# Patient Record
Sex: Male | Born: 1950 | Race: White | Hispanic: No | Smoking: Former smoker
Health system: Southern US, Community
[De-identification: ages and names within clinical notes are randomized; demographics above are authoritative.]

## PROBLEM LIST (undated history)

## (undated) DIAGNOSIS — F419 Anxiety disorder, unspecified: Secondary | ICD-10-CM

## (undated) DIAGNOSIS — M069 Rheumatoid arthritis, unspecified: Secondary | ICD-10-CM

## (undated) DIAGNOSIS — M5136 Other intervertebral disc degeneration, lumbar region: Secondary | ICD-10-CM

## (undated) DIAGNOSIS — E7800 Pure hypercholesterolemia, unspecified: Secondary | ICD-10-CM

## (undated) DIAGNOSIS — M170 Bilateral primary osteoarthritis of knee: Secondary | ICD-10-CM

## (undated) DIAGNOSIS — I4819 Other persistent atrial fibrillation: Secondary | ICD-10-CM

## (undated) DIAGNOSIS — C632 Malignant neoplasm of scrotum: Secondary | ICD-10-CM

## (undated) DIAGNOSIS — G4730 Sleep apnea, unspecified: Secondary | ICD-10-CM

## (undated) DIAGNOSIS — M19041 Primary osteoarthritis, right hand: Secondary | ICD-10-CM

## (undated) DIAGNOSIS — I10 Essential (primary) hypertension: Secondary | ICD-10-CM

## (undated) DIAGNOSIS — M19042 Primary osteoarthritis, left hand: Secondary | ICD-10-CM

## (undated) DIAGNOSIS — G4700 Insomnia, unspecified: Secondary | ICD-10-CM

## (undated) DIAGNOSIS — I482 Chronic atrial fibrillation, unspecified: Secondary | ICD-10-CM

## (undated) DIAGNOSIS — I4892 Unspecified atrial flutter: Secondary | ICD-10-CM

## (undated) HISTORY — DX: Primary osteoarthritis, right hand: M19.041

## (undated) HISTORY — DX: Insomnia, unspecified: G47.00

## (undated) HISTORY — DX: Pure hypercholesterolemia, unspecified: E78.00

## (undated) HISTORY — DX: Primary osteoarthritis, left hand: M19.042

## (undated) HISTORY — DX: Unspecified atrial flutter: I48.92

## (undated) HISTORY — PX: A-FLUTTER ABLATION: EP1230

## (undated) HISTORY — DX: Malignant neoplasm of scrotum: C63.2

## (undated) HISTORY — DX: Bilateral primary osteoarthritis of knee: M17.0

## (undated) HISTORY — DX: Anxiety disorder, unspecified: F41.9

## (undated) HISTORY — DX: Essential (primary) hypertension: I10

## (undated) HISTORY — DX: Sleep apnea, unspecified: G47.30

## (undated) HISTORY — DX: Other persistent atrial fibrillation: I48.19

## (undated) HISTORY — PX: CATARACT EXTRACTION: SUR2

## (undated) HISTORY — DX: Other intervertebral disc degeneration, lumbar region: M51.36

## (undated) HISTORY — DX: Rheumatoid arthritis, unspecified: M06.9

---

## 2016-01-25 DIAGNOSIS — I4892 Unspecified atrial flutter: Secondary | ICD-10-CM | POA: Insufficient documentation

## 2016-01-30 DIAGNOSIS — I517 Cardiomegaly: Secondary | ICD-10-CM | POA: Insufficient documentation

## 2016-01-30 DIAGNOSIS — E65 Localized adiposity: Secondary | ICD-10-CM | POA: Insufficient documentation

## 2016-02-05 ENCOUNTER — Telehealth: Payer: Self-pay | Admitting: Rheumatology

## 2016-02-05 NOTE — Telephone Encounter (Signed)
Pt needs a refill of Hydrocodone and would like to pick up tomorrow, 02/06/2016

## 2016-02-06 MED ORDER — HYDROCODONE-ACETAMINOPHEN 5-325 MG PO TABS: 1.0000 | ORAL_TABLET | Freq: Two times a day (BID) | ORAL | 0 refills | Status: DC | PRN

## 2016-02-06 NOTE — Telephone Encounter (Signed)
Last Visit: 08/14/15 Next Visit: 02/14/16  UDS:01/14/16 +hydrocodone, and benzos Narc Agreement: 09/03/15  Okay to refill Hydrocodone ?

## 2016-02-06 NOTE — Telephone Encounter (Signed)
Advised ready for pick up.

## 2016-02-06 NOTE — Telephone Encounter (Signed)
ok to refill Hydrodocone

## 2016-02-13 ENCOUNTER — Encounter: Payer: Self-pay | Admitting: *Deleted

## 2016-02-13 DIAGNOSIS — M170 Bilateral primary osteoarthritis of knee: Secondary | ICD-10-CM | POA: Insufficient documentation

## 2016-02-13 DIAGNOSIS — G4730 Sleep apnea, unspecified: Secondary | ICD-10-CM

## 2016-02-13 DIAGNOSIS — I10 Essential (primary) hypertension: Secondary | ICD-10-CM | POA: Insufficient documentation

## 2016-02-13 DIAGNOSIS — I4892 Unspecified atrial flutter: Secondary | ICD-10-CM

## 2016-02-13 DIAGNOSIS — M19042 Primary osteoarthritis, left hand: Secondary | ICD-10-CM

## 2016-02-13 DIAGNOSIS — C632 Malignant neoplasm of scrotum: Secondary | ICD-10-CM

## 2016-02-13 DIAGNOSIS — E782 Mixed hyperlipidemia: Secondary | ICD-10-CM

## 2016-02-13 DIAGNOSIS — M069 Rheumatoid arthritis, unspecified: Secondary | ICD-10-CM

## 2016-02-13 DIAGNOSIS — G4733 Obstructive sleep apnea (adult) (pediatric): Secondary | ICD-10-CM | POA: Insufficient documentation

## 2016-02-13 DIAGNOSIS — M51369 Other intervertebral disc degeneration, lumbar region without mention of lumbar back pain or lower extremity pain: Secondary | ICD-10-CM

## 2016-02-13 DIAGNOSIS — F419 Anxiety disorder, unspecified: Secondary | ICD-10-CM

## 2016-02-13 DIAGNOSIS — M19041 Primary osteoarthritis, right hand: Secondary | ICD-10-CM

## 2016-02-13 DIAGNOSIS — G4700 Insomnia, unspecified: Secondary | ICD-10-CM | POA: Insufficient documentation

## 2016-02-13 DIAGNOSIS — M5136 Other intervertebral disc degeneration, lumbar region: Secondary | ICD-10-CM

## 2016-02-13 DIAGNOSIS — I499 Cardiac arrhythmia, unspecified: Secondary | ICD-10-CM | POA: Insufficient documentation

## 2016-02-13 DIAGNOSIS — E7800 Pure hypercholesterolemia, unspecified: Secondary | ICD-10-CM

## 2016-02-13 HISTORY — DX: Sleep apnea, unspecified: G47.30

## 2016-02-13 HISTORY — DX: Rheumatoid arthritis, unspecified: M06.9

## 2016-02-13 HISTORY — DX: Malignant neoplasm of scrotum: C63.2

## 2016-02-13 HISTORY — DX: Mixed hyperlipidemia: E78.2

## 2016-02-13 HISTORY — DX: Other intervertebral disc degeneration, lumbar region: M51.36

## 2016-02-13 HISTORY — DX: Primary osteoarthritis, right hand: M19.041

## 2016-02-13 HISTORY — DX: Unspecified atrial flutter: I48.92

## 2016-02-13 HISTORY — DX: Insomnia, unspecified: G47.00

## 2016-02-13 HISTORY — DX: Anxiety disorder, unspecified: F41.9

## 2016-02-13 HISTORY — DX: Essential (primary) hypertension: I10

## 2016-02-13 HISTORY — DX: Other intervertebral disc degeneration, lumbar region without mention of lumbar back pain or lower extremity pain: M51.369

## 2016-02-13 NOTE — Progress Notes (Deleted)
*IMAGE* Office Visit Note  Patient: Jesus Rodriguez             Date of Birth: 1950/12/02           MRN: [id number]             PCP: No primary care provider on file. Referring: No ref. provider found Visit Date: 02/14/2016 Occupation:@GUAROCC @    Subjective:  No chief complaint on file. Follow-up on rheumatoid arthritis Osteoarthritis of the knee joint Smoking cessation Obesity   History of Present Illness: Jesus Rodriguez is a 65 y.o. male last seen in our office on 08/14/2015. On that visit his rheumatoid arthritis which was seronegative was well controlled without any DMARD's. He had to stop methotrexate because he was having arrhythmia with the medication. Patient is unable to use sulfasalazine because of allergies to sulfa. Despite this he had no synovitis. No joint pain to the hand or wrist joint. He also has history of osteoarthritis of the knee joint and we had given him Hyalgan that finished 12/15/2014 and was doing well. His knee pain was rated between 5-8 on a scale of 0-10 . Patient was interested in having a repeat series and so we gave him another series of injections in summer fall of 2017 and he did well with those. We also discussed as usual smoking cessation. Patient is trying. He is not quite successful.  Today the patient states***  Activities of Daily Living:  Patient reports morning stiffness for 15 minutes.   Patient Denies nocturnal pain.  Difficulty dressing/grooming: Denies Difficulty climbing stairs: Denies Difficulty getting out of chair: Reports Difficulty using hands for taps, buttons, cutlery, and/or writing: Denies   Review of Systems  Constitutional: Negative for fatigue.  HENT: Negative for mouth sores and mouth dryness.   Eyes: Negative for dryness.  Respiratory: Negative for shortness of breath.   Gastrointestinal: Negative for constipation and diarrhea.  Musculoskeletal: Negative for myalgias and myalgias.  Skin: Negative for sensitivity to  sunlight.  Neurological: Negative for memory loss.  Psychiatric/Behavioral: Negative for sleep disturbance.    PMFS History:  Patient Active Problem List   Diagnosis Date Noted  . High risk medications (not anticoagulants) long-term use 02/14/2016  . Class 3 obesity in adult 02/14/2016  . Depression 02/14/2016  . RA (rheumatoid arthritis) ([place]) 02/13/2016  . Osteoarthritis of both hands 02/13/2016  . Osteoarthritis of both knees 02/13/2016  . DDD (degenerative disc disease), lumbar 02/13/2016  . 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 Hypertension 02/13/2016  . Elevated cholesterol 02/13/2016  . Insomnia 02/13/2016  . Anxiety 02/13/2016  . Sleep apnea 02/13/2016  . Arrhythmia 02/13/2016    Past Medical History:  Diagnosis Date  . Anxiety 02/13/2016  . Arrhythmia 02/13/2016  . DDD (degenerative disc disease), lumbar 02/13/2016  . Elevated cholesterol 02/13/2016  . Hypertension 02/13/2016  . Insomnia 02/13/2016  . Osteoarthritis of both hands 02/13/2016  . Osteoarthritis of both knees 02/13/2016  . RA (rheumatoid arthritis) ([place]) 02/13/2016   Sero Negative  . Scrotal cancer ([place] Rodriguez) 02/13/2016  . Sleep apnea 02/13/2016    No family history on file. No past surgical history on file. Social History   Social History Narrative  . No narrative on file     Objective: Vital Signs: There were no vitals taken for this visit.   Physical Exam  Constitutional: He is oriented to person, place, and time. He appears well-developed and well-nourished.  HENT:  Head: Normocephalic and atraumatic.  Eyes: Conjunctivae and EOM are  normal. Pupils are equal, round, and reactive to light.  Neck: Normal range of motion. Neck supple.  Cardiovascular: Normal rate, regular rhythm and normal heart sounds.  Exam reveals no gallop and no friction rub.   No murmur heard. Pulmonary/Chest: Effort normal and breath sounds normal. No respiratory distress. He has no wheezes. He has no rales. He exhibits no  tenderness.  Abdominal: Soft. He exhibits no distension and no mass. There is no tenderness. There is no guarding.  Musculoskeletal: Normal range of motion.  Lymphadenopathy:    He has no cervical adenopathy.  Neurological: He is alert and oriented to person, place, and time. He exhibits normal muscle tone. Coordination normal.  Skin: Skin is warm and dry. Capillary refill takes less than 2 seconds. No rash noted.  Psychiatric: He has a normal mood and affect. His behavior is normal. Judgment and thought content normal.  Nursing note and vitals reviewed.    Musculoskeletal Exam:  Full range of motion of all joints Grip strength is equal and strong bilaterally Fibromyalgia tender points are all absent  CDAI Exam: No CDAI exam completed.  NO SYNOVITIS ON EXAM. No joint pain no joint swelling no joint stiffness. Continues to do well with his seronegative rheumatoid arthritis despite not being on any DMARD's.  He cannot take methotrexate due to history of arrhythmia with methotrexate. He cannot take sulfa since he has sulfa allergies. 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 Consider biologic in the future that includes Humira as an option should the need arise.  Investigation: No additional findings.   Imaging: No results found.  Speciality Comments: No specialty comments available.    Procedures:  No procedures performed Allergies: Celebrex [celecoxib]; Methotrexate derivatives; and Sulfa antibiotics   Assessment / Plan: Visit Diagnoses: Rheumatoid arthritis, involving unspecified site, unspecified rheumatoid factor presence ([place]) - Sero NegativeErosive  High risk medications (not anticoagulants) long-term use  Osteoarthritis of both knees, unspecified osteoarthritis type - Responded well to Hyalgan in Summer / Fall 2017 and previous years  Class 3 obesity in adult, unspecified BMI, unspecified obesity type, unspecified whether serious comorbidity  present  Sleep apnea, unspecified type  DDD (degenerative disc disease), lumbar  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 He does take hydrocodone on a regular basis that we prescribed. He has decreased his hydrocodone use based on our suggestion and I'm currently satisfied with his current dose of hydrocodone  Patient does not need any DMARD's at this time. His rheumatoid arthritis is doing well.  It will be time for repeat series of [place] and we will apply when appropriate.  We discussed again to continue the efforts for smoking cessation. If initially smokes a few cigarettes less overall between this visit at next visit that will be something positive.  He continues to make his efforts for decreasing his weight. I've encouraged him to continue weight loss efforts through proper nutrition proper exercise. Giving the knee injections and making his needs more comfortable is allowing him more mobility and allowing him more calorie burning activities secondary to mobility.  Orders: No orders of the defined types were placed in this encounter.  No orders of the defined types were placed in this encounter.   Face-to-face time spent with patient was 30 minutes. 50% of time was spent in counseling and coordination of care.  Follow-Up Instructions: No Follow-up on file.

## 2016-02-14 ENCOUNTER — Ambulatory Visit: Payer: Self-pay | Admitting: Rheumatology

## 2016-02-14 DIAGNOSIS — Z79899 Other long term (current) drug therapy: Secondary | ICD-10-CM | POA: Insufficient documentation

## 2016-02-14 DIAGNOSIS — F32A Depression, unspecified: Secondary | ICD-10-CM | POA: Insufficient documentation

## 2016-02-14 DIAGNOSIS — IMO0001 Reserved for inherently not codable concepts without codable children: Secondary | ICD-10-CM | POA: Insufficient documentation

## 2016-02-14 DIAGNOSIS — F329 Major depressive disorder, single episode, unspecified: Secondary | ICD-10-CM | POA: Insufficient documentation

## 2016-03-10 ENCOUNTER — Other Ambulatory Visit: Payer: Self-pay | Admitting: Rheumatology

## 2016-03-10 NOTE — Telephone Encounter (Signed)
What happened to his appt that was sch with [place] on 02/14/16?

## 2016-03-10 NOTE — Telephone Encounter (Signed)
Patient left message requesting refill of hydrocodone

## 2016-03-10 NOTE — Telephone Encounter (Signed)
Last Visit: 08/14/15 Next Visit: was sch on 02/14/16 and no showed this, he will need follow up appt  UDS:01/14/16 +hydrocodone, and benzos Narc Agreement: 09/03/15  Will have front desk call to make appt

## 2016-03-11 NOTE — Telephone Encounter (Signed)
Patient now has scheduled appointment for 03/10/16.

## 2016-03-12 MED ORDER — HYDROCODONE-ACETAMINOPHEN 5-325 MG PO TABS: 1.0000 | ORAL_TABLET | Freq: Two times a day (BID) | ORAL | 0 refills | Status: DC | PRN

## 2016-03-12 NOTE — Telephone Encounter (Signed)
Ok to refill Hydrocodone? He made appt for 04/09/16

## 2016-03-18 DIAGNOSIS — Z7901 Long term (current) use of anticoagulants: Secondary | ICD-10-CM | POA: Insufficient documentation

## 2016-04-09 ENCOUNTER — Ambulatory Visit: Payer: Self-pay | Admitting: Rheumatology

## 2016-04-11 DIAGNOSIS — I483 Typical atrial flutter: Secondary | ICD-10-CM | POA: Insufficient documentation

## 2016-04-17 ENCOUNTER — Encounter: Payer: Self-pay | Admitting: Rheumatology

## 2016-04-17 ENCOUNTER — Ambulatory Visit (INDEPENDENT_AMBULATORY_CARE_PROVIDER_SITE_OTHER): Payer: Medicare Other | Admitting: Rheumatology

## 2016-04-17 VITALS — BP 130/70 | HR 72 | Resp 18 | Ht 72.5 in | Wt 373.0 lb

## 2016-04-17 DIAGNOSIS — E669 Obesity, unspecified: Secondary | ICD-10-CM | POA: Diagnosis not present

## 2016-04-17 DIAGNOSIS — IMO0001 Reserved for inherently not codable concepts without codable children: Secondary | ICD-10-CM

## 2016-04-17 DIAGNOSIS — G8929 Other chronic pain: Secondary | ICD-10-CM | POA: Diagnosis not present

## 2016-04-17 DIAGNOSIS — Z7189 Other specified counseling: Secondary | ICD-10-CM | POA: Diagnosis not present

## 2016-04-17 DIAGNOSIS — M25562 Pain in left knee: Secondary | ICD-10-CM

## 2016-04-17 DIAGNOSIS — Z6841 Body Mass Index (BMI) 40.0 and over, adult: Secondary | ICD-10-CM | POA: Diagnosis not present

## 2016-04-17 DIAGNOSIS — M0609 Rheumatoid arthritis without rheumatoid factor, multiple sites: Secondary | ICD-10-CM

## 2016-04-17 DIAGNOSIS — M25561 Pain in right knee: Secondary | ICD-10-CM | POA: Diagnosis not present

## 2016-04-17 DIAGNOSIS — Z79899 Other long term (current) drug therapy: Secondary | ICD-10-CM

## 2016-04-17 MED ORDER — HYDROCODONE-ACETAMINOPHEN 5-325 MG PO TABS: 1.0000 | ORAL_TABLET | Freq: Two times a day (BID) | ORAL | 0 refills | Status: DC | PRN

## 2016-04-17 NOTE — Progress Notes (Signed)
Office Visit Note  Patient: Jesus Rodriguez             Date of Birth: November 12, 1950           MRN: [id number]             PCP: Pcp Not In System Referring: No ref. provider found Visit Date: 04/17/2016 Occupation: @GUAROCC @    Subjective:  No chief complaint on file. RA hx AND F-U  History of Present Illness: Jesus Rodriguez is a 66 y.o. male  Follow-up on rheumatoid arthritis. Patient is seronegative. Patient stop methotrexate because [facility] said that it can cause arrhythmia. Patient can't take sulfasalazine due to allergies to sulfa.  He's been doing well with his rheumatoid arthritis without any treatment.  Patient had heart issues and went to [facility] from his PCPs office for the following care starting 02/03/2016. And then multiple visits thereafter for the following: Most recently he had symptoms of feeling weak and heart rate went from about 50 on weight 150. He went to the hospital and they found that he had atrial flutter. They did cardioversion. He had 7 different cardioversions because he would not improve with the cardioversion treatments. He did end up doing A. fib at times when they were cardioverting him. He also underwent ablation  He's been put on 2 additional medications for his heart including sotalol. He is doing better now   Activities of Daily Living:  Patient reports morning stiffness for 30 minutes.   Patient Reports nocturnal pain.  Difficulty dressing/grooming: Denies Difficulty climbing stairs: Reports Difficulty getting out of chair: Reports Difficulty using hands for taps, buttons, cutlery, and/or writing: Reports   Review of Systems  Constitutional: Negative for fatigue.  HENT: Negative for mouth sores and mouth dryness.   Eyes: Negative for dryness.  Respiratory: Negative for shortness of breath.   Gastrointestinal: Negative for constipation and diarrhea.  Musculoskeletal: Negative for myalgias and myalgias.  Skin: Negative for  sensitivity to sunlight.  Neurological: Negative for memory loss.  Psychiatric/Behavioral: Negative for sleep disturbance.    PMFS History:  Patient Active Problem List   Diagnosis Date Noted  . High risk medications (not anticoagulants) long-term use 02/14/2016  . Class 3 obesity in adult 02/14/2016  . Depression 02/14/2016  . RA (rheumatoid arthritis) ([place]) 02/13/2016  . Osteoarthritis of both hands 02/13/2016  . Osteoarthritis of both knees 02/13/2016  . DDD (degenerative disc disease), lumbar 02/13/2016  . 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 Hypertension 02/13/2016  . Elevated cholesterol 02/13/2016  . Insomnia 02/13/2016  . Anxiety 02/13/2016  . Sleep apnea 02/13/2016  . Arrhythmia 02/13/2016    Past Medical History:  Diagnosis Date  . Anxiety 02/13/2016  . Arrhythmia 02/13/2016  . DDD (degenerative disc disease), lumbar 02/13/2016  . Elevated cholesterol 02/13/2016  . Hypertension 02/13/2016  . Insomnia 02/13/2016  . Osteoarthritis of both hands 02/13/2016  . Osteoarthritis of both knees 02/13/2016  . RA (rheumatoid arthritis) ([place]) 02/13/2016   Sero Negative  . 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 Sleep apnea 02/13/2016    No family history on file. No past surgical history on file. Social History   Social History Narrative  . No narrative on file     Objective: Vital Signs: BP 130/70   Pulse 72   Resp 18   Ht 6' 0.5" (1.842 m)   Wt (!) 373 lb (169.2 kg)   BMI 49.89 kg/m    Physical Exam  Constitutional: He is oriented to person,  place, and time. He appears well-developed and well-nourished.  HENT:  Head: Normocephalic and atraumatic.  Eyes: Conjunctivae and EOM are normal. Pupils are equal, round, and reactive to light.  Neck: Normal range of motion. Neck supple.  Cardiovascular: Normal rate, regular rhythm and normal heart sounds.  Exam reveals no gallop and no friction rub.   No murmur heard. Pulmonary/Chest: Effort normal and breath sounds normal. No  respiratory distress. He has no wheezes. He has no rales. He exhibits no tenderness.  Abdominal: Soft. He exhibits no distension and no mass. There is no tenderness. There is no guarding.  Musculoskeletal: Normal range of motion.  Lymphadenopathy:    He has no cervical adenopathy.  Neurological: He is alert and oriented to person, place, and time. He exhibits normal muscle tone. Coordination normal.  Skin: Skin is warm and dry. Capillary refill takes less than 2 seconds. No rash noted.  Psychiatric: He has a normal mood and affect. His behavior is normal. Judgment and thought content normal.  Nursing note and vitals reviewed.    Musculoskeletal Exam:  Full range of motion of all joints Grip strength is equal and strong bilaterally Fiber myalgia tender points are all absent  CDAI Exam: CDAI Homunculus Exam:   Joint Counts:  CDAI Tender Joint count: 0 CDAI Swollen Joint count: 0    No synovitis on examination  Investigation: No additional findings.   Imaging: No results found.  Speciality Comments: No specialty comments available.    Procedures:  No procedures performed Allergies: Celebrex [celecoxib]; Methotrexate derivatives; and Sulfa antibiotics   Assessment / Plan:     Visit Diagnoses: Rheumatoid arthritis, seronegative, multiple sites ([place])  High risk medications (not anticoagulants) long-term use - no treatment; doing well. - Plan: Pain Mgmt, Profile 5 w/Conf, U, COMPLETE METABOLIC PANEL WITH GFR  Class 3 obesity without serious comorbidity with body mass index (BMI) of 45.0 to 49.9 in adult, unspecified obesity type ([place])  History of participation in smoking cessation counseling   History of osteoarthritis of bilateral knees. Patient responded well to the last series of Hyalgan injections. He had 5 of them. When he presented we asked for reapplication/preauthorization for his knees. Unfortunately despite the patient as well as Miss Ivin Booty Caudle's best  effort to get the preauthorization they continuously got runaround for multiple months. Initially was rejected, then it was approved but for Euflexxa 3, then they raised the price on the patient where the medicine for Euflexxa would be $150 versus the medicine for Hyalgan which they denied was $50. Patient became frustrated and just decided not to do the injections. However, today, patient is requesting to restart the series because his knees really are feeling bad and he knows how much it helped him in the past. Note that patient had Hyalgan to both of his knees 5 12/15/2014. His pain was 8 before he started it and then it went down to a 5 at which time in May 2017 we reapplied. Currently the pain in the left knee is a 10 and in the other needs about 8 or 9.  I'm hopeful that the insurance company will help the patient get the best medical care that he deserves and I hope that they approve his medications without causing him any unnecessary delays.  Apply for Euflexxa 3 bilateral knees but Hyalgan is prefer and if they approve that at a proper price I would like to give that to the patient since he is already done well with that medication. Note  that patient has lost significant weight since his previous visit: He is 373 pounds now. He was 425 in October 2017.   uds ===> Patient's last urine drug screen was October 2017 and he will need to repeat one 6 months from that date which will be about April 2018. I will also check his liver functions by ordering a CMP with   Refill hydrocodone 30 day supply. Note patient used to be on 3 tablets per day but he has cut back to 2 per day. While he was hospitalized at [facility] for his atrial flutter, he was off of hydrocodone entirely. He stated that they put him on Tylenol. I've encouraged the patient to alternate between hydrocodone and Tylenol and make every effort possible to see if he can decrease his overall hydrocodone use. Patient states that he  has tried his best but he will make further effort.  Smoking cessation discussed with patient at length. He will continue to make an effort.  Orders: Orders Placed This Encounter  Procedures  . Pain Mgmt, Profile 5 w/Conf, U  . COMPLETE METABOLIC PANEL WITH GFR   Meds ordered this encounter  Medications  . HYDROcodone-acetaminophen (NORCO/VICODIN) 5-325 MG tablet    Sig: Take 1 tablet by mouth 2 (two) times daily as needed for moderate pain.    Dispense:  60 tablet    Refill:  0    Order Specific Question:   Supervising Provider    Answer:   Bo Merino [phone]    Face-to-face time spent with patient was 30 minutes. 50% of time was spent in counseling and coordination of care.  Follow-Up Instructions: Return in about 6 months (around 10/15/2016) for ra (sero-),no tx, obesity, oakj,sm cess,.   Eliezer Lofts, PA-C  Note - This record has been created using [employer].  Chart creation errors have been sought, but may not always  have been located. Such creation errors do not reflect on  the standard of medical care.

## 2016-05-19 ENCOUNTER — Other Ambulatory Visit: Payer: Self-pay | Admitting: Rheumatology

## 2016-05-19 MED ORDER — HYDROCODONE-ACETAMINOPHEN 5-325 MG PO TABS: 1.0000 | ORAL_TABLET | Freq: Two times a day (BID) | ORAL | 0 refills | Status: DC | PRN

## 2016-05-19 NOTE — Telephone Encounter (Signed)
Last Visit: 04/17/16 Next Visit: 10/16/16 UDS: 01/2016 Narc Agreement: 08/27/2015  Okay to refill Hydrocodone?  

## 2016-05-19 NOTE — Telephone Encounter (Signed)
Patient needs a refill of Hydrocodone

## 2016-06-17 ENCOUNTER — Other Ambulatory Visit (INDEPENDENT_AMBULATORY_CARE_PROVIDER_SITE_OTHER): Payer: Self-pay | Admitting: Rheumatology

## 2016-06-17 MED ORDER — HYDROCODONE-ACETAMINOPHEN 5-325 MG PO TABS: 1.0000 | ORAL_TABLET | Freq: Two times a day (BID) | ORAL | 0 refills | Status: DC | PRN

## 2016-06-17 NOTE — Telephone Encounter (Signed)
Patient called requesting a refill on his hydrocodone.  Cb#[phone].  Thank you.

## 2016-06-17 NOTE — Telephone Encounter (Signed)
Last Visit: 04/17/16 Next Visit: 10/16/16 UDS: 01/2016 Narc Agreement: 08/27/2015  Okay to refill Hydrocodone?  

## 2016-07-18 ENCOUNTER — Other Ambulatory Visit (INDEPENDENT_AMBULATORY_CARE_PROVIDER_SITE_OTHER): Payer: Self-pay | Admitting: Rheumatology

## 2016-07-18 MED ORDER — HYDROCODONE-ACETAMINOPHEN 5-325 MG PO TABS: 1.0000 | ORAL_TABLET | Freq: Two times a day (BID) | ORAL | 0 refills | Status: DC | PRN

## 2016-07-18 NOTE — Telephone Encounter (Signed)
Last Visit: 04/17/16 Next Visit: 10/16/16 UDS: 01/2016 Narc Agreement: 08/27/2015  Okay to refill Hydrocodone?

## 2016-07-18 NOTE — Telephone Encounter (Signed)
Pt called asking for refill of Hydrocodone [phone]

## 2016-07-18 NOTE — Addendum Note (Signed)
Addended by: Carole Binning on: 07/18/2016 03:50 PM   Modules accepted: Orders

## 2016-08-19 ENCOUNTER — Other Ambulatory Visit: Payer: Self-pay | Admitting: Rheumatology

## 2016-08-19 MED ORDER — HYDROCODONE-ACETAMINOPHEN 5-325 MG PO TABS: 1.0000 | ORAL_TABLET | Freq: Two times a day (BID) | ORAL | 0 refills | Status: DC | PRN

## 2016-08-19 NOTE — Telephone Encounter (Signed)
Patient called to request rf on his Hydrocodone. Please call when ready.

## 2016-08-19 NOTE — Telephone Encounter (Signed)
Last Visit: 04/17/16 Next Visit: 10/16/16 UDS: 01/2016 Narc Agreement: 08/27/2015 Patient will come to office to update narcotic agreement and UDS  Okay to refill Hydrocodone?

## 2016-08-20 ENCOUNTER — Other Ambulatory Visit: Payer: Self-pay

## 2016-08-20 DIAGNOSIS — Z79899 Other long term (current) drug therapy: Secondary | ICD-10-CM

## 2016-08-20 LAB — COMPLETE METABOLIC PANEL WITH GFR
ALT: 14 U/L (ref 9–46)
AST: 15 U/L (ref 10–35)
Albumin: 4.1 g/dL (ref 3.6–5.1)
Alkaline Phosphatase: 86 U/L (ref 40–115)
BUN: 19 mg/dL (ref 7–25)
CO2: 27 mmol/L (ref 20–31)
Calcium: 9.5 mg/dL (ref 8.6–10.3)
Chloride: 103 mmol/L (ref 98–110)
Creat: 0.95 mg/dL (ref 0.70–1.25)
GFR, Est African American: >89 mL/min (ref >=60)
GFR, Est Non African American: 83 mL/min (ref >=60)
Glucose, Bld: 173 mg/dL — ABNORMAL HIGH (ref 65–99)
Potassium: 4.5 mmol/L (ref 3.5–5.3)
Sodium: 141 mmol/L (ref 135–146)
Total Bilirubin: 0.4 mg/dL (ref 0.2–1.2)
Total Protein: 7 g/dL (ref 6.1–8.1)

## 2016-08-23 LAB — PAIN MGMT, PROFILE 5 W/CONF, U
Amphetamines: NEGATIVE ng/mL (ref <500)
Barbiturates: NEGATIVE ng/mL (ref <300)
Benzodiazepines: NEGATIVE ng/mL (ref <100)
Cocaine Metabolite: NEGATIVE ng/mL (ref <150)
Codeine: NEGATIVE ng/mL (ref <50)
Creatinine: 159 mg/dL (ref >=20.0)
Hydrocodone: 967 ng/mL — ABNORMAL HIGH (ref <50)
Hydromorphone: 175 ng/mL — ABNORMAL HIGH (ref <50)
Marijuana Metabolite: NEGATIVE ng/mL (ref <20)
Methadone Metabolite: NEGATIVE ng/mL (ref <100)
Morphine: NEGATIVE ng/mL (ref <50)
Norhydrocodone: 890 ng/mL — ABNORMAL HIGH (ref <50)
Opiates: POSITIVE ng/mL — AB (ref <100)
Oxidant: NEGATIVE ug/mL (ref <200)
Oxycodone: NEGATIVE ng/mL (ref <100)
pH: 5.8 (ref 4.5–9.0)

## 2016-08-25 NOTE — Progress Notes (Signed)
C/w

## 2016-09-02 ENCOUNTER — Telehealth (INDEPENDENT_AMBULATORY_CARE_PROVIDER_SITE_OTHER): Payer: Self-pay

## 2016-09-02 NOTE — Telephone Encounter (Signed)
Faxed Euflexxa application to [phone].  Awaiting a response.

## 2016-09-18 ENCOUNTER — Other Ambulatory Visit: Payer: Self-pay | Admitting: Rheumatology

## 2016-09-18 MED ORDER — HYDROCODONE-ACETAMINOPHEN 5-325 MG PO TABS: 1.0000 | ORAL_TABLET | Freq: Two times a day (BID) | ORAL | 0 refills | Status: DC | PRN

## 2016-09-18 NOTE — Telephone Encounter (Signed)
Patient called requesting a refill on his Hydrocodone.  CB#[phone].  Thank you.

## 2016-09-18 NOTE — Telephone Encounter (Signed)
Last Visit: 04/17/16 Next Visit: 10/16/16 UDS: 08/20/16 Narc Agreement: 08/20/16  Okay to refill Hydrocodone?

## 2016-10-07 ENCOUNTER — Encounter: Payer: Self-pay | Admitting: Rheumatology

## 2016-10-07 ENCOUNTER — Telehealth: Payer: Self-pay | Admitting: Radiology

## 2016-10-07 ENCOUNTER — Ambulatory Visit (INDEPENDENT_AMBULATORY_CARE_PROVIDER_SITE_OTHER): Payer: Medicare Other | Admitting: Rheumatology

## 2016-10-07 VITALS — BP 138/86 | HR 64 | Resp 18 | Ht 73.0 in | Wt 373.0 lb

## 2016-10-07 DIAGNOSIS — Z6841 Body Mass Index (BMI) 40.0 and over, adult: Secondary | ICD-10-CM | POA: Diagnosis not present

## 2016-10-07 DIAGNOSIS — Z79899 Other long term (current) drug therapy: Secondary | ICD-10-CM | POA: Diagnosis not present

## 2016-10-07 DIAGNOSIS — E669 Obesity, unspecified: Secondary | ICD-10-CM

## 2016-10-07 DIAGNOSIS — G8929 Other chronic pain: Secondary | ICD-10-CM

## 2016-10-07 DIAGNOSIS — M25562 Pain in left knee: Secondary | ICD-10-CM | POA: Diagnosis not present

## 2016-10-07 DIAGNOSIS — Z7189 Other specified counseling: Secondary | ICD-10-CM

## 2016-10-07 DIAGNOSIS — M0609 Rheumatoid arthritis without rheumatoid factor, multiple sites: Secondary | ICD-10-CM | POA: Diagnosis not present

## 2016-10-07 DIAGNOSIS — IMO0001 Reserved for inherently not codable concepts without codable children: Secondary | ICD-10-CM

## 2016-10-07 DIAGNOSIS — M25561 Pain in right knee: Secondary | ICD-10-CM

## 2016-10-07 NOTE — Telephone Encounter (Signed)
Called patient to advise him about Euflexxa Injection, but no answer.  I will try again to call patient to let him know that I am waiting on a fax from Patient enrollment concerning his benefits for Euflexxa Injection.

## 2016-10-07 NOTE — Telephone Encounter (Signed)
April, are you working on the approval for visco? I see a note you faxed this in May, but nothing after. Can you call patient to let him know status ?

## 2016-10-07 NOTE — Progress Notes (Signed)
Office Visit Note  Patient: Jesus Rodriguez             Date of Birth: Jun 20, 1950           MRN: [id number]             PCP: Macon Large, MD Referring: No ref. provider found Visit Date: 10/07/2016 Occupation: @GUAROCC @    Subjective:  Medication Management   History of Present Illness: Jesus Rodriguez is a 65 y.o. male  Last seen 04/17/2016 for seronegative rheumatoid arthritis. Please see January 2018 visit note for full details. Summary of that HPI is as follows ==> Patient was referred to [facility] by his PCP approximately 02/03/2016. He had symptoms of feeling weak with a heart rate of 50 - 150; was diagnosed with atrial flutter; did cardioversion; and when he did not improve with the first cardioversion, he had 7 different total cardioversions; patient ended up having A. fib at times while being cardioverted. He had to have cardio- ablation.  He was also given medication for his heart issues including sotalol. Patient reported that he was doing better after this treatment.  As a result of all of the above events with patient's heart, he was advised by [facility] to discontinue his methotrexate.  Today, patient states that he is doing well overall. His rheumatoid arthritis is not flaring. He's now been off of methotrexate since last 7 months. He does have some aches and pains but that may be consistent with osteoarthritis  Activities of Daily Living:  Patient reports morning stiffness for 30-60 minutes.   Patient Denies nocturnal pain.  Difficulty dressing/grooming: Denies Difficulty climbing stairs: reports Difficulty getting out of chair: Denies Difficulty using hands for taps, buttons, cutlery, and/or writing: Reports   Review of Systems  Constitutional: Negative for fatigue.  HENT: Negative for mouth sores and mouth dryness.   Eyes: Negative for dryness.  Respiratory: Negative for shortness of breath.   Gastrointestinal: Negative for constipation and diarrhea.    Musculoskeletal: Negative for myalgias and myalgias.  Skin: Negative for sensitivity to sunlight.  Neurological: Negative for memory loss.  Psychiatric/Behavioral: Negative for sleep disturbance.    PMFS History:  Patient Active Problem List   Diagnosis Date Noted  . High risk medications (not anticoagulants) long-term use 02/14/2016  . Class 3 obesity in adult 02/14/2016  . Depression 02/14/2016  . RA (rheumatoid arthritis) ([place]) 02/13/2016  . Osteoarthritis of both hands 02/13/2016  . Osteoarthritis of both knees 02/13/2016  . DDD (degenerative disc disease), lumbar 02/13/2016  . 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 Hypertension 02/13/2016  . Elevated cholesterol 02/13/2016  . Insomnia 02/13/2016  . Anxiety 02/13/2016  . Sleep apnea 02/13/2016  . Arrhythmia 02/13/2016    Past Medical History:  Diagnosis Date  . Anxiety 02/13/2016  . Arrhythmia 02/13/2016  . DDD (degenerative disc disease), lumbar 02/13/2016  . Elevated cholesterol 02/13/2016  . Hypertension 02/13/2016  . Insomnia 02/13/2016  . Osteoarthritis of both hands 02/13/2016  . Osteoarthritis of both knees 02/13/2016  . RA (rheumatoid arthritis) ([place]) 02/13/2016   Sero Negative  . 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 Sleep apnea 02/13/2016    No family history on file. Past Surgical History:  Procedure Laterality Date  . A-FLUTTER ABLATION     at [facility] 2018   Social History   Social History Narrative  . No narrative on file     Objective: Vital Signs: BP 138/86   Pulse 64   Resp 18  Ht 6\' 1"  (1.854 m)   Wt (!) 373 lb (169.2 kg)   BMI 49.21 kg/m    Physical Exam  Constitutional: He is oriented to person, place, and time. He appears well-developed and well-nourished.  HENT:  Head: Normocephalic and atraumatic.  Eyes: Conjunctivae and EOM are normal. Pupils are equal, round, and reactive to light.  Neck: Normal range of motion. Neck supple.  Cardiovascular: Normal rate, regular rhythm and normal heart  sounds.  Exam reveals no gallop and no friction rub.   No murmur heard. Pulmonary/Chest: Effort normal and breath sounds normal. No respiratory distress. He has no wheezes. He has no rales. He exhibits no tenderness.  Abdominal: Soft. He exhibits no distension and no mass. There is no tenderness. There is no guarding.  Musculoskeletal: Normal range of motion.  Lymphadenopathy:    He has no cervical adenopathy.  Neurological: He is alert and oriented to person, place, and time. He exhibits normal muscle tone. Coordination normal.  Skin: Skin is warm and dry. Capillary refill takes less than 2 seconds. No rash noted.  Psychiatric: He has a normal mood and affect. His behavior is normal. Judgment and thought content normal.  Vitals reviewed.    Musculoskeletal Exam:  Full range of motion of all joints Grip strength is equal and strong bilaterally Fibromyalgia tender points are absent  CDAI Exam: CDAI Homunculus Exam:   Joint Counts:  CDAI Tender Joint count: 0 CDAI Swollen Joint count: 0   Difficulty telling this is true synovitis or just swelling. We will monitor and see if there is a need to restart methotrexate in the future.  Investigation: No additional findings.  Orders Only on 08/20/2016  Component Date Value Ref Range Status  . Prescribed Drug 1 08/20/2016 Hydrocodone   Final  . Creatinine 08/20/2016 159.0  > or = 20.0 mg/dL Final  . pH 08/20/2016 5.80  4.5 - 9.0 Final  . Oxidant 08/20/2016 NEGATIVE  <200 mcg/mL Final  . Amphetamines 08/20/2016 NEGATIVE  <500 ng/mL Final  . medMATCH Amphetamines 08/20/2016 CONSISTENT   Final  . Barbiturates 08/20/2016 NEGATIVE  <300 ng/mL Final  . medMATCH Barbiturates 08/20/2016 CONSISTENT   Final  . Benzodiazepines 08/20/2016 NEGATIVE  <100 ng/mL Final  . medMATCH Benzodiazepines 08/20/2016 CONSISTENT   Final  . Marijuana Metabolite 08/20/2016 NEGATIVE  <20 ng/mL Final  . medMATCH Marijuana Metab 08/20/2016 CONSISTENT   Final  .  Cocaine Metabolite 08/20/2016 NEGATIVE  <150 ng/mL Final  . medMATCH Cocaine Metab 08/20/2016 CONSISTENT   Final  . Methadone Metabolite 08/20/2016 NEGATIVE  <100 ng/mL Final  . [facility] Methadone Metab 08/20/2016 CONSISTENT   Final  . Opiates 08/20/2016 POSITIVE* <100 ng/mL Final  . Codeine 08/20/2016 NEGATIVE  <50 ng/mL Final  . medMATCH Codeine 08/20/2016 CONSISTENT   Final  . Hydrocodone 08/20/2016 967* <50 ng/mL Final  . medMATCH Hydrocodone 08/20/2016 CONSISTENT   Final  . Hydromorphone 08/20/2016 175* <50 ng/mL Final  . medMATCH Hydromorphone 08/20/2016 CONSISTENT   Final   Norhydrocodone is a metabolite of Hydrocodone.  . Morphine 08/20/2016 NEGATIVE  <50 ng/mL Final  . medMATCH Morphine 08/20/2016 CONSISTENT   Final  . Norhydrocodone 08/20/2016 890* <50 ng/mL Final  . medMATCH Norhydrocodone 08/20/2016 CONSISTENT   Final   Norhydrocodone is a metabolite of Hydrocodone.  . Oxycodone 08/20/2016 NEGATIVE  <100 ng/mL Final  . medMATCH Oxycodone 08/20/2016 CONSISTENT   Final  . See Note: 08/20/2016     Final   Comment: This drug testing is for medical  treatment only.   Analysis was performed as non-forensic testing and  these results should be used only by healthcare  providers to render diagnosis or treatment, or to  monitor progress of medical conditions.   [facility] comments are:  - present when drug test results may be the result of     metabolism of one or more drugs or when results are     inconsistent with prescribed medication(s) listed.  - may be blank when drug results are consistent with     prescribed medication(s) listed.   For assistance with interpreting these drug results,  please contact a [employer] Toxicology  Specialist: [phone] [place] [phone]), M-F,  8am-6pm EST.   This drug testing is for medical treatment only. Analysis was performed as non-forensic testing and these results should be used only by healthcare providers to render  diagnosis or treatment, or to monitor progress of medical conditions.   For assistance with interpreting these dr                          ug results, please contact a [employer] Toxicology Specialist: [phone] [place] [phone]), M-F, 8am-6pm EST.     Marland Kitchen Sodium 08/20/2016 141  135 - 146 mmol/L Final  . Potassium 08/20/2016 4.5  3.5 - 5.3 mmol/L Final  . Chloride 08/20/2016 103  98 - 110 mmol/L Final  . CO2 08/20/2016 27  20 - 31 mmol/L Final  . Glucose, Bld 08/20/2016 173* 65 - 99 mg/dL Final  . BUN 08/20/2016 19  7 - 25 mg/dL Final  . Creat 08/20/2016 0.95  0.70 - 1.25 mg/dL Final   Comment:   For patients > or = 66 years of age: The upper reference limit for Creatinine is approximately 13% higher for people identified as African-American.     . Total Bilirubin 08/20/2016 0.4  0.2 - 1.2 mg/dL Final  . Alkaline Phosphatase 08/20/2016 86  40 - 115 U/L Final  . AST 08/20/2016 15  10 - 35 U/L Final  . ALT 08/20/2016 14  9 - 46 U/L Final  . Total Protein 08/20/2016 7.0  6.1 - 8.1 g/dL Final  . Albumin 08/20/2016 4.1  3.6 - 5.1 g/dL Final  . Calcium 08/20/2016 9.5  8.6 - 10.3 mg/dL Final  . GFR, Est African American 08/20/2016 >89  >=60 mL/min Final  . GFR, Est Non African American 08/20/2016 83  >=60 mL/min Final     Imaging: No results found.  Speciality Comments: No specialty comments available.    Procedures:  No procedures performed Allergies: Celebrex [celecoxib]; Methotrexate derivatives; and Sulfa antibiotics   Assessment / Plan:     Visit Diagnoses: Rheumatoid arthritis, seronegative, multiple sites ([place])  High risk medications (not anticoagulants) long-term use  Class 3 obesity without serious comorbidity with body mass index (BMI) of 45.0 to 49.9 in adult, unspecified obesity type ([place])  History of participation in smoking cessation counseling - working on it.  Bilateral chronic knee pain    Plan: #1: Rheumatoid arthritis,  seronegative. Patient has been off of methotrexate now since the last 7 months or so after being taken off by [facility]. At that time, he was being evaluated for heart issues (consistent with atrial flutter and on occasion A. fib.). He continues to do well with his joints despite being off of methotrexate.  Currently, it's difficult to say if it's true synovitis or swelling from previous damage. We will monitor patient and see  if he needs to restart methotrexate at a future visit. Patient knows to call us in case those joints get worse  #2:  High risk medications (not anticoagulants) long-term use - no treatment; doing well.   #3: Class 3 obesity without serious comorbidity with body mass index (BMI) of 45.0 to 49.9 in adult, unspecified obesity type ([place])  #4: History of osteoarthritis to bilateral knees. Please see last note for full details. Patient has responded well to Visco supplementation in the past. At the last visit, it was noted that there was a delay in getting Visco supplementation. Patient become frustrated and decided not to do Visco supplementation but then at the last visit, he was having significant pain and asked [place] to reapply for Visco supplementation. Hyalgan was preferred but Orthovisc or Euflex are acceptable if that is what the insurance probably will improve. We are hopeful that patient will get Visco supplementation approved by the insurance probably.  #5:Patient has been waiting for some time for visco injections approval  Patient has not heard anything since the second request for Visco supplementation.  Patient requested me to get an update from our office on the status of those injections and he would like to have a phone call Home ==> [phone]  According to patient, Jesus Rodriguez was able to come and speak with him and updated him on the status of the Visco supplementation Jesus Rodriguez was on vacation and out of the office).    Orders: No orders of the defined  types were placed in this encounter.  No orders of the defined types were placed in this encounter.   Face-to-face time spent with patient was 30 minutes. 50% of time was spent in counseling and coordination of care.  Follow-Up Instructions: Return in about 5 months (around 03/09/2017) for RA,.   Eliezer Lofts, PA-C  Patient has no synovitis on examination today. His arthritis seems to be in remission. I do not see any need for him to restart DMARD's. He continues to have normal discomfort in his knee joints. Weight loss diet and exercise was discussed. We will also check his status of Visco supplement injections. I examined and evaluated the patient with Eliezer Lofts PA. The plan of care was discussed as noted above.  Bo Merino, MD Note - This record has been created using Editor, commissioning.  Chart creation errors have been sought, but may not always  have been located. Such creation errors do not reflect on  the standard of medical care.

## 2016-10-07 NOTE — Telephone Encounter (Signed)
I will call patient enrollment for Euflexxa to check the status.  I haven't received any information since form was faxed.  I will call pateint as well to inform him of status.  Thank You.

## 2016-10-13 NOTE — Telephone Encounter (Signed)
Talked with patient and advised him of message concerning Euflexxa injection.  Advised patient that I will call back when we receive that information.

## 2016-10-14 ENCOUNTER — Telehealth (INDEPENDENT_AMBULATORY_CARE_PROVIDER_SITE_OTHER): Payer: Self-pay

## 2016-10-14 NOTE — Telephone Encounter (Signed)
Talked with patient and advised him of message concerning Euflexxa injection.  Advised patient that I will call back when we receive that information

## 2016-10-16 ENCOUNTER — Ambulatory Visit: Payer: Medicare Other | Admitting: Rheumatology

## 2016-10-21 ENCOUNTER — Other Ambulatory Visit: Payer: Self-pay | Admitting: Rheumatology

## 2016-10-21 MED ORDER — HYDROCODONE-ACETAMINOPHEN 5-325 MG PO TABS: 1.0000 | ORAL_TABLET | Freq: Two times a day (BID) | ORAL | 0 refills | Status: DC | PRN

## 2016-10-21 NOTE — Telephone Encounter (Signed)
ok 

## 2016-10-21 NOTE — Telephone Encounter (Signed)
Last Visit: 10/07/16 Next Visit: 03/12/17 /UDS: 08/20/16 Narc Agreement: 08/20/16  Okay to refill Hydrocodone?

## 2016-10-21 NOTE — Telephone Encounter (Signed)
Patient request a refill on his Hydrocodone. Please call patient when RX ready to pick up.

## 2016-11-21 ENCOUNTER — Other Ambulatory Visit: Payer: Self-pay | Admitting: Rheumatology

## 2016-11-21 MED ORDER — HYDROCODONE-ACETAMINOPHEN 5-325 MG PO TABS: 1.0000 | ORAL_TABLET | Freq: Two times a day (BID) | ORAL | 0 refills | Status: DC | PRN

## 2016-11-21 NOTE — Telephone Encounter (Signed)
ok 

## 2016-11-21 NOTE — Telephone Encounter (Signed)
Last Visit: 10/07/16 Next Visit: 03/12/17 UDS: 08/20/16 Narc Agreement: 08/20/16 Last Fill: 10/21/16  Okay to refill Hydrocodone?

## 2016-11-21 NOTE — Telephone Encounter (Signed)
Patient needs a refill on Hydrocodone. Plese call to advise.

## 2016-12-29 ENCOUNTER — Other Ambulatory Visit: Payer: Self-pay | Admitting: Rheumatology

## 2016-12-29 MED ORDER — HYDROCODONE-ACETAMINOPHEN 5-325 MG PO TABS: 1.0000 | ORAL_TABLET | Freq: Two times a day (BID) | ORAL | 0 refills | Status: DC | PRN

## 2016-12-29 NOTE — Telephone Encounter (Signed)
Last Visit: 10/07/16 Next Visit: 03/12/17 UDS: 08/20/16 Narc Agreement: 08/20/16 Last Fill: 11/21/16  Okay to refill Hydrocodone?

## 2016-12-29 NOTE — Telephone Encounter (Signed)
Advised patient on first phone call and for a second time now, that the rx cannot be faxed. It has to be picked up. Patient voiced understanding.
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
## 2016-12-29 NOTE — Telephone Encounter (Signed)
ok 

## 2017-01-23 ENCOUNTER — Telehealth: Payer: Self-pay

## 2017-01-23 NOTE — Telephone Encounter (Signed)
Error

## 2017-01-28 ENCOUNTER — Other Ambulatory Visit: Payer: Self-pay

## 2017-01-28 NOTE — Telephone Encounter (Signed)
Last Visit: 10/07/16 Next Visit: 03/12/17 UDS: 08/20/16 Narc Agreement: 08/20/16 Last Fill: 11/21/16  Okay to refill Hydrocodone?

## 2017-01-28 NOTE — Telephone Encounter (Signed)
Patient would like a Rx refill on Hydrocodone.  Cb#  [phone].  Thank You.

## 2017-01-28 NOTE — Telephone Encounter (Signed)
ok 

## 2017-01-29 ENCOUNTER — Telehealth: Payer: Self-pay

## 2017-01-29 MED ORDER — HYDROCODONE-ACETAMINOPHEN 5-325 MG PO TABS: 1.0000 | ORAL_TABLET | Freq: Two times a day (BID) | ORAL | 0 refills | Status: DC | PRN

## 2017-01-29 NOTE — Telephone Encounter (Signed)
Talked with patient and advised him that Rx is ready to be picked up at the front desk.

## 2017-03-02 ENCOUNTER — Telehealth: Payer: Self-pay | Admitting: Rheumatology

## 2017-03-02 MED ORDER — HYDROCODONE-ACETAMINOPHEN 5-325 MG PO TABS: 1.0000 | ORAL_TABLET | Freq: Two times a day (BID) | ORAL | 0 refills | Status: DC | PRN

## 2017-03-02 NOTE — Telephone Encounter (Signed)
Last Visit: 10/07/16 Next Visit: 03/12/17 UDS: 08/20/16 Narc Agreement: 08/20/16  Patient will update UDS when he picks up prescription.   Okay to refill per Dr. Estanislado Pandy

## 2017-03-02 NOTE — Telephone Encounter (Signed)
Patient requesting a refill on Hydrocodone. Please call when ready to pick up.

## 2017-03-03 ENCOUNTER — Other Ambulatory Visit: Payer: Self-pay | Admitting: *Deleted

## 2017-03-03 DIAGNOSIS — Z5181 Encounter for therapeutic drug level monitoring: Secondary | ICD-10-CM

## 2017-03-03 NOTE — Progress Notes (Signed)
Office Visit Note  Patient: Jesus Rodriguez             Date of Birth: [birth year]           MRN: [id number]             PCP: Macon Large, MD Referring: Macon Large, MD Visit Date: 03/12/2017 Occupation: @GUAROCC @    Subjective:  Arthritis (joint pain and stiffness )   History of Present Illness: Lavar Rosenzweig is a 66 y.o. male with history of sero positive rheumatoid arthritis and osteoarthritis. He states he's been off methotrexate for a long time after his cardiology evaluation about 5 years ago. He's been having some discomfort in his bilateral shoulders and bilateral knees and lower back. He does have known history of disc disease of lumbar spine. He denies any joint swelling.  Activities of Daily Living:  Patient reports morning stiffness for all day  hours.   Patient Denies nocturnal pain.  Difficulty dressing/grooming: Denies Difficulty climbing stairs: Reports Difficulty getting out of chair: Reports Difficulty using hands for taps, buttons, cutlery, and/or writing: Denies   Review of Systems  Constitutional: Positive for fatigue. Negative for night sweats and weakness ( ).  HENT: Negative for mouth sores, mouth dryness and nose dryness.   Eyes: Negative for redness and dryness.  Respiratory: Negative for shortness of breath and difficulty breathing.   Cardiovascular: Negative for chest pain, palpitations, hypertension, irregular heartbeat and swelling in legs/feet.  Gastrointestinal: Negative for constipation and diarrhea.  Endocrine: Negative for increased urination.  Musculoskeletal: Positive for arthralgias, joint pain and morning stiffness. Negative for joint swelling, myalgias, muscle weakness, muscle tenderness and myalgias.  Skin: Negative for color change, rash, hair loss, nodules/bumps, skin tightness, ulcers and sensitivity to sunlight.  Allergic/Immunologic: Negative for susceptible to infections.  Neurological: Negative for dizziness, fainting, memory  loss and night sweats.  Hematological: Negative for swollen glands.  Psychiatric/Behavioral: Positive for sleep disturbance. Negative for depressed mood. The patient is nervous/anxious.     PMFS History:  Patient Active Problem List   Diagnosis Date Noted  . High risk medications (not anticoagulants) long-term use 02/14/2016  . Class 3 obesity in adult 02/14/2016  . Depression 02/14/2016  . RA (rheumatoid arthritis) ([place]) 02/13/2016  . Osteoarthritis of both hands 02/13/2016  . Osteoarthritis of both knees 02/13/2016  . DDD (degenerative disc disease), lumbar 02/13/2016  . 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 Hypertension 02/13/2016  . Elevated cholesterol 02/13/2016  . Insomnia 02/13/2016  . Anxiety 02/13/2016  . Sleep apnea 02/13/2016  . Arrhythmia 02/13/2016    Past Medical History:  Diagnosis Date  . Anxiety 02/13/2016  . Arrhythmia 02/13/2016  . DDD (degenerative disc disease), lumbar 02/13/2016  . Elevated cholesterol 02/13/2016  . Hypertension 02/13/2016  . Insomnia 02/13/2016  . Osteoarthritis of both hands 02/13/2016  . Osteoarthritis of both knees 02/13/2016  . RA (rheumatoid arthritis) ([place]) 02/13/2016   Sero Negative  . 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 Sleep apnea 02/13/2016    Family History  Problem Relation Age of Onset  . Hypertension Father   . Heart attack Father   . Cancer Father        prostate  . Hypertension Sister   . Hypertension Sister   . Hypertension Sister   . Epilepsy Son   . Diabetes Son    Past Surgical History:  Procedure Laterality Date  . A-FLUTTER ABLATION     at [facility] 2018   Social History  Social History Narrative  . Not on file     Objective: Vital Signs: BP (!) 142/84 (BP Location: Left Arm, Patient Position: Sitting, Cuff Size: Normal)   Pulse 88   Resp 20   Ht 6\' 1"  (1.854 m)   Wt (!) 375 lb (170.1 kg) Comment: per patient  BMI 49.48 kg/m    Physical Exam  Constitutional: He is oriented to person, place, and time.  He appears well-developed and well-nourished.  HENT:  Head: Normocephalic and atraumatic.  Eyes: Conjunctivae and EOM are normal. Pupils are equal, round, and reactive to light.  Neck: Normal range of motion. Neck supple.  Cardiovascular: Normal rate, regular rhythm and normal heart sounds.  Pulmonary/Chest: Effort normal and breath sounds normal.  Abdominal: Soft. Bowel sounds are normal.  Neurological: He is alert and oriented to person, place, and time.  Skin: Skin is warm and dry. Capillary refill takes less than 2 seconds.  Psychiatric: He has a normal mood and affect. His behavior is normal.  Nursing note and vitals reviewed.    Musculoskeletal Exam: C-spine good range of motion. He has some thoracic kyphosis. Lumbar spine limited range of motion. Shoulder joints elbow joints wrist joints are good range of motion. He has some PIP/DIP thickening no synovitis was noted over MCPs or PIPs. Hip joints, knee joints are good range of motion. He crepitus with range of motion of bilateral knee joints. No synovitis was noted.  CDAI Exam: No CDAI exam completed.    Investigation: No additional findings. No flowsheet data found.UDS: 08/20/2016 Narc agreement: 08/20/2016 CMP Latest Ref Rng & Units 08/20/2016  Glucose 65 - 99 mg/dL 173(H)  BUN 7 - 25 mg/dL 19  Creatinine 0.70 - 1.25 mg/dL 0.95  Sodium 135 - 146 mmol/L 141  Potassium 3.5 - 5.3 mmol/L 4.5  Chloride 98 - 110 mmol/L 103  CO2 20 - 31 mmol/L 27  Calcium 8.6 - 10.3 mg/dL 9.5  Total Protein 6.1 - 8.1 g/dL 7.0  Total Bilirubin 0.2 - 1.2 mg/dL 0.4  Alkaline Phos 40 - 115 U/L 86  AST 10 - 35 U/L 15  ALT 9 - 46 U/L 14    Imaging: No results found.  Speciality Comments: No specialty comments available.    Procedures:  No procedures performed Allergies: Celebrex [celecoxib]; Methotrexate derivatives; and Sulfa antibiotics   Assessment / Plan:     Visit Diagnoses: Rheumatoid arthritis involving multiple sites, unspecified  rheumatoid factor presence [facility]): Patient has no synovitis on examination. His arthritis seems to be in remission.  Primary osteoarthritis of both hands: Chronic pain due to underlying osteoarthritis. Joint protection and muscle strengthening discussed.  Primary osteoarthritis of both knees: He continues to have knee joint discomfort. He states he has had good response to Visco supplement injections in the past. We have applied for repeat Visco supplement injections. Once we get approved on the injections we will contact him. A handout on knee joint exercises was given.  DDD (degenerative disc disease), lumbar: He has chronic pain and discomfort.  Morbid obesity: Weight loss diet and exercise was discussed at length.  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  No orders of the defined types were  placed in this encounter.   Follow-Up Instructions: Return in about 6 months (around 09/09/2017) for Rheumatoid arthritis, Osteoarthritis.   Bo Merino, MD  Note - This record has been created using Editor, commissioning.  Chart creation errors have been sought, but may not always  have been located. Such creation errors do not reflect on  the standard of medical care.

## 2017-03-08 LAB — PAIN MGMT, PROFILE 5 W/CONF, U
Amphetamines: NEGATIVE ng/mL (ref <500)
Barbiturates: NEGATIVE ng/mL (ref <300)
Benzodiazepines: NEGATIVE ng/mL (ref <100)
Cocaine Metabolite: NEGATIVE ng/mL (ref <150)
Codeine: NEGATIVE ng/mL (ref <50)
Creatinine: 179.6 mg/dL
Hydrocodone: 1778 ng/mL — ABNORMAL HIGH (ref <50)
Hydromorphone: 528 ng/mL — ABNORMAL HIGH (ref <50)
Marijuana Metabolite: NEGATIVE ng/mL (ref <20)
Methadone Metabolite: NEGATIVE ng/mL (ref <100)
Morphine: NEGATIVE ng/mL (ref <50)
Norhydrocodone: 1716 ng/mL — ABNORMAL HIGH (ref <50)
Opiates: POSITIVE ng/mL — AB (ref <100)
Oxidant: NEGATIVE ug/mL (ref <200)
Oxycodone: NEGATIVE ng/mL (ref <100)
pH: 5.91 (ref 4.5–9.0)

## 2017-03-12 ENCOUNTER — Ambulatory Visit: Payer: Medicare Other | Admitting: Rheumatology

## 2017-03-12 ENCOUNTER — Encounter: Payer: Self-pay | Admitting: Rheumatology

## 2017-03-12 VITALS — BP 142/84 | HR 88 | Resp 20 | Ht 73.0 in | Wt 375.0 lb

## 2017-03-12 DIAGNOSIS — M170 Bilateral primary osteoarthritis of knee: Secondary | ICD-10-CM | POA: Diagnosis not present

## 2017-03-12 DIAGNOSIS — Z8639 Personal history of other endocrine, nutritional and metabolic disease: Secondary | ICD-10-CM | POA: Diagnosis not present

## 2017-03-12 DIAGNOSIS — M069 Rheumatoid arthritis, unspecified: Secondary | ICD-10-CM

## 2017-03-12 DIAGNOSIS — M5136 Other intervertebral disc degeneration, lumbar region: Secondary | ICD-10-CM | POA: Diagnosis not present

## 2017-03-12 DIAGNOSIS — I499 Cardiac arrhythmia, unspecified: Secondary | ICD-10-CM

## 2017-03-12 DIAGNOSIS — C632 Malignant neoplasm of scrotum: Secondary | ICD-10-CM

## 2017-03-12 DIAGNOSIS — Z8679 Personal history of other diseases of the circulatory system: Secondary | ICD-10-CM | POA: Diagnosis not present

## 2017-03-12 DIAGNOSIS — Z8659 Personal history of other mental and behavioral disorders: Secondary | ICD-10-CM | POA: Diagnosis not present

## 2017-03-12 DIAGNOSIS — Z87898 Personal history of other specified conditions: Secondary | ICD-10-CM | POA: Diagnosis not present

## 2017-03-12 DIAGNOSIS — Z6841 Body Mass Index (BMI) 40.0 and over, adult: Secondary | ICD-10-CM

## 2017-03-12 DIAGNOSIS — M19041 Primary osteoarthritis, right hand: Secondary | ICD-10-CM

## 2017-03-12 DIAGNOSIS — Z8669 Personal history of other diseases of the nervous system and sense organs: Secondary | ICD-10-CM | POA: Diagnosis not present

## 2017-03-12 DIAGNOSIS — M19042 Primary osteoarthritis, left hand: Secondary | ICD-10-CM

## 2017-03-12 NOTE — Patient Instructions (Signed)

## 2017-04-01 ENCOUNTER — Other Ambulatory Visit: Payer: Self-pay

## 2017-04-01 MED ORDER — HYDROCODONE-ACETAMINOPHEN 5-325 MG PO TABS: 1.0000 | ORAL_TABLET | Freq: Two times a day (BID) | ORAL | 0 refills | Status: DC | PRN

## 2017-04-01 NOTE — Telephone Encounter (Signed)
Patient would like a Rx refill on Hydrocodone. Cb# is [phone].  Please advise.  Thank You.

## 2017-04-01 NOTE — Addendum Note (Signed)
Addended by: Carole Binning on: 04/01/2017 10:56 AM   Modules accepted: Orders

## 2017-04-01 NOTE — Telephone Encounter (Signed)
ok 

## 2017-04-01 NOTE — Telephone Encounter (Signed)
Last Visit: 03/12/17  Next Visit: 09/10/17 UDS: 03/03/17 Narc Agreement: 08/20/16  Okay to refill Hydrocodone?

## 2017-04-30 ENCOUNTER — Other Ambulatory Visit: Payer: Self-pay | Admitting: Rheumatology

## 2017-04-30 NOTE — Telephone Encounter (Signed)
Patient called to request a prescription refill of Hydrocodone.  CB# [phone]

## 2017-05-01 MED ORDER — HYDROCODONE-ACETAMINOPHEN 5-325 MG PO TABS: 1.0000 | ORAL_TABLET | Freq: Two times a day (BID) | ORAL | 0 refills | Status: DC | PRN

## 2017-05-01 NOTE — Telephone Encounter (Signed)
Last Visit: 03/12/17      Next Visit: 09/10/17 UDS: 03/03/17 Narc Agreement: 08/20/16  Okay to refill Hydrocodone?

## 2017-05-01 NOTE — Telephone Encounter (Signed)
ok 

## 2017-05-29 ENCOUNTER — Telehealth: Payer: Self-pay | Admitting: Rheumatology

## 2017-05-29 NOTE — Telephone Encounter (Signed)
Brain with [facility] calling to get validation on RX for Euflexxa . [phone] fax, [phone].

## 2017-05-29 NOTE — Telephone Encounter (Signed)
Pending shipment, patient purchase, bil

## 2017-06-03 ENCOUNTER — Telehealth: Payer: Self-pay | Admitting: Rheumatology

## 2017-06-03 MED ORDER — HYDROCODONE-ACETAMINOPHEN 5-325 MG PO TABS: 1.0000 | ORAL_TABLET | Freq: Two times a day (BID) | ORAL | 0 refills | Status: DC | PRN

## 2017-06-03 NOTE — Telephone Encounter (Signed)
Last Visit: 03/12/17 Next Visit:09/10/17 UDS:03/03/17 Narc Agreement: 08/20/16  Okay to refill Hydrocodone?

## 2017-06-03 NOTE — Telephone Encounter (Signed)
Patient called requesting a prescription refill of Hydrocodone.  [facility] [place], [place].

## 2017-06-08 ENCOUNTER — Telehealth: Payer: Self-pay | Admitting: Rheumatology

## 2017-06-08 NOTE — Telephone Encounter (Signed)
Please confirm with the pharmacy and then send a new rx.

## 2017-06-08 NOTE — Telephone Encounter (Signed)
Patient called stating that his [facility] [address] [facility] [place] who told them they don't have the medication in stock.  [facility] and they have it.  [facility] told the patient that because it is a controlled substance the prescription cannot be transferred to the other store and to have his doctor send a new prescription [facility] on [address]

## 2017-06-08 NOTE — Telephone Encounter (Signed)
[facility] states [facility] did not [facility] for [facility]one on 2/20. Please send again to [facility] in [place] on [address].

## 2017-06-08 NOTE — Telephone Encounter (Signed)
Patient called back to let us know pharmacy did receive rx for Hydrocodone. They just do not have medication, and will be transferring to a different [facility] for patient. Please disregard request.

## 2017-06-09 MED ORDER — HYDROCODONE-ACETAMINOPHEN 5-325 MG PO TABS: 1.0000 | ORAL_TABLET | Freq: Two times a day (BID) | ORAL | 0 refills | Status: DC | PRN

## 2017-06-09 NOTE — Telephone Encounter (Signed)
Contacted the pharmacy and verified prescription has been received. Contacted patient and advised prescription has been received by the pharmacy.

## 2017-06-09 NOTE — Telephone Encounter (Signed)
Spoke with Dorian Pod at the [facility] on [address] in [place], [place] and she states they do not have the Hydrocodone 5/325 mg in stock and has not filled the prescription for the patient. Cancelled the prescription at this pharmacy. [facility]ed [facility] [address] in [place], [place] and they have it in [place]. Will have prescription sent to the pharmacy.

## 2017-06-09 NOTE — Telephone Encounter (Signed)
Patient calling again to let you know [facility] [address] [facility] not [facility] RX, and needs it sent to fill RX. Please call patient if any questions.

## 2017-07-08 ENCOUNTER — Telehealth: Payer: Self-pay | Admitting: Rheumatology

## 2017-07-08 MED ORDER — HYDROCODONE-ACETAMINOPHEN 5-325 MG PO TABS: 1.0000 | ORAL_TABLET | Freq: Two times a day (BID) | ORAL | 0 refills | Status: DC | PRN

## 2017-07-08 NOTE — Telephone Encounter (Signed)
Patient called requesting prescription refill of Hydrocodone.  [facility] [address] in [place], [place].

## 2017-07-08 NOTE — Telephone Encounter (Signed)
Last Visit: 03/12/17 Next Visit:09/10/17 UDS:03/03/17 Narc Agreement: 08/20/16  Okay to refill Hydrocodone?

## 2017-08-07 ENCOUNTER — Telehealth: Payer: Self-pay | Admitting: Rheumatology

## 2017-08-07 MED ORDER — HYDROCODONE-ACETAMINOPHEN 5-325 MG PO TABS: 1.0000 | ORAL_TABLET | Freq: Two times a day (BID) | ORAL | 0 refills | Status: DC | PRN

## 2017-08-07 NOTE — Telephone Encounter (Signed)
Last Visit: 03/12/17 Next Visit:09/10/17 UDS:03/03/17 Narc Agreement: 08/20/16  Okay to refill Hydrocodone?

## 2017-08-07 NOTE — Telephone Encounter (Signed)
Patient called requesting prescription refill of Hydrocodone.  [facility] [address] in [place].

## 2017-08-27 NOTE — Progress Notes (Signed)
Office Visit Note  Patient: Jesus Rodriguez             Date of Birth: 05-26-1950           MRN: [id number]             PCP: Macon Large, MD Referring: Macon Large, MD Visit Date: 09/10/2017 Occupation: @GUAROCC @    Subjective:  Hand pain and stiffness.Marland Kitchen   History of Present Illness: Jesus Rodriguez is a 67 y.o. male with history of seronegative rheumatoid arthritis, osteoarthritis and DDD.  He has been off methotrexate for almost 1 year per recommendations of his cardiologist.  They felt that his atrial flutter was related to methotrexate.  He states he has been experiencing some stiffness in his hands but no joint swelling.  He continues to have discomfort in his bilateral hands and bilateral knee joints due to underlying osteoarthritis.  He also has lower back pain due to underlying disc disease.  He has been taking hydrocodone 1 tablet twice a day to control his pain symptoms.  Patient reports his pain level on the scale of 0-10 is about 7 without hydrocodone and with hydrocodone about 4.  Activities of Daily Living:  Patient reports morning stiffness for a couple of hours.   Patient Reports nocturnal pain.  Difficulty dressing/grooming: Denies Difficulty climbing stairs: Reports Difficulty getting out of chair: Reports Difficulty using hands for taps, buttons, cutlery, and/or writing: Denies   Review of Systems  Constitutional: Positive for fatigue. Negative for night sweats.  HENT: Negative for mouth sores, mouth dryness and nose dryness.   Eyes: Positive for dryness. Negative for redness.  Respiratory: Positive for shortness of breath. Negative for difficulty breathing.   Cardiovascular: Negative for chest pain, palpitations, hypertension, irregular heartbeat and swelling in legs/feet.  Gastrointestinal: Negative for abdominal pain, constipation and diarrhea.  Endocrine: Negative for increased urination.  Genitourinary: Negative for pelvic pain.  Musculoskeletal:  Positive for arthralgias, joint pain and morning stiffness. Negative for joint swelling, myalgias, muscle weakness, muscle tenderness and myalgias.  Skin: Negative for color change, rash, hair loss, nodules/bumps, skin tightness, ulcers and sensitivity to sunlight.  Allergic/Immunologic: Negative for susceptible to infections.  Neurological: Negative for dizziness, fainting, light-headedness, headaches, memory loss, night sweats and weakness.  Hematological: Positive for bruising/bleeding tendency. Negative for swollen glands.  Psychiatric/Behavioral: Negative for depressed mood, confusion and sleep disturbance. The patient is not nervous/anxious.     PMFS History:  Patient Active Problem List   Diagnosis Date Noted  . History of rheumatoid arthritis seronegative 09/10/2017  . High risk medications (not anticoagulants) long-term use 02/14/2016  . Class 3 obesity in adult 02/14/2016  . Depression 02/14/2016  . Osteoarthritis of both hands 02/13/2016  . Osteoarthritis of both knees 02/13/2016  . DDD (degenerative disc disease), lumbar 02/13/2016  . 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 Hypertension 02/13/2016  . Elevated cholesterol 02/13/2016  . Insomnia 02/13/2016  . Anxiety 02/13/2016  . Sleep apnea 02/13/2016  . Arrhythmia 02/13/2016    Past Medical History:  Diagnosis Date  . Anxiety 02/13/2016  . Arrhythmia 02/13/2016  . DDD (degenerative disc disease), lumbar 02/13/2016  . Elevated cholesterol 02/13/2016  . Hypertension 02/13/2016  . Insomnia 02/13/2016  . Osteoarthritis of both hands 02/13/2016  . Osteoarthritis of both knees 02/13/2016  . RA (rheumatoid arthritis) ([place]) 02/13/2016   Sero Negative  . 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 Sleep apnea 02/13/2016    Family History  Problem Relation Age of Onset  .  Hypertension Father   . Heart attack Father   . Cancer Father        prostate  . Hypertension Sister   . Hypertension Sister   . Hypertension Sister   . Epilepsy Son     . Diabetes Son    Past Surgical History:  Procedure Laterality Date  . A-FLUTTER ABLATION     at [facility] 2018   Social History   Social History Narrative  . Not on file     Objective: Vital Signs: BP (!) 150/82 (BP Location: Left Wrist, Patient Position: Sitting, Cuff Size: Normal)   Pulse 70   Resp 19   Ht 6\' 1"  (1.854 m)   Wt (!) 374 lb (169.6 kg)   BMI 49.34 kg/m    Physical Exam  Constitutional: He is oriented to person, place, and time. He appears well-developed and well-nourished.  Overweight   HENT:  Head: Normocephalic and atraumatic.  Eyes: Pupils are equal, round, and reactive to light. Conjunctivae and EOM are normal.  Neck: Normal range of motion. Neck supple.  Cardiovascular: Normal rate, regular rhythm and normal heart sounds.  Pulmonary/Chest: Effort normal and breath sounds normal.  Abdominal: Soft. Bowel sounds are normal.  Neurological: He is alert and oriented to person, place, and time.  Skin: Skin is warm and dry. Capillary refill takes less than 2 seconds.  Psychiatric: He has a normal mood and affect. His behavior is normal.  Nursing note and vitals reviewed.    Musculoskeletal Exam: C-spine thoracic lumbar spine limited range of motion probably secondary to body habitus.  Shoulder joints, elbow joints, wrist joints, MCPs PIPs been good range of motion.  He has PIP and DIP thickening consistent with osteoarthritis.  No synovitis was noted.  Hip joints knee joints are good range of motion with no synovitis.  CDAI Exam: No CDAI exam completed.    Investigation: No additional findings.   Imaging: No results found.  Speciality Comments: No specialty comments available.    Procedures:  No procedures performed Allergies: Celebrex [celecoxib]; Methotrexate derivatives; and Sulfa antibiotics   Assessment / Plan:     Visit Diagnoses: Primary osteoarthritis of both hands-joint protection muscle strengthening was discussed.  He continues to have  a stiffness and discomfort in his bilateral hands.  Primary osteoarthritis of both knees-he has ongoing discomfort in his knee joints.  He states he has difficulty walking and hydrocodone has been helpful for him to stay active.  Weight loss diet and exercise was discussed.  DDD (degenerative disc disease), lumbar-he has chronic pain and discomfort.  History of rheumatoid arthritis seronegative - In remission-he has been off methotrexate for more than a year now without any synovitis on examination.  Medication monitoring encounter - tramadol UDS:  - Plan: Pain Mgmt, Profile 5 w/Conf, the.  Narcotic agreement was renewed today.  A prescription refill for hydrocodone was given today.  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  History of sleep apnea  History of hypertension  History of hypercholesterolemia  History of anxiety  History of insomnia  History of depression  History of obesity-loss diet and exercise was discussed at length.  Cardiac arrhythmia, unspecified cardiac arrhythmia type    Orders: Orders Placed This Encounter  Procedures  . Pain Mgmt, Profile 5 w/Conf, U   No orders of the defined types were placed in this encounter.   Face-to-face time spent with patient was 30 minutes.> 50% of time was spent in counseling  and coordination of care.  Follow-Up Instructions: Return in about 6 months (around 03/13/2018) for Osteoarthritis.   Bo Merino, MD  Note - This record has been created using Editor, commissioning.  Chart creation errors have been sought, but may not always  have been located. Such creation errors do not reflect on  the standard of medical care.

## 2017-09-10 ENCOUNTER — Encounter: Payer: Self-pay | Admitting: Rheumatology

## 2017-09-10 ENCOUNTER — Ambulatory Visit: Payer: Medicare Other | Admitting: Rheumatology

## 2017-09-10 VITALS — BP 150/82 | HR 70 | Resp 19 | Ht 73.0 in | Wt 374.0 lb

## 2017-09-10 DIAGNOSIS — I499 Cardiac arrhythmia, unspecified: Secondary | ICD-10-CM | POA: Diagnosis not present

## 2017-09-10 DIAGNOSIS — Z8739 Personal history of other diseases of the musculoskeletal system and connective tissue: Secondary | ICD-10-CM | POA: Diagnosis not present

## 2017-09-10 DIAGNOSIS — Z8659 Personal history of other mental and behavioral disorders: Secondary | ICD-10-CM | POA: Diagnosis not present

## 2017-09-10 DIAGNOSIS — Z8679 Personal history of other diseases of the circulatory system: Secondary | ICD-10-CM

## 2017-09-10 DIAGNOSIS — Z8639 Personal history of other endocrine, nutritional and metabolic disease: Secondary | ICD-10-CM

## 2017-09-10 DIAGNOSIS — M19042 Primary osteoarthritis, left hand: Secondary | ICD-10-CM

## 2017-09-10 DIAGNOSIS — Z8669 Personal history of other diseases of the nervous system and sense organs: Secondary | ICD-10-CM

## 2017-09-10 DIAGNOSIS — Z87898 Personal history of other specified conditions: Secondary | ICD-10-CM | POA: Diagnosis not present

## 2017-09-10 DIAGNOSIS — M19041 Primary osteoarthritis, right hand: Secondary | ICD-10-CM

## 2017-09-10 DIAGNOSIS — Z5181 Encounter for therapeutic drug level monitoring: Secondary | ICD-10-CM | POA: Diagnosis not present

## 2017-09-10 DIAGNOSIS — M170 Bilateral primary osteoarthritis of knee: Secondary | ICD-10-CM

## 2017-09-10 DIAGNOSIS — M5136 Other intervertebral disc degeneration, lumbar region: Secondary | ICD-10-CM

## 2017-09-10 DIAGNOSIS — C632 Malignant neoplasm of scrotum: Secondary | ICD-10-CM | POA: Diagnosis not present

## 2017-09-10 MED ORDER — HYDROCODONE-ACETAMINOPHEN 5-325 MG PO TABS: 1.0000 | ORAL_TABLET | Freq: Two times a day (BID) | ORAL | 0 refills | Status: DC | PRN

## 2017-09-13 LAB — PAIN MGMT, PROFILE 5 W/CONF, U
Amphetamines: NEGATIVE ng/mL (ref <500)
Barbiturates: NEGATIVE ng/mL (ref <300)
Benzodiazepines: NEGATIVE ng/mL (ref <100)
Cocaine Metabolite: NEGATIVE ng/mL (ref <150)
Codeine: NEGATIVE ng/mL (ref <50)
Creatinine: 220.2 mg/dL
Hydrocodone: 1800 ng/mL — ABNORMAL HIGH (ref <50)
Hydromorphone: 333 ng/mL — ABNORMAL HIGH (ref <50)
Marijuana Metabolite: NEGATIVE ng/mL (ref <20)
Methadone Metabolite: NEGATIVE ng/mL (ref <100)
Morphine: NEGATIVE ng/mL (ref <50)
Norhydrocodone: 1186 ng/mL — ABNORMAL HIGH (ref <50)
Opiates: POSITIVE ng/mL — AB (ref <100)
Oxidant: NEGATIVE ug/mL (ref <200)
Oxycodone: NEGATIVE ng/mL (ref <100)
pH: 5.37 (ref 4.5–9.0)

## 2017-09-14 NOTE — Progress Notes (Signed)
C/w

## 2017-10-12 ENCOUNTER — Telehealth: Payer: Self-pay | Admitting: Rheumatology

## 2017-10-12 MED ORDER — HYDROCODONE-ACETAMINOPHEN 5-325 MG PO TABS: 1.0000 | ORAL_TABLET | Freq: Two times a day (BID) | ORAL | 0 refills | Status: DC | PRN

## 2017-10-12 NOTE — Telephone Encounter (Signed)
Last Visit: 09/10/17 Next Visit: 03/16/18 UDS: 09/10/17 Narc Agreement: 09/10/17  Okay to refill Hydrocodone?

## 2017-10-12 NOTE — Telephone Encounter (Signed)
Patient called requesting prescription refill of [facility] [address] in [place].   °

## 2017-10-13 ENCOUNTER — Telehealth: Payer: Self-pay | Admitting: Rheumatology

## 2017-10-13 NOTE — Telephone Encounter (Signed)
Patient left a voicemail stating that [facility] an hour ago and they have not received the prescription refill of Hydrocodone.

## 2017-10-13 NOTE — Telephone Encounter (Signed)
Patient states that about an hour after he received the call form me he received another call telling him that his [facility]. patient has been advised that we are unable to fax this prescription to the pharmacy. Patient advised he will have to come to the pharmacy to pick up this prescription.

## 2017-11-12 ENCOUNTER — Telehealth: Payer: Self-pay | Admitting: Rheumatology

## 2017-11-12 MED ORDER — HYDROCODONE-ACETAMINOPHEN 5-325 MG PO TABS: 1.0000 | ORAL_TABLET | Freq: Two times a day (BID) | ORAL | 0 refills | Status: DC | PRN

## 2017-11-12 NOTE — Telephone Encounter (Signed)
Last visit: 09/10/2017 Next visit: 03/16/2018 UDS: 09/10/2017 c/w Narc agreement: 09/10/2017  Last fill: 10/12/2017  Okay to refill hydrocodone?

## 2017-11-12 NOTE — Telephone Encounter (Signed)
Patient called requesting prescription refill of [facility] [address] in [place], [place].

## 2017-12-15 ENCOUNTER — Telehealth: Payer: Self-pay | Admitting: Rheumatology

## 2017-12-15 MED ORDER — HYDROCODONE-ACETAMINOPHEN 5-325 MG PO TABS: 1.0000 | ORAL_TABLET | Freq: Two times a day (BID) | ORAL | 0 refills | Status: DC | PRN

## 2017-12-15 NOTE — Telephone Encounter (Signed)
Last visit: 09/10/2017 Next visit: 03/16/2018 UDS: 09/10/2017 c/w Narc agreement: 09/10/2017  Last fill: 11/12/2017  Okay to refill hydrocodone?

## 2017-12-15 NOTE — Telephone Encounter (Signed)
Patient left a voicemail requesting prescription refill of [facility]cod[facility] [address] in [place].

## 2018-01-14 ENCOUNTER — Telehealth: Payer: Self-pay | Admitting: Rheumatology

## 2018-01-14 MED ORDER — HYDROCODONE-ACETAMINOPHEN 5-325 MG PO TABS: 1.0000 | ORAL_TABLET | Freq: Two times a day (BID) | ORAL | 0 refills | Status: DC | PRN

## 2018-01-14 NOTE — Telephone Encounter (Signed)
Patient called requesting prescription refill of [facility] [address] in [place].   °

## 2018-01-14 NOTE — Telephone Encounter (Signed)
Last Visit: 09/10/17 Next visit: 03/16/18 UDS: 09/10/17 Narc Agreement:  09/18/17  Okay to refill Hydrocodone?

## 2018-02-10 HISTORY — PX: CARDIOVERSION: SHX1299

## 2018-02-15 ENCOUNTER — Other Ambulatory Visit: Payer: Self-pay | Admitting: Rheumatology

## 2018-02-15 DIAGNOSIS — Z5181 Encounter for therapeutic drug level monitoring: Secondary | ICD-10-CM

## 2018-02-15 NOTE — Telephone Encounter (Signed)
Spoke with patient and he will update UDS and narc  agreement on 02/16/18.

## 2018-02-15 NOTE — Telephone Encounter (Signed)
Patient called requesting prescription refill of [facility] [address] in [place].   °

## 2018-02-15 NOTE — Telephone Encounter (Signed)
Patient is due to update UDS and narcotic agreement.

## 2018-02-15 NOTE — Telephone Encounter (Signed)
Last Visit: 09/10/17 Next visit: 03/16/18 UDS: 09/10/17 Narc Agreement:  09/18/17  Okay to refill Hydrocodone?

## 2018-02-17 ENCOUNTER — Other Ambulatory Visit: Payer: Self-pay

## 2018-02-17 DIAGNOSIS — Z5181 Encounter for therapeutic drug level monitoring: Secondary | ICD-10-CM

## 2018-02-17 MED ORDER — HYDROCODONE-ACETAMINOPHEN 5-325 MG PO TABS: 1.0000 | ORAL_TABLET | Freq: Two times a day (BID) | ORAL | 0 refills | Status: DC | PRN

## 2018-02-22 LAB — PAIN MGMT, PROFILE 5 W/CONF, U
Amphetamines: NEGATIVE ng/mL (ref <500)
Barbiturates: NEGATIVE ng/mL (ref <300)
Benzodiazepines: NEGATIVE ng/mL (ref <100)
Cocaine Metabolite: NEGATIVE ng/mL (ref <150)
Codeine: NEGATIVE ng/mL (ref <50)
Creatinine: 102.6 mg/dL
Hydrocodone: 386 ng/mL — ABNORMAL HIGH (ref <50)
Hydromorphone: 218 ng/mL — ABNORMAL HIGH (ref <50)
Marijuana Metabolite: NEGATIVE ng/mL (ref <20)
Methadone Metabolite: NEGATIVE ng/mL (ref <100)
Morphine: NEGATIVE ng/mL (ref <50)
Norhydrocodone: 475 ng/mL — ABNORMAL HIGH (ref <50)
Opiates: POSITIVE ng/mL — AB (ref <100)
Oxidant: NEGATIVE ug/mL (ref <200)
Oxycodone: NEGATIVE ng/mL (ref <100)
pH: 7 (ref 4.5–9.0)

## 2018-02-23 NOTE — Progress Notes (Signed)
C/w

## 2018-03-02 NOTE — Progress Notes (Signed)
Office Visit Note  Patient: Jesus Rodriguez             Date of Birth: February 06, 1951           MRN: [id number]             PCP: Macon Large, MD Referring: Macon Large, MD Visit Date: 03/16/2018 Occupation: @GUAROCC @  Subjective:  Chronic bilateral knee pain   History of Present Illness: Daronte Shostak is a 67 y.o. male with history of osteoarthritis and DDD.  Patient takes hydrocodone 1 tablet by mouth BID PRN for pain relief.  He continues to have chronic pain in bilateral knee joints.  He denies any joint swelling.  He states that the pain is most severe when he was descending stairs.  He states that in the mornings he has stiffness in bilateral hands lasting 1 to 2 hours.  He continues to have intermittent lower back pain and stiffness.  He states that every 3 months or so he develops pain in 1 of his heels which migrates to the dorsal aspect of his feet.  He states that in the past the pain is occurred in his right foot as well as his left foot but not simultaneously.  He states that the pain lasts for about 3 days and resolves on its own.  He states he typically is preceded by eating sausage.  He is questioning whether he has gout.    Activities of Daily Living:  Patient reports morning stiffness for 1-2 hours.   Patient Denies nocturnal pain.  Difficulty dressing/grooming: Denies Difficulty climbing stairs: Reports Difficulty getting out of chair: Denies Difficulty using hands for taps, buttons, cutlery, and/or writing: Denies  Review of Systems  Constitutional: Positive for fatigue. Negative for night sweats.  HENT: Negative for mouth sores, trouble swallowing, trouble swallowing, mouth dryness and nose dryness.   Eyes: Negative for redness, visual disturbance and dryness.  Respiratory: Negative for cough, hemoptysis, shortness of breath and difficulty breathing.   Cardiovascular: Positive for irregular heartbeat (Afib). Negative for chest pain, palpitations, hypertension and  swelling in legs/feet.  Gastrointestinal: Negative for blood in stool, constipation and diarrhea.  Endocrine: Negative for increased urination.  Genitourinary: Negative for painful urination.  Musculoskeletal: Positive for arthralgias, joint pain and morning stiffness. Negative for joint swelling, myalgias, muscle weakness, muscle tenderness and myalgias.  Skin: Negative for color change, rash, hair loss, nodules/bumps, skin tightness, ulcers and sensitivity to sunlight.  Allergic/Immunologic: Negative for susceptible to infections.  Neurological: Negative for dizziness, fainting, memory loss, night sweats and weakness.  Hematological: Negative for swollen glands.  Psychiatric/Behavioral: Negative for depressed mood and sleep disturbance. The patient is not nervous/anxious.     PMFS History:  Patient Active Problem List   Diagnosis Date Noted  . History of rheumatoid arthritis seronegative 09/10/2017  . High risk medications (not anticoagulants) long-term use 02/14/2016  . Class 3 obesity in adult 02/14/2016  . Depression 02/14/2016  . Osteoarthritis of both hands 02/13/2016  . Osteoarthritis of both knees 02/13/2016  . DDD (degenerative disc disease), lumbar 02/13/2016  . 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 Hypertension 02/13/2016  . Elevated cholesterol 02/13/2016  . Insomnia 02/13/2016  . Anxiety 02/13/2016  . Sleep apnea 02/13/2016  . Arrhythmia 02/13/2016    Past Medical History:  Diagnosis Date  . Anxiety 02/13/2016  . Arrhythmia 02/13/2016  . DDD (degenerative disc disease), lumbar 02/13/2016  . Elevated cholesterol 02/13/2016  . Hypertension 02/13/2016  . Insomnia 02/13/2016  . Osteoarthritis  of both hands 02/13/2016  . Osteoarthritis of both knees 02/13/2016  . RA (rheumatoid arthritis) ([place]) 02/13/2016   Sero Negative  . 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 Sleep apnea 02/13/2016    Family History  Problem Relation Age of Onset  . Hypertension Father   . Heart attack  Father   . Cancer Father        prostate  . Hypertension Sister   . Hypertension Sister   . Hypertension Sister   . Epilepsy Son   . Diabetes Son    Past Surgical History:  Procedure Laterality Date  . A-FLUTTER ABLATION     at [facility] 2018  . CARDIOVERSION  02/10/2018   Social History   Social History Narrative  . Not on file    Objective: Vital Signs: BP (!) 146/89 (BP Location: Right Wrist, Patient Position: Sitting, Cuff Size: Normal)   Pulse (!) 56   Resp 17   Ht 6' 1.5" (1.867 m)   Wt (!) 389 lb (176.4 kg)   BMI 50.63 kg/m    Physical Exam  Constitutional: He is oriented to person, place, and time. He appears well-developed and well-nourished.  HENT:  Head: Normocephalic and atraumatic.  Eyes: Pupils are equal, round, and reactive to light. Conjunctivae and EOM are normal.  Neck: Normal range of motion. Neck supple.  Cardiovascular: Normal heart sounds.  Irregularly irregular   Pulmonary/Chest: Effort normal and breath sounds normal.  Abdominal: Soft. Bowel sounds are normal.  Lymphadenopathy:    He has no cervical adenopathy.  Neurological: He is alert and oriented to person, place, and time.  Skin: Skin is warm and dry. Capillary refill takes less than 2 seconds.  Psychiatric: He has a normal mood and affect. His behavior is normal.  Nursing note and vitals reviewed.    Musculoskeletal Exam: C-spine slightly limited range of motion with lateral rotation.  Limited range of motion of thoracic and lumbar spine.  No midline spinal tenderness.  No SI joint tenderness.  Shoulder joints, elbow joints and wrist joints, MCPs and PIPs and DIPs good range of motion no synovitis.  He has PIP and DIP synovial thickening consistent with osteoarthritis of bilateral hands.  Hip joints, ankle joints good range of motion with no discomfort.  He has slightly limited extension of bilateral knee joints.  No warmth or effusion of bilateral knee joints.  No tenderness or swelling of  ankle joints.  CDAI Exam: CDAI Score: Not documented Patient Global Assessment: Not documented; Provider Global Assessment: Not documented Swollen: Not documented; Tender: Not documented Joint Exam   Not documented   There is currently no information documented on the homunculus. Go to the Rheumatology activity and complete the homunculus joint exam.  Investigation: No additional findings.  Imaging: No results found.  Recent Labs: Lab Results  Component Value Date   NA 141 08/20/2016   K 4.5 08/20/2016   CL 103 08/20/2016   CO2 27 08/20/2016   GLUCOSE 173 (H) 08/20/2016   BUN 19 08/20/2016   CREATININE 0.95 08/20/2016   BILITOT 0.4 08/20/2016   ALKPHOS 86 08/20/2016   AST 15 08/20/2016   ALT 14 08/20/2016   PROT 7.0 08/20/2016   ALBUMIN 4.1 08/20/2016   CALCIUM 9.5 08/20/2016   GFRAA >89 08/20/2016    Speciality Comments: No specialty comments available.  Procedures:  No procedures performed Allergies: Celebrex [celecoxib]; Methotrexate derivatives; and Sulfa antibiotics   Assessment / Plan:     Visit Diagnoses: Primary osteoarthritis of both  hands: He has PIP and DIP synovial thickening.  No synovitis on exam.  Complete fist formation bilaterally. Joint protection and muscle strengthening discussed.   Primary osteoarthritis of both knees: Chronic pain.  He has a limited extension with some discomfort.  No warmth or effusion noted.  He has difficulty going down steps.  History of rheumatoid arthritis seronegative - In remission-He has been off methotrexate for more than a year now without any synovitis on examination.  Heel pain, unspecified laterality: He has a history of intermittent heel pain that migrates to the dorsal aspect of his foot.  He states his episodes occur about every 3 months.  He states that it is occurred in both the right and left foot.  He states that these episodes are typically preceded by eating sausage.  He is curious if he has the diagnosis  of gout.  We will check a uric acid level today.  He is currently asymptomatic.  DDD (degenerative disc disease), lumbar: He has intermittent discomfort in the lower back.  No midline spinal tenderness.  He takes hydrocodone 1 tablet by mouth twice daily PRN for pain relief.  Medication monitoring encounter - Hydrocodone. UDS: 02/17/2018. Narc agreement: 02/17/2018.  He would like a refill of hydrocodone sent in on 03/19/2018.  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  Cardiac arrhythmia, unspecified cardiac arrhythmia type  History of sleep apnea  History of obesity  History of depression  History of anxiety  History of insomnia  History of hypertension  History of hypercholesterolemia    Orders: Orders Placed This Encounter  Procedures  . Uric acid   No orders of the defined types were placed in this encounter.     Follow-Up Instructions: Return in about 6 months (around 09/15/2018) for Osteoarthritis, DDD.   Ofilia Neas, PA-C  Note - This record has been created using Dragon software.  Chart creation errors have been sought, but may not always  have been located. Such creation errors do not reflect on  the standard of medical care.

## 2018-03-16 ENCOUNTER — Encounter: Payer: Self-pay | Admitting: Rheumatology

## 2018-03-16 ENCOUNTER — Ambulatory Visit: Payer: Medicare Other | Admitting: Rheumatology

## 2018-03-16 VITALS — BP 146/89 | HR 56 | Resp 17 | Ht 73.5 in | Wt 389.0 lb

## 2018-03-16 DIAGNOSIS — M5136 Other intervertebral disc degeneration, lumbar region: Secondary | ICD-10-CM

## 2018-03-16 DIAGNOSIS — Z8669 Personal history of other diseases of the nervous system and sense organs: Secondary | ICD-10-CM

## 2018-03-16 DIAGNOSIS — Z8739 Personal history of other diseases of the musculoskeletal system and connective tissue: Secondary | ICD-10-CM | POA: Diagnosis not present

## 2018-03-16 DIAGNOSIS — M19041 Primary osteoarthritis, right hand: Secondary | ICD-10-CM

## 2018-03-16 DIAGNOSIS — Z87898 Personal history of other specified conditions: Secondary | ICD-10-CM

## 2018-03-16 DIAGNOSIS — C632 Malignant neoplasm of scrotum: Secondary | ICD-10-CM

## 2018-03-16 DIAGNOSIS — Z8639 Personal history of other endocrine, nutritional and metabolic disease: Secondary | ICD-10-CM

## 2018-03-16 DIAGNOSIS — M170 Bilateral primary osteoarthritis of knee: Secondary | ICD-10-CM | POA: Diagnosis not present

## 2018-03-16 DIAGNOSIS — Z8659 Personal history of other mental and behavioral disorders: Secondary | ICD-10-CM

## 2018-03-16 DIAGNOSIS — I499 Cardiac arrhythmia, unspecified: Secondary | ICD-10-CM

## 2018-03-16 DIAGNOSIS — M19042 Primary osteoarthritis, left hand: Secondary | ICD-10-CM

## 2018-03-16 DIAGNOSIS — Z8679 Personal history of other diseases of the circulatory system: Secondary | ICD-10-CM

## 2018-03-16 DIAGNOSIS — M79673 Pain in unspecified foot: Secondary | ICD-10-CM

## 2018-03-16 DIAGNOSIS — Z5181 Encounter for therapeutic drug level monitoring: Secondary | ICD-10-CM

## 2018-03-16 LAB — URIC ACID: Uric Acid, Serum: 8.3 mg/dL — ABNORMAL HIGH (ref 4.0–8.0)

## 2018-03-17 NOTE — Progress Notes (Deleted)
Office Visit Note  Patient: [facility]             Date of Birth: 31-Jan-1951           MRN: [id number]             PCP: Macon Large, MD Referring: Macon Large, MD Visit Date: 03/24/2018 Occupation: @GUAROCC @  Subjective:  No chief complaint on file.   History of Present Illness: [facility] is a 67 y.o. male ***   Activities of Daily Living:  Patient reports morning stiffness for *** {minute/hour:19697}.   Patient {ACTIONS;DENIES/REPORTS:21021675::"Denies"} nocturnal pain.  Difficulty dressing/grooming: {ACTIONS;DENIES/REPORTS:21021675::"Denies"} Difficulty climbing stairs: {ACTIONS;DENIES/REPORTS:21021675::"Denies"} Difficulty getting out of chair: {ACTIONS;DENIES/REPORTS:21021675::"Denies"} Difficulty using hands for taps, buttons, cutlery, and/or writing: {ACTIONS;DENIES/REPORTS:21021675::"Denies"}  No Rheumatology ROS completed.   PMFS History:  Patient Active Problem List   Diagnosis Date Noted  . History of rheumatoid arthritis seronegative 09/10/2017  . High risk medications (not anticoagulants) long-term use 02/14/2016  . Class 3 obesity in adult 02/14/2016  . Depression 02/14/2016  . Osteoarthritis of both hands 02/13/2016  . Osteoarthritis of both knees 02/13/2016  . DDD (degenerative disc disease), lumbar 02/13/2016  . 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 Hypertension 02/13/2016  . Elevated cholesterol 02/13/2016  . Insomnia 02/13/2016  . Anxiety 02/13/2016  . Sleep apnea 02/13/2016  . Arrhythmia 02/13/2016    Past Medical History:  Diagnosis Date  . Anxiety 02/13/2016  . Arrhythmia 02/13/2016  . DDD (degenerative disc disease), lumbar 02/13/2016  . Elevated cholesterol 02/13/2016  . Hypertension 02/13/2016  . Insomnia 02/13/2016  . Osteoarthritis of both hands 02/13/2016  . Osteoarthritis of both knees 02/13/2016  . RA (rheumatoid arthritis) ([place]) 02/13/2016   Sero Negative  . 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 Sleep apnea 02/13/2016    Family  History  Problem Relation Age of Onset  . Hypertension Father   . Heart attack Father   . Cancer Father        prostate  . Hypertension Sister   . Hypertension Sister   . Hypertension Sister   . Epilepsy Son   . Diabetes Son    Past Surgical History:  Procedure Laterality Date  . A-FLUTTER ABLATION     at [facility] 2018  . CARDIOVERSION  02/10/2018   Social History   Social History Narrative  . Not on file    Objective: Vital Signs: There were no vitals taken for this visit.   Physical Exam   Musculoskeletal Exam: ***  CDAI Exam: CDAI Score: Not documented Patient Global Assessment: Not documented; Provider Global Assessment: Not documented Swollen: Not documented; Tender: Not documented Joint Exam   Not documented   There is currently no information documented on the homunculus. Go to the Rheumatology activity and complete the homunculus joint exam.  Investigation: No additional findings.  Imaging: No results found.  Recent Labs: Lab Results  Component Value Date   NA 141 08/20/2016   K 4.5 08/20/2016   CL 103 08/20/2016   CO2 27 08/20/2016   GLUCOSE 173 (H) 08/20/2016   BUN 19 08/20/2016   CREATININE 0.95 08/20/2016   BILITOT 0.4 08/20/2016   ALKPHOS 86 08/20/2016   AST 15 08/20/2016   ALT 14 08/20/2016   PROT 7.0 08/20/2016   ALBUMIN 4.1 08/20/2016   CALCIUM 9.5 08/20/2016   GFRAA >89 08/20/2016    Speciality Comments: No specialty comments available.  Procedures:  No procedures performed Allergies: Celebrex [celecoxib]; Methotrexate derivatives; and Sulfa antibiotics   Assessment /  Plan:     Visit Diagnoses: No diagnosis found.   Orders: No orders of the defined types were placed in this encounter.  No orders of the defined types were placed in this encounter.   Face-to-face time spent with patient was *** minutes. Greater than 50% of time was spent in counseling and coordination of care.  Follow-Up Instructions: No follow-ups on  file.   Earnestine Mealing, CMA  Note - This record has been created using Editor, commissioning.  Chart creation errors have been sought, but may not always  have been located. Such creation errors do not reflect on  the standard of medical care.

## 2018-03-17 NOTE — Progress Notes (Signed)
Uric acid is elevated.  Please advise patient to schedule a return visit to discuss starting on a uric acid lowering agent-allopurinol.

## 2018-03-19 ENCOUNTER — Telehealth: Payer: Self-pay | Admitting: Rheumatology

## 2018-03-19 MED ORDER — HYDROCODONE-ACETAMINOPHEN 5-325 MG PO TABS: 1.0000 | ORAL_TABLET | Freq: Two times a day (BID) | ORAL | 0 refills | Status: DC | PRN

## 2018-03-19 NOTE — Telephone Encounter (Signed)
Last Visit: 03/16/18 Next visit: 09/16/18 UDS: 02/17/18 Narc Agreement: 02/17/18  Okay to refill Hydrocodone?

## 2018-03-19 NOTE — Telephone Encounter (Signed)
Patient requests a refill on Hydrocodone sent to [facility] on [address] in [place].

## 2018-03-24 ENCOUNTER — Ambulatory Visit: Payer: Self-pay | Admitting: Physician Assistant

## 2018-03-24 NOTE — Progress Notes (Deleted)
Office Visit Note  Patient: Jesus Rodriguez             Date of Birth: Oct 31, 1950           MRN: [id number]             PCP: Macon Large, MD Referring: Macon Large, MD Visit Date: 03/26/2018 Occupation: @GUAROCC @  Subjective:  No chief complaint on file.   History of Present Illness: [facility] Rodriguez is a 67 y.o. male ***   Activities of Daily Living:  Patient reports morning stiffness for *** {minute/hour:19697}.   Patient {ACTIONS;DENIES/REPORTS:21021675::"Denies"} nocturnal pain.  Difficulty dressing/grooming: {ACTIONS;DENIES/REPORTS:21021675::"Denies"} Difficulty climbing stairs: {ACTIONS;DENIES/REPORTS:21021675::"Denies"} Difficulty getting out of chair: {ACTIONS;DENIES/REPORTS:21021675::"Denies"} Difficulty using hands for taps, buttons, cutlery, and/or writing: {ACTIONS;DENIES/REPORTS:21021675::"Denies"}  No Rheumatology ROS completed.   PMFS History:  Patient Active Problem List   Diagnosis Date Noted  . History of rheumatoid arthritis seronegative 09/10/2017  . High risk medications (not anticoagulants) long-term use 02/14/2016  . Class 3 obesity in adult 02/14/2016  . Depression 02/14/2016  . Osteoarthritis of both hands 02/13/2016  . Osteoarthritis of both knees 02/13/2016  . DDD (degenerative disc disease), lumbar 02/13/2016  . 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 Hypertension 02/13/2016  . Elevated cholesterol 02/13/2016  . Insomnia 02/13/2016  . Anxiety 02/13/2016  . Sleep apnea 02/13/2016  . Arrhythmia 02/13/2016    Past Medical History:  Diagnosis Date  . Anxiety 02/13/2016  . Arrhythmia 02/13/2016  . DDD (degenerative disc disease), lumbar 02/13/2016  . Elevated cholesterol 02/13/2016  . Hypertension 02/13/2016  . Insomnia 02/13/2016  . Osteoarthritis of both hands 02/13/2016  . Osteoarthritis of both knees 02/13/2016  . RA (rheumatoid arthritis) ([place]) 02/13/2016   Sero Negative  . 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 Sleep apnea 02/13/2016    Family  History  Problem Relation Age of Onset  . Hypertension Father   . Heart attack Father   . Cancer Father        prostate  . Hypertension Sister   . Hypertension Sister   . Hypertension Sister   . Epilepsy Son   . Diabetes Son    Past Surgical History:  Procedure Laterality Date  . A-FLUTTER ABLATION     at [facility] 2018  . CARDIOVERSION  02/10/2018   Social History   Social History Narrative  . Not on file    Objective: Vital Signs: There were no vitals taken for this visit.   Physical Exam   Musculoskeletal Exam: ***  CDAI Exam: CDAI Score: Not documented Patient Global Assessment: Not documented; Provider Global Assessment: Not documented Swollen: Not documented; Tender: Not documented Joint Exam   Not documented   There is currently no information documented on the homunculus. Go to the Rheumatology activity and complete the homunculus joint exam.  Investigation: No additional findings.  Imaging: No results found.  Recent Labs: Lab Results  Component Value Date   NA 141 08/20/2016   K 4.5 08/20/2016   CL 103 08/20/2016   CO2 27 08/20/2016   GLUCOSE 173 (H) 08/20/2016   BUN 19 08/20/2016   CREATININE 0.95 08/20/2016   BILITOT 0.4 08/20/2016   ALKPHOS 86 08/20/2016   AST 15 08/20/2016   ALT 14 08/20/2016   PROT 7.0 08/20/2016   ALBUMIN 4.1 08/20/2016   CALCIUM 9.5 08/20/2016   GFRAA >89 08/20/2016    Speciality Comments: No specialty comments available.  Procedures:  No procedures performed Allergies: Celebrex [celecoxib]; Methotrexate derivatives; and Sulfa antibiotics   Assessment /  Plan:     Visit Diagnoses: Idiopathic chronic gout of multiple sites without tophus - Discus starting Allopurinol  Primary osteoarthritis of both hands  Primary osteoarthritis of both knees  History of rheumatoid arthritis seronegative - In remission.  He has been off of MTX for over 1 year.  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 UDS: 02/17/2018. Narc agreement: 02/17/2018.   History of sleep apnea  History of hypertension  History of hypercholesterolemia   Orders: No orders of the defined types were placed in this encounter.  No orders of the defined types were placed in this encounter.   Face-to-face time spent with patient was *** minutes. Greater than 50% of time was spent in counseling and coordination of care.  Follow-Up Instructions: No follow-ups on file.   Ofilia Neas, PA-C  Note - This record has been created using Dragon software.  Chart creation errors have been sought, but may not always  have been located. Such creation errors do not reflect on  the standard of medical care.

## 2018-03-26 ENCOUNTER — Ambulatory Visit: Payer: Self-pay | Admitting: Physician Assistant

## 2018-04-08 ENCOUNTER — Ambulatory Visit: Payer: Medicare Other | Admitting: Cardiology

## 2018-04-08 ENCOUNTER — Encounter: Payer: Self-pay | Admitting: Cardiology

## 2018-04-08 VITALS — BP 117/86 | HR 100 | Ht 73.0 in | Wt 396.0 lb

## 2018-04-08 DIAGNOSIS — I5033 Acute on chronic diastolic (congestive) heart failure: Secondary | ICD-10-CM | POA: Diagnosis not present

## 2018-04-08 DIAGNOSIS — I10 Essential (primary) hypertension: Secondary | ICD-10-CM

## 2018-04-08 DIAGNOSIS — I4891 Unspecified atrial fibrillation: Secondary | ICD-10-CM | POA: Diagnosis not present

## 2018-04-08 DIAGNOSIS — I4819 Other persistent atrial fibrillation: Secondary | ICD-10-CM | POA: Insufficient documentation

## 2018-04-08 MED ORDER — FUROSEMIDE 40 MG PO TABS: 40.0000 mg | ORAL_TABLET | Freq: Two times a day (BID) | ORAL | 6 refills | Status: DC

## 2018-04-08 NOTE — Patient Instructions (Addendum)
Medication Instructions:   Increase Lasix to 40mg  twice a day.  Continue all other medications.    Labwork:  BMET, TSH, Magnesium - orders given today.  Please do in 1 week.  Office will contact with results via phone or letter.    Testing/Procedures: none  Follow-Up:  You have been referred to:  [facility] Coumadin clinic.  You have been referred to:  Dr. Rayann Heman for Atrial Fibrillation.    3-4 weeks   Any Other Special Instructions Will Be Listed Below (If Applicable). Please call in 1 week with update on daily weights.    If you need a refill on your cardiac medications before your next appointment, please call your pharmacy.

## 2018-04-08 NOTE — Progress Notes (Signed)
Clinical Summary [facility] Rodriguez is a 67 y.o.male seen today as new patient. Previously followed at [facility]   1. Afib and flutter - notes indicate history of typical flutter s/p CTI ablation 03/2016 - history of afib, now on sotalol. Preiviously followed by [facility]  - 02/10/18 DCCV at [facility]. Repeat episodes 2 days later. No specific palpitations but can feel his pulse is irregular.  - doing well on coumadin.    2. HTN - compliant with meds  3. Hyperlipidemia - compliant with statin   4. OSA  5. LE edema - recent LE edema ongoing 3 weeks - some orthopnea, +DOE. - does not check weights regularly at home. 08/2017 374 lbs 03/16/18 389 lbs Today 396 lbs - reports decraese effect with diuretic   Past Medical History:  Diagnosis Date  . Anxiety 02/13/2016  . Arrhythmia 02/13/2016  . DDD (degenerative disc disease), lumbar 02/13/2016  . Elevated cholesterol 02/13/2016  . Hypertension 02/13/2016  . Insomnia 02/13/2016  . Osteoarthritis of both hands 02/13/2016  . Osteoarthritis of both knees 02/13/2016  . RA (rheumatoid arthritis) ([place]) 02/13/2016   Sero Negative  . 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 Sleep apnea 02/13/2016     Allergies  Allergen Reactions  . Celebrex [Celecoxib]   . Methotrexate Derivatives Other (See Comments)    Arrhythmia   . Sulfa Antibiotics      Current Outpatient Medications  Medication Sig Dispense Refill  . acetaminophen (TYLENOL) 500 MG tablet Take by mouth as needed.     . benazepril (LOTENSIN) 20 MG tablet Take by mouth.    . doxepin (SINEQUAN) 25 MG capsule Take by mouth.    . doxycycline (VIBRAMYCIN) 100 MG capsule Take 100 mg by mouth 2 (two) times daily.    . furosemide (LASIX) 40 MG tablet Take by mouth.    Marland Kitchen HYDROcodone-acetaminophen (NORCO/VICODIN) 5-325 MG tablet Take 1 tablet by mouth 2 (two) times daily as needed for moderate pain. 60 tablet 0  . simvastatin (ZOCOR) 20 MG tablet Take by mouth.    . sotalol (BETAPACE) 120 MG  tablet Take by mouth.    . tamsulosin (FLOMAX) 0.4 MG CAPS capsule Take by mouth daily.    Marland Kitchen tiotropium (SPIRIVA) 18 MCG inhalation capsule Place 18 mcg into inhaler and inhale daily.    Marland Kitchen warfarin (COUMADIN) 10 MG tablet Take by mouth.     No current facility-administered medications for this visit.      Past Surgical History:  Procedure Laterality Date  . A-FLUTTER ABLATION     at [facility] 2018  . CARDIOVERSION  02/10/2018     Allergies  Allergen Reactions  . Celebrex [Celecoxib]   . Methotrexate Derivatives Other (See Comments)    Arrhythmia   . Sulfa Antibiotics       Family History  Problem Relation Age of Onset  . Hypertension Father   . Heart attack Father   . Cancer Father        prostate  . Hypertension Sister   . Hypertension Sister   . Hypertension Sister   . Epilepsy Son   . Diabetes Son      Social History [facility] reports that he has been smoking cigarettes. He has a 70.00 pack-year smoking history. He has never used smokeless tobacco. [facility] reports no history of alcohol use.   Review of Systems CONSTITUTIONAL: No weight loss, fever, chills, weakness or fatigue.  HEENT: Eyes: No visual loss, blurred vision, double  vision or yellow sclerae.No hearing loss, sneezing, congestion, runny nose or sore throat.  SKIN: No rash or itching.  CARDIOVASCULAR: per hpi RESPIRATORY: per hpi GASTROINTESTINAL: No anorexia, nausea, vomiting or diarrhea. No abdominal pain or blood.  GENITOURINARY: No burning on urination, no polyuria NEUROLOGICAL: No headache, dizziness, syncope, paralysis, ataxia, numbness or tingling in the extremities. No change in bowel or bladder control.  MUSCULOSKELETAL: No muscle, back pain, joint pain or stiffness.  LYMPHATICS: No enlarged nodes. No history of splenectomy.  PSYCHIATRIC: No history of depression or anxiety.  ENDOCRINOLOGIC: No reports of sweating, cold or heat intolerance. No polyuria or polydipsia.  Marland Kitchen   Physical  Examination Vitals:   04/08/18 1306  BP: 117/86  Pulse: 100  SpO2: 93%   Vitals:   04/08/18 1306  Weight: (!) 396 lb (179.6 kg)  Height: 6\' 1"  (1.854 m)    Gen: resting comfortably, no acute distress HEENT: no scleral icterus, pupils equal round and reactive, no palptable cervical adenopathy,  CV: irreg, no m/r/g, no jvd Resp: Clear to auscultation bilaterally GI: abdomen is soft, non-tender, non-distended, normal bowel sounds, no hepatosplenomegaly MSK: extremities are warm, 2+ bilateral LE edema Skin: warm, no rash Neuro:  no focal deficits Psych: appropriate affect   Diagnostic Studies  12/2017 echo Dr [facility] office LVEF 70%, no significant pathology   Assessment and Plan  1. Aflutter/Afib - history of aflutter ablation. Also with afib previously followed by [facility] - has been on sotaolol. Recurrent afib on sotalol with DCCV in 01/2018, has since gone back in afib as shown on ekg today - we will refer to [facility] to help manage his afib - continue coumadin, given his weight NOACs likely less effective. Wishes to establish with out coumadin clinic, we will refer  2. Acute on chronic diastolic HF - at least 20 lbs weight gain since May. Recent echo showed normal LVEF, probable diastolic HF - change lasix to 40mg  bid, check BMET/Mg/TSH in 1 week - he is to call and update [place] on his home weights next week  3. HTN - at goal, continue current meds    [facility], M.D.

## 2018-04-09 ENCOUNTER — Encounter: Payer: Self-pay | Admitting: Cardiology

## 2018-04-15 ENCOUNTER — Telehealth: Payer: Self-pay | Admitting: *Deleted

## 2018-04-15 NOTE — Telephone Encounter (Signed)
Pt reporting weights per LOV with increase of lasix 40 mg bid - says he will have labs done tomorrow - says weights have remained around the same 390lbs-393lbs

## 2018-04-16 NOTE — Telephone Encounter (Signed)
He may not be absorbing the Lasix well.  Switch Lasix to torsemide 20 mg twice daily.  Await labs and monitor renal function and potassium levels.  He may require potassium supplementation.

## 2018-04-17 LAB — BASIC METABOLIC PANEL
BUN: 16 mg/dL (ref 7–25)
CO2: 35 mmol/L — ABNORMAL HIGH (ref 20–32)
Calcium: 9.2 mg/dL (ref 8.6–10.3)
Chloride: 95 mmol/L — ABNORMAL LOW (ref 98–110)
Creat: 0.92 mg/dL (ref 0.70–1.25)
Glucose, Bld: 150 mg/dL — ABNORMAL HIGH (ref 65–99)
Potassium: 4 mmol/L (ref 3.5–5.3)
Sodium: 140 mmol/L (ref 135–146)

## 2018-04-17 LAB — MAGNESIUM: Magnesium: 2 mg/dL (ref 1.5–2.5)

## 2018-04-17 LAB — TSH: TSH: 1.56 mIU/L (ref 0.40–4.50)

## 2018-04-19 ENCOUNTER — Telehealth: Payer: Self-pay | Admitting: Rheumatology

## 2018-04-19 MED ORDER — TORSEMIDE 20 MG PO TABS: 20.0000 mg | ORAL_TABLET | Freq: Two times a day (BID) | ORAL | 3 refills | Status: DC

## 2018-04-19 MED ORDER — HYDROCODONE-ACETAMINOPHEN 5-325 MG PO TABS: 1.0000 | ORAL_TABLET | Freq: Two times a day (BID) | ORAL | 0 refills | Status: DC | PRN

## 2018-04-19 NOTE — Telephone Encounter (Signed)
Pt aware and voiced understanding - labs in Epic - will forward to provider

## 2018-04-19 NOTE — Telephone Encounter (Signed)
Patient called requesting prescription refill of [facility]cod[facility] [address] in [place].

## 2018-04-19 NOTE — Telephone Encounter (Signed)
Last Visit: 03/16/18 Next visit: 09/16/18 UDS: 02/17/18 Narc Agreement: 02/17/18  Okay to refill Hydrocodone?

## 2018-04-20 NOTE — Telephone Encounter (Signed)
Pt says weights have remained around 390lbs - just started the switch to torsemide today

## 2018-04-20 NOTE — Telephone Encounter (Signed)
Labs look good, how are his weights doing?   Zandra Abts MD

## 2018-04-21 ENCOUNTER — Ambulatory Visit: Payer: Medicare Other | Admitting: Internal Medicine

## 2018-04-21 ENCOUNTER — Encounter: Payer: Self-pay | Admitting: Internal Medicine

## 2018-04-21 VITALS — BP 112/70 | HR 93 | Ht 73.0 in | Wt 394.8 lb

## 2018-04-21 DIAGNOSIS — G4733 Obstructive sleep apnea (adult) (pediatric): Secondary | ICD-10-CM

## 2018-04-21 DIAGNOSIS — I4819 Other persistent atrial fibrillation: Secondary | ICD-10-CM

## 2018-04-21 NOTE — Patient Instructions (Addendum)
Medication Instructions:  Your physician has recommended you make the following change in your medication:  1. STOP Sotalol 2. Please check on the cost of Dofetilide (Tikosyn)  * If you need a refill on your cardiac medications before your next appointment, please call your pharmacy.   Labwork: None ordered  Testing/Procedures: None ordered  Follow-Up: To be determined once Tikosyn admission is scheduled.   Thank you for choosing [facility]!!    Any Other Special Instructions Will Be Listed Below (If Applicable).  Dr. Rayann Heman recommends following a 2000 mg sodium, or less, diet. Two Gram Sodium Diet 2000 mg What is Sodium? Sodium is a mineral found naturally in many foods. The most significant source of sodium in the diet is table salt, which is about 40% sodium.  Processed, convenience, and preserved foods also contain a large amount of sodium.  The body needs only 500 mg of sodium daily to function,  A normal diet provides more than enough sodium even if you do not use salt.  Why Limit Sodium? A build up of sodium in the body can cause thirst, increased blood pressure, shortness of breath, and water retention.  Decreasing sodium in the diet can reduce edema and risk of heart attack or stroke associated with high blood pressure.  Keep in mind that there are many other factors involved in these health problems.  Heredity, obesity, lack of exercise, cigarette smoking, stress and what you eat all play a role.  General Guidelines:  Do not add salt at the table or in cooking.  One teaspoon of salt contains over 2 grams of sodium.  Read food labels  Avoid processed and convenience foods  Ask your dietitian before eating any foods not dicussed in the menu planning guidelines  Consult your physician if you wish to use a salt substitute or a sodium containing medication such as antacids.  Limit milk and milk products to 16 oz (2 cups) per day.  Shopping Hints:  READ LABELS!!  "Dietetic" does not necessarily mean low sodium.  Salt and other sodium ingredients are often added to foods during processing.   Menu Planning Guidelines Food Group Choose More Often Avoid  Beverages (see also the milk group All fruit juices, low-sodium, salt-free vegetables juices, low-sodium carbonated beverages Regular vegetable or tomato juices, commercially softened water used for drinking or cooking  Breads and Cereals Enriched white, wheat, rye and pumpernickel bread, hard rolls and dinner rolls; muffins, cornbread and waffles; most dry cereals, cooked cereal without added salt; unsalted crackers and breadsticks; low sodium or homemade bread crumbs Bread, rolls and crackers with salted tops; quick breads; instant hot cereals; pancakes; commercial bread stuffing; self-rising flower and biscuit mixes; regular bread crumbs or cracker crumbs  Desserts and Sweets Desserts and sweets mad with mild should be within allowance Instant pudding mixes and cake mixes  Fats Butter or margarine; vegetable oils; unsalted salad dressings, regular salad dressings limited to 1 Tbs; light, sour and heavy cream Regular salad dressings containing bacon fat, bacon bits, and salt pork; snack dips made with instant soup mixes or processed cheese; salted nuts  Fruits Most fresh, frozen and canned fruits Fruits processed with salt or sodium-containing ingredient (some dried fruits are processed with sodium sulfites        Vegetables Fresh, frozen vegetables and low- sodium canned vegetables Regular canned vegetables, sauerkraut, pickled vegetables, and others prepared in brine; frozen vegetables in sauces; vegetables seasoned with ham, bacon or salt pork  Condiments, Sauces, Miscellaneous  Salt substitute with physician's approval; pepper, herbs, spices; vinegar, lemon or lime juice; hot pepper sauce; garlic powder, onion powder, low sodium soy sauce (1 Tbs.); low sodium condiments (ketchup, chili sauce, mustard)  in limited amounts (1 tsp.) fresh ground horseradish; unsalted tortilla chips, pretzels, potato chips, popcorn, salsa (1/4 cup) Any seasoning made with salt including garlic salt, celery salt, onion salt, and seasoned salt; sea salt, rock salt, kosher salt; meat tenderizers; monosodium glutamate; mustard, regular soy sauce, barbecue, sauce, chili sauce, teriyaki sauce, steak sauce, Worcestershire sauce, and most flavored vinegars; canned gravy and mixes; regular condiments; salted snack foods, olives, picles, relish, horseradish sauce, catsup   Food preparation: Try these seasonings Meats:    Pork Sage, onion Serve with applesauce  Chicken Poultry seasoning, thyme, parsley Serve with cranberry sauce  Lamb Curry powder, rosemary, garlic, thyme Serve with mint sauce or jelly  Veal Marjoram, basil Serve with current jelly, cranberry sauce  Beef Pepper, bay leaf Serve with dry mustard, unsalted chive butter  Fish [place] leaf, dill Serve with unsalted lemon butter, unsalted parsley butter  Vegetables:    Asparagus Lemon juice   Broccoli Lemon juice   Carrots Mustard dressing parsley, mint, nutmeg, glazed with unsalted butter and sugar   Green beans Marjoram, lemon juice, nutmeg,dill seed   Tomatoes Basil, marjoram, onion   Spice /blend for [employer]" 4 tsp ground thyme 1 tsp ground sage 3 tsp ground rosemary 4 tsp ground marjoram   Test your knowledge 1. A product that says "Salt Free" may still contain sodium. True or False 2. Garlic Powder and Hot Pepper Sauce an be used as alternative seasonings.True or False 3. Processed foods have more sodium than fresh foods.  True or False 4. Canned Vegetables have less sodium than froze True or False  WAYS TO DECREASE YOUR SODIUM INTAKE 1. Avoid the use of added salt in cooking and at the table.  Table salt (and other prepared seasonings which contain salt) is probably one of the greatest sources of sodium in the diet.  Unsalted foods can gain flavor  from the sweet, sour, and butter taste sensations of herbs and spices.  Instead of using salt for seasoning, try the following seasonings with the foods listed.  Remember: how you use them to enhance natural food flavors is limited only by your creativity... Allspice-Meat, fish, eggs, fruit, peas, red and yellow vegetables Almond Extract-Fruit baked goods Anise Seed-Sweet breads, fruit, carrots, beets, cottage cheese, cookies (tastes like licorice) Basil-Meat, fish, eggs, vegetables, rice, vegetables salads, soups, sauces Bay Leaf-Meat, fish, stews, poultry Burnet-Salad, vegetables (cucumber-like flavor) Caraway Seed-Bread, cookies, cottage cheese, meat, vegetables, cheese, rice Cardamon-Baked goods, fruit, soups Celery Powder or seed-Salads, salad dressings, sauces, meatloaf, soup, bread.Do not use  celery salt Chervil-Meats, salads, fish, eggs, vegetables, cottage cheese (parsley-like flavor) Chili Power-Meatloaf, chicken cheese, corn, eggplant, egg dishes Chives-Salads cottage cheese, egg dishes, soups, vegetables, sauces Cilantro-Salsa, casseroles Cinnamon-Baked goods, fruit, pork, lamb, chicken, carrots Cloves-Fruit, baked goods, fish, pot roast, green beans, beets, carrots Coriander-Pastry, cookies, meat, salads, cheese (lemon-orange flavor) Cumin-Meatloaf, fish,cheese, eggs, cabbage,fruit pie (caraway flavor) [employer], fruit, eggs, fish, poultry, cottage cheese, vegetables Dill Seed-Meat, cottage cheese, poultry, vegetables, fish, salads, bread Fennel Seed-Bread, cookies, apples, pork, eggs, fish, beets, cabbage, cheese, Licorice-like flavor Garlic-(buds or powder) Salads, meat, poultry, fish, bread, butter, vegetables, potatoes.Do not  use garlic salt Ginger-Fruit, vegetables, baked goods, meat, fish, poultry Horseradish Root-Meet, vegetables, butter Lemon Juice or Extract-Vegetables, fruit, tea, baked goods, fish salads Mace-Baked goods fruit,  vegetables, fish, poultry  (taste like nutmeg) Maple Extract-Syrups Marjoram-Meat, chicken, fish, vegetables, breads, green salads (taste like Sage) Mint-Tea, lamb, sherbet, vegetables, desserts, carrots, cabbage Mustard, Dry or Seed-Cheese, eggs, meats, vegetables, poultry Nutmeg-Baked goods, fruit, chicken, eggs, vegetables, desserts Onion Powder-Meat, fish, poultry, vegetables, cheese, eggs, bread, rice salads (Do not use   Onion salt) Orange Extract-Desserts, baked goods Oregano-Pasta, eggs, cheese, onions, pork, lamb, fish, chicken, vegetables, green salads Paprika-Meat, fish, poultry, eggs, cheese, vegetables Parsley Flakes-Butter, vegetables, meat fish, poultry, eggs, bread, salads (certain forms may   Contain sodium Pepper-Meat fish, poultry, vegetables, eggs Peppermint Extract-Desserts, baked goods Poppy Seed-Eggs, bread, cheese, fruit dressings, baked goods, noodles, vegetables, cottage  [employer], poultry, meat, fish, cauliflower, turnips,eggs bread Saffron-Rice, bread, veal, chicken, fish, eggs Sage-Meat, fish, poultry, onions, eggplant, tomateos, pork, stews Savory-Eggs, salads, poultry, meat, rice, vegetables, soups, pork Tarragon-Meat, poultry, fish, eggs, butter, vegetables (licorice-like flavor)  Thyme-Meat, poultry, fish, eggs, vegetables, (clover-like flavor), sauces, soups Tumeric-Salads, butter, eggs, fish, rice, vegetables (saffron-like flavor) Vanilla Extract-Baked goods, candy Vinegar-Salads, vegetables, meat marinades Walnut Extract-baked goods, candy  2. Choose your Foods Wisely   The following is a list of foods to avoid which are high in sodium:  Meats-Avoid all smoked, canned, salt cured, dried and kosher meat and fish as well as Anchovies   [employer] meats:Bologna, Liverwurst, Pastrami Canned meat or fish  Marinated herring Caviar    Pepperoni Corned Beef   Pizza Dried chipped beef  Salami Frozen breaded fish or meat Salt  pork Frankfurters or hot dogs  Sardines Gefilte fish   Sausage Ham (boiled ham, Proscuitto Smoked butt    spiced ham)   Spam      TV Dinners Vegetables Canned vegetables (Regular) Relish Canned mushrooms  Sauerkraut Olives    Tomato juice Pickles  Bakery and Dessert Products Canned puddings  Cream pies Cheesecake   Decorated cakes Cookies  Beverages/Juices Tomato juice, regular  Gatorade   V-8 vegetable juice, regular  Breads and Cereals Biscuit mixes   Salted potato chips, corn chips, pretzels Bread stuffing mixes  Salted crackers and rolls Pancake and waffle mixes Self-rising flour  Seasonings Accent    Meat sauces Barbecue sauce  Meat tenderizer Catsup    Monosodium glutamate (MSG) Celery salt   Onion salt Chili sauce   Prepared mustard Garlic salt   Salt, seasoned salt, sea salt Gravy mixes   Soy sauce Horseradish   Steak sauce Ketchup   Tartar sauce Lite salt    Teriyaki sauce Marinade mixes   Worcestershire sauce  Others Baking powder   Cocoa and cocoa mixes Baking soda   Commercial casserole mixes Candy-caramels, chocolate  Dehydrated soups    Bars, fudge,nougats  Instant rice and pasta mixes Canned broth or soup  Maraschino cherries Cheese, aged and processed cheese and cheese spreads  Learning Assessment Quiz  Indicated T (for True) or F (for False) for each of the following statements:  1. _____ Fresh fruits and vegetables and unprocessed grains are generally low in sodium 2. _____ Water may contain a considerable amount of sodium, depending on the source 3. _____ You can always tell if a food is high in sodium by tasting it 4. _____ Certain laxatives my be high in sodium and should be avoided unless prescribed   by a physician or pharmacist 5. _____ Salt substitutes may be used freely by anyone on a sodium restricted diet 6. _____ Sodium is present in table salt, food additives  and as a natural component of   most foods 7. _____ Table salt is  approximately 90% sodium 8. _____ Limiting sodium intake may help prevent excess fluid accumulation in the body 9. _____ On a sodium-restricted diet, seasonings such as bouillon soy sauce, and    cooking wine should be used in place of table salt 10. _____ On an ingredient list, a product which lists monosodium glutamate as the first   ingredient is an appropriate food to include on a low sodium diet  Circle the best answer(s) to the following statements (Hint: there may be more than one correct answer)  11. On a low-sodium diet, some acceptable snack items are:    A. Olives  F. Bean dip   K. Grapefruit juice    B. Salted Pretzels G. Commercial Popcorn   L. Canned peaches    C. Carrot Sticks  H. Bouillon   M. Unsalted nuts   D. [place] fries  I. Peanut butter crackers N. Salami   E. Sweet pickles J. Tomato Juice   O. Pizza  12.  Seasonings that may be used freely on a reduced - sodium diet include   A. Lemon wedges F.Monosodium glutamate K. Celery seed    B.Soysauce   G. Pepper   L. Mustard powder   C. Sea salt  H. Cooking wine  M. Onion flakes   D. Vinegar  E. Prepared horseradish N. Salsa   E. Sage   J. Worcestershire sauce  O. Chutney

## 2018-04-21 NOTE — Progress Notes (Signed)
Electrophysiology Office Note   Date:  04/21/2018   ID:  Jesus Rodriguez, DOB [birth year], MRN [id number]  PCP:  Patient, [name]  Cardiologist:  Dr Harl Bowie Primary Electrophysiologist: Oliver Barre, PA    CC: afib   History of Present Illness: Jesus Rodriguez is a 68 y.o. male who presents today for electrophysiology evaluation.   He is referred by Dr Harl Bowie for EP consultation regarding atrial arrhythmias.  The patient was previously followed at [facility] and [place].  He underwent CTI ablation 03/2016.  He has a h/o persistent afib and has been previously managed with sotalol. He was in his usual state of health until October 2019 when he noted his pulse was irregular. He underwent DCCV on 02/10/18.  The patient has required prior cardioversions.  He is anticoagulated with coumadin.  He has OSA and is compliant with his CPAP therapy. He denies alcohol use. He reports that he feels short of breath but this appears to have been his baseline for several months/years. He is a long time smoker. He also reports increased lower extremity edema and recently started on torsemide by Dr Harl Bowie.  Today, he denies symptoms of palpitations, chest pain, orthopnea, PND, claudication, dizziness, presyncope, syncope, bleeding, or neurologic sequela. The patient is tolerating medications without difficulties and is otherwise without complaint today. +shortness of breath, edema   Past Medical History:  Diagnosis Date  . Anxiety 02/13/2016  . Atrial flutter ([place]) 02/13/2016   s/p CTI ablation at [facility]  . DDD (degenerative disc disease), lumbar 02/13/2016  . Elevated cholesterol 02/13/2016  . Hypertension 02/13/2016  . Insomnia 02/13/2016  . Osteoarthritis of both hands 02/13/2016  . Osteoarthritis of both knees 02/13/2016  . Persistent atrial fibrillation   . RA (rheumatoid arthritis) ([place]) 02/13/2016   Sero Negative  . 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 Sleep apnea 02/13/2016   Past Surgical History:    Procedure Laterality Date  . A-FLUTTER ABLATION     at [facility] 2018  . CARDIOVERSION  02/10/2018     Current Outpatient Medications  Medication Sig Dispense Refill  . acetaminophen (TYLENOL) 500 MG tablet Take by mouth as needed.     . benazepril (LOTENSIN) 20 MG tablet Take by mouth.    . doxepin (SINEQUAN) 25 MG capsule Take by mouth.    . doxycycline (VIBRAMYCIN) 100 MG capsule Take 100 mg by mouth 2 (two) times daily.    Marland Kitchen HYDROcodone-acetaminophen (NORCO/VICODIN) 5-325 MG tablet Take 1 tablet by mouth 2 (two) times daily as needed for moderate pain. 60 tablet 0  . simvastatin (ZOCOR) 20 MG tablet Take by mouth.    . tamsulosin (FLOMAX) 0.4 MG CAPS capsule Take by mouth daily.    Marland Kitchen tiotropium (SPIRIVA) 18 MCG inhalation capsule Place 18 mcg into inhaler and inhale daily.    Marland Kitchen torsemide (DEMADEX) 20 MG tablet Take 1 tablet (20 mg total) by mouth 2 (two) times daily. 60 tablet 3  . warfarin (COUMADIN) 10 MG tablet Take by mouth.    . sotalol (BETAPACE) 120 MG tablet Take by mouth.     No current facility-administered medications for this visit.     Allergies:   Celebrex [celecoxib]; Methotrexate derivatives; and Sulfa antibiotics   Social History:  The patient  reports that he has been smoking cigarettes. He has a 70.00 pack-year smoking history. He has never used smokeless tobacco. He reports that he does not drink alcohol or use drugs.   Family History:  The patient's  family history includes Cancer in his father; Diabetes in his son; Epilepsy in his son; Heart attack in his father; Hypertension in his father, sister, sister, and sister.    ROS:  Please see the history of present illness.   All other systems are personally reviewed and negative.    PHYSICAL EXAM: VS:  BP 112/70   Pulse 93   Ht 6\' 1"  (1.854 m)   Wt (!) 394 lb 12.8 oz (179.1 kg)   SpO2 97%   BMI 52.09 kg/m  , BMI Body mass index is 52.09 kg/m.  GEN: morbidly obese, in no acute distress  HEENT: normal   Neck: no JVD, carotid bruits, or masses Cardiac: irregularly irregular; no murmurs, rubs, or gallops, 1+ edema bilateral Respiratory:  clear to auscultation bilaterally, normal work of breathing, diminished breath sounds GI: soft, nontender, nondistended, + BS MS: no deformity or atrophy  Skin: warm and dry  Neuro:  Strength and sensation are intact Psych: euthymic mood, full affect  EKG:  EKG is ordered today. The ekg ordered today is personally reviewed and shows atrial fibrilation HR 93, QRS 86, QTc 422, NST   Recent Labs: 04/16/2018: BUN 16; Creat 0.92; Magnesium 2.0; Potassium 4.0; Sodium 140; TSH 1.56  personally reviewed   Lipid Panel  No results found for: CHOL, TRIG, HDL, CHOLHDL, VLDL, LDLCALC, LDLDIRECT personally reviewed   Wt Readings from Last 3 Encounters:  04/21/18 (!) 394 lb 12.8 oz (179.1 kg)  04/08/18 (!) 396 lb (179.6 kg)  03/16/18 (!) 389 lb (176.4 kg)      Other studies personally reviewed: Additional studies/ records that were reviewed today include: Notes in Epic reviewed. [facility] notes in [place]:  1.  Persistent atrial fibrillation/flutter S/p flutter ablation 2017 at [facility] Has had persistent afib since October 2019. S/p cardioversion 01/2018 with quick return to afib. Sotalol failure.  Given morbid obesity with BMI of 52, he is not a candidate for catheter ablation. Spoke to patient about various therapeutic options including switching to Tikosyn vs rate control.  Patient would like to be admitted for Tikosyn loading. QT 422 Stop sotalol Continue warfarin for CHADS2VASC score of 2 (age, HTN)   2. HTN Stable today, no changes  3. Lower extremity edema Recent change to torsimide by Dr Harl Bowie.  4. Obesity Body mass index is 52.09 kg/m.  Lifestyle modifications encouraged. We discussed meeting with nutrition to help with weight loss. Physical activity as he is able.  5. OSA Compliant with CPAP  therapy   Follow-up:  Will schedule for admission for Tikosyn.    Current medicines are reviewed at length with the patient today.   The patient does not have concerns regarding his medicines.  The following changes were made today:  Stop sotalol, admit for Tikosyn    Army Fossa MD 04/21/2018 4:31 PM     [address] [place] [address] [phone] (office) [phone] (fax)

## 2018-04-22 ENCOUNTER — Ambulatory Visit (INDEPENDENT_AMBULATORY_CARE_PROVIDER_SITE_OTHER): Payer: Medicare Other | Admitting: Pharmacist

## 2018-04-22 DIAGNOSIS — I4891 Unspecified atrial fibrillation: Secondary | ICD-10-CM

## 2018-04-22 DIAGNOSIS — Z5181 Encounter for therapeutic drug level monitoring: Secondary | ICD-10-CM

## 2018-04-22 LAB — POCT INR: INR: 3.1 — AB (ref 2.0–3.0)

## 2018-04-22 NOTE — Patient Instructions (Addendum)
Description   Continue taking 10mg  daily, except 5mg  on Tuesday and nothing on Saturday. Recheck INR in 1 week pending Tikosyn.      A full discussion of the nature of anticoagulants has been carried out.  A benefit risk analysis has been presented to the patient, so that they understand the justification for choosing anticoagulation at this time. The need for frequent and regular monitoring, precise dosage adjustment and compliance is stressed.  Side effects of potential bleeding are discussed.  The patient should avoid any OTC items containing aspirin or ibuprofen, and should avoid great swings in general diet.  Avoid alcohol consumption.  Call if any signs of abnormal bleeding.

## 2018-04-27 ENCOUNTER — Telehealth: Payer: Self-pay | Admitting: Pharmacist

## 2018-04-27 ENCOUNTER — Telehealth: Payer: Self-pay | Admitting: Cardiology

## 2018-04-27 NOTE — Telephone Encounter (Signed)
Medication list reviewed in anticipation of upcoming Tikosyn initiation. Patient is still taking sotalol per medication list, which will need to be discontinued prior to admission for Tikosyn.  He also takes doxepin which can be Qt-prolonging and will need to monitored if dose is changed. He is not taking any other contraindicated or QTc prolonging medications. Potassium earlier this month was 4.0 and mag 2.0.   Patient is anticoagulated on warfarin and is pending weekly checks, first check on 1/9 was slightly supratherapeutic.    Patient will need to be counseled to avoid use of Benadryl while on Tikosyn and in the 2-3 days prior to Tikosyn initiation.

## 2018-04-27 NOTE — Telephone Encounter (Signed)
Pt aware and voiced understanding - will update [place] on Friday with weights

## 2018-04-27 NOTE — Telephone Encounter (Signed)
393lbs weight today - swelling in both feet and legs - swelling worse in the left foot - still taking torsemide 20 mg bid

## 2018-04-27 NOTE — Telephone Encounter (Signed)
Increase torsemide to 40mg  bid and update [place] on weights Friday. If he could please have him tell us his weights each day when he calls Friday so we can better see the trend on his home scale   Zandra Abts MD

## 2018-04-27 NOTE — Telephone Encounter (Signed)
Patient states that he was started on Torsemide at last visit but it isn't working. Patient states the is still gaining weight. / tg

## 2018-04-28 NOTE — Addendum Note (Signed)
Addended by: Juluis Mire on: 04/28/2018 10:31 AM   Modules accepted: Orders

## 2018-04-28 NOTE — Telephone Encounter (Signed)
Patient stopped sotalol 04/21/18. He understands to notify if any increase dose of doxepin but patient states he has been on the same dose for years. Pt will have BMET/mg redrawn at appt with Dr. Harl Bowie 1/22 since recent increase in torsemide. Pt will have INR drawn morning of admit in [place].

## 2018-04-29 ENCOUNTER — Ambulatory Visit (INDEPENDENT_AMBULATORY_CARE_PROVIDER_SITE_OTHER): Payer: Medicare Other | Admitting: Pharmacist

## 2018-04-29 DIAGNOSIS — I4891 Unspecified atrial fibrillation: Secondary | ICD-10-CM | POA: Diagnosis not present

## 2018-04-29 DIAGNOSIS — Z5181 Encounter for therapeutic drug level monitoring: Secondary | ICD-10-CM | POA: Diagnosis not present

## 2018-04-29 LAB — POCT INR: INR: 3.5 — AB (ref 2.0–3.0)

## 2018-04-29 NOTE — Patient Instructions (Signed)
Description   Take only 1/2 tablet today then Continue taking 10mg  daily, except 5mg  on Tuesday and nothing on Saturday. Recheck INR in 1 week pending Tikosyn.

## 2018-04-30 ENCOUNTER — Telehealth: Payer: Self-pay | Admitting: Cardiology

## 2018-04-30 MED ORDER — TORSEMIDE 20 MG PO TABS: 40.0000 mg | ORAL_TABLET | Freq: Two times a day (BID) | ORAL | Status: DC

## 2018-04-30 NOTE — Telephone Encounter (Signed)
Patient called to give update on his weight.

## 2018-04-30 NOTE — Telephone Encounter (Signed)
Was told to call office back today with update on his weights -  1/14 - 393 - Torsemide dose increased to 40mg  twice a day    1/15 - 389 1/16 - 387 1/17 - 388  States that he is feeling a lot better today.

## 2018-05-03 NOTE — Telephone Encounter (Signed)
Continue current torsemide dose until our f/u this week, if he could bring his documented weights with him to our appt   J Carole Doner MD

## 2018-05-03 NOTE — Telephone Encounter (Signed)
Patient notified and verbalized understanding. 

## 2018-05-04 ENCOUNTER — Other Ambulatory Visit (HOSPITAL_COMMUNITY): Payer: Self-pay | Admitting: *Deleted

## 2018-05-04 DIAGNOSIS — I4819 Other persistent atrial fibrillation: Secondary | ICD-10-CM

## 2018-05-05 ENCOUNTER — Ambulatory Visit (INDEPENDENT_AMBULATORY_CARE_PROVIDER_SITE_OTHER): Payer: Medicare Other | Admitting: Pharmacist

## 2018-05-05 ENCOUNTER — Other Ambulatory Visit (HOSPITAL_COMMUNITY)
Admission: RE | Admit: 2018-05-05 | Discharge: 2018-05-05 | Disposition: A | Discharge status: Home / Self Care | Payer: Medicare Other | Source: Ambulatory Visit | Attending: Nurse Practitioner | Admitting: Nurse Practitioner

## 2018-05-05 ENCOUNTER — Ambulatory Visit: Payer: Medicare Other | Admitting: Cardiology

## 2018-05-05 ENCOUNTER — Encounter: Payer: Self-pay | Admitting: Cardiology

## 2018-05-05 VITALS — BP 120/62 | HR 121 | Ht 73.0 in | Wt 391.6 lb

## 2018-05-05 DIAGNOSIS — I5033 Acute on chronic diastolic (congestive) heart failure: Secondary | ICD-10-CM

## 2018-05-05 DIAGNOSIS — I4891 Unspecified atrial fibrillation: Secondary | ICD-10-CM

## 2018-05-05 DIAGNOSIS — Z5181 Encounter for therapeutic drug level monitoring: Secondary | ICD-10-CM | POA: Diagnosis not present

## 2018-05-05 DIAGNOSIS — I4819 Other persistent atrial fibrillation: Secondary | ICD-10-CM | POA: Diagnosis present

## 2018-05-05 LAB — BASIC METABOLIC PANEL
Anion gap: 10 (ref 5–15)
BUN: 19 mg/dL (ref 8–23)
CO2: 30 mmol/L (ref 22–32)
Calcium: 9 mg/dL (ref 8.9–10.3)
Chloride: 95 mmol/L — ABNORMAL LOW (ref 98–111)
Creatinine, Ser: 0.96 mg/dL (ref 0.61–1.24)
GFR calc Af Amer: >60 mL/min (ref >60)
GFR calc non Af Amer: >60 mL/min (ref >60)
Glucose, Bld: 126 mg/dL — ABNORMAL HIGH (ref 70–99)
Potassium: 4.1 mmol/L (ref 3.5–5.1)
Sodium: 135 mmol/L (ref 135–145)

## 2018-05-05 LAB — POCT INR: INR: 2.9 (ref 2.0–3.0)

## 2018-05-05 LAB — MAGNESIUM: Magnesium: 2.2 mg/dL (ref 1.7–2.4)

## 2018-05-05 MED ORDER — METOPROLOL TARTRATE 25 MG PO TABS: 25.0000 mg | ORAL_TABLET | Freq: Two times a day (BID) | ORAL | 3 refills | Status: DC

## 2018-05-05 NOTE — Patient Instructions (Signed)
Description   Continue taking 10mg  daily, except 5mg  on Tuesday and nothing on Saturday. Recheck INR in 1 week pending Tikosyn.

## 2018-05-05 NOTE — Patient Instructions (Addendum)
Medication Instructions:  Your physician has recommended you make the following change in your medication:  Start Loprssor 25 mg Two Times Daily   If you need a refill on your cardiac medications before your next appointment, please call your pharmacy.   Lab work: NONE  If you have labs (blood work) drawn today and your tests are completely normal, you will receive your results only by: Marland Kitchen MyChart Message (if you have MyChart) OR . A paper copy in the mail If you have any lab test that is abnormal or we need to change your treatment, we will call you to review the results.  Testing/Procedures: NONE   Follow-Up: At [facility], you and your health needs are our priority.  As part of our continuing mission to provide you with exceptional heart care, we have created designated Provider Care Teams.  These Care Teams include your primary Cardiologist (physician) and Advanced Practice Providers (APPs -  Physician Assistants and Nurse Practitioners) who all work together to provide you with the care you need, when you need it. You will need a follow up appointment in 2 months.  Please call our office 2 months in advance to schedule this appointment.  You may see Carlyle Dolly, MD or one of the following Advanced Practice Providers on your designated Care Team:   Bernerd Pho, PA-C [facility]) . Ermalinda Barrios, PA-C ([place])  Any Other Special Instructions Will Be Listed Below (If Applicable). Thank you for choosing [facility]!

## 2018-05-05 NOTE — Addendum Note (Signed)
Addended by: Erskine Emery on: 05/05/2018 03:39 PM   Modules accepted: Level of Service

## 2018-05-05 NOTE — Progress Notes (Signed)
Clinical Summary Jesus Rodriguez is a 68 y.o.male seen today as new patient. Previously followed at [facility]   1. Afib and flutter - notes indicate history of typical flutter s/p CTI ablation 03/2016 - history of afib, now on sotalol. Preiviously followed by [facility]  - 02/10/18 DCCV at [facility] however reverted back to afib shortly after.  - seen by EP, plans for tikosyn loading within the near future.     2. Acute on chronic diastolic HF - recent LE edema ongoing 3 weeks and weight gain - some orthopnea, +DOE. - does not check weights regularly at home. 08/2017 374 lbs 03/16/18 389 lbs Today 396 lbs   - last visit we changed lasix to 40mg  bid without improvement, we later changed to torsemide 20mg  bid and then increased to 40mg  bid just on Jan 14.  - - weight down from 396 to 391 lbs since our last visit.  - due for repeat labs today.  - limited echo planned once afib is controlled   Past Medical History:  Diagnosis Date  . Anxiety 02/13/2016  . Atrial flutter ([place]) 02/13/2016   s/p CTI ablation at [facility]  . DDD (degenerative disc disease), lumbar 02/13/2016  . Elevated cholesterol 02/13/2016  . Hypertension 02/13/2016  . Insomnia 02/13/2016  . Osteoarthritis of both hands 02/13/2016  . Osteoarthritis of both knees 02/13/2016  . Persistent atrial fibrillation   . RA (rheumatoid arthritis) ([place]) 02/13/2016   Sero Negative  . 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 Sleep apnea 02/13/2016     Allergies  Allergen Reactions  . Celebrex [Celecoxib]   . Methotrexate Derivatives Other (See Comments)    Arrhythmia   . Sulfa Antibiotics      Current Outpatient Medications  Medication Sig Dispense Refill  . acetaminophen (TYLENOL) 500 MG tablet Take by mouth as needed.     . benazepril (LOTENSIN) 20 MG tablet Take by mouth.    . doxepin (SINEQUAN) 25 MG capsule Take by mouth.    . doxycycline (VIBRAMYCIN) 100 MG capsule Take 100 mg by mouth 2 (two) times daily.    Marland Kitchen  HYDROcodone-acetaminophen (NORCO/VICODIN) 5-325 MG tablet Take 1 tablet by mouth 2 (two) times daily as needed for moderate pain. 60 tablet 0  . simvastatin (ZOCOR) 20 MG tablet Take by mouth.    . tamsulosin (FLOMAX) 0.4 MG CAPS capsule Take by mouth daily.    Marland Kitchen tiotropium (SPIRIVA) 18 MCG inhalation capsule Place 18 mcg into inhaler and inhale daily.    Marland Kitchen torsemide (DEMADEX) 20 MG tablet Take 2 tablets (40 mg total) by mouth 2 (two) times daily.    Marland Kitchen warfarin (COUMADIN) 10 MG tablet Take by mouth.     No current facility-administered medications for this visit.      Past Surgical History:  Procedure Laterality Date  . A-FLUTTER ABLATION     at [facility] 2018  . CARDIOVERSION  02/10/2018     Allergies  Allergen Reactions  . Celebrex [Celecoxib]   . Methotrexate Derivatives Other (See Comments)    Arrhythmia   . Sulfa Antibiotics       Family History  Problem Relation Age of Onset  . Hypertension Father   . Heart attack Father   . Cancer Father        prostate  . Hypertension Sister   . Hypertension Sister   . Hypertension Sister   . Epilepsy Son   . Diabetes Son      Social  History Jesus Rodriguez reports that he has been smoking cigarettes. He has a 70.00 pack-year smoking history. He has never used smokeless tobacco. Jesus Rodriguez reports no history of alcohol use.   Review of Systems CONSTITUTIONAL: No weight loss, fever, chills, weakness or fatigue.  HEENT: Eyes: No visual loss, blurred vision, double vision or yellow sclerae.No hearing loss, sneezing, congestion, runny nose or sore throat.  SKIN: No rash or itching.  CARDIOVASCULAR: per hpi RESPIRATORY: No shortness of breath, cough or sputum.  GASTROINTESTINAL: No anorexia, nausea, vomiting or diarrhea. No abdominal pain or blood.  GENITOURINARY: No burning on urination, no polyuria NEUROLOGICAL: No headache, dizziness, syncope, paralysis, ataxia, numbness or tingling in the extremities. No change in bowel or bladder  control.  MUSCULOSKELETAL: No muscle, back pain, joint pain or stiffness.  LYMPHATICS: No enlarged nodes. No history of splenectomy.  PSYCHIATRIC: No history of depression or anxiety.  ENDOCRINOLOGIC: No reports of sweating, cold or heat intolerance. No polyuria or polydipsia.  Marland Kitchen   Physical Examination Vitals:   05/05/18 1324  BP: 120/62  Pulse: (!) 121  SpO2: 93%   Vitals:   05/05/18 1324  Weight: (!) 391 lb 9.6 oz (177.6 kg)  Height: 6\' 1"  (1.854 m)    Gen: resting comfortably, no acute distress HEENT: no scleral icterus, pupils equal round and reactive, no palptable cervical adenopathy,  CV: irreg, tachy, no m/r/g. Cannot visualized JVD due to neck girth Resp: Clear to auscultation bilaterally GI: abdomen is soft, non-tender, non-distended, normal bowel sounds, no hepatosplenomegaly MSK: extremities are warm, 1-2+ bilateral LE edema Skin: warm, no rash Neuro:  no focal deficits Psych: appropriate affect   Diagnostic Studies 12/2017 echo Dr Cammy Copa office LVEF 70%, no significant pathology    Assessment and Plan  1. Aflutter/Afib - history of aflutter ablation. Also with afib previously followed by [facility] - had been on sotaolol. Recurrent afib on sotalol with DCCV in 01/2018, has since gone back in afib  - EP plans for dofetilide admission and loading - EKG today shows afib rates 130s, start lopressor 25mg  bid in short term   2. Acute on chronic diastolic HF - at least 20 lbs weight gain since May. 12/2017 echo showed normal LVEF, probable diastolic HF exacerbated by uncontrolled afib - weights trending down on torsemide 40mg  bid, f/u labs today - recheck echo once afib better controlled to evaluate for possibly tachy mediated CM.  - continue oral diuretics, may warrant some IV diuresis during upcoming admission for tikasyn loading pending his volume status at that time.       Arnoldo Lenis, M.D.

## 2018-05-11 ENCOUNTER — Encounter (HOSPITAL_COMMUNITY): Payer: Self-pay | Admitting: Nurse Practitioner

## 2018-05-11 ENCOUNTER — Ambulatory Visit (INDEPENDENT_AMBULATORY_CARE_PROVIDER_SITE_OTHER): Payer: Medicare Other | Admitting: Pharmacist

## 2018-05-11 ENCOUNTER — Other Ambulatory Visit: Payer: Self-pay

## 2018-05-11 ENCOUNTER — Encounter (HOSPITAL_COMMUNITY): Payer: Self-pay | Admitting: General Practice

## 2018-05-11 ENCOUNTER — Ambulatory Visit (HOSPITAL_COMMUNITY)
Admission: RE | Admit: 2018-05-11 | Discharge: 2018-05-11 | Disposition: A | Discharge status: Home / Self Care | Payer: Medicare Other | Source: Ambulatory Visit | Attending: Nurse Practitioner | Admitting: Nurse Practitioner

## 2018-05-11 ENCOUNTER — Inpatient Hospital Stay (HOSPITAL_COMMUNITY)
Admission: AD | Admit: 2018-05-11 | Discharge: 2018-05-14 | DRG: 309 | Disposition: A | Discharge status: Home / Self Care | Payer: Medicare Other | Attending: Internal Medicine | Admitting: Internal Medicine

## 2018-05-11 VITALS — BP 110/50 | HR 126 | Ht 73.0 in | Wt 394.0 lb

## 2018-05-11 DIAGNOSIS — M069 Rheumatoid arthritis, unspecified: Secondary | ICD-10-CM | POA: Diagnosis present

## 2018-05-11 DIAGNOSIS — E785 Hyperlipidemia, unspecified: Secondary | ICD-10-CM | POA: Diagnosis present

## 2018-05-11 DIAGNOSIS — Z7901 Long term (current) use of anticoagulants: Secondary | ICD-10-CM

## 2018-05-11 DIAGNOSIS — Z6841 Body Mass Index (BMI) 40.0 and over, adult: Secondary | ICD-10-CM

## 2018-05-11 DIAGNOSIS — I4819 Other persistent atrial fibrillation: Secondary | ICD-10-CM

## 2018-05-11 DIAGNOSIS — I4891 Unspecified atrial fibrillation: Secondary | ICD-10-CM

## 2018-05-11 DIAGNOSIS — Z5181 Encounter for therapeutic drug level monitoring: Secondary | ICD-10-CM | POA: Diagnosis not present

## 2018-05-11 DIAGNOSIS — I10 Essential (primary) hypertension: Secondary | ICD-10-CM | POA: Diagnosis present

## 2018-05-11 DIAGNOSIS — Z8549 Personal history of malignant neoplasm of other male genital organs: Secondary | ICD-10-CM | POA: Diagnosis not present

## 2018-05-11 DIAGNOSIS — Z79899 Other long term (current) drug therapy: Secondary | ICD-10-CM

## 2018-05-11 DIAGNOSIS — G4733 Obstructive sleep apnea (adult) (pediatric): Secondary | ICD-10-CM | POA: Diagnosis present

## 2018-05-11 DIAGNOSIS — Z23 Encounter for immunization: Secondary | ICD-10-CM

## 2018-05-11 DIAGNOSIS — F17210 Nicotine dependence, cigarettes, uncomplicated: Secondary | ICD-10-CM | POA: Diagnosis present

## 2018-05-11 DIAGNOSIS — E7800 Pure hypercholesterolemia, unspecified: Secondary | ICD-10-CM | POA: Diagnosis present

## 2018-05-11 LAB — BASIC METABOLIC PANEL
Anion gap: 13 (ref 5–15)
BUN: 20 mg/dL (ref 8–23)
CO2: 30 mmol/L (ref 22–32)
Calcium: 9.2 mg/dL (ref 8.9–10.3)
Chloride: 95 mmol/L — ABNORMAL LOW (ref 98–111)
Creatinine, Ser: 0.98 mg/dL (ref 0.61–1.24)
GFR calc Af Amer: >60 mL/min (ref >60)
GFR calc non Af Amer: >60 mL/min (ref >60)
Glucose, Bld: 193 mg/dL — ABNORMAL HIGH (ref 70–99)
Potassium: 4 mmol/L (ref 3.5–5.1)
Sodium: 138 mmol/L (ref 135–145)

## 2018-05-11 LAB — PROTIME-INR
INR: 2.94
Prothrombin Time: 30.3 seconds — ABNORMAL HIGH (ref 11.4–15.2)

## 2018-05-11 LAB — POCT INR: INR: 3.6 — AB (ref 2.0–3.0)

## 2018-05-11 LAB — MAGNESIUM: Magnesium: 2.3 mg/dL (ref 1.7–2.4)

## 2018-05-11 MED ORDER — DOXEPIN HCL 25 MG PO CAPS
25.0000 mg | ORAL_CAPSULE | Freq: Every day | ORAL | Status: DC
  Administered 2018-05-11 – 2018-05-13 (×3): 25 mg via ORAL
  Filled 2018-05-11 (×4): qty 1

## 2018-05-11 MED ORDER — TORSEMIDE 20 MG PO TABS
40.0000 mg | ORAL_TABLET | Freq: Two times a day (BID) | ORAL | Status: DC
  Administered 2018-05-11 – 2018-05-14 (×5): 40 mg via ORAL
  Filled 2018-05-11 (×6): qty 2

## 2018-05-11 MED ORDER — SODIUM CHLORIDE 0.9% FLUSH
3.0000 mL | Freq: Two times a day (BID) | INTRAVENOUS | Status: DC
  Administered 2018-05-11 – 2018-05-14 (×4): 3 mL via INTRAVENOUS

## 2018-05-11 MED ORDER — DOFETILIDE 500 MCG PO CAPS
500.0000 ug | ORAL_CAPSULE | Freq: Two times a day (BID) | ORAL | Status: DC
  Administered 2018-05-11 – 2018-05-14 (×6): 500 ug via ORAL
  Filled 2018-05-11 (×6): qty 1

## 2018-05-11 MED ORDER — TAMSULOSIN HCL 0.4 MG PO CAPS
0.4000 mg | ORAL_CAPSULE | Freq: Every day | ORAL | Status: DC
  Administered 2018-05-12 – 2018-05-14 (×3): 0.4 mg via ORAL
  Filled 2018-05-11 (×4): qty 1

## 2018-05-11 MED ORDER — TIOTROPIUM BROMIDE MONOHYDRATE 18 MCG IN CAPS
18.0000 ug | ORAL_CAPSULE | Freq: Every day | RESPIRATORY_TRACT | Status: DC
  Filled 2018-05-11: qty 5

## 2018-05-11 MED ORDER — SODIUM CHLORIDE 0.9% FLUSH: 3.0000 mL | INTRAVENOUS | Status: DC | PRN

## 2018-05-11 MED ORDER — METOPROLOL TARTRATE 25 MG PO TABS
25.0000 mg | ORAL_TABLET | Freq: Two times a day (BID) | ORAL | Status: DC
  Administered 2018-05-11 – 2018-05-14 (×6): 25 mg via ORAL
  Filled 2018-05-11 (×6): qty 1

## 2018-05-11 MED ORDER — WARFARIN SODIUM 10 MG PO TABS: 10.0000 mg | ORAL_TABLET | Freq: Every day | ORAL | Status: DC

## 2018-05-11 MED ORDER — SODIUM CHLORIDE 0.9 % IV SOLN
250.0000 mL | INTRAVENOUS | Status: DC | PRN
  Administered 2018-05-13: 250 mL via INTRAVENOUS

## 2018-05-11 MED ORDER — SIMVASTATIN 20 MG PO TABS
20.0000 mg | ORAL_TABLET | Freq: Every day | ORAL | Status: DC
  Administered 2018-05-11 – 2018-05-13 (×3): 20 mg via ORAL
  Filled 2018-05-11 (×3): qty 1

## 2018-05-11 MED ORDER — UMECLIDINIUM BROMIDE 62.5 MCG/INH IN AEPB
1.0000 | INHALATION_SPRAY | Freq: Every day | RESPIRATORY_TRACT | Status: DC
  Administered 2018-05-12 – 2018-05-14 (×3): 1 via RESPIRATORY_TRACT
  Filled 2018-05-11: qty 7

## 2018-05-11 MED ORDER — HYDROCODONE-ACETAMINOPHEN 5-325 MG PO TABS: 1.0000 | ORAL_TABLET | Freq: Two times a day (BID) | ORAL | Status: DC | PRN

## 2018-05-11 MED ORDER — WARFARIN - PHARMACIST DOSING INPATIENT
Freq: Every day | Status: DC
  Administered 2018-05-12: 18:00:00

## 2018-05-11 MED ORDER — BENAZEPRIL HCL 10 MG PO TABS
20.0000 mg | ORAL_TABLET | Freq: Every day | ORAL | Status: DC
  Administered 2018-05-12 – 2018-05-14 (×3): 20 mg via ORAL
  Filled 2018-05-11 (×3): qty 2

## 2018-05-11 MED ORDER — DOXYCYCLINE HYCLATE 100 MG PO TABS
100.0000 mg | ORAL_TABLET | Freq: Every day | ORAL | Status: DC
  Administered 2018-05-12 – 2018-05-14 (×3): 100 mg via ORAL
  Filled 2018-05-11 (×3): qty 1

## 2018-05-11 NOTE — Progress Notes (Signed)
Pt is on NIV at this time tolerating it well. Settings were adjusted to patient home regimen. CPAP Ramp pressure of 15cmh20. Patient wishes to have no humidity. States that he does not use humidity at home. No complications noted.

## 2018-05-11 NOTE — Patient Instructions (Signed)
Description   Continue taking 10mg  daily, except 5mg  on Tuesday and nothing on Saturday once discharged from hospital. Recheck INR in 2 weeks.

## 2018-05-11 NOTE — Progress Notes (Signed)
Primary Care Physician: Patient, [name] Primary Cardiologist: Dr Harl Bowie Primary Electrophysiologist: Dr Rayann Heman Referring Physician: Dr Rayann Heman   Jesus Rodriguez is a 68 y.o. male with a history of persistent atrial fibrillation, OSA, atrial flutter s/p ablation 2017, HTN, HLD, and RA who presents for consultation in the [facility].  The patient is s/p atrial flutter ablation in 2017 at [facility]. Maintained on sotalol. He did well until October 2019 he he noted an irregular pulse. Underwent DCCV 02/10/18 with quick return to afib. He continues to have increased shortness of breath and edema.   Today, he denies symptoms of palpitations, chest pain, orthopnea, PND, dizziness, presyncope, syncope, snoring, daytime somnolence, bleeding, or neurologic sequela. The patient is tolerating medications without difficulties and is otherwise without complaint today.    Atrial Fibrillation Risk Factors:  he does have symptoms or diagnosis of sleep apnea. he is compliant with CPAP therapy.  he does not have a history of rheumatic fever.  he does not have a history of alcohol use.  he has a BMI of Body mass index is 51.98 kg/m.Marland Kitchen Filed Weights   05/11/18 1055  Weight: (!) 178.7 kg     Atrial Fibrillation Management history:  Previous antiarrhythmic drugs: sotalol  Previous cardioversions: 02/10/18  Previous ablations: Flutter ablation 2017 at [facility] score: 2  Anticoagulation history: Warfarin   Past Medical History:  Diagnosis Date  . Anxiety 02/13/2016  . Atrial flutter ([place]) 02/13/2016   s/p CTI ablation at [facility]  . DDD (degenerative disc disease), lumbar 02/13/2016  . Elevated cholesterol 02/13/2016  . Hypertension 02/13/2016  . Insomnia 02/13/2016  . Osteoarthritis of both hands 02/13/2016  . Osteoarthritis of both knees 02/13/2016  . Persistent atrial fibrillation   . RA (rheumatoid arthritis) ([place]) 02/13/2016   Sero Negative  . Scrotal  cancer ([place]) 02/13/2016  . Sleep apnea 02/13/2016   Past Surgical History:  Procedure Laterality Date  . A-FLUTTER ABLATION     at [facility] 2018  . CARDIOVERSION  02/10/2018    Current Outpatient Medications  Medication Sig Dispense Refill  . acetaminophen (TYLENOL) 500 MG tablet Take by mouth as needed.     . benazepril (LOTENSIN) 20 MG tablet Take by mouth.    . doxepin (SINEQUAN) 25 MG capsule Take by mouth.    . doxycycline (VIBRAMYCIN) 100 MG capsule Take 100 mg by mouth daily.     Marland Kitchen HYDROcodone-acetaminophen (NORCO/VICODIN) 5-325 MG tablet Take 1 tablet by mouth 2 (two) times daily as needed for moderate pain. 60 tablet 0  . metoprolol tartrate (LOPRESSOR) 25 MG tablet Take 1 tablet (25 mg total) by mouth 2 (two) times daily. 180 tablet 3  . simvastatin (ZOCOR) 20 MG tablet Take by mouth.    . tamsulosin (FLOMAX) 0.4 MG CAPS capsule Take by mouth daily.    Marland Kitchen tiotropium (SPIRIVA) 18 MCG inhalation capsule Place 18 mcg into inhaler and inhale daily.    Marland Kitchen torsemide (DEMADEX) 20 MG tablet Take 2 tablets (40 mg total) by mouth 2 (two) times daily.    Marland Kitchen warfarin (COUMADIN) 10 MG tablet Take by mouth.     No current facility-administered medications for this encounter.     Allergies  Allergen Reactions  . Celebrex [Celecoxib]   . Methotrexate Derivatives Other (See Comments)    Arrhythmia   . Sulfa Antibiotics     Social History   Socioeconomic History  . Marital status: Divorced    Spouse name: Not  on file  . Number of children: Not on file  . Years of education: Not on file  . Highest education level: Not on file  Occupational History  . Not on file  Social Needs  . Financial resource strain: Not on file  . Food insecurity:    Worry: Not on file    Inability: Not on file  . Transportation needs:    Medical: Not on file    Non-medical: Not on file  Tobacco Use  . Smoking status: Current Every Day Smoker    Packs/day: 2.00    Years: 35.00    Pack years: 70.00     Types: Cigarettes  . Smokeless tobacco: Never Used  Substance and Sexual Activity  . Alcohol use: No  . Drug use: No  . Sexual activity: Not on file  Lifestyle  . Physical activity:    Days per week: Not on file    Minutes per session: Not on file  . Stress: Not on file  Relationships  . Social connections:    Talks on phone: Not on file    Gets together: Not on file    Attends religious service: Not on file    Active member of club or organization: Not on file    Attends meetings of clubs or organizations: Not on file    Relationship status: Not on file  . Intimate partner violence:    Fear of current or ex partner: Not on file    Emotionally abused: Not on file    Physically abused: Not on file    Forced sexual activity: Not on file  Other Topics Concern  . Not on file  Social History Narrative  . Not on file    Family History  Problem Relation Age of Onset  . Hypertension Father   . Heart attack Father   . Cancer Father        prostate  . Hypertension Sister   . Hypertension Sister   . Hypertension Sister   . Epilepsy Son   . Diabetes Son     ROS- All systems are reviewed and negative except as per the HPI above.  Physical Exam: Vitals:   05/11/18 1055  BP: (!) 110/50  Pulse: (!) 126  Weight: (!) 178.7 kg  Height: 6\' 1"  (1.854 m)    GEN- The patient is well appearing, obese male, alert and oriented x 3 today.   Head- normocephalic, atraumatic Eyes-  Sclera clear, conjunctiva pink Ears- hearing intact Oropharynx- clear Neck- supple  Lungs- Clear to ausculation bilaterally, normal work of breathing Heart- irregular rate and rhythm, tachycardia, no murmurs, rubs or gallops  GI- soft, NT, ND, + BS Extremities- no clubbing, cyanosis, or edema MS- no significant deformity or atrophy Skin- no rash or lesion Psych- euthymic mood, full affect Neuro- strength and sensation are intact  Wt Readings from Last 3 Encounters:  05/11/18 (!) 178.7 kg  05/05/18  (!) 177.6 kg  04/21/18 (!) 179.1 kg    EKG today demonstrates atrial fibrillation HR 126, NST, QRS 82, QTc 422  Epic records are reviewed at length today  Assessment and Plan:  1. Persistent atrial fibrillation S/p atrial flutter ablation at [facility] 2017. Persistent afib since 01/2018. Failed sotalol. Plan to admit for Tikosyn loading. Weekly INRs therapeutic. Continue warfarin. Patient denies benadryl use. He is aware of the cost of the medication and can afford it. Continue Lopressor 25 mg BID CrCl 179mL/min. K+ 4.0 Mag 2.3   This patients  CHA2DS2-VASc Score and unadjusted Ischemic Stroke Rate (% per year) is equal to 2.2 % stroke rate/year from a score of 2  Above score calculated as 1 point each if present [CHF, HTN, DM, Vascular=MI/PAD/Aortic Plaque, Age if 65-74, or Male] Above score calculated as 2 points each if present [Age > 75, or Stroke/TIA/TE]   2. Morbid obesity Body mass index is 51.98 kg/m. Encouraged lifestyle modifications.  3. OSA Compliant with CPAP therapy.  4. HTN Stable, no changes today  Admit for Tikosyn loading. Follow up with Afib clinic one week after discharge.  Oliver Barre, [place] 05/11/2018 11:28 AM

## 2018-05-11 NOTE — Progress Notes (Signed)
Pharmacy Consult for Trezevant Electrolyte Replacement  Pharmacy consulted to assist in monitoring and replacing electrolytes in this 68 y.o. male admitted on 05/11/2018 undergoing dofetilide initiation. First dofetilide dose: 05/11/2018  Labs:    Component Value Date/Time   K 4.0 05/11/2018 1050   MG 2.3 05/11/2018 1050     Plan: Potassium: K >/= 4: Appropriate to initiate Tikosyn  , no replacement needed.  Magnesium: Mg >2: Appropriate to initiate Tikosyn  , no replacement needed   Thank you for allowing pharmacy to participate in this patient's care   Wyatt Haste 05/11/2018  5:38 PM

## 2018-05-11 NOTE — Progress Notes (Signed)
ANTICOAGULATION CONSULT NOTE - Initial Consult  Pharmacy Consult for Coumadin Indication: atrial fibrillation  Allergies  Allergen Reactions  . Celebrex [Celecoxib]   . Methotrexate Derivatives Other (See Comments)    Arrhythmia   . Sulfa Antibiotics     Patient Measurements: Height: 6\' 1"  (185.4 cm) Weight: (!) 390 lb 1.6 oz (176.9 kg) IBW/kg (Calculated) : 79.9  Vital Signs: Temp: 98.1 F (36.7 C) (01/28 1650) Temp Source: Oral (01/28 1650) BP: 95/56 (01/28 1650) Pulse Rate: 80 (01/28 1650)  Labs: Recent Labs    05/11/18 0855 05/11/18 1050  INR 3.6*  --   CREATININE  --  0.98    Estimated Creatinine Clearance: 122.8 mL/min (by C-G formula based on SCr of 0.98 mg/dL).   Medical History: Past Medical History:  Diagnosis Date  . Anxiety 02/13/2016  . Atrial flutter ([place]) 02/13/2016   s/p CTI ablation at [facility]  . DDD (degenerative disc disease), lumbar 02/13/2016  . Elevated cholesterol 02/13/2016  . Hypertension 02/13/2016  . Insomnia 02/13/2016  . Osteoarthritis of both hands 02/13/2016  . Osteoarthritis of both knees 02/13/2016  . Persistent atrial fibrillation   . RA (rheumatoid arthritis) ([place]) 02/13/2016   Sero Negative  . 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 Sleep apnea 02/13/2016   Assessment:   68 yr old male admitted for Tikosyn loading.  To continue Coumadin for atrial fibrillation.   INR 3.6 today, supratherapeutic.  He reports last Coumadin dose taken yesterday.    PTA Coumadin regimen: 10 mg daily except 5 mg on Tuesdays and none on Saturdays.  Goal of Therapy:  INR 2-3 Monitor platelets by anticoagulation protocol: Yes   Plan:   No Coumadin tonight.  Daily PT/INR.  Arty Baumgartner, [place] Pager: [phone] 05/11/2018,6:54 PM

## 2018-05-11 NOTE — H&P (Signed)
Primary Care Physician: Patient, [name] Primary Cardiologist: Dr Harl Bowie Primary Electrophysiologist: Dr Rayann Heman Referring Physician: Dr Rayann Heman   Jesus Rodriguez is a 68 y.o. male with a history of persistent atrial fibrillation, OSA, atrial flutter s/p ablation 2017, HTN, HLD, and RA who presents for consultation in the [facility].  The patient is s/p atrial flutter ablation in 2017 at [facility]. Maintained on sotalol. He did well until October 2019 he he noted an irregular pulse. Underwent DCCV 02/10/18 with quick return to afib. He continues to have increased shortness of breath and edema.   Today, he denies symptoms of palpitations, chest pain, orthopnea, PND, dizziness, presyncope, syncope, snoring, daytime somnolence, bleeding, or neurologic sequela. The patient is tolerating medications without difficulties and is otherwise without complaint today.    Atrial Fibrillation Risk Factors:  he does have symptoms or diagnosis of sleep apnea. he is compliant with CPAP therapy.  he does not have a history of rheumatic fever.  he does not have a history of alcohol use.  he has a BMI of Body mass index is 51.47 kg/m.Marland Kitchen Filed Weights   05/11/18 1649  Weight: (!) 176.9 kg     Atrial Fibrillation Management history:  Previous antiarrhythmic drugs: sotalol  Previous cardioversions: 02/10/18  Previous ablations: Flutter ablation 2017 at [facility] score: 2  Anticoagulation history: Warfarin   Past Medical History:  Diagnosis Date  . Anxiety 02/13/2016  . Atrial flutter ([place]) 02/13/2016   s/p CTI ablation at [facility]  . DDD (degenerative disc disease), lumbar 02/13/2016  . Elevated cholesterol 02/13/2016  . Hypertension 02/13/2016  . Insomnia 02/13/2016  . Osteoarthritis of both hands 02/13/2016  . Osteoarthritis of both knees 02/13/2016  . Persistent atrial fibrillation   . RA (rheumatoid arthritis) ([place]) 02/13/2016   Sero Negative  . 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 Sleep apnea 02/13/2016   Past Surgical History:  Procedure Laterality Date  . A-FLUTTER ABLATION     at [facility] 2018  . CARDIOVERSION  02/10/2018    Current Facility-Administered Medications  Medication Dose Route Frequency Provider Last Rate Last Dose  . 0.9 %  sodium chloride infusion  250 mL Intravenous PRN Fenton, Clint R, PA      . benazepril (LOTENSIN) tablet 20 mg  20 mg Oral Daily Fenton, Clint R, PA      . dofetilide (TIKOSYN) capsule 500 mcg  500 mcg Oral BID Fenton, Clint R, PA      . doxepin (SINEQUAN) capsule 25 mg  25 mg Oral QHS Fenton, Clint R, PA      . doxycycline (VIBRAMYCIN) capsule 100 mg  100 mg Oral Daily Fenton, Clint R, PA      . metoprolol tartrate (LOPRESSOR) tablet 25 mg  25 mg Oral BID Fenton, Clint R, PA      . simvastatin (ZOCOR) tablet 20 mg  20 mg Oral q1800 Fenton, Clint R, PA      . sodium chloride flush (NS) 0.9 % injection 3 mL  3 mL Intravenous Q12H Fenton, Clint R, PA      . sodium chloride flush (NS) 0.9 % injection 3 mL  3 mL Intravenous PRN Fenton, Clint R, PA      . [START ON 05/12/2018] tamsulosin (FLOMAX) capsule 0.4 mg  0.4 mg Oral Daily Fenton, Clint R, PA      . tiotropium (SPIRIVA) inhalation capsule ([facility] use ONLY) 18 mcg  18 mcg Inhalation Daily Fenton, Clint R, PA      .  torsemide (DEMADEX) tablet 40 mg  40 mg Oral BID Fenton, Clint R, PA        Allergies  Allergen Reactions  . Celebrex [Celecoxib]   . Methotrexate Derivatives Other (See Comments)    Arrhythmia   . Sulfa Antibiotics     Social History   Socioeconomic History  . Marital status: Divorced    Spouse name: Not on file  . Number of children: Not on file  . Years of education: Not on file  . Highest education level: Not on file  Occupational History  . Not on file  Social Needs  . Financial resource strain: Not on file  . Food insecurity:    Worry: Not on file    Inability: Not on file  . Transportation needs:    Medical: Not on file      Non-medical: Not on file  Tobacco Use  . Smoking status: Current Every Day Smoker    Packs/day: 2.00    Years: 35.00    Pack years: 70.00    Types: Cigarettes  . Smokeless tobacco: Never Used  Substance and Sexual Activity  . Alcohol use: No  . Drug use: No  . Sexual activity: Not on file  Lifestyle  . Physical activity:    Days per week: Not on file    Minutes per session: Not on file  . Stress: Not on file  Relationships  . Social connections:    Talks on phone: Not on file    Gets together: Not on file    Attends religious service: Not on file    Active member of club or organization: Not on file    Attends meetings of clubs or organizations: Not on file    Relationship status: Not on file  . Intimate partner violence:    Fear of current or ex partner: Not on file    Emotionally abused: Not on file    Physically abused: Not on file    Forced sexual activity: Not on file  Other Topics Concern  . Not on file  Social History Narrative  . Not on file    Family History  Problem Relation Age of Onset  . Hypertension Father   . Heart attack Father   . Cancer Father        prostate  . Hypertension Sister   . Hypertension Sister   . Hypertension Sister   . Epilepsy Son   . Diabetes Son     ROS- All systems are reviewed and negative except as per the HPI above.  Physical Exam: Vitals:   05/11/18 1649 05/11/18 1650  BP:  (!) 95/56  Pulse:  80  Temp:  98.1 F (36.7 C)  TempSrc:  Oral  SpO2:  94%  Weight: (!) 176.9 kg   Height: 6\' 1"  (1.854 m)     GEN- The patient is well appearing, obese male, alert and oriented x 3 today.   Head- normocephalic, atraumatic Eyes-  Sclera clear, conjunctiva pink Ears- hearing intact Oropharynx- clear Neck- supple  Lungs- Clear to ausculation bilaterally, normal work of breathing Heart- irregular rate and rhythm, tachycardia, no murmurs, rubs or gallops  GI- soft, NT, ND, + BS Extremities- no clubbing, cyanosis, or  edema MS- no significant deformity or atrophy Skin- no rash or lesion Psych- euthymic mood, full affect Neuro- strength and sensation are intact  Wt Readings from Last 3 Encounters:  05/11/18 (!) 176.9 kg  05/11/18 (!) 178.7 kg  05/05/18 (!) 177.6 kg  EKG today demonstrates atrial fibrillation HR 126, NST, QRS 82, QTc 422  Epic records are reviewed at length today  Assessment and Plan:  1. Persistent atrial fibrillation S/p atrial flutter ablation at [facility] 2017. Persistent afib since 01/2018. Failed sotalol. Plan to admit for Tikosyn loading. Weekly INRs therapeutic. Continue warfarin. Patient denies benadryl use. He is aware of the cost of the medication and can afford it. Continue Lopressor 25 mg BID CrCl 195mL/min. K+ 4.0 Mag 2.3   This patients CHA2DS2-VASc Score and unadjusted Ischemic Stroke Rate (% per year) is equal to 2.2 % stroke rate/year from a score of 2  Above score calculated as 1 point each if present [CHF, HTN, DM, Vascular=MI/PAD/Aortic Plaque, Age if 65-74, or Male] Above score calculated as 2 points each if present [Age > 75, or Stroke/TIA/TE]   2. Morbid obesity Body mass index is 51.47 kg/m. Encouraged lifestyle modifications.  3. OSA Compliant with CPAP therapy.  4. HTN Stable, no changes today  Admit for Tikosyn loading. Follow up with Afib clinic one week after discharge.  Thompson Grayer, MD 05/11/2018 5:40 PM   I have seen, examined the patient, and reviewed the above assessment and plan.  Changes to above are made where necessary.  On exam, iRRR.  Will admit for initiation of tikosyn.  I would that without weight reduction, our efforts will not be successful long term.  Co Sign: Thompson Grayer, MD 05/11/2018 5:40 PM

## 2018-05-11 NOTE — Progress Notes (Addendum)
Pharmacy Review for Dofetilide (Tikosyn) Initiation  Admit Complaint: 68 y.o. male admitted 05/11/2018 with atrial fibrillation to be initiated on dofetilide.   Assessment:  Patient Exclusion Criteria: If any screening criteria checked as "Yes", then  patient  should NOT receive dofetilide until criteria item is corrected. If "Yes" please indicate correction plan.  YES  NO Patient  Exclusion Criteria Correction Plan  []  [x]  Baseline QTc interval is greater than or equal to 440 msec. IF above YES box checked dofetilide contraindicated unless patient has ICD; then may proceed if QTc 500-550 msec or with known ventricular conduction abnormalities may proceed with QTc 550-600 msec. QTc =  422   []  [x]  Magnesium level is less than 1.8 mEq/l : Last magnesium:  Lab Results  Component Value Date   MG 2.3 05/11/2018         []  [x]  Potassium level is less than 4 mEq/l : Last potassium:  Lab Results  Component Value Date   K 4.0 05/11/2018         []  [x]  Patient is known or suspected to have a digoxin level greater than 2 ng/ml: No results found for: DIGOXIN    []  [x]  Creatinine clearance less than 20 ml/min (calculated using Cockcroft-Gault, actual body weight and serum creatinine): Estimated Creatinine Clearance: 122.8 mL/min (by C-G formula based on SCr of 0.98 mg/dL).    []  [x]  Patient has received drugs known to prolong the QT intervals within the last 48 hours (phenothiazines, tricyclics or tetracyclic antidepressants, erythromycin, H-1 antihistamines, cisapride, fluoroquinolones, azithromycin). Drugs not listed above may have an, as yet, undetected potential to prolong the QT interval, updated information on QT prolonging agents is available at this website:QT prolonging agents Doxepin has moderate risk for QT prolongation, but baseline QTc within limits.  []  [x]  Patient received a dose of hydrochlorothiazide (Oretic) alone or in any combination including triamterene (Dyazide, Maxzide)  in the last 48 hours.   []  [x]  Patient received a medication known to increase dofetilide plasma concentrations prior to initial dofetilide dose:  . Trimethoprim (Primsol, Proloprim) in the last 36 hours . Verapamil (Calan, Verelan) in the last 36 hours or a sustained release dose in the last 72 hours . Megestrol (Megace) in the last 5 days  . Cimetidine (Tagamet) in the last 6 hours . Ketoconazole (Nizoral) in the last 24 hours . Itraconazole (Sporanox) in the last 48 hours  . Prochlorperazine (Compazine) in the last 36 hours    []  [x]  Patient is known to have a history of torsades de pointes; congenital or acquired long QT syndromes.   []  [x]  Patient has received a Class 1 antiarrhythmic with less than 2 half-lives since last dose. (Disopyramide, Quinidine, Procainamide, Lidocaine, Mexiletine, Flecainide, Propafenone)   []  [x]  Patient has received amiodarone therapy in the past 3 months or amiodarone level is greater than 0.3 ng/ml.    Patient has been appropriately anticoagulated with Coumadin.  Ordering provider was confirmed at [email or web address] if they are not listed on the [place] Prescribers list.  Goal of Therapy: Follow renal function, electrolytes, potential drug interactions, and dose adjustment. Provide education and 1 week supply at discharge.  Plan:  [x]   Physician selected initial dose within range recommended for patients level of renal function - will monitor for response.  []   Physician selected initial dose outside of range recommended for patients level of renal function - will discuss if the dose should be altered at this time.   [facility] Calculated CrCl  Dose q12h  [x]  > 60 ml/min 500 mcg  []  40-60 ml/min 250 mcg  []  20-40 ml/min 125 mcg   2. Follow up QTc after the first 5 doses, renal function, electrolytes (K & Mg) daily x 3 days, dose adjustment, success of initiation and facilitate 1 week discharge supply as clinically indicated.  3. Initiate  Tikosyn education video (Call [phone] and ask for video # 116).   Arty Baumgartner , [place] Pager: [phone]  6:51 PM 05/11/2018

## 2018-05-12 LAB — BASIC METABOLIC PANEL
Anion gap: 9 (ref 5–15)
BUN: 18 mg/dL (ref 8–23)
CO2: 29 mmol/L (ref 22–32)
Calcium: 8.6 mg/dL — ABNORMAL LOW (ref 8.9–10.3)
Chloride: 99 mmol/L (ref 98–111)
Creatinine, Ser: 0.98 mg/dL (ref 0.61–1.24)
GFR calc Af Amer: >60 mL/min (ref >60)
GFR calc non Af Amer: >60 mL/min (ref >60)
Glucose, Bld: 125 mg/dL — ABNORMAL HIGH (ref 70–99)
Potassium: 3.9 mmol/L (ref 3.5–5.1)
Sodium: 137 mmol/L (ref 135–145)

## 2018-05-12 LAB — PROTIME-INR
INR: 2.54
Prothrombin Time: 27 seconds — ABNORMAL HIGH (ref 11.4–15.2)

## 2018-05-12 LAB — MAGNESIUM: Magnesium: 2.1 mg/dL (ref 1.7–2.4)

## 2018-05-12 MED ORDER — SODIUM CHLORIDE 0.9% FLUSH: 3.0000 mL | INTRAVENOUS | Status: DC | PRN

## 2018-05-12 MED ORDER — SODIUM CHLORIDE 0.9% FLUSH
3.0000 mL | Freq: Two times a day (BID) | INTRAVENOUS | Status: DC
  Administered 2018-05-12 – 2018-05-13 (×2): 3 mL via INTRAVENOUS

## 2018-05-12 MED ORDER — POTASSIUM CHLORIDE CRYS ER 20 MEQ PO TBCR
40.0000 meq | EXTENDED_RELEASE_TABLET | Freq: Once | ORAL | Status: AC
  Administered 2018-05-12: 40 meq via ORAL
  Filled 2018-05-12: qty 2

## 2018-05-12 MED ORDER — SODIUM CHLORIDE 0.9 % IV SOLN: 250.0000 mL | INTRAVENOUS | Status: DC

## 2018-05-12 MED ORDER — WARFARIN SODIUM 10 MG PO TABS
10.0000 mg | ORAL_TABLET | Freq: Once | ORAL | Status: AC
  Administered 2018-05-12: 10 mg via ORAL
  Filled 2018-05-12: qty 1

## 2018-05-12 MED ORDER — HYDROCORTISONE 1 % EX CREA
1.0000 "application " | TOPICAL_CREAM | Freq: Three times a day (TID) | CUTANEOUS | Status: DC | PRN
  Filled 2018-05-12: qty 28

## 2018-05-12 NOTE — Progress Notes (Addendum)
Progress Note  Patient Name: Jesus Rodriguez Date of Encounter: 05/12/2018  Primary Cardiologist: Carlyle Dolly, MD  Electrophysiologist: Dr. Rayann Heman   Subjective   Slept well, no complaints  Inpatient Medications    Scheduled Meds: . benazepril  20 mg Oral Daily  . dofetilide  500 mcg Oral BID  . doxepin  25 mg Oral QHS  . doxycycline  100 mg Oral Daily  . metoprolol tartrate  25 mg Oral BID  . potassium chloride  40 mEq Oral Once  . simvastatin  20 mg Oral q1800  . sodium chloride flush  3 mL Intravenous Q12H  . tamsulosin  0.4 mg Oral Daily  . torsemide  40 mg Oral BID  . umeclidinium bromide  1 puff Inhalation Daily  . warfarin  10 mg Oral ONCE-1800  . Warfarin - Pharmacist Dosing Inpatient   Does not apply q1800   Continuous Infusions: . sodium chloride     PRN Meds: sodium chloride, HYDROcodone-acetaminophen, sodium chloride flush   Vital Signs    Vitals:   05/11/18 2002 05/12/18 0438 05/12/18 0854 05/12/18 0857  BP: 111/70 (!) 109/99  (!) 116/50  Pulse: (!) 107 (!) 109    Temp: (!) 97.5 F (36.4 C) 98.3 F (36.8 C)    TempSrc: Oral Oral    SpO2: 93% 93% 93%   Weight:  (!) 180.6 kg    Height:        Intake/Output Summary (Last 24 hours) at 05/12/2018 0952 Last data filed at 05/12/2018 0847 Gross per 24 hour  Intake 1080 ml  Output -  Net 1080 ml   Last 3 Weights 05/12/2018 05/11/2018 05/11/2018  Weight (lbs) 398 lb 3.2 oz 390 lb 1.6 oz 394 lb  Weight (kg) 180.622 kg 176.948 kg 178.717 kg      Telemetry    AF, 90's-100's - Personally Reviewed  ECG    AFib, 96bpm, QTc 435ms - Personally Reviewed  Physical Exam   GEN: No acute distress.   Neck: No JVD Cardiac: irreg-irreg, tachycardic, no murmurs, rubs, or gallops.  Respiratory: CTA b/l. GI: Soft, nontender, obese MS: brawny edema; chrionic looking skin changes. Neuro:  Nonfocal  Psych: Normal affect   Labs    Chemistry Recent Labs  Lab 05/05/18 1429 05/11/18 1050  05/12/18 0736  NA 135 138 137  K 4.1 4.0 3.9  CL 95* 95* 99  CO2 30 30 29   GLUCOSE 126* 193* 125*  BUN 19 20 18   CREATININE 0.96 0.98 0.98  CALCIUM 9.0 9.2 8.6*  GFRNONAA >60 >60 >60  GFRAA >60 >60 >60  ANIONGAP 10 13 9      HematologyNo results for input(s): WBC, RBC, HGB, HCT, MCV, MCH, MCHC, RDW, PLT in the last 168 hours.  Cardiac EnzymesNo results for input(s): TROPONINI in the last 168 hours. No results for input(s): TROPIPOC in the last 168 hours.   BNPNo results for input(s): BNP, PROBNP in the last 168 hours.   DDimer No results for input(s): DDIMER in the last 168 hours.   Radiology    No results found.  Cardiac Studies   01/11/18: TTE LVEF 70%  Patient Profile     68 y.o. male h/o AFlutter (ablated 2017), HTN, RA, OSA w/CPAP, and persistant AFIb (failed sotalol, stopped 04/21/2018), admitted for Tikosyn initation  Assessment & Plan    1. Persistent AFib     CHA2DS2Vasc is 2, on warfarin     K+ 3.9 (replacement ordered)     Mag 2.1  Creat 0.98 (stable)     INR 2.54 (c/w pharmacy management)     QTc is OK  DCCV tomorrow if not in SR  2. HTN     No changes today  3. Morbid obesity, will make maintenance of SR difficult     BMI 52.54  4. OSA w/CPAP     Reports compliance    For questions or updates, please contact [place] Please consult [email or web address] for contact info under        Signed, Baldwin [place], PA-C  05/12/2018, 9:52 AM     I have seen, examined the patient, and reviewed the above assessment and plan.  Changes to above are made where necessary.  On exam, iRRR.  Doing well with tikosyn but remains in AF.  QT is stable.  Plan for cardioversion tomorrow.  Co Sign: Thompson Grayer, MD 05/12/2018 11:31 PM

## 2018-05-12 NOTE — Progress Notes (Signed)
Placed patient on CPAP for the night with pressure set at [place].

## 2018-05-12 NOTE — Progress Notes (Signed)
Pharmacy Consult for [place] Electrolyte Replacement  Pharmacy consulted to assist in monitoring and replacing electrolytes in this 68 y.o. male admitted on 05/11/2018 undergoing dofetilide initiation. First dofetilide dose: 1/28 at 8pm  Labs:    Component Value Date/Time   K 3.9 05/12/2018 0736   MG 2.1 05/12/2018 0736     Plan: Potassium: K 3.8-3.9:  Give KCl 40 mEq po x1 - do not need to recheck K   Magnesium: Mg >2: No additional supplementation needed   Thank you for allowing pharmacy to participate in this patient's care   Hildred Laser, PharmD Clinical Pharmacist **Pharmacist phone directory can now be found on [email or web address] (PW [email or web address]).  Listed under [place].

## 2018-05-12 NOTE — H&P (View-Only) (Signed)
Progress Note  Patient Name: Jesus Rodriguez Date of Encounter: 05/12/2018  Primary Cardiologist: Carlyle Dolly, MD  Electrophysiologist: Dr. Rayann Heman   Subjective   Slept well, no complaints  Inpatient Medications    Scheduled Meds: . benazepril  20 mg Oral Daily  . dofetilide  500 mcg Oral BID  . doxepin  25 mg Oral QHS  . doxycycline  100 mg Oral Daily  . metoprolol tartrate  25 mg Oral BID  . potassium chloride  40 mEq Oral Once  . simvastatin  20 mg Oral q1800  . sodium chloride flush  3 mL Intravenous Q12H  . tamsulosin  0.4 mg Oral Daily  . torsemide  40 mg Oral BID  . umeclidinium bromide  1 puff Inhalation Daily  . warfarin  10 mg Oral ONCE-1800  . Warfarin - Pharmacist Dosing Inpatient   Does not apply q1800   Continuous Infusions: . sodium chloride     PRN Meds: sodium chloride, HYDROcodone-acetaminophen, sodium chloride flush   Vital Signs    Vitals:   05/11/18 2002 05/12/18 0438 05/12/18 0854 05/12/18 0857  BP: 111/70 (!) 109/99  (!) 116/50  Pulse: (!) 107 (!) 109    Temp: (!) 97.5 F (36.4 C) 98.3 F (36.8 C)    TempSrc: Oral Oral    SpO2: 93% 93% 93%   Weight:  (!) 180.6 kg    Height:        Intake/Output Summary (Last 24 hours) at 05/12/2018 0952 Last data filed at 05/12/2018 0847 Gross per 24 hour  Intake 1080 ml  Output -  Net 1080 ml   Last 3 Weights 05/12/2018 05/11/2018 05/11/2018  Weight (lbs) 398 lb 3.2 oz 390 lb 1.6 oz 394 lb  Weight (kg) 180.622 kg 176.948 kg 178.717 kg      Telemetry    AF, 90's-100's - Personally Reviewed  ECG    AFib, 96bpm, QTc 427ms - Personally Reviewed  Physical Exam   GEN: No acute distress.   Neck: No JVD Cardiac: irreg-irreg, tachycardic, no murmurs, rubs, or gallops.  Respiratory: CTA b/l. GI: Soft, nontender, obese MS: brawny edema; chrionic looking skin changes. Neuro:  Nonfocal  Psych: Normal affect   Labs    Chemistry Recent Labs  Lab 05/05/18 1429 05/11/18 1050  05/12/18 0736  NA 135 138 137  K 4.1 4.0 3.9  CL 95* 95* 99  CO2 30 30 29   GLUCOSE 126* 193* 125*  BUN 19 20 18   CREATININE 0.96 0.98 0.98  CALCIUM 9.0 9.2 8.6*  GFRNONAA >60 >60 >60  GFRAA >60 >60 >60  ANIONGAP 10 13 9      HematologyNo results for input(s): WBC, RBC, HGB, HCT, MCV, MCH, MCHC, RDW, PLT in the last 168 hours.  Cardiac EnzymesNo results for input(s): TROPONINI in the last 168 hours. No results for input(s): TROPIPOC in the last 168 hours.   BNPNo results for input(s): BNP, PROBNP in the last 168 hours.   DDimer No results for input(s): DDIMER in the last 168 hours.   Radiology    No results found.  Cardiac Studies   01/11/18: TTE LVEF 70%  Patient Profile     68 y.o. male h/o AFlutter (ablated 2017), HTN, RA, OSA w/CPAP, and persistant AFIb (failed sotalol, stopped 04/21/2018), admitted for Tikosyn initation  Assessment & Plan    1. Persistent AFib     CHA2DS2Vasc is 2, on warfarin     K+ 3.9 (replacement ordered)     Mag 2.1  Creat 0.98 (stable)     INR 2.54 (c/w pharmacy management)     QTc is OK  DCCV tomorrow if not in SR  2. HTN     No changes today  3. Morbid obesity, will make maintenance of SR difficult     BMI 52.54  4. OSA w/CPAP     Reports compliance    For questions or updates, please contact [place] Please consult [email or web address] for contact info under        Signed, Baldwin [place], PA-C  05/12/2018, 9:52 AM     I have seen, examined the patient, and reviewed the above assessment and plan.  Changes to above are made where necessary.  On exam, iRRR.  Doing well with tikosyn but remains in AF.  QT is stable.  Plan for cardioversion tomorrow.  Co Sign: Thompson Grayer, MD 05/12/2018 11:31 PM

## 2018-05-12 NOTE — Progress Notes (Signed)
[place] for Coumadin Indication: atrial fibrillation  Allergies  Allergen Reactions  . Celebrex [Celecoxib]   . Methotrexate Derivatives Other (See Comments)    Arrhythmia   . Sulfa Antibiotics     Patient Measurements: Height: 6\' 1"  (185.4 cm) Weight: (!) 398 lb 3.2 oz (180.6 kg) IBW/kg (Calculated) : 79.9  Vital Signs: Temp: 98.3 F (36.8 C) (01/29 0438) Temp Source: Oral (01/29 0438) BP: 109/99 (01/29 0438) Pulse Rate: 109 (01/29 0438)  Labs: Recent Labs    05/11/18 0855 05/11/18 1050 05/11/18 1909 05/12/18 0736  LABPROT  --   --  30.3* 27.0*  INR 3.6*  --  2.94 2.54  CREATININE  --  0.98  --   --     Estimated Creatinine Clearance: 124.4 mL/min (by C-G formula based on SCr of 0.98 mg/dL).   Medical History: Past Medical History:  Diagnosis Date  . Anxiety 02/13/2016  . Atrial flutter ([place]) 02/13/2016   s/p CTI ablation at [facility]  . DDD (degenerative disc disease), lumbar 02/13/2016  . Elevated cholesterol 02/13/2016  . Hypertension 02/13/2016  . Insomnia 02/13/2016  . Osteoarthritis of both hands 02/13/2016  . Osteoarthritis of both knees 02/13/2016  . Persistent atrial fibrillation   . RA (rheumatoid arthritis) ([place]) 02/13/2016   Sero Negative  . Scrotal cancer ([place]) 02/13/2016  . Sleep apnea 02/13/2016   Assessment:   68 yr old male admitted for Tikosyn loading.  To continue Coumadin for atrial fibrillation.  INR 3.6 at admission.  Last Coumadin dose taken 1/27  PTA Coumadin regimen: 10 mg daily except 5 mg on Tuesdays and none on Saturdays.  Goal of Therapy:  INR 2-3 Monitor platelets by anticoagulation protocol: Yes   Plan:  Coumadin 10mg  po today Daily PT/INR.  Hildred Laser, PharmD Clinical Pharmacist **Pharmacist phone directory can now be found on [email or web address] ([email or web address]).  Listed under [place].

## 2018-05-12 NOTE — Care Management (Signed)
#  2.   S/W  RENEE  @ [employer] # [phone]  1. TIKOSYN  500 MCG  BID COVER- YES CO-PAY- $ 30.00 TIER- 4 DRUG PRIOR APPROVAL- NO 90 DAY SUPPLY FOR M/O $ 75.00  2. DOFETILIDE   500 MCG BID COVER- YES  CO-PAY- $ 12.50 TIER- NO PRIOR APPROVAL- NO 90 DAY SUPPLY FOR M/O $ 31.25  DEDUCTIBLE : NOT MET  PREFERRED PHARMACY : YES [facility] AND [facility]

## 2018-05-13 ENCOUNTER — Encounter (HOSPITAL_COMMUNITY): Payer: Self-pay | Admitting: Certified Registered"

## 2018-05-13 ENCOUNTER — Inpatient Hospital Stay (HOSPITAL_COMMUNITY): Payer: Medicare Other | Admitting: Certified Registered"

## 2018-05-13 ENCOUNTER — Encounter (HOSPITAL_COMMUNITY)
Admission: AD | Disposition: A | Discharge status: Home / Self Care | Payer: Self-pay | Source: Home / Self Care | Attending: Internal Medicine

## 2018-05-13 DIAGNOSIS — I4891 Unspecified atrial fibrillation: Secondary | ICD-10-CM

## 2018-05-13 DIAGNOSIS — I10 Essential (primary) hypertension: Secondary | ICD-10-CM

## 2018-05-13 HISTORY — PX: CARDIOVERSION: SHX1299

## 2018-05-13 LAB — BASIC METABOLIC PANEL
Anion gap: 10 (ref 5–15)
BUN: 19 mg/dL (ref 8–23)
CO2: 30 mmol/L (ref 22–32)
Calcium: 8.6 mg/dL — ABNORMAL LOW (ref 8.9–10.3)
Chloride: 99 mmol/L (ref 98–111)
Creatinine, Ser: 0.95 mg/dL (ref 0.61–1.24)
GFR calc Af Amer: >60 mL/min (ref >60)
GFR calc non Af Amer: >60 mL/min (ref >60)
Glucose, Bld: 114 mg/dL — ABNORMAL HIGH (ref 70–99)
Potassium: 4.1 mmol/L (ref 3.5–5.1)
Sodium: 139 mmol/L (ref 135–145)

## 2018-05-13 LAB — MAGNESIUM: Magnesium: 2.2 mg/dL (ref 1.7–2.4)

## 2018-05-13 LAB — PROTIME-INR
INR: 2.2
Prothrombin Time: 24.2 seconds — ABNORMAL HIGH (ref 11.4–15.2)

## 2018-05-13 SURGERY — CARDIOVERSION
Anesthesia: General

## 2018-05-13 SURGERY — ECHOCARDIOGRAM, TRANSESOPHAGEAL
Anesthesia: Monitor Anesthesia Care

## 2018-05-13 MED ORDER — PROPOFOL 10 MG/ML IV BOLUS
INTRAVENOUS | Status: DC | PRN
  Administered 2018-05-13: 100 mg via INTRAVENOUS

## 2018-05-13 MED ORDER — LIDOCAINE 2% (20 MG/ML) 5 ML SYRINGE
INTRAMUSCULAR | Status: DC | PRN
  Administered 2018-05-13: 60 mg via INTRAVENOUS

## 2018-05-13 NOTE — Progress Notes (Signed)
Pharmacy Consult for [place] Electrolyte Replacement  Pharmacy consulted to assist in monitoring and replacing electrolytes in this 68 y.o. male admitted on 05/11/2018 undergoing dofetilide initiation. First dofetilide dose: 1/28 at 8pm  Labs:    Component Value Date/Time   K 4.1 05/13/2018 0401   MG 2.2 05/13/2018 0401     Plan: Potassium: K >/= 4: No additional supplementation needed  Magnesium: Mg >2: No additional supplementation needed   Thank you for allowing pharmacy to participate in this patient's care   Hildred Laser, PharmD Clinical Pharmacist **Pharmacist phone directory can now be found on [email or web address] ([email or web address]).  Listed under [place].

## 2018-05-13 NOTE — Anesthesia Postprocedure Evaluation (Signed)
Anesthesia Post Note  Patient: Jamus Loving  Procedure(s) Performed: CARDIOVERSION (N/A )     Patient location during evaluation: PACU Anesthesia Type: General Level of consciousness: awake and alert Pain management: pain level controlled Vital Signs Assessment: post-procedure vital signs reviewed and stable Respiratory status: spontaneous breathing, nonlabored ventilation, respiratory function stable and patient connected to nasal cannula oxygen Cardiovascular status: blood pressure returned to baseline and stable Postop Assessment: no apparent nausea or vomiting Anesthetic complications: no    Last Vitals:  Vitals:   05/13/18 1417 05/13/18 1447  BP: (!) 91/59 (!) 106/54  Pulse:    Resp:    Temp:    SpO2:      Last Pain:  Vitals:   05/13/18 1350  TempSrc:   PainSc: 0-No pain                 Beanca Kiester P Leondra Cullin

## 2018-05-13 NOTE — Progress Notes (Signed)
Placed patient on CPAP for the night with pressure set at 15cm.

## 2018-05-13 NOTE — Transfer of Care (Signed)
Immediate Anesthesia Transfer of Care Note  Patient: Benard Minturn  Procedure(s) Performed: CARDIOVERSION (N/A )  Patient Location: Endoscopy Unit  Anesthesia Type:General  Level of Consciousness: awake and patient cooperative  Airway & Oxygen Therapy: Patient Spontanous Breathing and Patient connected to nasal cannula oxygen  Post-op Assessment: Report given to RN, Post -op Vital signs reviewed and stable and Patient moving all extremities  Post vital signs: Reviewed and stable  Last Vitals:  Vitals Value Taken Time  BP    Temp    Pulse    Resp    SpO2      Last Pain:  Vitals:   05/13/18 1220  TempSrc: Oral  PainSc: 0-No pain      Patients Stated Pain Goal: 0 (29/57/47 3403)  Complications: No apparent anesthesia complications

## 2018-05-13 NOTE — Interval H&P Note (Signed)
History and Physical Interval Note:  05/13/2018 12:31 PM  Jesus Rodriguez  has presented today for surgery, with the diagnosis of a fib  The various methods of treatment have been discussed with the patient and family. After consideration of risks, benefits and other options for treatment, the patient has consented to  Procedure(s): CARDIOVERSION (N/A) as a surgical intervention .  The patient's history has been reviewed, patient examined, no change in status, stable for surgery.  I have reviewed the patient's chart and labs.  Questions were answered to the patient's satisfaction.     Rettie Laird

## 2018-05-13 NOTE — Progress Notes (Addendum)
Progress Note  Patient Name: Jesus Rodriguez Date of Encounter: 05/13/2018  Primary Cardiologist: Carlyle Dolly, MD  Electrophysiologist: Dr. Rayann Heman   Subjective   Slept well, no complaints  Inpatient Medications    Scheduled Meds: . benazepril  20 mg Oral Daily  . dofetilide  500 mcg Oral BID  . doxepin  25 mg Oral QHS  . doxycycline  100 mg Oral Daily  . metoprolol tartrate  25 mg Oral BID  . simvastatin  20 mg Oral q1800  . sodium chloride flush  3 mL Intravenous Q12H  . sodium chloride flush  3 mL Intravenous Q12H  . tamsulosin  0.4 mg Oral Daily  . torsemide  40 mg Oral BID  . umeclidinium bromide  1 puff Inhalation Daily  . Warfarin - Pharmacist Dosing Inpatient   Does not apply q1800   Continuous Infusions: . sodium chloride    . sodium chloride     PRN Meds: sodium chloride, HYDROcodone-acetaminophen, hydrocortisone cream, sodium chloride flush, sodium chloride flush   Vital Signs    Vitals:   05/12/18 2202 05/13/18 0604 05/13/18 0851 05/13/18 0923  BP:  117/73 114/78   Pulse: (!) 101 (!) 106    Resp: 19 20    Temp:  97.8 F (36.6 C)    TempSrc:  Oral    SpO2: 96% 93%  91%  Weight:  (!) 174.8 kg    Height:        Intake/Output Summary (Last 24 hours) at 05/13/2018 1201 Last data filed at 05/13/2018 0830 Gross per 24 hour  Intake 840 ml  Output -  Net 840 ml   Last 3 Weights 05/13/2018 05/12/2018 05/11/2018  Weight (lbs) 385 lb 6.4 oz 398 lb 3.2 oz 390 lb 1.6 oz  Weight (kg) 174.816 kg 180.622 kg 176.948 kg      Telemetry    AF, 90's-100's - Personally Reviewed  ECG    AFib, 90bpm, QTc 460ms - Personally Reviewed  Physical Exam   Exam is unchanged GEN: No acute distress.   Neck: No JVD Cardiac: irreg-irreg, tachycardic, no murmurs, rubs, or gallops.  Respiratory: CTA b/l. GI: Soft, nontender, obese MS: brawny edema; chrionic looking skin changes. Neuro:  Nonfocal  Psych: Normal affect   Labs    Chemistry Recent Labs  Lab  05/11/18 1050 05/12/18 0736 05/13/18 0401  NA 138 137 139  K 4.0 3.9 4.1  CL 95* 99 99  CO2 30 29 30   GLUCOSE 193* 125* 114*  BUN 20 18 19   CREATININE 0.98 0.98 0.95  CALCIUM 9.2 8.6* 8.6*  GFRNONAA >60 >60 >60  GFRAA >60 >60 >60  ANIONGAP 13 9 10      HematologyNo results for input(s): WBC, RBC, HGB, HCT, MCV, MCH, MCHC, RDW, PLT in the last 168 hours.  Cardiac EnzymesNo results for input(s): TROPONINI in the last 168 hours. No results for input(s): TROPIPOC in the last 168 hours.   BNPNo results for input(s): BNP, PROBNP in the last 168 hours.   DDimer No results for input(s): DDIMER in the last 168 hours.   Radiology    No results found.  Cardiac Studies   01/11/18: TTE LVEF 70%  Patient Profile     68 y.o. male h/o AFlutter (ablated 2017), HTN, RA, OSA w/CPAP, and persistant AFIb (failed sotalol, stopped 04/21/2018), admitted for Tikosyn initation  Assessment & Plan    1. Persistent AFib     CHA2DS2Vasc is 2, on warfarin     K+ 4.1  Mag 2.2     Creat 0.95 (stable)     INR 2.20 (c/w pharmacy management)     QTc is OK  DCCV today  2. HTN     No changes today  3. Morbid obesity, will make maintenance of SR difficult     BMI 52.54  4. OSA w/CPAP     Reports compliance    For questions or updates, please contact [place] Please consult [email or web address] for contact info under        Signed, Baldwin Jamaica, PA-C  05/13/2018, 12:01 PM     I have seen, examined the patient, and reviewed the above assessment and plan.  Changes to above are made where necessary.  On exam, RRR.  Hopefully home tomorrow if QT remains stable.  BP looks OK.  Using CPAP.  Ultimately, he will need to lose substantial weight for our efforts to be effective.  Bariatric surgery should be considered.  Co Sign: Thompson Grayer, MD 05/13/2018 3:47 PM

## 2018-05-13 NOTE — Op Note (Signed)
Procedure: Electrical Cardioversion Indications:  Atrial Fibrillation  Procedure Details:  Consent: Risks of procedure as well as the alternatives and risks of each were explained to the (patient/caregiver).  Consent for procedure obtained.  Time Out: Verified patient identification, verified procedure, site/side was marked, verified correct patient position, special equipment/implants available, medications/allergies/relevent history reviewed, required imaging and test results available.  Performed  Patient placed on cardiac monitor, pulse oximetry, supplemental oxygen as necessary.  Sedation given: Propofol IV, Dr. Roanna Banning Pacer pads placed anterior and posterior chest.  Cardioverted 1 time(s).  Cardioversion with synchronized biphasic 200J shock.  Evaluation: Findings: Post procedure EKG shows: NSR Complications: None Patient did tolerate procedure well.  Time Spent Directly with the Patient:  30 minutes   Jesus Rodriguez 05/13/2018, 1:20 PM

## 2018-05-13 NOTE — Care Management Note (Signed)
Case Management Note  Patient Details  Name: Jesus Rodriguez MRN: [id number] Date of Birth: [birth year]  Subjective/Objective: Pt presented for Tikosyn Load- Persistent Atrial Fib. PTA from home-no needs identified.                   Action/Plan: Pt will need Rx for 7 day supply no refills and original Rx with refills needs to [facility]scribed to [facility]. No further needs from CM at this time.   Expected Discharge Date:                  Expected Discharge Plan:  Home/Self Care  In-House Referral:  NA  Discharge planning Services  CM Consult  Post Acute Care Choice:  NA Choice offered to:  NA  DME Arranged:  N/A DME Agency:  NA  HH Arranged:  NA HH Agency:  NA  Status of Service:  Completed, signed off  If discussed at [place] of Stay Meetings, dates discussed:    Additional Comments:  Bethena Roys, RN 05/13/2018, 10:38 AM

## 2018-05-13 NOTE — Anesthesia Preprocedure Evaluation (Addendum)
Anesthesia Evaluation  Patient identified by MRN, date of birth, ID band Patient awake    Reviewed: Allergy & Precautions, NPO status , Patient's Chart, lab work & pertinent test results  Airway Mallampati: III  TM Distance: >3 FB Neck ROM: Full    Dental no notable dental hx.    Pulmonary sleep apnea and Continuous Positive Airway Pressure Ventilation , Current Smoker,    Pulmonary exam normal breath sounds clear to auscultation       Cardiovascular hypertension, Pt. on medications and Pt. on home beta blockers Normal cardiovascular exam+ dysrhythmias Atrial Fibrillation  Rhythm:Irregular Rate:Normal  ECG: A-fib, rate 91   Neuro/Psych PSYCHIATRIC DISORDERS Anxiety Depression negative neurological ROS     GI/Hepatic negative GI ROS, Neg liver ROS,   Endo/Other  Morbid obesity (Super)  Renal/GU negative Renal ROS     Musculoskeletal  (+) Arthritis , Rheumatoid disorders,    Abdominal (+) + obese,   Peds  Hematology HLD   Anesthesia Other Findings a fib  Reproductive/Obstetrics                            Anesthesia Physical Anesthesia Plan  ASA: IV  Anesthesia Plan: General   Post-op Pain Management:    Induction: Intravenous  PONV Risk Score and Plan: 1 and Propofol infusion and Treatment may vary due to age or medical condition  Airway Management Planned: Mask  Additional Equipment:   Intra-op Plan:   Post-operative Plan:   Informed Consent: I have reviewed the patients History and Physical, chart, labs and discussed the procedure including the risks, benefits and alternatives for the proposed anesthesia with the patient or authorized representative who has indicated his/her understanding and acceptance.     Dental advisory given  Plan Discussed with: CRNA  Anesthesia Plan Comments:        Anesthesia Quick Evaluation

## 2018-05-14 ENCOUNTER — Encounter (HOSPITAL_COMMUNITY): Payer: Self-pay | Admitting: Cardiovascular Disease

## 2018-05-14 ENCOUNTER — Telehealth: Payer: Self-pay | Admitting: Pharmacist

## 2018-05-14 LAB — PROTIME-INR
INR: 1.87
Prothrombin Time: 21.3 seconds — ABNORMAL HIGH (ref 11.4–15.2)

## 2018-05-14 LAB — BASIC METABOLIC PANEL
Anion gap: 9 (ref 5–15)
BUN: 19 mg/dL (ref 8–23)
CO2: 30 mmol/L (ref 22–32)
Calcium: 8.9 mg/dL (ref 8.9–10.3)
Chloride: 102 mmol/L (ref 98–111)
Creatinine, Ser: 0.99 mg/dL (ref 0.61–1.24)
GFR calc Af Amer: >60 mL/min (ref >60)
GFR calc non Af Amer: >60 mL/min (ref >60)
Glucose, Bld: 111 mg/dL — ABNORMAL HIGH (ref 70–99)
Potassium: 4.3 mmol/L (ref 3.5–5.1)
Sodium: 141 mmol/L (ref 135–145)

## 2018-05-14 LAB — MAGNESIUM: Magnesium: 2.4 mg/dL (ref 1.7–2.4)

## 2018-05-14 MED ORDER — INFLUENZA VAC SPLIT HIGH-DOSE 0.5 ML IM SUSY: 0.5000 mL | PREFILLED_SYRINGE | INTRAMUSCULAR | Status: DC

## 2018-05-14 MED ORDER — DOFETILIDE 500 MCG PO CAPS: 500.0000 ug | ORAL_CAPSULE | Freq: Two times a day (BID) | ORAL | 6 refills | Status: DC

## 2018-05-14 MED ORDER — DOFETILIDE 500 MCG PO CAPS: 500.0000 ug | ORAL_CAPSULE | Freq: Two times a day (BID) | ORAL | 0 refills | Status: DC

## 2018-05-14 MED ORDER — INFLUENZA VAC SPLIT HIGH-DOSE 0.5 ML IM SUSY
0.5000 mL | PREFILLED_SYRINGE | Freq: Once | INTRAMUSCULAR | Status: AC
  Administered 2018-05-14: 0.5 mL via INTRAMUSCULAR
  Filled 2018-05-14: qty 0.5

## 2018-05-14 MED ORDER — WARFARIN SODIUM 10 MG PO TABS
10.0000 mg | ORAL_TABLET | Freq: Once | ORAL | Status: AC
  Administered 2018-05-14: 10 mg via ORAL
  Filled 2018-05-14: qty 1

## 2018-05-14 MED FILL — TIKOSYN 500 MCG CAPS: 500 | 7 days supply | Qty: 14 | Fill #0

## 2018-05-14 NOTE — Care Management Important Message (Signed)
Important Message  Patient Details  Name: Jesus Rodriguez MRN: [id number] Date of Birth: [birth year]   Medicare Important Message Given:  Yes    Jalaina Salyers P Aysia Lowder 05/14/2018, 3:05 PM

## 2018-05-14 NOTE — Discharge Summary (Addendum)
ELECTROPHYSIOLOGY PROCEDURE DISCHARGE SUMMARY    Patient ID: Jesus Rodriguez,  MRN: [id number], DOB/AGE: March 18, 1951 68 y.o.  Admit date: 05/11/2018 Discharge date: 05/14/2018  Primary Care Physician: Patient, [name]  Primary Cardiologist: Dr. Harl Bowie Electrophysiologist: Dr. Rayann Heman  Primary Discharge Diagnosis:  1.  Persistent atrial fibrillation status post Tikosyn loading this admission      CHA2DS2Vasc is 2, on Warfarin, monitored and managed with the coumadin clinic  Secondary Discharge Diagnosis:  1. Morbid obesity 2. OSA w/CPAP 3. HTN  Allergies  Allergen Reactions  . Celebrex [Celecoxib]   . Methotrexate Derivatives Other (See Comments)    Arrhythmia   . Sulfa Antibiotics      Procedures This Admission:  1.  Tikosyn loading 2.  Direct current cardioversion on 05/13/2018 by Dr Sallyanne Kuster which successfully restored SR.  There were no early apparent complications.   Brief HPI: Jesus Rodriguez is a 68 y.o. male with a past medical history as noted above.  He is followed by EP in the outpatient setting with persistent AFib, recommendedTikosyn.  Risks, benefits, and alternatives to Tikosyn were reviewed with the patient who wished to proceed.    Hospital Course:  The patient was admitted and Tikosyn was initiated.  Renal function and electrolytes were followed during the hospitalization.  His QTc remained stable.  On 05/13/2018 the patient underwent direct current cardioversion which restored sinus rhythm.  He was monitored until discharge on telemetry which demonstrated AFib > to SR.  On the day of discharge, the patient feels well, he can tell the difference in SR, feels better.  He was examined by Dr Rayann Heman who considered him stable for discharge to home.  Follow-up has been arranged with the Afib clinic in 1 week and with Dr Rayann Heman in 4 weeks.   Physical Exam: Vitals:   05/13/18 2258 05/14/18 0535 05/14/18 0804 05/14/18 0903  BP:  122/63 113/63   Pulse: 81 74      Resp: 19     Temp:  97.9 F (36.6 C)    TempSrc:  Oral    SpO2: 96% 95%  96%  Weight:  (!) 175.3 kg    Height:        GEN- The patient is well appearing, alert and oriented x 3 today.   HEENT: normocephalic, atraumatic; sclera clear, conjunctiva pink; hearing intact; oropharynx clear; neck supple, no JVP Lymph- no cervical lymphadenopathy Lungs- CTA b/l b/l, normal work of breathing.  No wheezes, rales, rhonchi Heart- RRR, no murmurs, rubs or gallops, PMI not laterally displaced GI- soft, non-tender, non-distended, obese Extremities- no clubbing, cyanosis, brawny edema; chronic looking skin chanes MS- no significant deformity or atrophy Skin- warm and dry, no rash or lesion Psych- euthymic mood, full affect Neuro- strength and sensation are intact   Labs:  No results found for: WBC, HGB, HCT, MCV, PLT  Recent Labs  Lab 05/14/18 0438  NA 141  K 4.3  CL 102  CO2 30  BUN 19  CREATININE 0.99  CALCIUM 8.9  GLUCOSE 111*     Discharge Medications:  Allergies as of 05/14/2018      Reactions   Celebrex [celecoxib]    Methotrexate Derivatives Other (See Comments)   Arrhythmia    Sulfa Antibiotics       Medication List    TAKE these medications   acetaminophen 500 MG tablet Commonly known as:  TYLENOL Take by mouth as needed.   benazepril 20 MG tablet Commonly known as:  LOTENSIN  Take by mouth.   dofetilide 500 MCG capsule Commonly known as:  TIKOSYN Take 1 capsule (500 mcg total) by mouth 2 (two) times daily.   doxepin 25 MG capsule Commonly known as:  SINEQUAN Take by mouth.   doxycycline 100 MG capsule Commonly known as:  VIBRAMYCIN Take 100 mg by mouth daily.   HYDROcodone-acetaminophen 5-325 MG tablet Commonly known as:  NORCO/VICODIN Take 1 tablet by mouth 2 (two) times daily as needed for moderate pain.   metoprolol tartrate 25 MG tablet Commonly known as:  LOPRESSOR Take 1 tablet (25 mg total) by mouth 2 (two) times daily.   simvastatin 20  MG tablet Commonly known as:  ZOCOR Take by mouth.   tamsulosin 0.4 MG Caps capsule Commonly known as:  FLOMAX Take by mouth daily.   tiotropium 18 MCG inhalation capsule Commonly known as:  SPIRIVA Place 18 mcg into inhaler and inhale daily.   torsemide 20 MG tablet Commonly known as:  DEMADEX Take 2 tablets (40 mg total) by mouth 2 (two) times daily.   warfarin 10 MG tablet Commonly known as:  COUMADIN Take as directed. If you are unsure how to take this medication, talk to your nurse or doctor. Original instructions:  Take 0-10 mg by mouth daily at 6 PM. 10 mg daily except 5 mg on Tuesday and none on Saturdays       Disposition:  Home Discharge Instructions    Diet - low sodium heart healthy   Complete by:  As directed    Increase activity slowly   Complete by:  As directed      Follow-up Information    [facility] Follow up.   Specialty:  Cardiology Why:  05/21/2018 @ 9:00AM Contact information: [address] [id number] [place] [phone]       Thompson Grayer, MD Follow up.   Specialty:  Cardiology Why:  06/18/2018 @ 11:15AM Contact information: [place]  [id number] [place] Follow up.   Specialty:  Cardiology Why:  05/20/2018 @ 1:30PM, coumadin clinic Contact information: [place] [phone]          Duration of Discharge Encounter: Greater than 30 minutes including physician time.  Signed, Tommye Standard, PA-C 05/14/2018 1:17 PM   I have seen, examined the patient, and reviewed the above assessment and plan.  Changes to above are made where necessary.  On exam, RRR.  Qt is stable with tikosyn.  Will follow closely in the AF clinic.  Given his size, I worry about early recurrence.  He is not an ablation candidate.  Ultimately, he would do best with bariatric  surgery.  Co Sign: Thompson Grayer, MD 05/14/2018 3:07 PM

## 2018-05-14 NOTE — Progress Notes (Signed)
Pharmacy Consult for [place] Electrolyte Replacement  Pharmacy consulted to assist in monitoring and replacing electrolytes in this 68 y.o. male admitted on 05/11/2018 undergoing dofetilide initiation. First dofetilide dose: 1/28 at 8pm  Labs:    Component Value Date/Time   K 4.3 05/14/2018 0438   MG 2.4 05/14/2018 0438     Plan: Potassium: K >/= 4: No additional supplementation needed  Magnesium: Mg >2: No additional supplementation needed   Patient received potassium 51mEq x1, while continuing home torsemide 40 mg twice daily. Patient K has remained mostly above goal without supplementation. Do not recommend potassium supplementation on discharge if he continues on torsemide 40 mg twice daily.  Thank you for allowing pharmacy to participate in this patient's care   Claiborne Billings, PharmD PGY2 Cardiology Pharmacy Resident Please check AMION for all Pharmacist numbers by unit 05/14/2018 7:27 AM

## 2018-05-14 NOTE — Telephone Encounter (Signed)
Called patient and let him know that we would like him to have his INR checked earlier due to his low INR today. Appointment moved to 2/6 @ 1:30. Pt is ok with this

## 2018-05-14 NOTE — Progress Notes (Signed)
[place] for Coumadin Indication: atrial fibrillation  Allergies  Allergen Reactions  . Celebrex [Celecoxib]   . Methotrexate Derivatives Other (See Comments)    Arrhythmia   . Sulfa Antibiotics     Patient Measurements: Height: 6\' 1"  (185.4 cm) Weight: (!) 386 lb 6.4 oz (175.3 kg) IBW/kg (Calculated) : 79.9  Vital Signs: Temp: 97.9 F (36.6 C) (01/31 0535) Temp Source: Oral (01/31 0535) BP: 122/63 (01/31 0535) Pulse Rate: 74 (01/31 0535)  Labs: Recent Labs    05/12/18 0736 05/13/18 0401 05/14/18 0438  LABPROT 27.0* 24.2* 21.3*  INR 2.54 2.20 1.87  CREATININE 0.98 0.95 0.99    Estimated Creatinine Clearance: 120.9 mL/min (by C-G formula based on SCr of 0.99 mg/dL).   Medical History: Past Medical History:  Diagnosis Date  . Anxiety 02/13/2016  . Atrial flutter ([place]) 02/13/2016   s/p CTI ablation at [facility]  . DDD (degenerative disc disease), lumbar 02/13/2016  . Elevated cholesterol 02/13/2016  . Hypertension 02/13/2016  . Insomnia 02/13/2016  . Osteoarthritis of both hands 02/13/2016  . Osteoarthritis of both knees 02/13/2016  . Persistent atrial fibrillation   . RA (rheumatoid arthritis) ([place]) 02/13/2016   Sero Negative  . 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 Sleep apnea 02/13/2016   Assessment:   68 yr old male admitted for Tikosyn loading.  To continue Coumadin for atrial fibrillation.  INR 3.6 at admission.  Last Coumadin dose taken 1/27  PTA Coumadin regimen: 10 mg daily except 5 mg on Tuesdays and none on Saturdays. Tikosyn dose missed last evening, 10 mg given this morning. INR subtherapeutic this AM at 1.87, likely due to delayed effects of skipped dose 1/28 given INR 3.6 in the early AM and missed dose yesterday.   Goal of Therapy:  INR 2-3 Monitor platelets by anticoagulation protocol: Yes   Plan:  Coumadin 10mg  po given this morning. Given subtherapeutic INR, would give 5 mg tonight, and 5 mg tomorrow night (usually  skips dose Saturday).   Follow up in Coumadin clinic next week given labile INR. Reached out to Coumadin clinic to reschedule.    Daily PT/INR.   Claiborne Billings, PharmD PGY2 Cardiology Pharmacy Resident Please check AMION for all Pharmacist numbers by unit 05/14/2018 8:04 AM

## 2018-05-14 NOTE — Discharge Instructions (Signed)

## 2018-05-20 ENCOUNTER — Telehealth: Payer: Self-pay | Admitting: Rheumatology

## 2018-05-20 MED ORDER — HYDROCODONE-ACETAMINOPHEN 5-325 MG PO TABS: 1.0000 | ORAL_TABLET | Freq: Two times a day (BID) | ORAL | 0 refills | Status: DC | PRN

## 2018-05-20 NOTE — Telephone Encounter (Signed)
Last Visit: 03/16/18 Next visit: 09/16/18 UDS: 02/17/18 Narc Agreement: 02/17/18  Okay to refill Hydrocodone?

## 2018-05-20 NOTE — Telephone Encounter (Signed)
[facility] [place]

## 2018-05-21 ENCOUNTER — Ambulatory Visit (HOSPITAL_COMMUNITY): Payer: Medicare Other | Admitting: Nurse Practitioner

## 2018-05-24 ENCOUNTER — Ambulatory Visit (HOSPITAL_COMMUNITY)
Admission: RE | Admit: 2018-05-24 | Discharge: 2018-05-24 | Disposition: A | Discharge status: Home / Self Care | Payer: Medicare Other | Source: Ambulatory Visit | Attending: Nurse Practitioner | Admitting: Nurse Practitioner

## 2018-05-24 ENCOUNTER — Encounter (HOSPITAL_COMMUNITY): Payer: Self-pay | Admitting: Nurse Practitioner

## 2018-05-24 VITALS — BP 126/64 | HR 77 | Ht 73.0 in | Wt 388.0 lb

## 2018-05-24 DIAGNOSIS — G4733 Obstructive sleep apnea (adult) (pediatric): Secondary | ICD-10-CM | POA: Diagnosis not present

## 2018-05-24 DIAGNOSIS — E785 Hyperlipidemia, unspecified: Secondary | ICD-10-CM | POA: Insufficient documentation

## 2018-05-24 DIAGNOSIS — I10 Essential (primary) hypertension: Secondary | ICD-10-CM | POA: Insufficient documentation

## 2018-05-24 DIAGNOSIS — Z823 Family history of stroke: Secondary | ICD-10-CM | POA: Insufficient documentation

## 2018-05-24 DIAGNOSIS — Z6841 Body Mass Index (BMI) 40.0 and over, adult: Secondary | ICD-10-CM | POA: Insufficient documentation

## 2018-05-24 DIAGNOSIS — Z888 Allergy status to other drugs, medicaments and biological substances status: Secondary | ICD-10-CM | POA: Insufficient documentation

## 2018-05-24 DIAGNOSIS — Z8249 Family history of ischemic heart disease and other diseases of the circulatory system: Secondary | ICD-10-CM | POA: Diagnosis not present

## 2018-05-24 DIAGNOSIS — M069 Rheumatoid arthritis, unspecified: Secondary | ICD-10-CM | POA: Diagnosis not present

## 2018-05-24 DIAGNOSIS — Z882 Allergy status to sulfonamides status: Secondary | ICD-10-CM | POA: Insufficient documentation

## 2018-05-24 DIAGNOSIS — Z8589 Personal history of malignant neoplasm of other organs and systems: Secondary | ICD-10-CM | POA: Diagnosis not present

## 2018-05-24 DIAGNOSIS — Z79899 Other long term (current) drug therapy: Secondary | ICD-10-CM | POA: Insufficient documentation

## 2018-05-24 DIAGNOSIS — I4819 Other persistent atrial fibrillation: Secondary | ICD-10-CM | POA: Diagnosis not present

## 2018-05-24 DIAGNOSIS — Z7901 Long term (current) use of anticoagulants: Secondary | ICD-10-CM | POA: Insufficient documentation

## 2018-05-24 DIAGNOSIS — F17210 Nicotine dependence, cigarettes, uncomplicated: Secondary | ICD-10-CM | POA: Insufficient documentation

## 2018-05-24 LAB — BASIC METABOLIC PANEL
Anion gap: 10 (ref 5–15)
BUN: 17 mg/dL (ref 8–23)
CO2: 31 mmol/L (ref 22–32)
Calcium: 9 mg/dL (ref 8.9–10.3)
Chloride: 98 mmol/L (ref 98–111)
Creatinine, Ser: 0.79 mg/dL (ref 0.61–1.24)
GFR calc Af Amer: >60 mL/min (ref >60)
GFR calc non Af Amer: >60 mL/min (ref >60)
Glucose, Bld: 125 mg/dL — ABNORMAL HIGH (ref 70–99)
Potassium: 4.1 mmol/L (ref 3.5–5.1)
Sodium: 139 mmol/L (ref 135–145)

## 2018-05-24 LAB — MAGNESIUM: Magnesium: 2.4 mg/dL (ref 1.7–2.4)

## 2018-05-24 NOTE — Progress Notes (Signed)
Primary Care Physician: Patient, [name] Primary Cardiologist: Dr Harl Bowie Primary Electrophysiologist: Dr Rayann Heman Referring Physician: Dr Rayann Heman   Jesus Rodriguez is a 68 y.o. male with a history of persistent atrial fibrillation, OSA, atrial flutter s/p ablation 2017, HTN, HLD, and RA who presented for consultation in the [facility].  The patient is s/p atrial flutter ablation in 2017 at [facility]. Maintained on sotalol. He did well until October 2019 he he noted an irregular pulse. Underwent DCCV 02/10/18 with quick return to afib. He continues to have increased shortness of breath and edema. He was admitted ofr tikosyn 05/11/18.  He returns 05/24/18 to afib clinic. He is in SR. He feels improved. Being compliant with dofetilide 500 mcgs bid. On warfarin.  Today, he denies symptoms of palpitations, chest pain, orthopnea, PND, dizziness, presyncope, syncope, snoring, daytime somnolence, bleeding, or neurologic sequela. The patient is tolerating medications without difficulties and is otherwise without complaint today.    Atrial Fibrillation Risk Factors:  he does have symptoms or diagnosis of sleep apnea. he is compliant with CPAP therapy.  he does not have a history of rheumatic fever.  he does not have a history of alcohol use.  he has a BMI of Body mass index is 51.19 kg/m.Marland Kitchen Filed Weights   05/24/18 1330  Weight: (!) 176 kg     Atrial Fibrillation Management history:  Previous antiarrhythmic drugs: sotalol  Previous cardioversions: 02/10/18  Previous ablations: Flutter ablation 2017 at [facility] score: 2  Anticoagulation history: Warfarin   Past Medical History:  Diagnosis Date  . Anxiety 02/13/2016  . Atrial flutter ([place]) 02/13/2016   s/p CTI ablation at [facility]  . DDD (degenerative disc disease), lumbar 02/13/2016  . Elevated cholesterol 02/13/2016  . Hypertension 02/13/2016  . Insomnia 02/13/2016  . Osteoarthritis of both hands  02/13/2016  . Osteoarthritis of both knees 02/13/2016  . Persistent atrial fibrillation   . RA (rheumatoid arthritis) ([place]) 02/13/2016   Sero Negative  . 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 Sleep apnea 02/13/2016   Past Surgical History:  Procedure Laterality Date  . A-FLUTTER ABLATION     at [facility] 2018  . CARDIOVERSION  02/10/2018  . CARDIOVERSION N/A 05/13/2018   Procedure: CARDIOVERSION;  Surgeon: Sanda Klein, MD;  Location: [place] [facility];  Service: Cardiovascular;  Laterality: N/A;  . CATARACT EXTRACTION      Current Outpatient Medications  Medication Sig Dispense Refill  . acetaminophen (TYLENOL) 500 MG tablet Take by mouth as needed.     . benazepril (LOTENSIN) 20 MG tablet Take by mouth.    . dofetilide (TIKOSYN) 500 MCG capsule Take 1 capsule (500 mcg total) by mouth 2 (two) times daily. 60 capsule 6  . doxepin (SINEQUAN) 25 MG capsule Take by mouth.    . doxycycline (VIBRAMYCIN) 100 MG capsule Take 100 mg by mouth daily.     Marland Kitchen HYDROcodone-acetaminophen (NORCO/VICODIN) 5-325 MG tablet Take 1 tablet by mouth 2 (two) times daily as needed for moderate pain. 60 tablet 0  . metoprolol tartrate (LOPRESSOR) 25 MG tablet Take 1 tablet (25 mg total) by mouth 2 (two) times daily. 180 tablet 3  . simvastatin (ZOCOR) 20 MG tablet Take by mouth.    . tamsulosin (FLOMAX) 0.4 MG CAPS capsule Take by mouth daily.    Marland Kitchen tiotropium (SPIRIVA) 18 MCG inhalation capsule Place 18 mcg into inhaler and inhale daily.    Marland Kitchen torsemide (DEMADEX) 20 MG tablet Take 2 tablets (40 mg  total) by mouth 2 (two) times daily.    Marland Kitchen warfarin (COUMADIN) 10 MG tablet Take 0-10 mg by mouth daily at 6 PM. 10 mg daily except 5 mg on Tuesday and none on Saturdays     No current facility-administered medications for this encounter.     Allergies  Allergen Reactions  . Celebrex [Celecoxib]   . Methotrexate Derivatives Other (See Comments)    Arrhythmia   . Sulfa Antibiotics     Social History   Socioeconomic History   . Marital status: Divorced    Spouse name: Not on file  . Number of children: Not on file  . Years of education: Not on file  . Highest education level: Not on file  Occupational History  . Not on file  Social Needs  . Financial resource strain: Not on file  . Food insecurity:    Worry: Not on file    Inability: Not on file  . Transportation needs:    Medical: Not on file    Non-medical: Not on file  Tobacco Use  . Smoking status: Current Every Day Smoker    Packs/day: 2.00    Years: 35.00    Pack years: 70.00    Types: Cigarettes  . Smokeless tobacco: Never Used  Substance and Sexual Activity  . Alcohol use: No  . Drug use: No  . Sexual activity: Not on file  Lifestyle  . Physical activity:    Days per week: Not on file    Minutes per session: Not on file  . Stress: Not on file  Relationships  . Social connections:    Talks on phone: Not on file    Gets together: Not on file    Attends religious service: Not on file    Active member of club or organization: Not on file    Attends meetings of clubs or organizations: Not on file    Relationship status: Not on file  . Intimate partner violence:    Fear of current or ex partner: Not on file    Emotionally abused: Not on file    Physically abused: Not on file    Forced sexual activity: Not on file  Other Topics Concern  . Not on file  Social History Narrative  . Not on file    Family History  Problem Relation Age of Onset  . Hypertension Father   . Heart attack Father   . Cancer Father        prostate  . Hypertension Sister   . Hypertension Sister   . Hypertension Sister   . Epilepsy Son   . Diabetes Son     ROS- All systems are reviewed and negative except as per the HPI above.  Physical Exam: Vitals:   05/24/18 1330  BP: 126/64  Pulse: 77  Weight: (!) 176 kg  Height: 6\' 1"  (1.854 m)    GEN- The patient is well appearing, obese male, alert and oriented x 3 today.   Head- normocephalic,  atraumatic Eyes-  Sclera clear, conjunctiva pink Ears- hearing intact Oropharynx- clear Neck- supple  Lungs- Clear to ausculation bilaterally, normal work of breathing Heart- irregular rate and rhythm, tachycardia, no murmurs, rubs or gallops  GI- soft, NT, ND, + BS Extremities- no clubbing, cyanosis, or edema MS- no significant deformity or atrophy Skin- no rash or lesion Psych- euthymic mood, full affect Neuro- strength and sensation are intact  Wt Readings from Last 3 Encounters:  05/24/18 (!) 176 kg  05/14/18 Marland Kitchen)  175.3 kg  05/11/18 (!) 178.7 kg    EKG today demonstrates atrial fibrillation HR 126, NST, QRS 82, QTc 422  Epic records are reviewed at length today  Assessment and Plan:  1. Persistent atrial fibrillation S/p atrial flutter ablation at [facility] 2017. Persistent afib since 01/2018. Failed sotalol. Admitted for dofetilide loading 1/28-1/31  Staying in SR and feels improved  Dofetilide precautions discussed Continue warfarin. Continue Lopressor 25 mg BID  bmet/mag today  This patients CHA2DS2-VASc Score and unadjusted Ischemic Stroke Rate (% per year) is equal to 2.2 % stroke rate/year from a score of 2  Above score calculated as 1 point each if present [CHF, HTN, DM, Vascular=MI/PAD/Aortic Plaque, Age if 65-74, or Male] Above score calculated as 2 points each if present [Age > 75, or Stroke/TIA/TE]  2. Morbid obesity Body mass index is 51.19 kg/m. Encouraged lifestyle modifications.  3. OSA Compliant with CPAP therapy.  4. HTN Stable, no changes today  F/u with Dr. Rayann Heman 06/18/18 afib clinic as needed  Roderic Palau, NP 05/24/2018 1:45 PM

## 2018-05-27 ENCOUNTER — Ambulatory Visit (INDEPENDENT_AMBULATORY_CARE_PROVIDER_SITE_OTHER): Payer: Medicare Other | Admitting: Pharmacist

## 2018-05-27 DIAGNOSIS — Z5181 Encounter for therapeutic drug level monitoring: Secondary | ICD-10-CM

## 2018-05-27 DIAGNOSIS — I4891 Unspecified atrial fibrillation: Secondary | ICD-10-CM

## 2018-05-27 DIAGNOSIS — I4819 Other persistent atrial fibrillation: Secondary | ICD-10-CM

## 2018-05-27 LAB — POCT INR: INR: 4.2 — AB (ref 2.0–3.0)

## 2018-05-27 NOTE — Patient Instructions (Addendum)
Description   No warfarin today then change dose to 5mg  daily, except 10mg  on Mondays, Wednesdays and Fridays. Recheck INR in 2 weeks.  Call and find out if need to hold warfarin for procedure. If you do need to hold warfarin, please ask to delay procedure until after the first week in March.

## 2018-06-10 ENCOUNTER — Ambulatory Visit (INDEPENDENT_AMBULATORY_CARE_PROVIDER_SITE_OTHER): Payer: Medicare Other | Admitting: *Deleted

## 2018-06-10 DIAGNOSIS — Z5181 Encounter for therapeutic drug level monitoring: Secondary | ICD-10-CM

## 2018-06-10 DIAGNOSIS — I4891 Unspecified atrial fibrillation: Secondary | ICD-10-CM | POA: Diagnosis not present

## 2018-06-10 DIAGNOSIS — I4819 Other persistent atrial fibrillation: Secondary | ICD-10-CM

## 2018-06-10 LAB — POCT INR: INR: 2.7 (ref 2.0–3.0)

## 2018-06-10 MED ORDER — TORSEMIDE 20 MG PO TABS: 40.0000 mg | ORAL_TABLET | Freq: Two times a day (BID) | ORAL | 2 refills | Status: DC

## 2018-06-10 NOTE — Patient Instructions (Signed)
Continue coumadin 5mg  daily, except 10mg  on Mondays, Wednesdays and Fridays. Recheck INR in 2 weeks.  Pending excision of cyst on 3/26.  May not need to come off coumadin.

## 2018-06-18 ENCOUNTER — Telehealth: Payer: Self-pay | Admitting: Rheumatology

## 2018-06-18 ENCOUNTER — Encounter: Payer: Self-pay | Admitting: Internal Medicine

## 2018-06-18 ENCOUNTER — Ambulatory Visit: Payer: Medicare Other | Admitting: Internal Medicine

## 2018-06-18 VITALS — BP 149/74 | HR 80 | Ht 73.0 in | Wt 394.0 lb

## 2018-06-18 DIAGNOSIS — I10 Essential (primary) hypertension: Secondary | ICD-10-CM | POA: Diagnosis not present

## 2018-06-18 DIAGNOSIS — G4733 Obstructive sleep apnea (adult) (pediatric): Secondary | ICD-10-CM

## 2018-06-18 DIAGNOSIS — I4819 Other persistent atrial fibrillation: Secondary | ICD-10-CM

## 2018-06-18 DIAGNOSIS — Z5181 Encounter for therapeutic drug level monitoring: Secondary | ICD-10-CM

## 2018-06-18 DIAGNOSIS — G8929 Other chronic pain: Secondary | ICD-10-CM

## 2018-06-18 NOTE — Telephone Encounter (Signed)
Last Visit: 03/16/18 Next visit: 09/16/18 UDS: 02/17/18 Narc Agreement: 02/17/18  Attempted to contact the patient and unable to leave a message, voicemail not set up. Patient is due to update UDS and Narcotic Agreement.

## 2018-06-18 NOTE — Telephone Encounter (Signed)
Patient advised he is due to update narcotic agreement and UDS. Patient will come 06/21/18.

## 2018-06-18 NOTE — Progress Notes (Signed)
PCP: Patient, [name] Primary Cardiologist: Dr Harl Bowie Primary EP: Dr Rayann Heman  Jesus Rodriguez is a 68 y.o. male who presents today for routine electrophysiology followup.  Since starting tikosyn, the patient reports doing very well.  Today, he denies symptoms of palpitations, chest pain, shortness of breath,  lower extremity edema, dizziness, presyncope, or syncope.  The patient is otherwise without complaint today.   Past Medical History:  Diagnosis Date  . Anxiety 02/13/2016  . Atrial flutter ([place]) 02/13/2016   s/p CTI ablation at [facility]  . DDD (degenerative disc disease), lumbar 02/13/2016  . Elevated cholesterol 02/13/2016  . Hypertension 02/13/2016  . Insomnia 02/13/2016  . Osteoarthritis of both hands 02/13/2016  . Osteoarthritis of both knees 02/13/2016  . Persistent atrial fibrillation   . RA (rheumatoid arthritis) ([place]) 02/13/2016   Sero Negative  . 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 Sleep apnea 02/13/2016   Past Surgical History:  Procedure Laterality Date  . A-FLUTTER ABLATION     at [facility] 2018  . CARDIOVERSION  02/10/2018  . CARDIOVERSION N/A 05/13/2018   Procedure: CARDIOVERSION;  Surgeon: Sanda Klein, MD;  Location: [facility];  Service: Cardiovascular;  Laterality: N/A;  . CATARACT EXTRACTION      ROS- all systems are reviewed and negatives except as per HPI above  Current Outpatient Medications  Medication Sig Dispense Refill  . acetaminophen (TYLENOL) 500 MG tablet Take by mouth as needed.     . benazepril (LOTENSIN) 20 MG tablet Take by mouth.    . dofetilide (TIKOSYN) 500 MCG capsule Take 1 capsule (500 mcg total) by mouth 2 (two) times daily. 60 capsule 6  . doxepin (SINEQUAN) 25 MG capsule Take by mouth.    . doxycycline (VIBRAMYCIN) 100 MG capsule Take 100 mg by mouth daily.     Marland Kitchen HYDROcodone-acetaminophen (NORCO/VICODIN) 5-325 MG tablet Take 1 tablet by mouth 2 (two) times daily as needed for moderate pain. 60 tablet 0  . loratadine (CLARITIN) 10 MG  tablet Take 10 mg by mouth daily.    . metoprolol tartrate (LOPRESSOR) 25 MG tablet Take 1 tablet (25 mg total) by mouth 2 (two) times daily. 180 tablet 3  . simvastatin (ZOCOR) 20 MG tablet Take by mouth.    . tamsulosin (FLOMAX) 0.4 MG CAPS capsule Take by mouth daily.    Marland Kitchen tiotropium (SPIRIVA) 18 MCG inhalation capsule Place 18 mcg into inhaler and inhale daily.    Marland Kitchen torsemide (DEMADEX) 20 MG tablet Take 2 tablets (40 mg total) by mouth 2 (two) times daily. 120 tablet 2  . warfarin (COUMADIN) 10 MG tablet Take 0-10 mg by mouth daily at 6 PM. 10 mg daily except 5 mg on Tuesday and none on Saturdays     No current facility-administered medications for this visit.     Physical Exam: Vitals:   06/18/18 1203  BP: (!) 149/74  Pulse: 80  Weight: (!) 394 lb (178.7 kg)  Height: 6\' 1"  (1.854 m)    GEN- The patient is well appearing, alert and oriented x 3 today.   Head- normocephalic, atraumatic Eyes-  Sclera clear, conjunctiva pink Ears- hearing intact Oropharynx- clear Lungs- Clear to ausculation bilaterally, normal work of breathing Heart- Regular rate and rhythm, no murmurs, rubs or gallops, PMI not laterally displaced GI- soft, NT, ND, + BS Extremities- no clubbing, cyanosis, or edema  Wt Readings from Last 3 Encounters:  06/18/18 (!) 394 lb (178.7 kg)  05/24/18 (!) 388 lb (176 kg)  05/14/18 Marland Kitchen)  386 lb 6.4 oz (175.3 kg)    EKG tracing ordered today is personally reviewed and shows sinus rhythm, QTc 432 msec  Assessment and Plan:  1. Persistent afib/ atrial flutter S/p atrial flutter ablation at [facility] in 2017 Recently started on tikosyn Doing well chads2vasc score is 2.  On coumadin Labs 05/24/2018 reviewed today  2. Morbid obesity Body mass index is 51.98 kg/m. Lifestyle modification again discussed today  3. HTN Stable No change required today  4. OSA Compliant with CPAP  Follow-up with Dr Harl Bowie in 3 months Will need ekg, bmet, mg then I will see again in 6  months  Thompson Grayer MD, [facility] 06/18/2018 12:27 PM

## 2018-06-18 NOTE — Patient Instructions (Signed)
Your physician wants you to follow-up in: [place] will receive a reminder letter in the mail two months in advance. If you don't receive a letter, please call our office to schedule the follow-up appointment.  Your physician recommends that you continue on your current medications as directed. Please refer to the Current Medication list given to you today.  Thank you for choosing [facility]!!

## 2018-06-18 NOTE — Telephone Encounter (Signed)
Patient called requesting prescription [facility] [address] in [place], [place].

## 2018-06-21 ENCOUNTER — Other Ambulatory Visit: Payer: Self-pay

## 2018-06-21 DIAGNOSIS — G8929 Other chronic pain: Secondary | ICD-10-CM

## 2018-06-21 DIAGNOSIS — Z5181 Encounter for therapeutic drug level monitoring: Secondary | ICD-10-CM

## 2018-06-22 ENCOUNTER — Telehealth: Payer: Self-pay | Admitting: Rheumatology

## 2018-06-22 MED ORDER — HYDROCODONE-ACETAMINOPHEN 5-325 MG PO TABS: 1.0000 | ORAL_TABLET | Freq: Two times a day (BID) | ORAL | 0 refills | Status: DC | PRN

## 2018-06-22 NOTE — Telephone Encounter (Signed)
Patient called stating he came to the office and updated his narcotic agreement and is checking on the status of his prescription refill of Hydrocodone.

## 2018-06-22 NOTE — Telephone Encounter (Signed)
Last Visit: 03/16/18 Next visit: 09/16/18 UDS: 06/22/18 Narc Agreement: 06/22/18  Okay to refill Hydrocodone?

## 2018-06-24 LAB — PAIN MGMT, PROFILE 5 W/CONF, U
Amphetamines: NEGATIVE ng/mL (ref <500)
Barbiturates: NEGATIVE ng/mL (ref <300)
Benzodiazepines: NEGATIVE ng/mL (ref <100)
Cocaine Metabolite: NEGATIVE ng/mL (ref <150)
Codeine: NEGATIVE ng/mL (ref <50)
Creatinine: 97.2 mg/dL
Hydrocodone: 65 ng/mL — ABNORMAL HIGH (ref <50)
Hydromorphone: 166 ng/mL — ABNORMAL HIGH (ref <50)
Marijuana Metabolite: NEGATIVE ng/mL (ref <20)
Methadone Metabolite: NEGATIVE ng/mL (ref <100)
Morphine: NEGATIVE ng/mL (ref <50)
Norhydrocodone: 263 ng/mL — ABNORMAL HIGH (ref <50)
Opiates: POSITIVE ng/mL — AB (ref <100)
Oxidant: NEGATIVE ug/mL (ref <200)
Oxycodone: NEGATIVE ng/mL (ref <100)
pH: 7.49 (ref 4.5–9.0)

## 2018-06-29 ENCOUNTER — Other Ambulatory Visit: Payer: Self-pay

## 2018-06-29 ENCOUNTER — Ambulatory Visit (INDEPENDENT_AMBULATORY_CARE_PROVIDER_SITE_OTHER): Payer: Medicare Other | Admitting: *Deleted

## 2018-06-29 DIAGNOSIS — Z5181 Encounter for therapeutic drug level monitoring: Secondary | ICD-10-CM

## 2018-06-29 DIAGNOSIS — I4891 Unspecified atrial fibrillation: Secondary | ICD-10-CM | POA: Diagnosis not present

## 2018-06-29 LAB — POCT INR: INR: 2.8 (ref 2.0–3.0)

## 2018-06-29 NOTE — Patient Instructions (Signed)
Continue coumadin 5mg daily, except 10mg on Mondays, Wednesdays and Fridays. Recheck INR in 4 weeks.  Pending excision of cyst.  May not need to come off coumadin. 

## 2018-07-20 ENCOUNTER — Telehealth: Payer: Self-pay | Admitting: *Deleted

## 2018-07-20 NOTE — Telephone Encounter (Signed)
Pt verbally consented to telehealth phone appt with Dr Harl Bowie. Meds/pharmacy/allergies reviewed - pt can check HR at home and will have that and weight available for tomorrow's appt.

## 2018-07-21 ENCOUNTER — Other Ambulatory Visit: Payer: Self-pay

## 2018-07-21 ENCOUNTER — Encounter: Payer: Self-pay | Admitting: Cardiology

## 2018-07-21 ENCOUNTER — Telehealth (INDEPENDENT_AMBULATORY_CARE_PROVIDER_SITE_OTHER): Payer: Medicare Other | Admitting: Cardiology

## 2018-07-21 ENCOUNTER — Ambulatory Visit: Payer: Medicare Other | Admitting: Cardiology

## 2018-07-21 VITALS — HR 75 | Ht 73.0 in | Wt 388.0 lb

## 2018-07-21 DIAGNOSIS — I4891 Unspecified atrial fibrillation: Secondary | ICD-10-CM

## 2018-07-21 DIAGNOSIS — I5032 Chronic diastolic (congestive) heart failure: Secondary | ICD-10-CM | POA: Diagnosis not present

## 2018-07-21 NOTE — Progress Notes (Signed)
Virtual Visit via Telephone Note   This visit type was conducted due to national recommendations for restrictions regarding the COVID-19 Pandemic (e.g. social distancing) in an effort to limit this patient's exposure and mitigate transmission in our community.  Due to his co-morbid illnesses, this patient is at least at moderate risk for complications without adequate follow up.  This format is felt to be most appropriate for this patient at this time.  The patient did not have access to video technology/had technical difficulties with video requiring transitioning to audio format only (telephone).  All issues noted in this document were discussed and addressed.  No physical exam could be performed with this format.  Please refer to the patient's chart for his  consent to telehealth for [facility].   Evaluation Performed:  Follow-up visit  Date:  07/21/2018   ID:  Jesus Rodriguez, DOB 1950/06/04, MRN [id number]  Patient Location: Home  Provider Location: Office  PCP:  Patient, [name]  Cardiologist:  Carlyle Dolly, MD  Electrophysiologist:  Thompson Grayer, MD   Chief Complaint:    History of Present Illness:    Jesus Rodriguez is a 68 y.o. male who presents via audio/video conferencing for a telehealth visit today.    1. Afib and flutter - notes indicate history of typical flutter s/p CTI ablation 03/2016 - history of afibPreiviously followed by [facility] - failed sotalol, started on dofetilide, followed by EP Dr Rayann Heman  - no recent palpitations - compliant with meds. No bleeding on coumadin     2.Chronic diastolic HF - recent LE edema ongoing 3 weeks and weight gain - some orthopnea, +DOE. - does not check weights regularlyat home. 08/2017 374lbs12/3/19 389 lbs Today 396lbs   - last visit we changed lasix to 40mg  bid without improvement, we later changed to torsemide 20mg  bid and then increased to 40mg  bid just on Jan 14.   - home weightrs 388 lbs. No recent  edema. Some DOE that is chronic, exertion significantly limited by knee pains. No orthopnea. - remains on torsemide 40mg  bid.      The patient does not have symptoms concerning for COVID-19 infection (fever, chills, cough, or new shortness of breath).    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     Allergies:   Celebrex [celecoxib]; Methotrexate derivatives; and Sulfa antibiotics   Social History   Tobacco Use   Smoking status: Current Every Day Smoker    Packs/day: 2.00    Years: 35.00    Pack years: 70.00    Types: Cigarettes   Smokeless tobacco:  Never Used  Substance Use Topics   Alcohol use: No   Drug use: No     Family Hx: The patient's family history includes Cancer in his father; Diabetes in his son; Epilepsy in his son; Heart attack in his father; Hypertension in his father, sister, sister, and sister.  ROS:   Please see the history of present illness.     All other systems reviewed and are negative.   Prior CV studies:   The following studies were reviewed today:  12/2017  echo Dr Cammy Copa office LVEF 70%, no significant pathology   Labs/Other Tests and Data Reviewed:    EKG:  na  Recent Labs: 04/16/2018: TSH 1.56 05/24/2018: BUN 17; Creatinine, Ser 0.79; Magnesium 2.4; Potassium 4.1; Sodium 139   Recent Lipid Panel No results found for: CHOL, TRIG, HDL, CHOLHDL, LDLCALC, LDLDIRECT  Wt Readings from Last 3 Encounters:  06/18/18 (!) 394 lb (178.7 kg)  05/24/18 (!) 388 lb (176 kg)  05/14/18 (!) 386 lb 6.4 oz (175.3 kg)     Objective:    Vital Signs:  There were no vitals taken for this visit.   Normal affect, normal speech patterns. Sounds comfortable in no distress.   ASSESSMENT & PLAN:    1. Aflutter/Afib - history of aflutter ablation. Also with afib previously followed by [facility] EP - failed sotalol, recently started on dofetilide. Mainting SR.  - continue current meds, continue anticoag - needs BMET/Mg/and EKG at our f/u in June   2. Chronic diastolic HF - weights trending down on higher diuretic dosing and also mainting SR - would like to repeat echo once COVID-19 limitations are lifted - continue current diuretic, check labs at next visit in June    COVID-19 Education: The signs and symptoms of COVID-19 were discussed with the patient and how to seek care for testing (follow up with PCP or arrange E-visit).  The importance of social distancing was discussed today.  Time:   Today, I have spent 18 minutes with the patient with telehealth technology discussing the above problems.     Medication Adjustments/Labs and Tests Ordered: Current medicines are reviewed at length with the patient today.  Concerns regarding medicines are outlined above.  Tests Ordered: No orders of the defined types were placed in this encounter.  Medication Changes: No orders of the defined types were placed in this encounter.   Disposition:  Follow up 2 months  Signed, Carlyle Dolly, MD  07/21/2018 8:07 AM    [place]

## 2018-07-21 NOTE — Patient Instructions (Signed)
Your physician recommends that you schedule a follow-up appointment in: 2 MONTHS WITH DR BRANCH  Your physician recommends that you continue on your current medications as directed. Please refer to the Current Medication list given to you today.  Thank you for choosing [facility]!!    

## 2018-07-22 ENCOUNTER — Telehealth: Payer: Self-pay | Admitting: Rheumatology

## 2018-07-22 MED ORDER — HYDROCODONE-ACETAMINOPHEN 5-325 MG PO TABS: 1.0000 | ORAL_TABLET | Freq: Two times a day (BID) | ORAL | 0 refills | Status: DC | PRN

## 2018-07-22 NOTE — Telephone Encounter (Signed)
Last Visit: 03/16/18 Next visit: 09/16/18 UDS: 06/22/18 Narc Agreement: 06/22/18  Last Fill: 06/22/18  Okay to refill Hydrocodone?

## 2018-07-22 NOTE — Telephone Encounter (Signed)
Patient called requesting prescription refill of [facility] [address] [facility] [place], [place] 

## 2018-07-26 ENCOUNTER — Telehealth: Payer: Self-pay | Admitting: *Deleted

## 2018-07-26 NOTE — Telephone Encounter (Signed)
° °  COVID-19 Pre-Screening Questions: ° °• Do you currently have a fever?NO ° ° °• Have you recently travelled on a cruise, internationally, or to [place], [place], [place], [place], [place], or [place], [place] ([employer]) ? NO °•  °• Have you been in contact with someone that is currently pending confirmation of Covid19 testing or has been confirmed to have the Covid19 virus?  NO °•  °Are you currently experiencing fatigue or cough? NO ° ° °   ° ° ° ° °

## 2018-08-02 NOTE — Telephone Encounter (Signed)

## 2018-08-03 ENCOUNTER — Ambulatory Visit (INDEPENDENT_AMBULATORY_CARE_PROVIDER_SITE_OTHER): Payer: Medicare Other | Admitting: *Deleted

## 2018-08-03 ENCOUNTER — Other Ambulatory Visit: Payer: Self-pay

## 2018-08-03 DIAGNOSIS — I4891 Unspecified atrial fibrillation: Secondary | ICD-10-CM | POA: Diagnosis not present

## 2018-08-03 DIAGNOSIS — Z5181 Encounter for therapeutic drug level monitoring: Secondary | ICD-10-CM

## 2018-08-03 LAB — POCT INR: INR: 1.9 — AB (ref 2.0–3.0)

## 2018-08-03 NOTE — Patient Instructions (Signed)
Take coumadin 10mg  tonight then resume 5mg  daily, except 10mg  on Mondays, Wednesdays and Fridays. Recheck INR in 4 weeks.  Pending excision of cyst.  May not need to come off coumadin.

## 2018-08-13 ENCOUNTER — Other Ambulatory Visit: Payer: Self-pay | Admitting: Cardiology

## 2018-08-13 MED ORDER — WARFARIN SODIUM 10 MG PO TABS: ORAL_TABLET | ORAL | 1 refills | Status: DC

## 2018-08-13 NOTE — Telephone Encounter (Signed)
° ° ° °  1. Which medications need to be refilled? (please list name of each medication and dose if known)    warfarin (COUMADIN) 10 MG tablet    2. Which pharmacy/location (including street and city if local pharmacy) is medication to be [facility] to[facility] [address], [place], [place]   3. Do they need a 30 day or 90 day supply?

## 2018-08-20 ENCOUNTER — Telehealth: Payer: Self-pay | Admitting: Rheumatology

## 2018-08-20 MED ORDER — HYDROCODONE-ACETAMINOPHEN 5-325 MG PO TABS: 1.0000 | ORAL_TABLET | Freq: Two times a day (BID) | ORAL | 0 refills | Status: DC | PRN

## 2018-08-20 NOTE — Telephone Encounter (Signed)
Patient called requesting prescription refill of [facility] [address] [facility] [place], [place] 

## 2018-08-20 NOTE — Telephone Encounter (Signed)
Last Visit: 03/16/18 Next visit: 09/16/18 UDS: 06/21/18 Narc Agreement: 06/21/18  Okay to refill Hydrocodone?

## 2018-08-24 NOTE — Progress Notes (Deleted)
Office Visit Note  Patient: Jesus Rodriguez             Date of Birth: [birth year]           MRN: [id number]             PCP: Patient, [name] Referring: Macon Large, MD Visit Date: 08/31/2018 Occupation: @GUAROCC @  Subjective:  No chief complaint on file.   History of Present Illness: Jesus Rodriguez is a 68 y.o. male ***   Activities of Daily Living:  Patient reports morning stiffness for *** {minute/hour:19697}.   Patient {ACTIONS;DENIES/REPORTS:21021675::"Denies"} nocturnal pain.  Difficulty dressing/grooming: {ACTIONS;DENIES/REPORTS:21021675::"Denies"} Difficulty climbing stairs: {ACTIONS;DENIES/REPORTS:21021675::"Denies"} Difficulty getting out of chair: {ACTIONS;DENIES/REPORTS:21021675::"Denies"} Difficulty using hands for taps, buttons, cutlery, and/or writing: {ACTIONS;DENIES/REPORTS:21021675::"Denies"}  No Rheumatology ROS completed.   PMFS History:  Patient Active Problem List   Diagnosis Date Noted  . Atrial fibrillation ([place]) 05/11/2018  . Encounter for therapeutic drug monitoring 04/22/2018  . Persistent atrial fibrillation 04/08/2018  . History of rheumatoid arthritis seronegative 09/10/2017  . High risk medications (not anticoagulants) long-term use 02/14/2016  . Class 3 obesity in adult 02/14/2016  . Depression 02/14/2016  . Osteoarthritis of both hands 02/13/2016  . Osteoarthritis of both knees 02/13/2016  . DDD (degenerative disc disease), lumbar 02/13/2016  . 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 Hypertension 02/13/2016  . Elevated cholesterol 02/13/2016  . Insomnia 02/13/2016  . Anxiety 02/13/2016  . Sleep apnea 02/13/2016  . Arrhythmia 02/13/2016    Past Medical History:  Diagnosis Date  . Anxiety 02/13/2016  . Atrial flutter ([place]) 02/13/2016   s/p CTI ablation at [facility]  . DDD (degenerative disc disease), lumbar 02/13/2016  . Elevated cholesterol 02/13/2016  . Hypertension 02/13/2016  . Insomnia 02/13/2016  . Osteoarthritis of both hands  02/13/2016  . Osteoarthritis of both knees 02/13/2016  . Persistent atrial fibrillation   . RA (rheumatoid arthritis) ([place]) 02/13/2016   Sero Negative  . 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 Sleep apnea 02/13/2016    Family History  Problem Relation Age of Onset  . Hypertension Father   . Heart attack Father   . Cancer Father        prostate  . Hypertension Sister   . Hypertension Sister   . Hypertension Sister   . Epilepsy Son   . Diabetes Son    Past Surgical History:  Procedure Laterality Date  . A-FLUTTER ABLATION     at [facility] 2018  . CARDIOVERSION  02/10/2018  . CARDIOVERSION N/A 05/13/2018   Procedure: CARDIOVERSION;  Surgeon: Sanda Klein, MD;  [facility];  Service: Cardiovascular;  Laterality: N/A;  . CATARACT EXTRACTION     Social History   Social History Narrative  . Not on file   Immunization History  Administered Date(s) Administered  . Influenza, High Dose Seasonal PF 05/14/2018     Objective: Vital Signs: There were no vitals taken for this visit.   Physical Exam   Musculoskeletal Exam: ***  CDAI Exam: CDAI Score: Not documented Patient Global Assessment: Not documented; Provider Global Assessment: Not documented Swollen: Not documented; Tender: Not documented Joint Exam   Not documented   There is currently no information documented on the homunculus. Go to the Rheumatology activity and complete the homunculus joint exam.  Investigation: No additional findings.  Imaging: No results found.  Recent Labs: Lab Results  Component Value Date   NA 139 05/24/2018   K 4.1 05/24/2018   CL 98 05/24/2018   CO2 31 05/24/2018  GLUCOSE 125 (H) 05/24/2018   BUN 17 05/24/2018   CREATININE 0.79 05/24/2018   BILITOT 0.4 08/20/2016   ALKPHOS 86 08/20/2016   AST 15 08/20/2016   ALT 14 08/20/2016   PROT 7.0 08/20/2016   ALBUMIN 4.1 08/20/2016   CALCIUM 9.0 05/24/2018   GFRAA >60 05/24/2018    Speciality Comments: No specialty comments  available.  Procedures:  No procedures performed Allergies: Celebrex [celecoxib]; Methotrexate derivatives; and Sulfa antibiotics   Assessment / Plan:     Visit Diagnoses: No diagnosis found.   Orders: No orders of the defined types were placed in this encounter.  No orders of the defined types were placed in this encounter.   Face-to-face time spent with patient was *** minutes. Greater than 50% of time was spent in counseling and coordination of care.  Follow-Up Instructions: No follow-ups on file.   Earnestine Mealing, CMA  Note - This record has been created using Editor, commissioning.  Chart creation errors have been sought, but may not always  have been located. Such creation errors do not reflect on  the standard of medical care.

## 2018-08-31 ENCOUNTER — Ambulatory Visit: Payer: Self-pay | Admitting: Rheumatology

## 2018-09-01 ENCOUNTER — Telehealth: Payer: Self-pay | Admitting: *Deleted

## 2018-09-01 NOTE — Telephone Encounter (Signed)
° °  COVID-19 Pre-Screening Questions: ° °• Do you currently have a fever? No °•  °• Have you recently travelled on a cruise, internationally, or to [place], [place], [place], [place], [place], or [place], [place] ([place]) ?NO °•  °• Have you been in contact with someone that is currently pending confirmation of Covid19 testing or has been confirmed to have the Covid19 virus?  NO °•  °Are you currently experiencing fatigue or cough? NO ° ° °   ° ° ° ° °

## 2018-09-02 ENCOUNTER — Ambulatory Visit (INDEPENDENT_AMBULATORY_CARE_PROVIDER_SITE_OTHER): Payer: Medicare Other | Admitting: *Deleted

## 2018-09-02 ENCOUNTER — Other Ambulatory Visit: Payer: Self-pay

## 2018-09-02 DIAGNOSIS — I4891 Unspecified atrial fibrillation: Secondary | ICD-10-CM | POA: Diagnosis not present

## 2018-09-02 DIAGNOSIS — Z5181 Encounter for therapeutic drug level monitoring: Secondary | ICD-10-CM

## 2018-09-02 LAB — POCT INR: INR: 2.6 (ref 2.0–3.0)

## 2018-09-02 NOTE — Patient Instructions (Signed)
Continue coumadin 5mg  daily, except 10mg  on Mondays, Wednesdays and Fridays. Recheck INR in 4 weeks.  Pending excision of cyst.  May not need to come off coumadin.

## 2018-09-03 ENCOUNTER — Other Ambulatory Visit: Payer: Self-pay | Admitting: Cardiology

## 2018-09-10 ENCOUNTER — Other Ambulatory Visit: Payer: Self-pay | Admitting: Internal Medicine

## 2018-09-16 ENCOUNTER — Ambulatory Visit: Payer: Medicare Other | Admitting: Rheumatology

## 2018-09-20 ENCOUNTER — Other Ambulatory Visit: Payer: Self-pay | Admitting: Rheumatology

## 2018-09-20 MED ORDER — HYDROCODONE-ACETAMINOPHEN 5-325 MG PO TABS: 1.0000 | ORAL_TABLET | Freq: Two times a day (BID) | ORAL | 0 refills | Status: DC | PRN

## 2018-09-20 NOTE — Telephone Encounter (Signed)
Patient called requesting prescription refill of [facility] [address] [facility] [place], [place] 

## 2018-09-20 NOTE — Telephone Encounter (Signed)
Naomi for patient to update UDS and narcotic agreement at his appointment on 10/07/18.  Please make write a reminder in the appointment note.

## 2018-09-20 NOTE — Telephone Encounter (Signed)
Note made on appointment 

## 2018-09-20 NOTE — Telephone Encounter (Signed)
Last Visit: 03/16/18 Next visit: 10/07/18 UDS: 06/21/18 Narc Agreement: 06/21/18  Patient advised he is due to update UDS and narc agreement. Patient would like to update at appt on 10/07/18  Okay to refill Hydrocodone?

## 2018-09-28 NOTE — Progress Notes (Signed)
Office Visit Note  Patient: Jesus Rodriguez             Date of Birth: Nov 12, 1950           MRN: [id number]             PCP: Patient, [name] Referring: Macon Large, MD Visit Date: 10/07/2018 Occupation: @GUAROCC @  Subjective:  Pain in multiple joints   History of Present Illness: Jesus Rodriguez is a 68 y.o. male with history of osteoarthritis, seronegative rheumatoid arthritis, and DDD. He takes Hydrocodone 1 tablet BID as needed for pain relief.  He denies any recent rheumatoid arthritis flares.  He reports that he feels as though his rheumatoid arthritis continues to be in remission.  He states that he does have pain in multiple joints including bilateral shoulder joints, bilateral knee joints, and his lower back.  He denies any joint swelling.  He states he does have stiffness in both hands first thing in the morning.  He states his stiffness has been lasting longer up to 3 to 4 hours especially in his lower back.  He states that he previously was having intermittent pain in bilateral heels.  He states that he has not had an episode of heel pain in over 3 to 4 months.  He states he is apprehensive to start on allopurinol at this time.    Activities of Daily Living:  Patient reports morning stiffness for 3-4 hours.   Patient Reports nocturnal pain.  Difficulty dressing/grooming: Denies Difficulty climbing stairs: Reports Difficulty getting out of chair: Reports Difficulty using hands for taps, buttons, cutlery, and/or writing: Denies  Review of Systems  Constitutional: Positive for fatigue. Negative for night sweats.  HENT: Negative for mouth sores, mouth dryness and nose dryness.   Eyes: Negative for redness and dryness.  Respiratory: Negative for cough, hemoptysis, shortness of breath and difficulty breathing.   Cardiovascular: Negative for chest pain, palpitations, hypertension, irregular heartbeat and swelling in legs/feet.  Gastrointestinal: Negative for blood in stool,  constipation and diarrhea.  Endocrine: Negative for increased urination.  Genitourinary: Negative for painful urination.  Musculoskeletal: Positive for arthralgias, joint pain and morning stiffness. Negative for joint swelling, myalgias, muscle weakness, muscle tenderness and myalgias.  Skin: Negative for color change, rash, hair loss, nodules/bumps, skin tightness, ulcers and sensitivity to sunlight.  Allergic/Immunologic: Negative for susceptible to infections.  Neurological: Negative for dizziness, fainting, memory loss, night sweats and weakness.  Hematological: Negative for swollen glands.  Psychiatric/Behavioral: Negative for depressed mood and sleep disturbance. The patient is not nervous/anxious.     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   Hypertension 02/13/2016   Elevated cholesterol 02/13/2016   Insomnia 02/13/2016   Anxiety 02/13/2016   Sleep apnea 02/13/2016   Arrhythmia 02/13/2016    Past Medical History:  Diagnosis Date   Anxiety 02/13/2016   Atrial flutter ([place]) 02/13/2016   s/p CTI ablation at [facility]   DDD (degenerative disc disease), lumbar 02/13/2016   Elevated cholesterol 02/13/2016  Hypertension 02/13/2016   Insomnia 02/13/2016   Osteoarthritis of both hands 02/13/2016   Osteoarthritis of both knees 02/13/2016   Persistent atrial fibrillation    RA (rheumatoid arthritis) ([place]) 02/13/2016   Sero Negative   Scrotal  cancer ([place]) 02/13/2016   Sleep apnea 02/13/2016    Family History  Problem Relation Age of Onset   Hypertension Father    Heart attack Father    Cancer Father        prostate   Hypertension Sister    Hypertension Sister    Hypertension Sister    Epilepsy Son    Diabetes Son    Past Surgical History:  Procedure Laterality Date   A-FLUTTER ABLATION     at [facility] 2018   CARDIOVERSION  02/10/2018   CARDIOVERSION N/A 05/13/2018   Procedure: CARDIOVERSION;  Surgeon: Sanda Klein, MD;  [facility];  Service: Cardiovascular;  Laterality: N/A;   CATARACT EXTRACTION     Social History   Social History Narrative   Not on file   Immunization History  Administered Date(s) Administered   Influenza, High Dose Seasonal PF 05/14/2018     Objective: Vital Signs: BP 130/68 (BP Location: Left Wrist, Patient Position: Sitting, Cuff Size: Normal)    Pulse 73    Resp 18    Ht 6\' 1"  (1.854 m)    Wt (!) 392 lb (177.8 kg)    BMI 51.72 kg/m    Physical Exam Vitals signs and nursing note reviewed.  Constitutional:      Appearance: He is well-developed.  HENT:     Head: Normocephalic and atraumatic.  Eyes:     Conjunctiva/sclera: Conjunctivae normal.     Pupils: Pupils are equal, round, and reactive to light.  Neck:     Musculoskeletal: Normal range of motion and neck supple.  Cardiovascular:     Rate and Rhythm: Normal rate and regular rhythm.     Heart sounds: Normal heart sounds.  Pulmonary:     Effort: Pulmonary effort is normal.     Breath sounds: Normal breath sounds.  Abdominal:     General: Bowel sounds are normal.     Palpations: Abdomen is soft.  Skin:    General: Skin is warm and dry.     Capillary Refill: Capillary refill takes less than 2 seconds.  Neurological:     Mental Status: He is alert and oriented to person, place, and time.  Psychiatric:        Behavior: Behavior normal.      Musculoskeletal Exam: C-spine limited range of motion.   Thoracic kyphosis noted.  Difficult to assess lumbar range of motion while in wheelchair.  Shoulder joints have good range of motion.  Elbow joints, wrist joints, MCPs, PIPs, DIPs good range of motion no synovitis.  He has PIP and DIP synovial thickening consistent with osteoarthritis of bilateral hands.  He has complete fist formation bilaterally.  Hip joints difficult to assess in seated position.  Knee joints have good range of motion with no warmth or effusion.  He has pedal edema bilaterally.  CDAI Exam: CDAI Score: -- Patient Global: --; Provider Global: -- Swollen: --; Tender: -- Joint Exam   No joint exam has been documented for this visit   There is currently no information documented on the homunculus. Go to the Rheumatology activity and complete the homunculus joint exam.  Investigation: No additional findings.  Imaging: No results found.  Recent Labs: Lab Results  Component Value Date  NA 139 05/24/2018   K 4.1 05/24/2018   CL 98 05/24/2018   CO2 31 05/24/2018   GLUCOSE 125 (H) 05/24/2018   BUN 17 05/24/2018   CREATININE 0.79 05/24/2018   BILITOT 0.4 08/20/2016   ALKPHOS 86 08/20/2016   AST 15 08/20/2016   ALT 14 08/20/2016   PROT 7.0 08/20/2016   ALBUMIN 4.1 08/20/2016   CALCIUM 9.0 05/24/2018   GFRAA >60 05/24/2018    Speciality Comments: No specialty comments available.  Procedures:  No procedures performed Allergies: Celebrex [celecoxib], Methotrexate derivatives, and Sulfa antibiotics   Assessment / Plan:     Visit Diagnoses: Primary osteoarthritis of both hands -He has PIP and DIP synovial thickening consistent with osteoarthritis of bilateral hands.  He experiences stiffness in bilateral hands first thing in the morning.  He has no joint swelling or synovitis on exam today.  Joint protection and muscle strengthening were discussed.  He continues to take hydrocodone as prescribed.  UDS and narcotic agreement were updated today.  He was advised to notify  us if he develops increased joint pain or joint swelling. He will follow up in 6 months.   Primary osteoarthritis of both knees - Chronic pain.  He has good range of motion of bilateral knee joints.  No warmth or effusion was noted.  He has difficulty climbing steps and getting up from a chair due to discomfort experiences in his knee joints.  History of rheumatoid arthritis seronegative - In remission. -He has no synovitis on exam.  He has not had any recent rheumatoid arthritis flares.  His rheumatoid arthritis appears to still be in remission.  He has pain in bilateral shoulder joints, bilateral knee joints, and his lower back.  His discomfort is likely due to osteoarthritis.  He was advised to notify us if he develop signs or symptoms of rheumatoid throats flare.  DDD (degenerative disc disease), lumbar - Chronic pain.  He takes hydrocodone as prescribed for pain relief.   Hyperuricemia -Uric acid level was 8.3 on 03/17/19.  He experiences intermittent pain in bilateral heels.  He has no tenderness or inflammation on exam today.  He has not had any heel pain in over 3 to 4 months.  He has not had any symptoms of a gout flare recently.  He has never taken a urate lowering medication.  We briefly discussed the indications, contraindications, potential side effects of allopurinol.  He does not want to start on a urate lowering medication at this time.  We discussed dietary restrictions to follow.  He was given a handout of information today.  We will check a uric acid level, CBC, and CMP today.  If he develops signs or symptoms of a gout flare we will consider starting him on a urate lowering medication.  He will have several potential drug interactions.  We would need to reach out to his cardiologist for clearance if he requires a urate lowering medication.   Plan: Uric acid  Medication monitoring encounter - UDS and narcotic agreement were updated today on 10/07/18. Plan: Pain Mgmt, Profile 5 w/Conf, U,  COMPLETE METABOLIC PANEL WITH GFR, CBC with Differential/Platelet  Other medical conditions are listed as follows:  Scrotal cancer (HCC)   Cardiac arrhythmia, unspecified cardiac arrhythmia type   History of sleep apnea   History of depression   History of anxiety  History of insomnia   History of hypertension   History of hypercholesterolemia     Orders: Orders Placed This Encounter  Procedures  Pain Mgmt, Profile 5 w/Conf, U   Uric acid   COMPLETE METABOLIC PANEL WITH GFR   CBC with Differential/Platelet   No orders of the defined types were placed in this encounter.   Face-to-face time spent with patient was 30 minutes. Greater than 50% of time was spent in counseling and coordination of care.  Follow-Up Instructions: Return in about 6 months (around 04/08/2019) for Osteoarthritis, Rheumatoid arthritis.   Ofilia Neas, PA-C  Note - This record has been created using Dragon software.  Chart creation errors have been sought, but may not always  have been located. Such creation errors do not reflect on  the standard of medical care.

## 2018-10-04 ENCOUNTER — Ambulatory Visit (INDEPENDENT_AMBULATORY_CARE_PROVIDER_SITE_OTHER): Payer: Medicare Other | Admitting: *Deleted

## 2018-10-04 DIAGNOSIS — I4891 Unspecified atrial fibrillation: Secondary | ICD-10-CM

## 2018-10-04 DIAGNOSIS — Z5181 Encounter for therapeutic drug level monitoring: Secondary | ICD-10-CM | POA: Diagnosis not present

## 2018-10-04 LAB — POCT INR: INR: 2 (ref 2.0–3.0)

## 2018-10-04 NOTE — Patient Instructions (Signed)
Continue coumadin 5mg  daily, except 10mg  on Mondays, Wednesdays and Fridays. Recheck INR in 6 weeks.  Pending excision of cyst.  May not need to come off coumadin.

## 2018-10-06 ENCOUNTER — Telehealth (INDEPENDENT_AMBULATORY_CARE_PROVIDER_SITE_OTHER): Payer: Medicare Other | Admitting: Cardiology

## 2018-10-06 ENCOUNTER — Telehealth: Payer: Self-pay | Admitting: Cardiology

## 2018-10-06 ENCOUNTER — Encounter: Payer: Self-pay | Admitting: Cardiology

## 2018-10-06 VITALS — Ht 73.0 in | Wt 389.0 lb

## 2018-10-06 DIAGNOSIS — I5032 Chronic diastolic (congestive) heart failure: Secondary | ICD-10-CM

## 2018-10-06 DIAGNOSIS — I4891 Unspecified atrial fibrillation: Secondary | ICD-10-CM | POA: Diagnosis not present

## 2018-10-06 NOTE — Patient Instructions (Signed)
Your physician recommends that you schedule a follow-up appointment in: [place]  Your physician recommends that you continue on your current medications as directed. Please refer to the Current Medication list given to you today.  Your physician has requested that you have an echocardiogram. Echocardiography is a painless test that uses sound waves to create images of your heart. It provides your doctor with information about the size and shape of your heart and how well your heart's chambers and valves are working. This procedure takes approximately one hour. There are no restrictions for this procedure.  Your physician recommends that you return for lab work BMP/BNP/MG [place] DO NOT NEED TO BE FASTING   Thank you for choosing [facility]!!

## 2018-10-06 NOTE — Progress Notes (Signed)
Virtual Visit via Telephone Note   This visit type was conducted due to national recommendations for restrictions regarding the COVID-19 Pandemic (e.g. social distancing) in an effort to limit this patient's exposure and mitigate transmission in our community.  Due to his co-morbid illnesses, this patient is at least at moderate risk for complications without adequate follow up.  This format is felt to be most appropriate for this patient at this time.  The patient did not have access to video technology/had technical difficulties with video requiring transitioning to audio format only (telephone).  All issues noted in this document were discussed and addressed.  No physical exam could be performed with this format.  Please refer to the patient's chart for his  consent to telehealth for [facility].   Date:  10/06/2018   ID:  [name], DOB Jun 25, 1950, MRN [id number]  Patient Location: Home Provider Location: Office  PCP:  Patient, [name]  Cardiologist:  Carlyle Dolly, MD  Electrophysiologist:  Thompson Grayer, MD   Evaluation Performed:  Follow-Up Visit  Chief Complaint:  2 month follow up  History of Present Illness:    Jesus Rodriguez is a 68 y.o. male seen today for follow up of the following medical problems.     1. Afib and flutter - notes indicate history of typical flutter s/p CTI ablation 03/2016 - history of afibPreiviously followed by [facility] - failed sotalol, started on dofetilide, followed by EP Dr Rayann Heman   - no recent palpitations - no bleeding on coumadin   2.Chronic diastolic HF - weights stable around 388 lbs.  - no recent edema.  - compliant with meds, torsemide 40mg  bid - some orthopnea - has some DOE, in general exertion limited by chronic hip pain.     The patient does not have symptoms concerning for COVID-19 infection (fever, chills, cough, or new shortness of breath).    Past Medical History:  Diagnosis Date  . Anxiety  02/13/2016  . Atrial flutter ([place]) 02/13/2016   s/p [facility] ablation at [facility]  . DDD (degenerative disc disease), lumbar 02/13/2016  . Elevated cholesterol 02/13/2016  . Hypertension 02/13/2016  . Insomnia 02/13/2016  . Osteoarthritis of both hands 02/13/2016  . Osteoarthritis of both knees 02/13/2016  . Persistent atrial fibrillation   . RA (rheumatoid arthritis) ([place]) 02/13/2016   Sero Negative  . 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 Sleep apnea 02/13/2016   Past Surgical History:  Procedure Laterality Date  . A-FLUTTER ABLATION     at [facility] 2018  . CARDIOVERSION  02/10/2018  . CARDIOVERSION N/A 05/13/2018   Procedure: CARDIOVERSION;  Surgeon: Sanda Klein, MD;  [facility];  Service: Cardiovascular;  Laterality: N/A;  . CATARACT EXTRACTION       No outpatient medications have been marked as taking for the 10/06/18 encounter (Appointment) with Arnoldo Lenis, MD.     Allergies:   Celebrex [celecoxib], Methotrexate derivatives, and Sulfa antibiotics   Social History   Tobacco Use  . Smoking status: Current Every Day Smoker    Packs/day: 2.00    Years: 35.00    Pack years: 70.00    Types: Cigarettes  . Smokeless tobacco: Never Used  Substance Use Topics  . Alcohol use: No  . Drug use: No     Family Hx: The patient's family history includes Cancer in his father; Diabetes in his son; Epilepsy in his son; Heart attack in his father; Hypertension in his father, sister, sister, and sister.  ROS:  Please see the history of present illness.     All other systems reviewed and are negative.   Prior CV studies:   The following studies were reviewed today:  12/2017 echo Dr Cammy Copa office LVEF 70%, no significant pathology  Labs/Other Tests and Data Reviewed:    EKG:  No ECG reviewed.  Recent Labs: 04/16/2018: TSH 1.56 05/24/2018: BUN 17; Creatinine, Ser 0.79; Magnesium 2.4; Potassium 4.1; Sodium 139   Recent Lipid Panel No results found for: CHOL, TRIG, HDL,  CHOLHDL, LDLCALC, LDLDIRECT  Wt Readings from Last 3 Encounters:  07/21/18 (!) 388 lb (176 kg)  06/18/18 (!) 394 lb (178.7 kg)  05/24/18 (!) 388 lb (176 kg)     Objective:    Vital Signs:   Today's Vitals   10/06/18 1101  Weight: (!) 389 lb (176.4 kg)  Height: 6\' 1"  (1.854 m)   Body mass index is 51.32 kg/m.  Normal affect. Normal speech pattern and tone. No audible signs of SOB or wheezing. Comfortable, no apparent distress  ASSESSMENT & PLAN:    1. Aflutter/Afib - history of aflutter ablation. Also with afib previously followed by [facility] - failed sotalol,  started on dofetilide. Mainting SR.   - no recent symptoms.continue current meds, check BMET/Mg   2. Chronic diastolic HF -initial weight loss on torsemide that has leveled off and stabilized at 388 lbs - check BMET/Mg/BNP - obtain echo given recent worsening of HF symptoms.   COVID-19 Education: The signs and symptoms of COVID-19 were discussed with the patient and how to seek care for testing (follow up with PCP or arrange E-visit).  The importance of social distancing was discussed today.  Time:   Today, I have spent 15 minutes with the patient with telehealth technology discussing the above problems.     Medication Adjustments/Labs and Tests Ordered: Current medicines are reviewed at length with the patient today.  Concerns regarding medicines are outlined above.   Tests Ordered: No orders of the defined types were placed in this encounter.   Medication Changes: No orders of the defined types were placed in this encounter.   Follow Up:  In Person in 3 month(s)  Signed, Carlyle Dolly, MD  10/06/2018 8:34 AM    [place]

## 2018-10-06 NOTE — Telephone Encounter (Signed)
°  Precert needed for: Echo   Location: [facility]   Date: November 04, 2018

## 2018-10-06 NOTE — Addendum Note (Signed)
Addended by: Julian Hy T on: 10/06/2018 11:28 AM   Modules accepted: Orders

## 2018-10-07 ENCOUNTER — Encounter: Payer: Self-pay | Admitting: Physician Assistant

## 2018-10-07 ENCOUNTER — Other Ambulatory Visit: Payer: Self-pay

## 2018-10-07 ENCOUNTER — Ambulatory Visit (INDEPENDENT_AMBULATORY_CARE_PROVIDER_SITE_OTHER): Payer: Medicare Other | Admitting: Physician Assistant

## 2018-10-07 ENCOUNTER — Other Ambulatory Visit: Payer: Self-pay | Admitting: Internal Medicine

## 2018-10-07 VITALS — BP 130/68 | HR 73 | Resp 18 | Ht 73.0 in | Wt 392.0 lb

## 2018-10-07 DIAGNOSIS — M170 Bilateral primary osteoarthritis of knee: Secondary | ICD-10-CM | POA: Diagnosis not present

## 2018-10-07 DIAGNOSIS — M19042 Primary osteoarthritis, left hand: Secondary | ICD-10-CM

## 2018-10-07 DIAGNOSIS — Z8659 Personal history of other mental and behavioral disorders: Secondary | ICD-10-CM

## 2018-10-07 DIAGNOSIS — I499 Cardiac arrhythmia, unspecified: Secondary | ICD-10-CM

## 2018-10-07 DIAGNOSIS — M5136 Other intervertebral disc degeneration, lumbar region: Secondary | ICD-10-CM | POA: Diagnosis not present

## 2018-10-07 DIAGNOSIS — Z8639 Personal history of other endocrine, nutritional and metabolic disease: Secondary | ICD-10-CM

## 2018-10-07 DIAGNOSIS — Z8669 Personal history of other diseases of the nervous system and sense organs: Secondary | ICD-10-CM

## 2018-10-07 DIAGNOSIS — M19041 Primary osteoarthritis, right hand: Secondary | ICD-10-CM

## 2018-10-07 DIAGNOSIS — E790 Hyperuricemia without signs of inflammatory arthritis and tophaceous disease: Secondary | ICD-10-CM

## 2018-10-07 DIAGNOSIS — C632 Malignant neoplasm of scrotum: Secondary | ICD-10-CM

## 2018-10-07 DIAGNOSIS — Z8739 Personal history of other diseases of the musculoskeletal system and connective tissue: Secondary | ICD-10-CM | POA: Diagnosis not present

## 2018-10-07 DIAGNOSIS — Z5181 Encounter for therapeutic drug level monitoring: Secondary | ICD-10-CM

## 2018-10-07 DIAGNOSIS — Z87898 Personal history of other specified conditions: Secondary | ICD-10-CM

## 2018-10-07 DIAGNOSIS — Z8679 Personal history of other diseases of the circulatory system: Secondary | ICD-10-CM

## 2018-10-07 NOTE — Progress Notes (Signed)
Pharmacy Note   Subjective:  Patient presents today to the [facility] to see Dr. Estanislado Pandy.  Patient was prescribed allopurinol for gout.  Patient was seen by the pharmacist for counseling on allopurniol and colchicine.   Objective: CBC No results found for: WBC, RBC, HGB, HCT, PLT, MCV, MCH, MCHC, RDW, LYMPHSABS, MONOABS, EOSABS, BASOSABS  CMP     Component Value Date/Time   NA 139 05/24/2018 1318   K 4.1 05/24/2018 1318   CL 98 05/24/2018 1318   CO2 31 05/24/2018 1318   GLUCOSE 125 (H) 05/24/2018 1318   BUN 17 05/24/2018 1318   CREATININE 0.79 05/24/2018 1318   CREATININE 0.92 04/16/2018 1313   CALCIUM 9.0 05/24/2018 1318   PROT 7.0 08/20/2016 1343   ALBUMIN 4.1 08/20/2016 1343   AST 15 08/20/2016 1343   ALT 14 08/20/2016 1343   ALKPHOS 86 08/20/2016 1343   BILITOT 0.4 08/20/2016 1343   GFRNONAA >60 05/24/2018 1318   GFRNONAA 83 08/20/2016 1343   GFRAA >60 05/24/2018 1318   GFRAA >89 08/20/2016 1343    Uric Acid Lab Results  Component Value Date   LABURIC 8.3 (H) 03/16/2018      Assessment/Plan:  Counseled patient on the purpose proper use, and adverse effects of allopurinol and colchicine.  Discussed the importance of taking allopurniol every day to lower uric acid levels.  The possibility of recurrent gout while lowering the uric acid was explained and discussed the importance of taking colchicine daily at this time.  Provided patient with medication education material and answered all questions.    He is not having an issue with gout at this time.  He chooses to incorporate diet and lifestyle modifications at this time.  Discussed the need of starting allopurinol and Colcrys depending on his uric acid level. We would need to coordinate with other providers as allopurinol interacts with warfarin and will require more frequent monitoring during initiation. Colcrys also interacts with simvastatin increasing risk of rhabdomyolysis but alternative therapy for  gout attacks include NSAID's which can increase INR.  Patient verbalized understanding.  All questions encouraged and answered.  Instructed patient to call with any further questions or concerns.  Mariella Saa, PharmD, [facility] Clinical Pharmacist  10/07/2018 2:00 PM

## 2018-10-07 NOTE — Telephone Encounter (Signed)
Warfarin refill sent in.

## 2018-10-08 NOTE — Progress Notes (Signed)
Labs are stable.  Uric acid is elevated but stable at 8.3. Dietary modifications were discussed yesterday. He has not had any recent signs or symptoms of a gout flare.  He will notify us if he would like to start on Allopurinol.

## 2018-10-09 LAB — COMPLETE METABOLIC PANEL WITH GFR
AG Ratio: 1.3 (calc) (ref 1.0–2.5)
ALT: 13 U/L (ref 9–46)
AST: 13 U/L (ref 10–35)
Albumin: 3.9 g/dL (ref 3.6–5.1)
Alkaline phosphatase (APISO): 74 U/L (ref 35–144)
BUN/Creatinine Ratio: 27 (calc) — ABNORMAL HIGH (ref 6–22)
BUN: 18 mg/dL (ref 7–25)
CO2: 33 mmol/L — ABNORMAL HIGH (ref 20–32)
Calcium: 9.3 mg/dL (ref 8.6–10.3)
Chloride: 97 mmol/L — ABNORMAL LOW (ref 98–110)
Creat: 0.66 mg/dL — ABNORMAL LOW (ref 0.70–1.25)
GFR, Est African American: 115 mL/min/{1.73_m2} (ref >=60)
GFR, Est Non African American: 99 mL/min/{1.73_m2} (ref >=60)
Globulin: 3 g/dL (calc) (ref 1.9–3.7)
Glucose, Bld: 115 mg/dL — ABNORMAL HIGH (ref 65–99)
Potassium: 4.2 mmol/L (ref 3.5–5.3)
Sodium: 140 mmol/L (ref 135–146)
Total Bilirubin: 0.5 mg/dL (ref 0.2–1.2)
Total Protein: 6.9 g/dL (ref 6.1–8.1)

## 2018-10-09 LAB — PAIN MGMT, PROFILE 5 W/CONF, U
Amphetamines: NEGATIVE ng/mL
Barbiturates: NEGATIVE ng/mL
Benzodiazepines: NEGATIVE ng/mL
Cocaine Metabolite: NEGATIVE ng/mL
Codeine: NEGATIVE ng/mL
Creatinine: 193.6 mg/dL
Hydrocodone: 331 ng/mL
Hydromorphone: 208 ng/mL
Marijuana Metabolite: NEGATIVE ng/mL
Methadone Metabolite: NEGATIVE ng/mL
Morphine: NEGATIVE ng/mL
Norhydrocodone: 570 ng/mL
Opiates: POSITIVE ng/mL
Oxidant: NEGATIVE ug/mL
Oxycodone: NEGATIVE ng/mL
pH: 7.3 (ref 4.5–9.0)

## 2018-10-09 LAB — CBC WITH DIFFERENTIAL/PLATELET
Absolute Monocytes: 569 cells/uL (ref 200–950)
Basophils Absolute: 78 cells/uL (ref 0–200)
Basophils Relative: 1 %
Eosinophils Absolute: 203 cells/uL (ref 15–500)
Eosinophils Relative: 2.6 %
HCT: 44.9 % (ref 38.5–50.0)
Hemoglobin: 14.8 g/dL (ref 13.2–17.1)
Lymphs Abs: 1131 cells/uL (ref 850–3900)
MCH: 29.5 pg (ref 27.0–33.0)
MCHC: 33 g/dL (ref 32.0–36.0)
MCV: 89.6 fL (ref 80.0–100.0)
MPV: 8.9 fL (ref 7.5–12.5)
Monocytes Relative: 7.3 %
Neutro Abs: 5819 cells/uL (ref 1500–7800)
Neutrophils Relative %: 74.6 %
Platelets: 240 10*3/uL (ref 140–400)
RBC: 5.01 10*6/uL (ref 4.20–5.80)
RDW: 12.9 % (ref 11.0–15.0)
Total Lymphocyte: 14.5 %
WBC: 7.8 10*3/uL (ref 3.8–10.8)

## 2018-10-09 LAB — URIC ACID: Uric Acid, Serum: 8.3 mg/dL — ABNORMAL HIGH (ref 4.0–8.0)

## 2018-10-11 NOTE — Progress Notes (Signed)
UDS is consistent with treatment.

## 2018-10-13 ENCOUNTER — Telehealth: Payer: Self-pay | Admitting: *Deleted

## 2018-10-13 NOTE — Telephone Encounter (Signed)
Patient states when he was in the office for his appointment he had discussed a prescription for a shower chair with Lovena Le. It is not mentioned in the office. Okay to send patient a prescription for shower chair?

## 2018-10-13 NOTE — Telephone Encounter (Signed)
Okay to give prescription for shower chair

## 2018-10-14 NOTE — Telephone Encounter (Signed)
Prescription for shower chair mailed to patient

## 2018-10-20 ENCOUNTER — Telehealth: Payer: Self-pay | Admitting: Rheumatology

## 2018-10-20 MED ORDER — HYDROCODONE-ACETAMINOPHEN 5-325 MG PO TABS: 1.0000 | ORAL_TABLET | Freq: Two times a day (BID) | ORAL | 0 refills | Status: DC | PRN

## 2018-10-20 NOTE — Telephone Encounter (Signed)
Patient called requesting prescription refill of [facility] [address] [facility] [place], [place] 

## 2018-10-20 NOTE — Telephone Encounter (Signed)
Last Visit: 10/07/18 Next Visit: 03/29/19 UDS:10/07/18 Narc Agreement: 10/07/18  Okay to refill Hydrocodone?

## 2018-11-03 ENCOUNTER — Telehealth: Payer: Self-pay | Admitting: Cardiology

## 2018-11-03 NOTE — Telephone Encounter (Signed)

## 2018-11-04 ENCOUNTER — Ambulatory Visit (INDEPENDENT_AMBULATORY_CARE_PROVIDER_SITE_OTHER): Payer: Medicare Other

## 2018-11-04 ENCOUNTER — Other Ambulatory Visit: Payer: Self-pay

## 2018-11-04 DIAGNOSIS — I5032 Chronic diastolic (congestive) heart failure: Secondary | ICD-10-CM

## 2018-11-05 ENCOUNTER — Other Ambulatory Visit: Payer: Self-pay | Admitting: Physician Assistant

## 2018-11-08 ENCOUNTER — Telehealth: Payer: Self-pay | Admitting: *Deleted

## 2018-11-08 NOTE — Telephone Encounter (Signed)
Patient informed. 

## 2018-11-08 NOTE — Telephone Encounter (Signed)
-----   Message from Arnoldo Lenis, MD sent at 11/05/2018 10:48 AM EDT ----- Echo overall looks good, overall heart function is good   Zandra Abts MD

## 2018-11-12 ENCOUNTER — Telehealth: Payer: Self-pay | Admitting: *Deleted

## 2018-11-12 NOTE — Telephone Encounter (Signed)
Pt aware - routed to pcp  

## 2018-11-12 NOTE — Telephone Encounter (Signed)
-----   Message from Arnoldo Lenis, MD sent at 11/12/2018  1:01 PM EDT ----- Labs look good   Zandra Abts MD

## 2018-11-17 ENCOUNTER — Ambulatory Visit (INDEPENDENT_AMBULATORY_CARE_PROVIDER_SITE_OTHER): Payer: Medicare Other | Admitting: *Deleted

## 2018-11-17 ENCOUNTER — Other Ambulatory Visit: Payer: Self-pay

## 2018-11-17 DIAGNOSIS — I4891 Unspecified atrial fibrillation: Secondary | ICD-10-CM | POA: Diagnosis not present

## 2018-11-17 DIAGNOSIS — Z5181 Encounter for therapeutic drug level monitoring: Secondary | ICD-10-CM

## 2018-11-17 LAB — POCT INR: INR: 2.3 (ref 2.0–3.0)

## 2018-11-17 NOTE — Patient Instructions (Signed)
Continue coumadin 5mg  daily, except 10mg  on Mondays, Wednesdays and Fridays. Recheck INR in 6 weeks.  Pending excision of cyst 11/18/18. Does not need to come off coumadin per Dr Tarri Glenn.

## 2018-11-19 ENCOUNTER — Telehealth: Payer: Self-pay | Admitting: Rheumatology

## 2018-11-19 MED ORDER — HYDROCODONE-ACETAMINOPHEN 5-325 MG PO TABS: 1.0000 | ORAL_TABLET | Freq: Two times a day (BID) | ORAL | 0 refills | Status: DC | PRN

## 2018-11-19 NOTE — Telephone Encounter (Signed)
Last Visit: 10/07/18 Next Visit: 03/29/19 UDS:10/07/18 Narc Agreement: 10/07/18  Last Fill: 10/20/18  Okay to refill Hydrocodone?

## 2018-11-19 NOTE — Telephone Encounter (Signed)
Patient request a refill on [facility] [address]

## 2018-12-17 ENCOUNTER — Ambulatory Visit: Payer: Medicare Other | Admitting: Internal Medicine

## 2018-12-21 ENCOUNTER — Telehealth: Payer: Self-pay | Admitting: Rheumatology

## 2018-12-21 MED ORDER — HYDROCODONE-ACETAMINOPHEN 5-325 MG PO TABS: 1.0000 | ORAL_TABLET | Freq: Two times a day (BID) | ORAL | 0 refills | Status: DC | PRN

## 2018-12-21 NOTE — Telephone Encounter (Signed)
Last Visit: 10/07/18 Next Visit: 03/29/19 UDS:10/07/18 Narc Agreement: 10/07/18  Last Fill: 11/19/18   Patient will update UDS at the end of this month.   Okay to refill Hydrocodone?

## 2018-12-21 NOTE — Telephone Encounter (Signed)
Patient called requesting prescription refill of [facility] [address] in [place], [place].    Patient also requested a return call to let him know when he is due for urine screening.

## 2018-12-29 ENCOUNTER — Other Ambulatory Visit: Payer: Self-pay

## 2018-12-29 ENCOUNTER — Ambulatory Visit (INDEPENDENT_AMBULATORY_CARE_PROVIDER_SITE_OTHER): Payer: Medicare Other | Admitting: *Deleted

## 2018-12-29 DIAGNOSIS — Z5181 Encounter for therapeutic drug level monitoring: Secondary | ICD-10-CM | POA: Diagnosis not present

## 2018-12-29 DIAGNOSIS — I4891 Unspecified atrial fibrillation: Secondary | ICD-10-CM | POA: Diagnosis not present

## 2018-12-29 LAB — POCT INR: INR: 1.9 — AB (ref 2.0–3.0)

## 2018-12-29 MED ORDER — BENAZEPRIL HCL 20 MG PO TABS: 20.0000 mg | ORAL_TABLET | Freq: Every day | ORAL | 3 refills | Status: DC

## 2018-12-29 MED ORDER — SIMVASTATIN 20 MG PO TABS: 20.0000 mg | ORAL_TABLET | Freq: Every day | ORAL | 3 refills | Status: DC

## 2018-12-29 NOTE — Patient Instructions (Signed)
Take 1 1/2 tablets today then resume 5mg  daily except 10mg  on Mondays, Wednesdays and Fridays. Recheck INR in 6 weeks.  Pending excision of cyst 11/18/18. Does not need to come off coumadin per Dr Tarri Glenn.

## 2019-01-03 ENCOUNTER — Other Ambulatory Visit: Payer: Self-pay | Admitting: Cardiology

## 2019-01-04 ENCOUNTER — Other Ambulatory Visit: Payer: Self-pay

## 2019-01-04 ENCOUNTER — Ambulatory Visit (INDEPENDENT_AMBULATORY_CARE_PROVIDER_SITE_OTHER): Payer: Medicare Other | Admitting: Cardiology

## 2019-01-04 ENCOUNTER — Encounter: Payer: Self-pay | Admitting: Cardiology

## 2019-01-04 VITALS — BP 126/72 | HR 96 | Ht 73.0 in | Wt 399.2 lb

## 2019-01-04 DIAGNOSIS — I4891 Unspecified atrial fibrillation: Secondary | ICD-10-CM | POA: Diagnosis not present

## 2019-01-04 DIAGNOSIS — Z23 Encounter for immunization: Secondary | ICD-10-CM

## 2019-01-04 DIAGNOSIS — I5032 Chronic diastolic (congestive) heart failure: Secondary | ICD-10-CM

## 2019-01-04 MED ORDER — TORSEMIDE 20 MG PO TABS: 40.0000 mg | ORAL_TABLET | Freq: Two times a day (BID) | ORAL | 0 refills | Status: DC

## 2019-01-04 MED ORDER — SILDENAFIL CITRATE 100 MG PO TABS: ORAL_TABLET | ORAL | 6 refills | Status: DC

## 2019-01-04 NOTE — Progress Notes (Signed)
Clinical Summary Mr. Jesus Rodriguez is a 68 y.o.male seen today for follow up of the following medical problems.     1. Afib and flutter - notes indicate history of typical flutter s/p CTI ablation 03/2016 - history of afibPreiviously followed by [facility] - failed sotalol, started on dofetilide, followed by EP Dr Rayann Heman   - no recent palpitatins, tolerating coumdain without trouble.s    2.Chronic diastolic HF -0000000 echo LVEF 55-60%,  - weights by our scale 399, he reports his home scale 392 lbs Baseline is around 388 lbs. He lowered his torsemide to 40mg  just once daily recently due to lack of swelling. Stable SOb unchanged, has had some increased LE edema.     Past Medical History:  Diagnosis Date  . Anxiety 02/13/2016  . Atrial flutter ([place]) 02/13/2016   s/p CTI ablation at [facility]  . DDD (degenerative disc disease), lumbar 02/13/2016  . Elevated cholesterol 02/13/2016  . Hypertension 02/13/2016  . Insomnia 02/13/2016  . Osteoarthritis of both hands 02/13/2016  . Osteoarthritis of both knees 02/13/2016  . Persistent atrial fibrillation   . RA (rheumatoid arthritis) ([place]) 02/13/2016   Sero Negative  . 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 Sleep apnea 02/13/2016     Allergies  Allergen Reactions  . Celebrex [Celecoxib]   . Methotrexate Derivatives Other (See Comments)    Arrhythmia   . Sulfa Antibiotics      Current Outpatient Medications  Medication Sig Dispense Refill  . acetaminophen (TYLENOL) 500 MG tablet Take by mouth as needed.     . benazepril (LOTENSIN) 20 MG tablet Take 1 tablet (20 mg total) by mouth daily. 90 tablet 3  . dofetilide (TIKOSYN) 500 MCG capsule TAKE 1 CAPSULE (500 MCG TOTAL) BY MOUTH 2 (TWO) TIMES DAILY. 180 capsule 0  . doxepin (SINEQUAN) 25 MG capsule Take 25 mg by mouth as needed.     . doxycycline (VIBRAMYCIN) 100 MG capsule Take 100 mg by mouth daily.     Marland Kitchen HYDROcodone-acetaminophen (NORCO/VICODIN) 5-325 MG tablet Take 1 tablet by mouth 2  (two) times daily as needed for moderate pain. 60 tablet 0  . metoprolol tartrate (LOPRESSOR) 25 MG tablet Take 1 tablet (25 mg total) by mouth 2 (two) times daily. 180 tablet 3  . simvastatin (ZOCOR) 20 MG tablet Take 1 tablet (20 mg total) by mouth daily at 6 PM. 30 tablet 3  . tamsulosin (FLOMAX) 0.4 MG CAPS capsule Take by mouth daily.    Marland Kitchen tiotropium (SPIRIVA) 18 MCG inhalation capsule Place 18 mcg into inhaler and inhale daily.    Marland Kitchen torsemide (DEMADEX) 20 MG tablet TAKE 2 TABLETS BY MOUTH TWICE A DAY 360 tablet 0  . warfarin (COUMADIN) 10 MG tablet TAKE 1/2 TO 1 TABLET DAILY AS DIRECTED BY COUMADIN CLINIC. (Patient taking differently: Take 1/2 to 1 tablet daily as directed by coumadin clinic. Lattie Haw)) 90 tablet 1   No current facility-administered medications for this visit.      Past Surgical History:  Procedure Laterality Date  . A-FLUTTER ABLATION     at [facility] 2018  . CARDIOVERSION  02/10/2018  . CARDIOVERSION N/A 05/13/2018   Procedure: CARDIOVERSION;  Surgeon: Sanda Klein, MD;  Location: [facility];  Service: Cardiovascular;  Laterality: N/A;  . CATARACT EXTRACTION       Allergies  Allergen Reactions  . Celebrex [Celecoxib]   . Methotrexate Derivatives Other (See Comments)    Arrhythmia   . Sulfa Antibiotics  Family History  Problem Relation Age of Onset  . Hypertension Father   . Heart attack Father   . Cancer Father        prostate  . Hypertension Sister   . Hypertension Sister   . Hypertension Sister   . Epilepsy Son   . Diabetes Son      Social History Mr. Jesus Rodriguez reports that he has been smoking cigarettes. He has a 70.00 pack-year smoking history. He has never used smokeless tobacco. Mr. Jesus Rodriguez reports no history of alcohol use.   Review of Systems CONSTITUTIONAL: No weight loss, fever, chills, weakness or fatigue.  HEENT: Eyes: No visual loss, blurred vision, double vision or yellow sclerae.No hearing loss, sneezing, congestion, runny  nose or sore throat.  SKIN: No rash or itching.  CARDIOVASCULAR: per hpi RESPIRATORY: per hpi GASTROINTESTINAL: No anorexia, nausea, vomiting or diarrhea. No abdominal pain or blood.  GENITOURINARY: No burning on urination, no polyuria NEUROLOGICAL: No headache, dizziness, syncope, paralysis, ataxia, numbness or tingling in the extremities. No change in bowel or bladder control.  MUSCULOSKELETAL: No muscle, back pain, joint pain or stiffness.  LYMPHATICS: No enlarged nodes. No history of splenectomy.  PSYCHIATRIC: No history of depression or anxiety.  ENDOCRINOLOGIC: No reports of sweating, cold or heat intolerance. No polyuria or polydipsia.  Marland Kitchen   Physical Examination Vitals:   01/04/19 1534  BP: 126/72  Pulse: 96  SpO2: 95%   Filed Weights   01/04/19 1534  Weight: (!) 399 lb 3.2 oz (181.1 kg)    Gen: resting comfortably, no acute distress HEENT: no scleral icterus, pupils equal round and reactive, no palptable cervical adenopathy,  CV: RRR no m/r/g no jvd Resp: Clear to auscultation bilaterally GI: abdomen is soft, non-tender, non-distended, normal bowel sounds, no hepatosplenomegaly MSK: extremities are warm, trace bilateral edema Skin: warm, no rash Neuro:  no focal deficits Psych: appropriate affect   Diagnostic Studies   10/2018 echo  1. Images are limited.  2. The left ventricle has grossly normal systolic function, with an ejection fraction of 55-60%. The cavity size was normal. There is mildly increased left ventricular wall thickness. Left ventricular diastolic Doppler parameters are indeterminate.  3. The right ventricle has normal systolic function. The cavity was normal. There is no increase in right ventricular wall thickness. Right ventricular systolic pressure is normal with an estimated pressure of 12.5 mmHg.  4. The aortic valve is tricuspid. Mild calcification of the aortic valve. Mild aortic annular calcification noted.  5. The mitral valve is grossly  normal. There is mild mitral annular calcification present.  6. The tricuspid valve is grossly normal.  7. The aorta is abnormal in size and structure.  8. There is mild dilatation of the aortic root.  9. The interatrial septum was not well visualized.  Assessment and Plan   1. Aflutter/Afib - history of aflutter ablation. Also with afib previously followed by [facility] - failed sotalol,  started on dofetilide.  EKG today shows rate controlled afib, he is asymptomatic. Monitor at this time, if persistent may have to consider alternative management options.    2.Chronic diastolic HF -some weight gain and edema, on his own he had lowered his torsemide to 40mg  daily. He will go back to 40mg  bid until fluid has normalized    Given a rx for prn viagra    F/u 6 months     Arnoldo Lenis, M.D.

## 2019-01-04 NOTE — Patient Instructions (Signed)
Your physician wants you to follow-up in: [place] will receive a reminder letter in the mail two months in advance. If you don't receive a letter, please call our office to schedule the follow-up appointment.  Your physician has recommended you make the following change in your medication:   START VIAGRA 100 MG TAKE 1/2 TABLET 30 MINS PRIOR   Thank you for choosing [facility]!!

## 2019-01-12 ENCOUNTER — Other Ambulatory Visit: Payer: Self-pay | Admitting: *Deleted

## 2019-01-12 DIAGNOSIS — G8929 Other chronic pain: Secondary | ICD-10-CM

## 2019-01-12 DIAGNOSIS — Z5181 Encounter for therapeutic drug level monitoring: Secondary | ICD-10-CM

## 2019-01-14 LAB — PAIN MGMT, PROFILE 5 W/CONF, U
Amphetamines: NEGATIVE ng/mL
Barbiturates: NEGATIVE ng/mL
Benzodiazepines: NEGATIVE ng/mL
Cocaine Metabolite: NEGATIVE ng/mL
Codeine: NEGATIVE ng/mL
Creatinine: 206 mg/dL
Hydrocodone: 1259 ng/mL
Hydromorphone: 236 ng/mL
Marijuana Metabolite: NEGATIVE ng/mL
Methadone Metabolite: NEGATIVE ng/mL
Morphine: NEGATIVE ng/mL
Norhydrocodone: 1121 ng/mL
Opiates: POSITIVE ng/mL
Oxidant: NEGATIVE ug/mL
Oxycodone: NEGATIVE ng/mL
pH: 5.5 (ref 4.5–9.0)

## 2019-01-17 NOTE — Progress Notes (Signed)
C/w

## 2019-01-20 ENCOUNTER — Telehealth: Payer: Self-pay | Admitting: Rheumatology

## 2019-01-20 MED ORDER — HYDROCODONE-ACETAMINOPHEN 5-325 MG PO TABS: 1.0000 | ORAL_TABLET | Freq: Two times a day (BID) | ORAL | 0 refills | Status: DC | PRN

## 2019-01-20 NOTE — Telephone Encounter (Signed)
Patient called requesting prescription refill of [facility] [address] [facility] [place], [place] 

## 2019-01-20 NOTE — Telephone Encounter (Signed)
Last Visit: 10/07/18 Next Visit: 03/29/19 UDS:01/12/19 Narc Agreement: 01/12/19  Okay to refill Hydrocodone?

## 2019-01-31 ENCOUNTER — Other Ambulatory Visit: Payer: Self-pay | Admitting: Internal Medicine

## 2019-02-17 ENCOUNTER — Other Ambulatory Visit: Payer: Self-pay

## 2019-02-17 ENCOUNTER — Ambulatory Visit (INDEPENDENT_AMBULATORY_CARE_PROVIDER_SITE_OTHER): Payer: Medicare Other | Admitting: *Deleted

## 2019-02-17 DIAGNOSIS — Z5181 Encounter for therapeutic drug level monitoring: Secondary | ICD-10-CM | POA: Diagnosis not present

## 2019-02-17 DIAGNOSIS — I4891 Unspecified atrial fibrillation: Secondary | ICD-10-CM | POA: Diagnosis not present

## 2019-02-17 LAB — POCT INR: INR: 2.2 (ref 2.0–3.0)

## 2019-02-17 NOTE — Patient Instructions (Signed)
Continue warfarin 5mg  daily except 10mg  on Mondays, Wednesdays and Fridays. Recheck INR in 6 weeks.

## 2019-02-21 ENCOUNTER — Telehealth: Payer: Self-pay | Admitting: Rheumatology

## 2019-02-21 MED ORDER — HYDROCODONE-ACETAMINOPHEN 5-325 MG PO TABS: 1.0000 | ORAL_TABLET | Freq: Two times a day (BID) | ORAL | 0 refills | Status: DC | PRN

## 2019-02-21 NOTE — Telephone Encounter (Signed)
Last Visit: 10/07/18 Next Visit: 03/29/19 UDS:01/12/19 Narc Agreement: 01/12/19   Okay to refill Hydrocodone?

## 2019-02-21 NOTE — Telephone Encounter (Signed)
Patient called requesting prescription refill of [facility] [address] in [place]. 

## 2019-03-09 ENCOUNTER — Telehealth (INDEPENDENT_AMBULATORY_CARE_PROVIDER_SITE_OTHER): Payer: Medicare Other | Admitting: Internal Medicine

## 2019-03-09 ENCOUNTER — Encounter: Payer: Self-pay | Admitting: Internal Medicine

## 2019-03-09 VITALS — Ht 73.0 in | Wt 390.0 lb

## 2019-03-09 DIAGNOSIS — I4819 Other persistent atrial fibrillation: Secondary | ICD-10-CM | POA: Diagnosis not present

## 2019-03-09 DIAGNOSIS — I10 Essential (primary) hypertension: Secondary | ICD-10-CM | POA: Diagnosis not present

## 2019-03-09 DIAGNOSIS — G4733 Obstructive sleep apnea (adult) (pediatric): Secondary | ICD-10-CM | POA: Diagnosis not present

## 2019-03-09 NOTE — Progress Notes (Signed)
Electrophysiology TeleHealth Note   Due to national recommendations of social distancing due to [place] 19, an audio telehealth visit is felt to be most appropriate for this patient at this time.  Verbal consent was obtained by me for the telehealth visit today.  The patient does not have capability for a virtual visit.  A phone visit is therefore required today.   Date:  03/09/2019   ID:  Jesus Rodriguez, DOB 10/28/1950, MRN [id number]  Location: patient's home  Provider location:  [facility]  Evaluation Performed: Follow-up visit  PCP:  Patient, [name]   Electrophysiologist:  Dr Rayann Heman  Chief Complaint:  AF follow up  History of Present Illness:    Jesus Rodriguez is a 68 y.o. male who presents via telehealth conferencing today.  Since last being seen in our clinic, the patient reports doing reasonably well.  Not very active.  Was in afib on follow-up in September but unaware.  He appears to be asymptomatic with his arrhythmias.  Today, he denies symptoms of palpitations, chest pain, shortness of breath,  dizziness, presyncope, or syncope.  The patient is otherwise without complaint today.  The patient denies symptoms of fevers, chills, cough, or new SOB worrisome for COVID 19.  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      benazepril (LOTENSIN) 20 MG tablet Take 1 tablet (20 mg total) by mouth daily. 90 tablet 3   dofetilide (TIKOSYN) 500 MCG capsule TAKE 1 CAPSULE (500 MCG TOTAL) BY MOUTH 2 (TWO) TIMES DAILY. 180 capsule 0   doxepin (SINEQUAN) 25 MG capsule Take 25 mg by mouth as needed.      doxycycline (VIBRAMYCIN) 100 MG capsule Take 100 mg by mouth daily.      HYDROcodone-acetaminophen (NORCO/VICODIN) 5-325 MG tablet Take 1 tablet by mouth 2 (two) times daily as needed for moderate pain. 60 tablet 0   metoprolol tartrate (LOPRESSOR) 25 MG tablet Take 1 tablet (25 mg total) by mouth 2 (two) times daily. 180 tablet 3   sildenafil (VIAGRA) 100 MG tablet TAKE 1/2 TABLET 30 MINS BEFORE 4 tablet 6   simvastatin (ZOCOR) 20 MG tablet Take 1 tablet (20 mg total) by mouth daily at 6 PM. 30 tablet 3   tamsulosin (FLOMAX) 0.4 MG CAPS capsule Take by mouth daily.     tiotropium (SPIRIVA) 18 MCG inhalation capsule Place 18 mcg into inhaler and inhale daily.     torsemide (DEMADEX) 20 MG tablet Take 40 mg by mouth daily.     warfarin (COUMADIN) 10 MG tablet TAKE  1/2 TO 1 TABLET DAILY AS DIRECTED BY COUMADIN CLINIC. (Patient taking differently: Take 1/2 to 1 tablet daily as directed by coumadin clinic. Lattie Haw)) 90 tablet 1   No current facility-administered medications for this visit.     Allergies:   Celebrex [celecoxib], Methotrexate derivatives, and Sulfa antibiotics   Social History:  The patient  reports that he has been smoking cigarettes. He has a 70.00 pack-year smoking history. He has never used smokeless tobacco. He reports that he does not drink alcohol or use drugs.   Family History:  The patient's  family history includes Cancer in his father;  Diabetes in his son; Epilepsy in his son; Heart attack in his father; Hypertension in his father, sister, sister, and sister.   ROS:  Please see the history of present illness.   All other systems are personally reviewed and negative.    Exam:    Vital Signs:  Ht 6\' 1"  (1.854 m)    Wt (!) 390 lb (176.9 kg)    BMI 51.45 kg/m   Well sounding, alert and conversant, regular work of breathing  Labs/Other Tests and Data Reviewed:    Recent Labs: 04/16/2018: TSH 1.56 05/24/2018: Magnesium 2.4 10/07/2018: ALT 13; BUN 18; Creat 0.66; Hemoglobin 14.8; Platelets 240; Potassium 4.2; Sodium 140   Wt Readings from Last 3 Encounters:  03/09/19 (!) 390 lb (176.9 kg)  01/04/19 (!) 399 lb 3.2 oz (181.1 kg)  10/07/18 (!) 392 lb (177.8 kg)       ASSESSMENT & PLAN:    1.  Persistent atrial fibrillation/atrial flutter He feels that he is mostly maintaining SR though I suspect he is mostly in afib.  He was in afib when he saw Dr Harl Bowie in September (note and ekg reviewed) but unaware.  If he has ongoing afib without symptoms, I would advise that we stop tikosyn and rate control long term. Lifestyle modification is essential for him to do well long term. Labs from 10/2018 reviewed Continue Tikosyn and Warfarin for CHADS2VASC of 2  2.  Morbid obesity Weight loss encouraged He does not think that insurance would pay for bariatric surgery  3.  HTN Stable No change required today  4.  OSA Compliant with CPAP   Follow-up: with me in 6 months    Patient Risk:  after full review of this patients clinical status, I feel that they are at moderate risk at this time.  Today, I have spent 15 minutes with the patient with telehealth technology discussing arrhythmia management .    Army Fossa, MD  03/09/2019 11:33 AM     [place] [address] [phone] (office) [phone] (fax)

## 2019-03-23 ENCOUNTER — Telehealth: Payer: Self-pay | Admitting: Rheumatology

## 2019-03-23 MED ORDER — HYDROCODONE-ACETAMINOPHEN 5-325 MG PO TABS: 1.0000 | ORAL_TABLET | Freq: Two times a day (BID) | ORAL | 0 refills | Status: DC | PRN

## 2019-03-23 NOTE — Telephone Encounter (Signed)
[facility] [place] [facility] [address]

## 2019-03-23 NOTE — Telephone Encounter (Signed)
Last Visit: 10/07/2018 Next Visit: 03/29/2019 UDS: 01/12/2019 c/w Narc Agreement: 01/12/2019  Last fill: 02/21/2019   Okay to refill hydrocodone?

## 2019-03-25 ENCOUNTER — Telehealth: Payer: Self-pay | Admitting: Rheumatology

## 2019-03-25 NOTE — Progress Notes (Signed)
Virtual Visit via Telephone Note  I connected with [name] on 03/29/19 at  9:15 AM EST by telephone and verified that I am speaking with the correct person using two identifiers.  Location: Patient: Home  Provider: Clinic  This service was conducted via virtual visit.  The patient was located at home. I was located in my office.  Consent was obtained prior to the virtual visit and is aware of possible charges through their insurance for this visit.  The patient is an established patient.  Dr. Estanislado Pandy, MD conducted the virtual visit and Hazel Sams, PA-C acted as scribe during the service.  Office staff helped with scheduling follow up visits after the service was conducted.     I discussed the limitations, risks, security and privacy concerns of performing an evaluation and management service by telephone and the availability of in person appointments. I also discussed with the patient that there may be a patient responsible charge related to this service. The patient expressed understanding and agreed to proceed.  CC: Pain in both hands  History of Present Illness: Patient is a 68 year old male with a history of osteoarthritis, seronegative RA, and DDD. He states his RA remains in remission. He continues to have chronic pain in both knee joints.  He has intermittent pain and stiffness in both hands.  He has not noticed any obvious joint swelling.  He has chronic lower back pain.  He takes hydrocodone for pain relief.  He states prior to taking hydrocodone his pain is a 8/10 and after taking hydrocodone it is a 4/10.  He has not had any gout flares.   Review of Systems  Constitutional: Positive for malaise/fatigue. Negative for fever.  HENT:       +Dry mouth  Eyes: Negative for photophobia, pain, discharge and redness.       +Dry eyes  Respiratory: Negative for cough, shortness of breath and wheezing.   Cardiovascular: Negative for chest pain and palpitations.  Gastrointestinal:  Negative for blood in stool, constipation and diarrhea.  Genitourinary: Negative for dysuria.  Musculoskeletal: Positive for back pain and joint pain. Negative for myalgias and neck pain.       +Morning stiffness   Skin: Negative for rash.  Neurological: Negative for dizziness and headaches.  Psychiatric/Behavioral: Negative for depression. The patient is not nervous/anxious and does not have insomnia.       Observations/Objective: Physical Exam  Constitutional: He is oriented to person, place, and time.  Neurological: He is alert and oriented to person, place, and time.  Psychiatric: Mood, memory, affect and judgment normal.   Patient reports morning stiffness for 2   hours.   Patient reports nocturnal pain.  Difficulty dressing/grooming: Denies Difficulty climbing stairs: Reports Difficulty getting out of chair: Reports Difficulty using hands for taps, buttons, cutlery, and/or writing: Denies   Assessment and Plan: Visit Diagnoses: Primary osteoarthritis of both hands -He has intermittent pain and morning stiffness in both hands.  He has not noticed any obvious joint inflammation.  Joint protection and muscle strengthening were discussed.  He was advised to notify us if he develops increased joint pain or joint swelling.  He will follow up in 4-5 months.   Primary osteoarthritis of both knees - He has chronic pain in both knee joints.  He has not noticed any joint swelling or warmth.  He has difficulty climbing steps and getting up from a chair due to the discomfort and stiffness.  He takes hydrocodone for pain  relief.    History of rheumatoid arthritis seronegative - In remission. He has no joint inflammation at this time.  He does not require an immunosuppressive agent at this time.   DDD (degenerative disc disease), lumbar - Chronic pain.  He takes hydrocodone as prescribed for pain relief. His pain is a 8/10 prior to taking hydrocodone and comes down to a 4/10 after taking  hydrocodone.   Hyperuricemia - He has not had any recent gout flares.  Medication monitoring encounter - UDS updated on 01/12/19.  He is due to update UDS this month.  Future order will be placed today.    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    I discussed the assessment and treatment plan with the patient. The patient was provided an opportunity to ask questions and all were answered. The patient agreed with the plan and demonstrated an understanding of the instructions.   The patient was advised to call back or seek an in-person evaluation if the symptoms worsen or if the condition fails to improve as anticipated.  I provided 15 minutes of non-face-to-face time during this encounter.   Bo Merino, MD    Scribed by-  Hazel Sams, PA-C

## 2019-03-25 NOTE — Telephone Encounter (Signed)
FYI: Patient has a phone call appointment on Tuesday 03/29/19. Patient spoke with heart doctor about going on MTX with his condition. Per patient, both his heart doctors do not know of any contraindications with patient going back on MTX. Patient wanted to let you know this if going back on medication is an option.

## 2019-03-29 ENCOUNTER — Telehealth (INDEPENDENT_AMBULATORY_CARE_PROVIDER_SITE_OTHER): Payer: Medicare Other | Admitting: Rheumatology

## 2019-03-29 ENCOUNTER — Encounter: Payer: Self-pay | Admitting: Rheumatology

## 2019-03-29 ENCOUNTER — Other Ambulatory Visit: Payer: Self-pay

## 2019-03-29 ENCOUNTER — Other Ambulatory Visit: Payer: Self-pay | Admitting: Cardiology

## 2019-03-29 DIAGNOSIS — E790 Hyperuricemia without signs of inflammatory arthritis and tophaceous disease: Secondary | ICD-10-CM

## 2019-03-29 DIAGNOSIS — Z5181 Encounter for therapeutic drug level monitoring: Secondary | ICD-10-CM

## 2019-03-29 DIAGNOSIS — G8929 Other chronic pain: Secondary | ICD-10-CM

## 2019-03-29 DIAGNOSIS — M19042 Primary osteoarthritis, left hand: Secondary | ICD-10-CM | POA: Diagnosis not present

## 2019-03-29 DIAGNOSIS — Z8679 Personal history of other diseases of the circulatory system: Secondary | ICD-10-CM

## 2019-03-29 DIAGNOSIS — Z8739 Personal history of other diseases of the musculoskeletal system and connective tissue: Secondary | ICD-10-CM

## 2019-03-29 DIAGNOSIS — M19041 Primary osteoarthritis, right hand: Secondary | ICD-10-CM | POA: Diagnosis not present

## 2019-03-29 DIAGNOSIS — Z8669 Personal history of other diseases of the nervous system and sense organs: Secondary | ICD-10-CM

## 2019-03-29 DIAGNOSIS — C632 Malignant neoplasm of scrotum: Secondary | ICD-10-CM

## 2019-03-29 DIAGNOSIS — Z8659 Personal history of other mental and behavioral disorders: Secondary | ICD-10-CM

## 2019-03-29 DIAGNOSIS — M170 Bilateral primary osteoarthritis of knee: Secondary | ICD-10-CM | POA: Diagnosis not present

## 2019-03-29 DIAGNOSIS — Z87898 Personal history of other specified conditions: Secondary | ICD-10-CM

## 2019-03-29 DIAGNOSIS — I499 Cardiac arrhythmia, unspecified: Secondary | ICD-10-CM

## 2019-03-29 DIAGNOSIS — M5136 Other intervertebral disc degeneration, lumbar region: Secondary | ICD-10-CM

## 2019-03-29 DIAGNOSIS — Z8639 Personal history of other endocrine, nutritional and metabolic disease: Secondary | ICD-10-CM

## 2019-04-01 ENCOUNTER — Other Ambulatory Visit: Payer: Self-pay

## 2019-04-01 DIAGNOSIS — Z5181 Encounter for therapeutic drug level monitoring: Secondary | ICD-10-CM

## 2019-04-18 ENCOUNTER — Other Ambulatory Visit: Payer: Self-pay | Admitting: Cardiology

## 2019-04-23 LAB — PAIN MGMT, PROFILE 5 W/CONF, U
Amphetamines: NEGATIVE ng/mL
Barbiturates: NEGATIVE ng/mL
Benzodiazepines: NEGATIVE ng/mL
Cocaine Metabolite: NEGATIVE ng/mL
Codeine: NEGATIVE ng/mL
Creatinine: 135.1 mg/dL
Hydrocodone: 457 ng/mL
Hydromorphone: 173 ng/mL
Marijuana Metabolite: NEGATIVE ng/mL
Methadone Metabolite: NEGATIVE ng/mL
Morphine: NEGATIVE ng/mL
Norhydrocodone: 503 ng/mL
Opiates: POSITIVE ng/mL
Oxidant: NEGATIVE ug/mL
Oxycodone: NEGATIVE ng/mL
pH: 6.4 (ref 4.5–9.0)

## 2019-04-25 ENCOUNTER — Telehealth: Payer: Self-pay | Admitting: Rheumatology

## 2019-04-25 MED ORDER — HYDROCODONE-ACETAMINOPHEN 5-325 MG PO TABS: 1.0000 | ORAL_TABLET | Freq: Two times a day (BID) | ORAL | 0 refills | Status: DC | PRN

## 2019-04-25 NOTE — Telephone Encounter (Signed)
Last Visit: 03/29/2019 Next Visit: 08/10/2019 UDS:04/21/2019 c/w Narc Agreement: 01/12/2019  Last fill: 03/23/2019   Okay to refill hydrocodone?

## 2019-04-25 NOTE — Progress Notes (Signed)
UDS is consistent with treatment.

## 2019-04-25 NOTE — Telephone Encounter (Signed)
Patient called requesting prescription refill of [facility] [address] [facility] [place], [place] 

## 2019-04-27 ENCOUNTER — Other Ambulatory Visit: Payer: Self-pay | Admitting: Internal Medicine

## 2019-04-27 ENCOUNTER — Other Ambulatory Visit: Payer: Self-pay

## 2019-04-27 ENCOUNTER — Ambulatory Visit (INDEPENDENT_AMBULATORY_CARE_PROVIDER_SITE_OTHER): Payer: Medicare Other | Admitting: *Deleted

## 2019-04-27 DIAGNOSIS — Z5181 Encounter for therapeutic drug level monitoring: Secondary | ICD-10-CM | POA: Diagnosis not present

## 2019-04-27 DIAGNOSIS — I4891 Unspecified atrial fibrillation: Secondary | ICD-10-CM

## 2019-04-27 LAB — POCT INR: INR: 1.9 — AB (ref 2.0–3.0)

## 2019-04-27 NOTE — Patient Instructions (Signed)
Take warfarin 1 1/2 tablets tonight then resume 1/2 tablet daily except 1 tablet on Mondays, Wednesdays and Fridays. Recheck INR in 6 weeks.

## 2019-04-30 ENCOUNTER — Other Ambulatory Visit: Payer: Self-pay | Admitting: Cardiology

## 2019-05-25 ENCOUNTER — Telehealth: Payer: Self-pay | Admitting: Rheumatology

## 2019-05-25 MED ORDER — HYDROCODONE-ACETAMINOPHEN 5-325 MG PO TABS: 1.0000 | ORAL_TABLET | Freq: Two times a day (BID) | ORAL | 0 refills | Status: DC | PRN

## 2019-05-25 NOTE — Telephone Encounter (Signed)
UDS was updated on 04/21/19.  UDS was consistent with treatment.

## 2019-05-25 NOTE — Telephone Encounter (Signed)
Patient request [facility]ill on [facility] [address]

## 2019-05-25 NOTE — Telephone Encounter (Signed)
Last Visit: 03/29/2019 Next Visit: 08/10/2019 UDS:04/21/2019 c/w Narc Agreement: 01/12/2019  Last fill: 04/25/2019  Okay to refill hydrocodone?

## 2019-05-26 ENCOUNTER — Telehealth: Payer: Self-pay | Admitting: *Deleted

## 2019-05-26 NOTE — Telephone Encounter (Signed)
Pt voiced understanding and appreciative  

## 2019-05-26 NOTE — Telephone Encounter (Signed)
-----   Message from Malen Gauze, RN sent at 05/26/2019 11:12 AM EST ----- Jesus Rodriguez please call if you don't mind. No need to hold hold warfarin.  Moderna should not have any effect.  Probably due to sinus infection.  Recommend Saline nasal spray prn for added moisture.  Call if bleeding increases or not able to control. Thanks, Jesus Rodriguez ----- Message ----- From: Massie Maroon, CMA Sent: 05/26/2019  10:17 AM EST To: Malen Gauze, RN  Pt c/o nose bleeds since having first dose of covid vaccine Moderna last Friday - says nose bleeds last only a few seconds at a time several times daily since last Friday - denies any other bleeding - did have recent sinus infection - denies any recent med changes - wanted to make sure that his was ok to continue coumadin. I will be glad to call him back if you want me to, I told him I doubt you would recommend holding coumadin   [phone]

## 2019-06-10 ENCOUNTER — Telehealth: Payer: Self-pay

## 2019-06-10 NOTE — Telephone Encounter (Signed)
Pt returning ([place])Stephanie's call Pt did not give reason  Please call [phone]  Thanks renee

## 2019-06-10 NOTE — Telephone Encounter (Signed)
Stated that he was told to call back in regards to getting a call from contact tracing.  Stated that his daughter was in contact with a person that was covid positive & he was around her.  He states that [facility] [place] [facility].  He will let us know if his turns out to be positive & we can move his INR check out till after his quarantine time.  He verbalized understanding.

## 2019-06-21 ENCOUNTER — Encounter: Payer: Self-pay | Admitting: Cardiology

## 2019-06-21 ENCOUNTER — Telehealth (INDEPENDENT_AMBULATORY_CARE_PROVIDER_SITE_OTHER): Payer: Medicare Other | Admitting: Cardiology

## 2019-06-21 VITALS — HR 79 | Ht 73.0 in | Wt 396.0 lb

## 2019-06-21 DIAGNOSIS — I4891 Unspecified atrial fibrillation: Secondary | ICD-10-CM

## 2019-06-21 DIAGNOSIS — I5033 Acute on chronic diastolic (congestive) heart failure: Secondary | ICD-10-CM

## 2019-06-21 NOTE — Progress Notes (Signed)
Virtual Visit via Telephone Note   This visit type was conducted due to national recommendations for restrictions regarding the COVID-19 Pandemic (e.g. social distancing) in an effort to limit this patient's exposure and mitigate transmission in our community.  Due to his co-morbid illnesses, this patient is at least at moderate risk for complications without adequate follow up.  This format is felt to be most appropriate for this patient at this time.  The patient did not have access to video technology/had technical difficulties with video requiring transitioning to audio format only (telephone).  All issues noted in this document were discussed and addressed.  No physical exam could be performed with this format.  Please refer to the patient's chart for his  consent to telehealth for [facility].   The patient was identified using 2 identifiers.  Date:  06/21/2019   ID:  [name], DOB 20-Mar-1951, MRN [id number]  Patient Location: Home Provider Location: Office  PCP:  Patient, [name]  Cardiologist:  Carlyle Dolly, MD  Electrophysiologist:  Thompson Grayer, MD   Evaluation Performed:  Follow-Up Visit  Chief Complaint:  Follow up visit  History of Present Illness:    Jesus Rodriguez is a 69 y.o. male seen today for follow up of the following medical problems.   1. Afib and flutter - notes indicate history of typical flutter s/p CTI ablation 03/2016 - history of afib Preiviously followed by [facility] - failed sotalol, started on dofetilide, followed by EP Dr Rayann Heman  - was in rate controlled afib last visit, asymptomatic. - no recent palpitations - compliant with meds. No bleeding on coumadin    2.Chronic diastolic HF -0000000 echo LVEF 55-60%,  - weights by our scale 399, he reports his home scale 392 lbs Baseline is around 388 lbs. He lowered his torsemide to 40mg  just once daily recently due to lack of swelling. Stable SOb unchanged, has had some increased LE  edema.   - some recent SOB/DOE - some increased LE edema - taking torsemide 40mg  bid. Sometimes missing evening dose.      3. COPD - compliant with inhaler - no recent coughing or wheezing - some recent sinus congestion.   The patient does not have symptoms concerning for COVID-19 infection (fever, chills, cough, or new shortness of breath).    Past Medical History:  Diagnosis Date  . Anxiety 02/13/2016  . Atrial flutter ([place]) 02/13/2016   s/p CTI ablation at [facility]  . DDD (degenerative disc disease), lumbar 02/13/2016  . Elevated cholesterol 02/13/2016  . Hypertension 02/13/2016  . Insomnia 02/13/2016  . Osteoarthritis of both hands 02/13/2016  . Osteoarthritis of both knees 02/13/2016  . Persistent atrial fibrillation ([place])   . RA (rheumatoid arthritis) ([place]) 02/13/2016   Sero Negative  . 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 Sleep apnea 02/13/2016   Past Surgical History:  Procedure Laterality Date  . A-FLUTTER ABLATION     at [facility] 2018  . CARDIOVERSION  02/10/2018  . CARDIOVERSION N/A 05/13/2018   Procedure: CARDIOVERSION;  Surgeon: Sanda Klein, MD;  Location: [facility];  Service: Cardiovascular;  Laterality: N/A;  . CATARACT EXTRACTION       Current Meds  Medication Sig  . acetaminophen (TYLENOL) 500 MG tablet Take by mouth as needed.   . benazepril (LOTENSIN) 20 MG tablet Take 1 tablet (20 mg total) by mouth daily.  Marland Kitchen dofetilide (TIKOSYN) 500 MCG capsule TAKE 1 CAPSULE BY MOUTH TWICE A DAY  . doxepin (SINEQUAN) 25 MG  capsule Take 25 mg by mouth as needed.   Marland Kitchen HYDROcodone-acetaminophen (NORCO/VICODIN) 5-325 MG tablet Take 1 tablet by mouth 2 (two) times daily as needed for moderate pain.  . metoprolol tartrate (LOPRESSOR) 25 MG tablet TAKE 1 TABLET BY MOUTH TWICE A DAY  . sildenafil (VIAGRA) 100 MG tablet TAKE 1/2 TABLET 30 MINS BEFORE  . simvastatin (ZOCOR) 20 MG tablet TAKE 1 TABLET (20 MG TOTAL) BY MOUTH DAILY AT 6 PM  . tamsulosin (FLOMAX) 0.4 MG CAPS capsule Take  by mouth daily.  Marland Kitchen tiotropium (SPIRIVA) 18 MCG inhalation capsule Place 18 mcg into inhaler and inhale daily.  Marland Kitchen torsemide (DEMADEX) 20 MG tablet Take 20 mg by mouth daily.  Marland Kitchen warfarin (COUMADIN) 10 MG tablet TAKE 1/2 TO 1 TABLET DAILY AS DIRECTED BY COUMADIN CLINIC. (Patient taking differently: Take 1/2 to 1 tablet daily as directed by coumadin clinic. Lattie Haw))  . [DISCONTINUED] doxycycline (VIBRAMYCIN) 100 MG capsule Take 100 mg by mouth daily.   . [DISCONTINUED] torsemide (DEMADEX) 20 MG tablet TAKE 2 TABLETS (40 MG TOTAL) BY MOUTH 2 (TWO) TIMES DAILY. (Patient taking differently: 20 mg daily. )     Allergies:   Celebrex [celecoxib], Methotrexate derivatives, and Sulfa antibiotics   Social History   Tobacco Use  . Smoking status: Current Every Day Smoker    Packs/day: 2.00    Years: 35.00    Pack years: 70.00    Types: Cigarettes  . Smokeless tobacco: Never Used  Substance Use Topics  . Alcohol use: No  . Drug use: No     Family Hx: The patient's family history includes Cancer in his father; Diabetes in his son; Epilepsy in his son; Heart attack in his father; Hypertension in his father, sister, sister, and sister.  ROS:   Please see the history of present illness.     All other systems reviewed and are negative.   Prior CV studies:   The following studies were reviewed today:  10/2018 echo 1. Images are limited. 2. The left ventricle has grossly normal systolic function, with an ejection fraction of 55-60%. The cavity size was normal. There is mildly increased left ventricular wall thickness. Left ventricular diastolic Doppler parameters are indeterminate. 3. The right ventricle has normal systolic function. The cavity was normal. There is no increase in right ventricular wall thickness. Right ventricular systolic pressure is normal with an estimated pressure of 12.5 mmHg. 4. The aortic valve is tricuspid. Mild calcification of the aortic valve. Mild aortic annular  calcification noted. 5. The mitral valve is grossly normal. There is mild mitral annular calcification present. 6. The tricuspid valve is grossly normal. 7. The aorta is abnormal in size and structure. 8. There is mild dilatation of the aortic root. 9. The interatrial septum was not well visualized.  Labs/Other Tests and Data Reviewed:    EKG:  No ECG reviewed.  Recent Labs: 10/07/2018: ALT 13; BUN 18; Creat 0.66; Hemoglobin 14.8; Platelets 240; Potassium 4.2; Sodium 140   Recent Lipid Panel No results found for: CHOL, TRIG, HDL, CHOLHDL, LDLCALC, LDLDIRECT  Wt Readings from Last 3 Encounters:  06/21/19 (!) 396 lb (179.6 kg)  03/09/19 (!) 390 lb (176.9 kg)  01/04/19 (!) 399 lb 3.2 oz (181.1 kg)     Objective:    Vital Signs:  Pulse 79   Ht 6\' 1"  (1.854 m)   Wt (!) 396 lb (179.6 kg)   BMI 52.25 kg/m    Normal affect. Normal speech pattern and tone. Comfortable, no  apparent distress. No audible signs of SOB or wheezing.   ASSESSMENT & PLAN:    1. Aflutter/Afib - history of aflutter ablation. Also with afib previously followed by [facility] - failed sotalol, started on dofetilide.  - EKG at last visit showed rate controlled afib. Monitoring at this time, if persists would come off dofetilide    2.Acute on chronic diastolic HF -some recent weight gain and SOB, he often misses his second dose of torsemide. Encouraged regular compliance particularly over the next few days.        F/u 2 months in clinic  COVID-19 Education: The signs and symptoms of COVID-19 were discussed with the patient and how to seek care for testing (follow up with PCP or arrange E-visit).  The importance of social distancing was discussed today.  Time:   Today, I have spent 23 minutes with the patient with telehealth technology discussing the above problems.     Medication Adjustments/Labs and Tests Ordered: Current medicines are reviewed at length with the patient today.   Concerns regarding medicines are outlined above.   Tests Ordered: No orders of the defined types were placed in this encounter.   Medication Changes: No orders of the defined types were placed in this encounter.   Follow Up:  In Person in 2 month(s)  Signed, Carlyle Dolly, MD  06/21/2019 8:16 AM    [facility]

## 2019-06-21 NOTE — Patient Instructions (Signed)
Your physician recommends that you schedule a follow-up appointment in: 2 MONTHS WITH DR BRANCH  Your physician recommends that you continue on your current medications as directed. Please refer to the Current Medication list given to you today.  Thank you for choosing  [facility]!!    

## 2019-06-22 ENCOUNTER — Telehealth: Payer: Self-pay | Admitting: Rheumatology

## 2019-06-22 MED ORDER — HYDROCODONE-ACETAMINOPHEN 5-325 MG PO TABS: 1.0000 | ORAL_TABLET | Freq: Two times a day (BID) | ORAL | 0 refills | Status: DC | PRN

## 2019-06-22 NOTE — Telephone Encounter (Signed)
Last Visit: 03/29/19 Next Visit: 08/10/19 UDS: 04/21/19 Narc Agreement: 12/12/18  Okay to refill Hydrocodone?

## 2019-06-22 NOTE — Telephone Encounter (Signed)
Patient left a voicemail requesting prescription refill of Hydrocodone.

## 2019-07-06 ENCOUNTER — Other Ambulatory Visit: Payer: Self-pay

## 2019-07-06 ENCOUNTER — Ambulatory Visit (INDEPENDENT_AMBULATORY_CARE_PROVIDER_SITE_OTHER): Payer: Medicare Other | Admitting: *Deleted

## 2019-07-06 DIAGNOSIS — Z5181 Encounter for therapeutic drug level monitoring: Secondary | ICD-10-CM | POA: Diagnosis not present

## 2019-07-06 DIAGNOSIS — I4891 Unspecified atrial fibrillation: Secondary | ICD-10-CM

## 2019-07-06 LAB — POCT INR: INR: 1.8 — AB (ref 2.0–3.0)

## 2019-07-06 NOTE — Patient Instructions (Signed)
Increase warfarin to 1 tablet daily except 1/2 tablet on Sundays, Tuesdays and Thursdays Recheck INR in 6 weeks.

## 2019-07-22 ENCOUNTER — Telehealth: Payer: Self-pay | Admitting: Rheumatology

## 2019-07-22 MED ORDER — HYDROCODONE-ACETAMINOPHEN 5-325 MG PO TABS: 1.0000 | ORAL_TABLET | Freq: Two times a day (BID) | ORAL | 0 refills | Status: DC | PRN

## 2019-07-22 NOTE — Telephone Encounter (Signed)
Last Visit: 03/29/19 Next Visit: 08/10/19 UDS: 04/21/19 Narc Agreement: 12/12/18  Last Fill 06/22/19  Okay to refill Hydrocodone?

## 2019-07-22 NOTE — Telephone Encounter (Signed)
Patient request [facility]ill [facility] [place]

## 2019-08-08 NOTE — Progress Notes (Deleted)
Office Visit Note  Patient: Jesus Rodriguez             Date of Birth: [birth year]           MRN: [id number]             PCP: Patient, [name] Referring: No ref. provider found Visit Date: 08/10/2019 Occupation: @GUAROCC @  Subjective:  No chief complaint on file.   History of Present Illness: Jesus Rodriguez is a 69 y.o. male ***   Activities of Daily Living:  Patient reports morning stiffness for *** {minute/hour:19697}.   Patient {ACTIONS;DENIES/REPORTS:21021675::"Denies"} nocturnal pain.  Difficulty dressing/grooming: {ACTIONS;DENIES/REPORTS:21021675::"Denies"} Difficulty climbing stairs: {ACTIONS;DENIES/REPORTS:21021675::"Denies"} Difficulty getting out of chair: {ACTIONS;DENIES/REPORTS:21021675::"Denies"} Difficulty using hands for taps, buttons, cutlery, and/or writing: {ACTIONS;DENIES/REPORTS:21021675::"Denies"}  No Rheumatology ROS completed.   PMFS History:  Patient Active Problem List   Diagnosis Date Noted  . Atrial fibrillation ([place]) 05/11/2018  . Encounter for therapeutic drug monitoring 04/22/2018  . Persistent atrial fibrillation ([place]) 04/08/2018  . History of rheumatoid arthritis seronegative 09/10/2017  . High risk medications (not anticoagulants) long-term use 02/14/2016  . Class 3 obesity in adult 02/14/2016  . Depression 02/14/2016  . Osteoarthritis of both hands 02/13/2016  . Osteoarthritis of both knees 02/13/2016  . DDD (degenerative disc disease), lumbar 02/13/2016  . 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 Hypertension 02/13/2016  . Elevated cholesterol 02/13/2016  . Insomnia 02/13/2016  . Anxiety 02/13/2016  . Sleep apnea 02/13/2016  . Arrhythmia 02/13/2016    Past Medical History:  Diagnosis Date  . Anxiety 02/13/2016  . Atrial flutter ([place]) 02/13/2016   s/p [facility] ablation at [facility]  . DDD (degenerative disc disease), lumbar 02/13/2016  . Elevated cholesterol 02/13/2016  . Hypertension 02/13/2016  . Insomnia 02/13/2016  . Osteoarthritis of both  hands 02/13/2016  . Osteoarthritis of both knees 02/13/2016  . Persistent atrial fibrillation ([place])   . RA (rheumatoid arthritis) ([place]) 02/13/2016   Sero Negative  . 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 Sleep apnea 02/13/2016    Family History  Problem Relation Age of Onset  . Hypertension Father   . Heart attack Father   . Cancer Father        prostate  . Hypertension Sister   . Hypertension Sister   . Hypertension Sister   . Epilepsy Son   . Diabetes Son    Past Surgical History:  Procedure Laterality Date  . A-FLUTTER ABLATION     at [facility] 2018  . CARDIOVERSION  02/10/2018  . CARDIOVERSION N/A 05/13/2018   Procedure: CARDIOVERSION;  Surgeon: Sanda Klein, MD;  Location: [facility];  Service: Cardiovascular;  Laterality: N/A;  . CATARACT EXTRACTION     Social History   Social History Narrative  . Not on file   Immunization History  Administered Date(s) Administered  . Influenza, High Dose Seasonal PF 05/14/2018  . Influenza,inj,Quad PF,6+ Mos 01/04/2019     Objective: Vital Signs: There were no vitals taken for this visit.   Physical Exam   Musculoskeletal Exam: ***  CDAI Exam: CDAI Score: -- Patient Global: --; Provider Global: -- Swollen: --; Tender: -- Joint Exam 08/10/2019   No joint exam has been documented for this visit   There is currently no information documented on the homunculus. Go to the Rheumatology activity and complete the homunculus joint exam.  Investigation: No additional findings.  Imaging: No results found.  Recent Labs: Lab Results  Component Value Date   WBC 7.8 10/07/2018   HGB 14.8 10/07/2018  PLT 240 10/07/2018   NA 140 10/07/2018   K 4.2 10/07/2018   CL 97 (L) 10/07/2018   CO2 33 (H) 10/07/2018   GLUCOSE 115 (H) 10/07/2018   BUN 18 10/07/2018   CREATININE 0.66 (L) 10/07/2018   BILITOT 0.5 10/07/2018   ALKPHOS 86 08/20/2016   AST 13 10/07/2018   ALT 13 10/07/2018   PROT 6.9 10/07/2018   ALBUMIN 4.1 08/20/2016    CALCIUM 9.3 10/07/2018   GFRAA 115 10/07/2018    Speciality Comments: No specialty comments available.  Procedures:  No procedures performed Allergies: Celebrex [celecoxib], Methotrexate derivatives, and Sulfa antibiotics   Assessment / Plan:     Visit Diagnoses: No diagnosis found.  Orders: No orders of the defined types were placed in this encounter.  No orders of the defined types were placed in this encounter.   Face-to-face time spent with patient was *** minutes. Greater than 50% of time was spent in counseling and coordination of care.  Follow-Up Instructions: No follow-ups on file.   Ofilia Neas, PA-C  Note - This record has been created using Dragon software.  Chart creation errors have been sought, but may not always  have been located. Such creation errors do not reflect on  the standard of medical care.

## 2019-08-10 ENCOUNTER — Telehealth: Payer: Self-pay | Admitting: Cardiology

## 2019-08-10 ENCOUNTER — Ambulatory Visit: Payer: Medicare Other | Admitting: Rheumatology

## 2019-08-10 NOTE — Telephone Encounter (Signed)
SOB is no more than normal.  No chest pain or dizziness.  Legs & ankles swelling - discussed at last VV.  Seems to be worsening over the last several weeks.  Normally does 40mg  Torsemide twice a day.  Stated he had gotten off track due to having to take care of handicapped son.  Been back on twice a day at least 7-10 days now.  Been to the bathroom about 5 times so far.  Only the left leg that is oozing fluid.

## 2019-08-10 NOTE — Telephone Encounter (Signed)
Patient notified and verbalized understanding. 

## 2019-08-10 NOTE — Telephone Encounter (Signed)
Patient called back. Stated he answered but could not hear the person calling

## 2019-08-10 NOTE — Telephone Encounter (Signed)
If has been taking his torsemide 40mg  bid consistently over the last week then would increase to 60mg  bid and update [place] early next week   Zandra Abts MD

## 2019-08-10 NOTE — Telephone Encounter (Signed)
No answer

## 2019-08-10 NOTE — Telephone Encounter (Signed)
Patient called about fluid   He has gained 7 pounds in last week or more and his left lower leg is seaping fluid he stated

## 2019-08-17 ENCOUNTER — Ambulatory Visit (INDEPENDENT_AMBULATORY_CARE_PROVIDER_SITE_OTHER): Payer: Medicare Other | Admitting: *Deleted

## 2019-08-17 ENCOUNTER — Other Ambulatory Visit: Payer: Self-pay

## 2019-08-17 DIAGNOSIS — I4891 Unspecified atrial fibrillation: Secondary | ICD-10-CM | POA: Diagnosis not present

## 2019-08-17 DIAGNOSIS — Z5181 Encounter for therapeutic drug level monitoring: Secondary | ICD-10-CM | POA: Diagnosis not present

## 2019-08-17 LAB — POCT INR: INR: 2.2 (ref 2.0–3.0)

## 2019-08-17 NOTE — Patient Instructions (Signed)
Continue warfarin 1 tablet daily except 1/2 tablet on Sundays, Tuesdays and Thursdays Recheck INR in 6 weeks.  

## 2019-08-19 ENCOUNTER — Telehealth: Payer: Self-pay | Admitting: Rheumatology

## 2019-08-19 MED ORDER — HYDROCODONE-ACETAMINOPHEN 5-325 MG PO TABS: 1.0000 | ORAL_TABLET | Freq: Two times a day (BID) | ORAL | 0 refills | Status: DC | PRN

## 2019-08-19 NOTE — Telephone Encounter (Signed)
Patient called requesting prescription refill of [facility] [address] in [place]. 

## 2019-08-19 NOTE — Telephone Encounter (Signed)
Last Visit:03/29/19 Next Visit:09/07/2019  UDS:04/21/19 Narc Agreement:12/12/18  Last Fill: 07/22/2019  Noted patient's appointment on 09/07/2019 to update UDS and narc agreement  Okay to refillHydrocodone?

## 2019-08-26 ENCOUNTER — Ambulatory Visit: Payer: Medicare Other | Admitting: Cardiology

## 2019-08-26 NOTE — Progress Notes (Deleted)
Office Visit Note  Patient: Jesus Rodriguez             Date of Birth: [birth year]           MRN: [id number]             PCP: Patient, [name] Referring: No ref. provider found Visit Date: 09/07/2019 Occupation: @GUAROCC @  Subjective:  No chief complaint on file.   History of Present Illness: Jesus Rodriguez is a 69 y.o. male ***   Activities of Daily Living:  Patient reports morning stiffness for *** {minute/hour:19697}.   Patient {ACTIONS;DENIES/REPORTS:21021675::"Denies"} nocturnal pain.  Difficulty dressing/grooming: {ACTIONS;DENIES/REPORTS:21021675::"Denies"} Difficulty climbing stairs: {ACTIONS;DENIES/REPORTS:21021675::"Denies"} Difficulty getting out of chair: {ACTIONS;DENIES/REPORTS:21021675::"Denies"} Difficulty using hands for taps, buttons, cutlery, and/or writing: {ACTIONS;DENIES/REPORTS:21021675::"Denies"}  No Rheumatology ROS completed.   PMFS History:  Patient Active Problem List   Diagnosis Date Noted  . Atrial fibrillation ([place]) 05/11/2018  . Encounter for therapeutic drug monitoring 04/22/2018  . Persistent atrial fibrillation ([place]) 04/08/2018  . History of rheumatoid arthritis seronegative 09/10/2017  . High risk medications (not anticoagulants) long-term use 02/14/2016  . Class 3 obesity in adult 02/14/2016  . Depression 02/14/2016  . Osteoarthritis of both hands 02/13/2016  . Osteoarthritis of both knees 02/13/2016  . DDD (degenerative disc disease), lumbar 02/13/2016  . 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 Hypertension 02/13/2016  . Elevated cholesterol 02/13/2016  . Insomnia 02/13/2016  . Anxiety 02/13/2016  . Sleep apnea 02/13/2016  . Arrhythmia 02/13/2016    Past Medical History:  Diagnosis Date  . Anxiety 02/13/2016  . Atrial flutter ([place]) 02/13/2016   s/p CTI ablation at [facility]  . DDD (degenerative disc disease), lumbar 02/13/2016  . Elevated cholesterol 02/13/2016  . Hypertension 02/13/2016  . Insomnia 02/13/2016  . Osteoarthritis of both  hands 02/13/2016  . Osteoarthritis of both knees 02/13/2016  . Persistent atrial fibrillation ([place])   . RA (rheumatoid arthritis) ([place]) 02/13/2016   Sero Negative  . 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 Sleep apnea 02/13/2016    Family History  Problem Relation Age of Onset  . Hypertension Father   . Heart attack Father   . Cancer Father        prostate  . Hypertension Sister   . Hypertension Sister   . Hypertension Sister   . Epilepsy Son   . Diabetes Son    Past Surgical History:  Procedure Laterality Date  . A-FLUTTER ABLATION     at [facility] 2018  . CARDIOVERSION  02/10/2018  . CARDIOVERSION N/A 05/13/2018   Procedure: CARDIOVERSION;  Surgeon: Sanda Klein, MD;  Location: [facility];  Service: Cardiovascular;  Laterality: N/A;  . CATARACT EXTRACTION     Social History   Social History Narrative  . Not on file   Immunization History  Administered Date(s) Administered  . Influenza, High Dose Seasonal PF 05/14/2018  . Influenza,inj,Quad PF,6+ Mos 01/04/2019     Objective: Vital Signs: There were no vitals taken for this visit.   Physical Exam   Musculoskeletal Exam: ***  CDAI Exam: CDAI Score: -- Patient Global: --; Provider Global: -- Swollen: --; Tender: -- Joint Exam 09/07/2019   No joint exam has been documented for this visit   There is currently no information documented on the homunculus. Go to the Rheumatology activity and complete the homunculus joint exam.  Investigation: No additional findings.  Imaging: No results found.  Recent Labs: Lab Results  Component Value Date   WBC 7.8 10/07/2018   HGB 14.8 10/07/2018  PLT 240 10/07/2018   NA 140 10/07/2018   K 4.2 10/07/2018   CL 97 (L) 10/07/2018   CO2 33 (H) 10/07/2018   GLUCOSE 115 (H) 10/07/2018   BUN 18 10/07/2018   CREATININE 0.66 (L) 10/07/2018   BILITOT 0.5 10/07/2018   ALKPHOS 86 08/20/2016   AST 13 10/07/2018   ALT 13 10/07/2018   PROT 6.9 10/07/2018   ALBUMIN 4.1 08/20/2016    CALCIUM 9.3 10/07/2018   GFRAA 115 10/07/2018    Speciality Comments: No specialty comments available.  Procedures:  No procedures performed Allergies: Celebrex [celecoxib], Methotrexate derivatives, and Sulfa antibiotics   Assessment / Plan:     Visit Diagnoses: No diagnosis found.  Orders: No orders of the defined types were placed in this encounter.  No orders of the defined types were placed in this encounter.   Face-to-face time spent with patient was *** minutes. Greater than 50% of time was spent in counseling and coordination of care.  Follow-Up Instructions: No follow-ups on file.   Earnestine Mealing, CMA  Note - This record has been created using Editor, commissioning.  Chart creation errors have been sought, but may not always  have been located. Such creation errors do not reflect on  the standard of medical care.

## 2019-09-06 ENCOUNTER — Other Ambulatory Visit: Payer: Self-pay

## 2019-09-06 ENCOUNTER — Ambulatory Visit (INDEPENDENT_AMBULATORY_CARE_PROVIDER_SITE_OTHER): Payer: Medicare Other | Admitting: Cardiology

## 2019-09-06 ENCOUNTER — Encounter: Payer: Self-pay | Admitting: Cardiology

## 2019-09-06 VITALS — BP 100/58 | HR 77 | Ht 73.0 in | Wt 396.8 lb

## 2019-09-06 DIAGNOSIS — I5033 Acute on chronic diastolic (congestive) heart failure: Secondary | ICD-10-CM | POA: Diagnosis not present

## 2019-09-06 DIAGNOSIS — I4891 Unspecified atrial fibrillation: Secondary | ICD-10-CM | POA: Diagnosis not present

## 2019-09-06 NOTE — Patient Instructions (Signed)
Your physician recommends that you schedule a follow-up appointment in: 1 MONTH WITH DR BRANCH  Your physician recommends that you continue on your current medications as directed. Please refer to the Current Medication list given to you today.  Your physician recommends that you return for lab work BMP/MG  Thank you for choosing [facility]!!    

## 2019-09-06 NOTE — Progress Notes (Signed)
Clinical Summary Mr. Gatt is a 69 y.o.male seen today for follow up of the following medical problems.   1. Afib and flutter - notes indicate history of typical flutter s/p CTI ablation 03/2016 - history of afib Preiviously followed by [facility] - failed sotalol, started on dofetilide, followed by EP Dr Rayann Heman  -no recent symptoms    2.Chronic diastolic HF -0000000 echo LVEF 55-60%,  - phone call end of April about increased edema and SOB - torsemide increased to 60mg  bid - home weight high as 402 2 weeks ago, today 394 lbs.  - ongoing SOB   3. COPD -followed by pulmonary [place]   4. OSA  on cpap - followed by pulmonary [facility]  Past Medical History:  Diagnosis Date  . Anxiety 02/13/2016  . Atrial flutter ([place]) 02/13/2016   s/p CTI ablation at [facility]  . DDD (degenerative disc disease), lumbar 02/13/2016  . Elevated cholesterol 02/13/2016  . Hypertension 02/13/2016  . Insomnia 02/13/2016  . Osteoarthritis of both hands 02/13/2016  . Osteoarthritis of both knees 02/13/2016  . Persistent atrial fibrillation ([place])   . RA (rheumatoid arthritis) ([place]) 02/13/2016   Sero Negative  . 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 Sleep apnea 02/13/2016     Allergies  Allergen Reactions  . Celebrex [Celecoxib]   . Methotrexate Derivatives Other (See Comments)    Arrhythmia   . Sulfa Antibiotics      Current Outpatient Medications  Medication Sig Dispense Refill  . acetaminophen (TYLENOL) 500 MG tablet Take by mouth as needed.     . benazepril (LOTENSIN) 20 MG tablet Take 1 tablet (20 mg total) by mouth daily. 90 tablet 3  . dofetilide (TIKOSYN) 500 MCG capsule TAKE 1 CAPSULE BY MOUTH TWICE A DAY 180 capsule 1  . doxepin (SINEQUAN) 25 MG capsule Take 25 mg by mouth as needed.     Marland Kitchen HYDROcodone-acetaminophen (NORCO/VICODIN) 5-325 MG tablet Take 1 tablet by mouth 2 (two) times daily as needed for moderate pain. 60 tablet 0  . metoprolol tartrate (LOPRESSOR) 25 MG  tablet TAKE 1 TABLET BY MOUTH TWICE A DAY 180 tablet 1  . sildenafil (VIAGRA) 100 MG tablet TAKE 1/2 TABLET 30 MINS BEFORE 4 tablet 6  . simvastatin (ZOCOR) 20 MG tablet TAKE 1 TABLET (20 MG TOTAL) BY MOUTH DAILY AT 6 PM 90 tablet 1  . tamsulosin (FLOMAX) 0.4 MG CAPS capsule Take by mouth daily.    Marland Kitchen tiotropium (SPIRIVA) 18 MCG inhalation capsule Place 18 mcg into inhaler and inhale daily.    Marland Kitchen torsemide (DEMADEX) 20 MG tablet Take 40 mg by mouth 2 (two) times daily.     Marland Kitchen warfarin (COUMADIN) 10 MG tablet TAKE 1/2 TO 1 TABLET DAILY AS DIRECTED BY COUMADIN CLINIC. (Patient taking differently: Take 1/2 to 1 tablet daily as directed by coumadin clinic. Lattie Haw)) 90 tablet 1   No current facility-administered medications for this visit.     Past Surgical History:  Procedure Laterality Date  . A-FLUTTER ABLATION     at [facility] 2018  . CARDIOVERSION  02/10/2018  . CARDIOVERSION N/A 05/13/2018   Procedure: CARDIOVERSION;  Surgeon: Sanda Klein, MD;  [facility];  Service: Cardiovascular;  Laterality: N/A;  . CATARACT EXTRACTION       Allergies  Allergen Reactions  . Celebrex [Celecoxib]   . Methotrexate Derivatives Other (See Comments)    Arrhythmia   . Sulfa Antibiotics       Family History  Problem Relation  Age of Onset  . Hypertension Father   . Heart attack Father   . Cancer Father        prostate  . Hypertension Sister   . Hypertension Sister   . Hypertension Sister   . Epilepsy Son   . Diabetes Son      Social History Mr. Artrip reports that he has been smoking cigarettes. He has a 70.00 pack-year smoking history. He has never used smokeless tobacco. Mr. Gruss reports no history of alcohol use.   Review of Systems CONSTITUTIONAL: No weight loss, fever, chills, weakness or fatigue.  HEENT: Eyes: No visual loss, blurred vision, double vision or yellow sclerae.No hearing loss, sneezing, congestion, runny nose or sore throat.  SKIN: No rash or itching.    CARDIOVASCULAR: per hpi RESPIRATORY: No shortness of breath, cough or sputum.  GASTROINTESTINAL: No anorexia, nausea, vomiting or diarrhea. No abdominal pain or blood.  GENITOURINARY: No burning on urination, no polyuria NEUROLOGICAL: No headache, dizziness, syncope, paralysis, ataxia, numbness or tingling in the extremities. No change in bowel or bladder control.  MUSCULOSKELETAL: No muscle, back pain, joint pain or stiffness.  LYMPHATICS: No enlarged nodes. No history of splenectomy.  PSYCHIATRIC: No history of depression or anxiety.  ENDOCRINOLOGIC: No reports of sweating, cold or heat intolerance. No polyuria or polydipsia.  Marland Kitchen   Physical Examination Today's Vitals   09/06/19 1407  BP: (!) 100/58  Pulse: 77  SpO2: 91%  Weight: (!) 396 lb 12.8 oz (180 kg)  Height: 6\' 1"  (1.854 m)   Body mass index is 52.35 kg/m.  Gen: resting comfortably, no acute distress HEENT: no scleral icterus, pupils equal round and reactive, no palptable cervical adenopathy,  CV: irreg, no m/r/g, no jvd Resp: Clear to auscultation bilaterally GI: abdomen is soft, non-tender, non-distended, normal bowel sounds, no hepatosplenomegaly MSK: extremities are warm, no edema.  Skin: warm, no rash Neuro:  no focal deficits Psych: appropriate affect   Diagnostic Studies 10/2018 echo 1. Images are limited. 2. The left ventricle has grossly normal systolic function, with an ejection fraction of 55-60%. The cavity size was normal. There is mildly increased left ventricular wall thickness. Left ventricular diastolic Doppler parameters are indeterminate. 3. The right ventricle has normal systolic function. The cavity was normal. There is no increase in right ventricular wall thickness. Right ventricular systolic pressure is normal with an estimated pressure of 12.5 mmHg. 4. The aortic valve is tricuspid. Mild calcification of the aortic valve. Mild aortic annular calcification noted. 5. The mitral valve is  grossly normal. There is mild mitral annular calcification present. 6. The tricuspid valve is grossly normal. 7. The aorta is abnormal in size and structure. 8. There is mild dilatation of the aortic root. 9. The interatrial septum was not well visualized.    Assessment and Plan   1. Aflutter/Afib - history of aflutter ablation. Also with afib previously followed by [facility] - failed sotalol, started on dofetilide. - EKG today shows SR, he has been paroxysmal on dofetilide, so far contuing but if consistent in afib over time likely would d/c tikasyn and continue rate control alone per EP recs.     2.Acute on chronic diastolic HF -weight trending down but has stalled out, ongoing LE edema - obtain BMET/Mg, if stable then increase torsemide to 80mg  in AM and 60mg  in PM       F/u 1 month    Arnoldo Lenis, M.D.

## 2019-09-07 ENCOUNTER — Ambulatory Visit: Payer: Medicare Other | Admitting: Physician Assistant

## 2019-09-07 NOTE — Progress Notes (Deleted)
Office Visit Note  Patient: Jesus Rodriguez             Date of Birth: 1950-06-26           MRN: [id number]             PCP: Patient, [name] Referring: No ref. provider found Visit Date: 09/14/2019 Occupation: @GUAROCC @  Subjective:  No chief complaint on file.   History of Present Illness: Jesus Rodriguez is a 69 y.o. male ***   Activities of Daily Living:  Patient reports morning stiffness for *** {minute/hour:19697}.   Patient {ACTIONS;DENIES/REPORTS:21021675::"Denies"} nocturnal pain.  Difficulty dressing/grooming: {ACTIONS;DENIES/REPORTS:21021675::"Denies"} Difficulty climbing stairs: {ACTIONS;DENIES/REPORTS:21021675::"Denies"} Difficulty getting out of chair: {ACTIONS;DENIES/REPORTS:21021675::"Denies"} Difficulty using hands for taps, buttons, cutlery, and/or writing: {ACTIONS;DENIES/REPORTS:21021675::"Denies"}  No Rheumatology ROS completed.   PMFS History:  Patient Active Problem List   Diagnosis Date Noted  . Atrial fibrillation ([place]) 05/11/2018  . Encounter for therapeutic drug monitoring 04/22/2018  . Persistent atrial fibrillation ([place]) 04/08/2018  . History of rheumatoid arthritis seronegative 09/10/2017  . High risk medications (not anticoagulants) long-term use 02/14/2016  . Class 3 obesity in adult 02/14/2016  . Depression 02/14/2016  . Osteoarthritis of both hands 02/13/2016  . Osteoarthritis of both knees 02/13/2016  . DDD (degenerative disc disease), lumbar 02/13/2016  . 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 Hypertension 02/13/2016  . Elevated cholesterol 02/13/2016  . Insomnia 02/13/2016  . Anxiety 02/13/2016  . Sleep apnea 02/13/2016  . Arrhythmia 02/13/2016    Past Medical History:  Diagnosis Date  . Anxiety 02/13/2016  . Atrial flutter ([place]) 02/13/2016   s/p [facility]I ablation at [facility]  . DDD (degenerative disc disease), lumbar 02/13/2016  . Elevated cholesterol 02/13/2016  . Hypertension 02/13/2016  . Insomnia 02/13/2016  . Osteoarthritis of both  hands 02/13/2016  . Osteoarthritis of both knees 02/13/2016  . Persistent atrial fibrillation ([place])   . RA (rheumatoid arthritis) ([place]) 02/13/2016   Sero Negative  . 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 Sleep apnea 02/13/2016    Family History  Problem Relation Age of Onset  . Hypertension Father   . Heart attack Father   . Cancer Father        prostate  . Hypertension Sister   . Hypertension Sister   . Hypertension Sister   . Epilepsy Son   . Diabetes Son    Past Surgical History:  Procedure Laterality Date  . A-FLUTTER ABLATION     at [facility] 2018  . CARDIOVERSION  02/10/2018  . CARDIOVERSION N/A 05/13/2018   Procedure: CARDIOVERSION;  Surgeon: Sanda Klein, MD;  [facility];  Service: Cardiovascular;  Laterality: N/A;  . CATARACT EXTRACTION     Social History   Social History Narrative  . Not on file   Immunization History  Administered Date(s) Administered  . Influenza, High Dose Seasonal PF 05/14/2018  . Influenza,inj,Quad PF,6+ Mos 01/04/2019     Objective: Vital Signs: There were no vitals taken for this visit.   Physical Exam   Musculoskeletal Exam: ***  CDAI Exam: CDAI Score: -- Patient Global: --; Provider Global: -- Swollen: --; Tender: -- Joint Exam 09/14/2019   No joint exam has been documented for this visit   There is currently no information documented on the homunculus. Go to the Rheumatology activity and complete the homunculus joint exam.  Investigation: No additional findings.  Imaging: No results found.  Recent Labs: Lab Results  Component Value Date   WBC 7.8 10/07/2018   HGB 14.8 10/07/2018  PLT 240 10/07/2018   NA 140 10/07/2018   K 4.2 10/07/2018   CL 97 (L) 10/07/2018   CO2 33 (H) 10/07/2018   GLUCOSE 115 (H) 10/07/2018   BUN 18 10/07/2018   CREATININE 0.66 (L) 10/07/2018   BILITOT 0.5 10/07/2018   ALKPHOS 86 08/20/2016   AST 13 10/07/2018   ALT 13 10/07/2018   PROT 6.9 10/07/2018   ALBUMIN 4.1 08/20/2016    CALCIUM 9.3 10/07/2018   GFRAA 115 10/07/2018    Speciality Comments: No specialty comments available.  Procedures:  No procedures performed Allergies: Celebrex [celecoxib], Methotrexate derivatives, and Sulfa antibiotics   Assessment / Plan:     Visit Diagnoses: No diagnosis found.  Orders: No orders of the defined types were placed in this encounter.  No orders of the defined types were placed in this encounter.   Face-to-face time spent with patient was *** minutes. Greater than 50% of time was spent in counseling and coordination of care.  Follow-Up Instructions: No follow-ups on file.   Ofilia Neas, PA-C  Note - This record has been created using Dragon software.  Chart creation errors have been sought, but may not always  have been located. Such creation errors do not reflect on  the standard of medical care.

## 2019-09-14 ENCOUNTER — Ambulatory Visit: Payer: Medicare Other | Admitting: Physician Assistant

## 2019-09-14 ENCOUNTER — Telehealth: Payer: Self-pay | Admitting: Rheumatology

## 2019-09-14 DIAGNOSIS — G8929 Other chronic pain: Secondary | ICD-10-CM

## 2019-09-14 DIAGNOSIS — Z5181 Encounter for therapeutic drug level monitoring: Secondary | ICD-10-CM

## 2019-09-14 NOTE — Telephone Encounter (Signed)
Patient is due to update USD and asked for order to be released to [place]. Order released. Patient will update Narcotic agreement on 10/04/2019 at appointment in office.

## 2019-09-14 NOTE — Telephone Encounter (Signed)
Patient called requesting a return call regarding his Urine drug screen test and paperwork.

## 2019-09-21 NOTE — Progress Notes (Deleted)
Office Visit Note  Patient: Jesus Rodriguez             Date of Birth: 1950/05/13           MRN: [id number]             PCP: Patient, [name] Referring: No ref. provider found Visit Date: 10/04/2019 Occupation: @GUAROCC @  Subjective:  No chief complaint on file.   History of Present Illness: Jesus Rodriguez is a 69 y.o. male ***   Activities of Daily Living:  Patient reports morning stiffness for *** {minute/hour:19697}.   Patient {ACTIONS;DENIES/REPORTS:21021675::"Denies"} nocturnal pain.  Difficulty dressing/grooming: {ACTIONS;DENIES/REPORTS:21021675::"Denies"} Difficulty climbing stairs: {ACTIONS;DENIES/REPORTS:21021675::"Denies"} Difficulty getting out of chair: {ACTIONS;DENIES/REPORTS:21021675::"Denies"} Difficulty using hands for taps, buttons, cutlery, and/or writing: {ACTIONS;DENIES/REPORTS:21021675::"Denies"}  No Rheumatology ROS completed.   PMFS History:  Patient Active Problem List   Diagnosis Date Noted  . Atrial fibrillation ([place]) 05/11/2018  . Encounter for therapeutic drug monitoring 04/22/2018  . Persistent atrial fibrillation ([place]) 04/08/2018  . History of rheumatoid arthritis seronegative 09/10/2017  . High risk medications (not anticoagulants) long-term use 02/14/2016  . Class 3 obesity in adult 02/14/2016  . Depression 02/14/2016  . Osteoarthritis of both hands 02/13/2016  . Osteoarthritis of both knees 02/13/2016  . DDD (degenerative disc disease), lumbar 02/13/2016  . 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 Hypertension 02/13/2016  . Elevated cholesterol 02/13/2016  . Insomnia 02/13/2016  . Anxiety 02/13/2016  . Sleep apnea 02/13/2016  . Arrhythmia 02/13/2016    Past Medical History:  Diagnosis Date  . Anxiety 02/13/2016  . Atrial flutter ([place]) 02/13/2016   s/p CTI ablation at [facility]  . DDD (degenerative disc disease), lumbar 02/13/2016  . Elevated cholesterol 02/13/2016  . Hypertension 02/13/2016  . Insomnia 02/13/2016  . Osteoarthritis of both  hands 02/13/2016  . Osteoarthritis of both knees 02/13/2016  . Persistent atrial fibrillation ([place])   . RA (rheumatoid arthritis) ([place]) 02/13/2016   Sero Negative  . 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 Sleep apnea 02/13/2016    Family History  Problem Relation Age of Onset  . Hypertension Father   . Heart attack Father   . Cancer Father        prostate  . Hypertension Sister   . Hypertension Sister   . Hypertension Sister   . Epilepsy Son   . Diabetes Son    Past Surgical History:  Procedure Laterality Date  . A-FLUTTER ABLATION     at [facility] 2018  . CARDIOVERSION  02/10/2018  . CARDIOVERSION N/A 05/13/2018   Procedure: CARDIOVERSION;  Surgeon: Sanda Klein, MD;  [facility]: [facility];  Service: Cardiovascular;  Laterality: N/A;  . CATARACT EXTRACTION     Social History   Social History Narrative  . Not on file   Immunization History  Administered Date(s) Administered  . Influenza, High Dose Seasonal PF 05/14/2018  . Influenza,inj,Quad PF,6+ Mos 01/04/2019     Objective: Vital Signs: There were no vitals taken for this visit.   Physical Exam   Musculoskeletal Exam: ***  CDAI Exam: CDAI Score: -- Patient Global: --; Provider Global: -- Swollen: --; Tender: -- Joint Exam 10/04/2019   No joint exam has been documented for this visit   There is currently no information documented on the homunculus. Go to the Rheumatology activity and complete the homunculus joint exam.  Investigation: No additional findings.  Imaging: No results found.  Recent Labs: Lab Results  Component Value Date   WBC 7.8 10/07/2018   HGB 14.8 10/07/2018  PLT 240 10/07/2018   NA 140 10/07/2018   K 4.2 10/07/2018   CL 97 (L) 10/07/2018   CO2 33 (H) 10/07/2018   GLUCOSE 115 (H) 10/07/2018   BUN 18 10/07/2018   CREATININE 0.66 (L) 10/07/2018   BILITOT 0.5 10/07/2018   ALKPHOS 86 08/20/2016   AST 13 10/07/2018   ALT 13 10/07/2018   PROT 6.9 10/07/2018   ALBUMIN 4.1 08/20/2016    CALCIUM 9.3 10/07/2018   GFRAA 115 10/07/2018    Speciality Comments: No specialty comments available.  Procedures:  No procedures performed Allergies: Celebrex [celecoxib], Methotrexate derivatives, and Sulfa antibiotics   Assessment / Plan:     Visit Diagnoses: No diagnosis found.  Orders: No orders of the defined types were placed in this encounter.  No orders of the defined types were placed in this encounter.   Face-to-face time spent with patient was *** minutes. Greater than 50% of time was spent in counseling and coordination of care.  Follow-Up Instructions: No follow-ups on file.   Earnestine Mealing, CMA  Note - This record has been created using Editor, commissioning.  Chart creation errors have been sought, but may not always  have been located. Such creation errors do not reflect on  the standard of medical care.

## 2019-09-22 ENCOUNTER — Telehealth: Payer: Self-pay | Admitting: Rheumatology

## 2019-09-22 MED ORDER — HYDROCODONE-ACETAMINOPHEN 5-325 MG PO TABS: 1.0000 | ORAL_TABLET | Freq: Two times a day (BID) | ORAL | 0 refills | Status: DC | PRN

## 2019-09-22 NOTE — Telephone Encounter (Signed)
Patient contacted the office requesting a refill on Hydrocodone. Patient advised he is overdue to update UDS and narcotic agreement. Patient advised he is due to update UDS every 3 months. Patient states "I was told it was every 6 months". I advised patient we have been doing it every 3 months due to him being on Hydrocodone. Patient states he will go to [facility] tomorrow to complete the UDS. Patient is out of medication.   Last Visit:03/29/19 Next Visit:09/07/2019  UDS:04/21/19 Narc Agreement:12/12/18  Last Fill: 08/19/2019  Okay to refill Hydrocodone?

## 2019-09-22 NOTE — Telephone Encounter (Signed)
Patient called requesting prescription refill of Hydrocodone to be sent to [facility] at [address]  Patient states he is out of medication and won't be able to get his UDS labwork until tomorrow due to needing a ride from his daughter.  Call was transferred to [facility].

## 2019-09-23 ENCOUNTER — Telehealth: Payer: Self-pay | Admitting: *Deleted

## 2019-09-23 DIAGNOSIS — I4891 Unspecified atrial fibrillation: Secondary | ICD-10-CM

## 2019-09-23 NOTE — Telephone Encounter (Signed)
-----   Message from Arnoldo Lenis, MD sent at 09/21/2019  1:07 PM EDT ----- Labs show very slight stress on the kidneys, would not go up on fluid pill at this time. His potassium and magnesium are up a little bit, his med list does not show he is on supplement, can we verify this  Zandra Abts MD

## 2019-09-23 NOTE — Telephone Encounter (Signed)
Pt voiced understanding and says he hasn't taken any potassium or magnesium supplements

## 2019-09-25 ENCOUNTER — Other Ambulatory Visit: Payer: Self-pay | Admitting: Cardiology

## 2019-09-26 NOTE — Telephone Encounter (Signed)
Needs repeat BMET/Mg this week please    Zandra Abts MD

## 2019-09-27 NOTE — Telephone Encounter (Signed)
Pt voiced understanding - says he will have repeat labs done [facility] tomorrow

## 2019-09-27 NOTE — Addendum Note (Signed)
Addended by: Julian Hy T on: 09/27/2019 09:08 AM   Modules accepted: Orders

## 2019-09-28 ENCOUNTER — Other Ambulatory Visit: Payer: Self-pay | Admitting: *Deleted

## 2019-09-28 ENCOUNTER — Ambulatory Visit (INDEPENDENT_AMBULATORY_CARE_PROVIDER_SITE_OTHER): Payer: Medicare Other | Admitting: *Deleted

## 2019-09-28 DIAGNOSIS — Z5181 Encounter for therapeutic drug level monitoring: Secondary | ICD-10-CM

## 2019-09-28 DIAGNOSIS — I4891 Unspecified atrial fibrillation: Secondary | ICD-10-CM

## 2019-09-28 LAB — POCT INR: INR: 2.4 (ref 2.0–3.0)

## 2019-09-28 NOTE — Patient Instructions (Signed)
Continue warfarin 1 tablet daily except 1/2 tablet on Sundays, Tuesdays and Thursdays Recheck INR in 7 weeks.

## 2019-09-28 NOTE — Addendum Note (Signed)
Addended by: Merlene Laughter on: 09/28/2019 03:22 PM   Modules accepted: Orders

## 2019-09-28 NOTE — Telephone Encounter (Signed)
Received call asking for lab orders-patient there at their facility now. [facility] orders placed.

## 2019-10-04 ENCOUNTER — Ambulatory Visit: Payer: Medicare Other | Admitting: Physician Assistant

## 2019-10-04 ENCOUNTER — Telehealth: Payer: Self-pay | Admitting: *Deleted

## 2019-10-04 NOTE — Telephone Encounter (Signed)
-----   Message from Arnoldo Lenis, MD sent at 10/04/2019 10:25 AM EDT ----- Normal labs  J BrancH MD

## 2019-10-04 NOTE — Telephone Encounter (Signed)
Pt aware - routed to pcp  

## 2019-10-07 ENCOUNTER — Ambulatory Visit: Payer: Medicare Other | Admitting: Internal Medicine

## 2019-10-09 ENCOUNTER — Other Ambulatory Visit: Payer: Self-pay | Admitting: Cardiology

## 2019-10-11 ENCOUNTER — Ambulatory Visit: Payer: Medicare Other | Admitting: Physician Assistant

## 2019-10-14 ENCOUNTER — Ambulatory Visit: Payer: Medicare Other | Admitting: Internal Medicine

## 2019-10-14 NOTE — Progress Notes (Signed)
Office Visit Note  Patient: Jesus Rodriguez             Date of Birth: 1950/06/24           MRN: [id number]             PCP: Patient, [name] Referring: No ref. provider found Visit Date: 10/25/2019 Occupation: @GUAROCC @  Subjective:  Medication monitoring   History of Present Illness: Jesus Rodriguez is a 69 y.o. male with history of osteoarthritis and DDD.  He continues to have chronic pain in multiple joints including both hands, both knee joints, and his lower back.  He states that his morning stiffness is lasting 3 to 4 hours.  He states that he has to use his Rollator walker on a daily basis to assist with ambulation.  He has been having significant difficulty with mobility.  He states that his quality of life has diminished and he is not able to leave the house much.  He states that he would like to be able to go back to baseball games or concerts but has been unable without having motorized scooter.  Activities of Daily Living:  Patient reports morning stiffness for 3-4  hours.   Patient Reports nocturnal pain.  Difficulty dressing/grooming: Reports Difficulty climbing stairs: Reports Difficulty getting out of chair: Reports Difficulty using hands for taps, buttons, cutlery, and/or writing: Denies  Review of Systems  Constitutional: Positive for fatigue. Negative for night sweats.  HENT: Positive for nose dryness. Negative for mouth sores and mouth dryness.   Eyes: Positive for dryness. Negative for redness and itching.  Respiratory: Positive for shortness of breath and difficulty breathing.   Cardiovascular: Positive for swelling in legs/feet. Negative for chest pain, palpitations, hypertension and irregular heartbeat.  Gastrointestinal: Negative for blood in stool, constipation and diarrhea.  Endocrine: Negative for increased urination.  Genitourinary: Negative for difficulty urinating and painful urination.  Musculoskeletal: Positive for arthralgias, joint pain and  morning stiffness. Negative for joint swelling, myalgias, muscle weakness, muscle tenderness and myalgias.  Skin: Negative for color change, rash, hair loss, nodules/bumps, redness, skin tightness, ulcers and sensitivity to sunlight.  Allergic/Immunologic: Negative for susceptible to infections.  Neurological: Positive for numbness and weakness. Negative for dizziness, fainting, headaches, memory loss and night sweats.  Hematological: Positive for bruising/bleeding tendency. Negative for swollen glands.  Psychiatric/Behavioral: Negative for depressed mood, confusion and sleep disturbance. The patient is not nervous/anxious.     PMFS History:  Patient Active Problem List   Diagnosis Date Noted  . Atrial fibrillation ([place]) 05/11/2018  . Encounter for therapeutic drug monitoring 04/22/2018  . Persistent atrial fibrillation ([place]) 04/08/2018  . History of rheumatoid arthritis seronegative 09/10/2017  . High risk medications (not anticoagulants) long-term use 02/14/2016  . Class 3 obesity in adult 02/14/2016  . Depression 02/14/2016  . Osteoarthritis of both hands 02/13/2016  . Osteoarthritis of both knees 02/13/2016  . DDD (degenerative disc disease), lumbar 02/13/2016  . 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 Hypertension 02/13/2016  . Elevated cholesterol 02/13/2016  . Insomnia 02/13/2016  . Anxiety 02/13/2016  . Sleep apnea 02/13/2016  . Arrhythmia 02/13/2016    Past Medical History:  Diagnosis Date  . Anxiety 02/13/2016  . Atrial flutter ([place]) 02/13/2016   s/p [facility]I ablation at [facility]  . DDD (degenerative disc disease), lumbar 02/13/2016  . Elevated cholesterol 02/13/2016  . Hypertension 02/13/2016  . Insomnia 02/13/2016  . Osteoarthritis of both hands 02/13/2016  . Osteoarthritis of both knees 02/13/2016  .  Persistent atrial fibrillation ([place])   . RA (rheumatoid arthritis) ([place]) 02/13/2016   Sero Negative  . 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 Sleep apnea 02/13/2016    Family History    Problem Relation Age of Onset  . Hypertension Father   . Heart attack Father   . Cancer Father        prostate  . Hypertension Sister   . Hypertension Sister   . Hypertension Sister   . Epilepsy Son   . Diabetes Son    Past Surgical History:  Procedure Laterality Date  . A-FLUTTER ABLATION     at [facility] 2018  . CARDIOVERSION  02/10/2018  . CARDIOVERSION N/A 05/13/2018   Procedure: CARDIOVERSION;  Surgeon: Sanda Klein, MD;  Location: [facility] END[facility]COPY;  Service: Cardiovascular;  Laterality: N/A;  . CATARACT EXTRACTION     Social History   Social History Narrative  . Not on file   Immunization History  Administered Date(s) Administered  . Influenza, High Dose Seasonal PF 05/14/2018  . Influenza,inj,Quad PF,6+ Mos 01/04/2019     Objective: Vital Signs: BP 133/72 (BP Location: Left Arm, Patient Position: Sitting, Cuff Size: Normal)   Pulse 80   Resp 19   Ht 6\' 1"  (1.854 m)   Wt (!) 399 lb (181 kg)   BMI 52.64 kg/m    Physical Exam Vitals and nursing note reviewed.  Constitutional:      Appearance: He is well-developed.  HENT:     Head: Normocephalic and atraumatic.  Eyes:     Conjunctiva/sclera: Conjunctivae normal.     Pupils: Pupils are equal, round, and reactive to light.  Pulmonary:     Effort: Pulmonary effort is normal.  Abdominal:     General: Bowel sounds are normal.     Palpations: Abdomen is soft.  Musculoskeletal:     Cervical back: Normal range of motion and neck supple.  Skin:    General: Skin is warm and dry.     Capillary Refill: Capillary refill takes less than 2 seconds.  Neurological:     Mental Status: He is alert and oriented to person, place, and time.  Psychiatric:        Behavior: Behavior normal.      Musculoskeletal Exam: C-spine limited range of motion with lateral rotation bilaterally. Thoracic kyphosis noted.  Lumbar spine difficult to assess wall sitting on Rollator walker seat.  Shoulder joints have full range of motion with no  discomfort.  Olecranon bursitis of right elbow noted.  No tenderness, fluctuance, or surrounding erythema was noted.  Left elbow has full range of motion with no tenderness or inflammation.  Wrist joints, MCPs, PIPs, DIPs good range of motion with no synovitis.  He is able to make a complete fist bilaterally.  Both knee joints are warm and have painful range of motion.  Bilateral knee crepitus noted.  No ankle joint tenderness.  Pedal edema noted bilaterally.  CDAI Exam: CDAI Score: -- Patient Global: --; Provider Global: -- Swollen: --; Tender: -- Joint Exam 10/25/2019   No joint exam has been documented for this visit   There is currently no information documented on the homunculus. Go to the Rheumatology activity and complete the homunculus joint exam.  Investigation: No additional findings.  Imaging: No results found.  Recent Labs: Lab Results  Component Value Date   WBC 7.8 10/07/2018   HGB 14.8 10/07/2018   PLT 240 10/07/2018   NA 140 10/07/2018   K 4.2 10/07/2018   CL 97 (  L) 10/07/2018   CO2 33 (H) 10/07/2018   GLUCOSE 115 (H) 10/07/2018   BUN 18 10/07/2018   CREATININE 0.66 (L) 10/07/2018   BILITOT 0.5 10/07/2018   ALKPHOS 86 08/20/2016   AST 13 10/07/2018   ALT 13 10/07/2018   PROT 6.9 10/07/2018   ALBUMIN 4.1 08/20/2016   CALCIUM 9.3 10/07/2018   GFRAA 115 10/07/2018    Speciality Comments: No specialty comments available.  Procedures:  No procedures performed Allergies: Celebrex [celecoxib], Methotrexate derivatives, and Sulfa antibiotics   Assessment / Plan:     Visit Diagnoses: Primary osteoarthritis of both hands: He has PIP and DIP thickening consistent with osteoarthritis of both hands.  He has no tenderness or inflammation on exam today.  He has complete fist formation bilaterally.  He experiences stiffness in both hands first thing in the morning which improves with hand exercises.  He has some difficulty using his walker when he experiences increased  pain and stiffness in his hands.  We discussed the importance of joint protection and muscle strengthening.  He was advised to notify us if he develops increased joint pain or joint swelling in his hands.  Primary osteoarthritis of both knees: Chronic pain.  He has severe pain in both knee joints.  He is unable to ambulate without assistance.  He has been using a Rollator walker but spends most of the day in a seated position.  His lack of mobility has been affecting his quality of life.  He has been unable to perform activities that used to bring him joy such as going to sporting events in a concert.  He has an upcoming concert with his daughter this fall and does not think he will be able to attend due to his difficulty ambulating.  We discussed getting an electric bariatric motorized scooter to assist him.  A prescription was provided to the patient today to try to get insurance to approve.  He will continue to take hydrocodone as needed for pain relief.  UDS and narcotic agreement were updated today on 10/25/2019.  History of rheumatoid arthritis seronegative: In remission.  He is not currently taking any immunosuppressive agents.  He continues to have joint stiffness lasting about 3 to 4 hours but has not had any joint swelling.  He has chronic pain and warmth in both knee joints due to severe osteoarthritis in both knees.  He takes hydrocodone twice daily for pain relief.  He was advised to notify us if he develops increased joint inflammation.  Hyperuricemia: Uric acid was 8.3 on 10/07/2018.  He has not taking a urate lowering medication at this time.  DDD (degenerative disc disease), lumbar: Chronic pain.  He experiences severe intermittent lower back pain.  He is unable to stand or walk for more than a few seconds without severe discomfort in lower extremity weakness.  He uses a Rollator walker to assist but has been mainly immobile.  He was provided a prescription for an electric bariatric motorized  scooter to hopefully help with increasing his mobility and improving his quality of life.  Medication monitoring encounter -Hydrocodone twice daily for pain relief.  UDS and narcotic agreement were updated today on 10/25/2019.  We discussed the importance of updating his UDS every 3 months.  He voiced understanding.  Plan: DRUG MONITOR, PANEL 5, W/CONF, URINE  Encounter for chronic pain management -He updated his UDS and narcotic agreement today on 10/25/2019.  Plan: DRUG MONITOR, PANEL 5, W/CONF, URINE  Olecranon bursitis, right elbow: On  examination he was found to have olecranon bursitis of the right elbow.  He was unaware of the inflammation due to not experiencing any discomfort or tenderness.  He has no surrounding erythema or fluctuance.  He has no tenderness to palpation.  We discussed that there is a high risk of infection and since he is on warfarin we are concerned about his bleeding risk if we aspirate the bursa.  If he develops increased pain, inflammation, or surrounding erythema he will contact us and we will perform an aspiration at that time.  We discussed trying to avoid compression of the right elbow on his Rollator walker or chairs at home.  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 DRUG MONITOR, PANEL 5, W/CONF, URINE   Meds ordered this encounter  Medications  . HYDROcodone-acetaminophen (NORCO/VICODIN) 5-325 MG tablet    Sig: Take 1 tablet by mouth 2 (two) times daily as needed for moderate pain.    Dispense:  60 tablet    Refill:  0      Follow-Up Instructions: Return in about 6 months (around 04/26/2020) for Osteoarthritis, DDD.   Ofilia Neas,  PA-C  Note - This record has been created using Dragon software.  Chart creation errors have been sought, but may not always  have been located. Such creation errors do not reflect on  the standard of medical care.

## 2019-10-15 ENCOUNTER — Other Ambulatory Visit: Payer: Self-pay | Admitting: Cardiology

## 2019-10-18 ENCOUNTER — Ambulatory Visit: Payer: Medicare Other | Admitting: Cardiology

## 2019-10-18 ENCOUNTER — Other Ambulatory Visit: Payer: Self-pay | Admitting: Internal Medicine

## 2019-10-25 ENCOUNTER — Ambulatory Visit: Payer: Medicare Other | Admitting: Physician Assistant

## 2019-10-25 ENCOUNTER — Encounter: Payer: Self-pay | Admitting: Physician Assistant

## 2019-10-25 ENCOUNTER — Other Ambulatory Visit: Payer: Self-pay

## 2019-10-25 VITALS — BP 133/72 | HR 80 | Resp 19 | Ht 73.0 in | Wt 399.0 lb

## 2019-10-25 DIAGNOSIS — M19042 Primary osteoarthritis, left hand: Secondary | ICD-10-CM

## 2019-10-25 DIAGNOSIS — M7021 Olecranon bursitis, right elbow: Secondary | ICD-10-CM

## 2019-10-25 DIAGNOSIS — C632 Malignant neoplasm of scrotum: Secondary | ICD-10-CM

## 2019-10-25 DIAGNOSIS — Z8739 Personal history of other diseases of the musculoskeletal system and connective tissue: Secondary | ICD-10-CM | POA: Diagnosis not present

## 2019-10-25 DIAGNOSIS — Z8669 Personal history of other diseases of the nervous system and sense organs: Secondary | ICD-10-CM

## 2019-10-25 DIAGNOSIS — Z8639 Personal history of other endocrine, nutritional and metabolic disease: Secondary | ICD-10-CM

## 2019-10-25 DIAGNOSIS — M5136 Other intervertebral disc degeneration, lumbar region: Secondary | ICD-10-CM

## 2019-10-25 DIAGNOSIS — M51369 Other intervertebral disc degeneration, lumbar region without mention of lumbar back pain or lower extremity pain: Secondary | ICD-10-CM

## 2019-10-25 DIAGNOSIS — M170 Bilateral primary osteoarthritis of knee: Secondary | ICD-10-CM | POA: Diagnosis not present

## 2019-10-25 DIAGNOSIS — Z87898 Personal history of other specified conditions: Secondary | ICD-10-CM

## 2019-10-25 DIAGNOSIS — Z8679 Personal history of other diseases of the circulatory system: Secondary | ICD-10-CM

## 2019-10-25 DIAGNOSIS — E790 Hyperuricemia without signs of inflammatory arthritis and tophaceous disease: Secondary | ICD-10-CM | POA: Diagnosis not present

## 2019-10-25 DIAGNOSIS — Z5181 Encounter for therapeutic drug level monitoring: Secondary | ICD-10-CM

## 2019-10-25 DIAGNOSIS — Z8659 Personal history of other mental and behavioral disorders: Secondary | ICD-10-CM

## 2019-10-25 DIAGNOSIS — G8929 Other chronic pain: Secondary | ICD-10-CM

## 2019-10-25 DIAGNOSIS — M19041 Primary osteoarthritis, right hand: Secondary | ICD-10-CM | POA: Diagnosis not present

## 2019-10-25 DIAGNOSIS — I499 Cardiac arrhythmia, unspecified: Secondary | ICD-10-CM

## 2019-10-25 MED ORDER — HYDROCODONE-ACETAMINOPHEN 5-325 MG PO TABS: 1.0000 | ORAL_TABLET | Freq: Two times a day (BID) | ORAL | 0 refills | Status: DC | PRN

## 2019-10-29 LAB — DRUG MONITOR, PANEL 5, W/CONF, URINE
Amphetamines: NEGATIVE ng/mL (ref <500)
Barbiturates: NEGATIVE ng/mL (ref <300)
Benzodiazepines: NEGATIVE ng/mL (ref <100)
Cocaine Metabolite: NEGATIVE ng/mL (ref <150)
Codeine: NEGATIVE ng/mL (ref <50)
Creatinine: 209.4 mg/dL
Hydrocodone: 243 ng/mL — ABNORMAL HIGH (ref <50)
Hydromorphone: 221 ng/mL — ABNORMAL HIGH (ref <50)
Marijuana Metabolite: NEGATIVE ng/mL (ref <20)
Methadone Metabolite: NEGATIVE ng/mL (ref <100)
Morphine: NEGATIVE ng/mL (ref <50)
Norhydrocodone: 394 ng/mL — ABNORMAL HIGH (ref <50)
Opiates: POSITIVE ng/mL — AB (ref <100)
Oxidant: NEGATIVE ug/mL
Oxycodone: NEGATIVE ng/mL (ref <100)
pH: 6.4 (ref 4.5–9.0)

## 2019-10-29 LAB — DM TEMPLATE

## 2019-10-31 NOTE — Progress Notes (Signed)
UDS is consistent with treatment. Repeat UDS is in 3 months.

## 2019-11-07 ENCOUNTER — Other Ambulatory Visit: Payer: Self-pay | Admitting: Cardiology

## 2019-11-12 ENCOUNTER — Other Ambulatory Visit: Payer: Self-pay | Admitting: Cardiology

## 2019-11-18 ENCOUNTER — Ambulatory Visit: Payer: Medicare Other | Admitting: Internal Medicine

## 2019-11-21 ENCOUNTER — Telehealth: Payer: Self-pay | Admitting: Cardiology

## 2019-11-21 NOTE — Telephone Encounter (Signed)
°  Patient Consent for Virtual Visit         Jesus Rodriguez has provided verbal consent on 11/21/2019 for a virtual visit (video or telephone).   CONSENT FOR VIRTUAL VISIT FOR:  Jesus Rodriguez  By participating in this virtual visit I agree to the following:  I hereby voluntarily request, consent and authorize [facility] and its employed or contracted physicians, physician assistants, nurse practitioners or other licensed health care professionals (the Practitioner), to provide me with telemedicine health care services (the Services") as deemed necessary by the treating Practitioner. I acknowledge and consent to receive the Services by the Practitioner via telemedicine. I understand that the telemedicine visit will involve communicating with the Practitioner through live audiovisual communication technology and the disclosure of certain medical information by electronic transmission. I acknowledge that I have been given the opportunity to request an in-person assessment or other available alternative prior to the telemedicine visit and am voluntarily participating in the telemedicine visit.  I understand that I have the right to withhold or withdraw my consent to the use of telemedicine in the course of my care at any time, without affecting my right to future care or treatment, and that the Practitioner or I may terminate the telemedicine visit at any time. I understand that I have the right to inspect all information obtained and/or recorded in the course of the telemedicine visit and may receive copies of available information for a reasonable fee.  I understand that some of the potential risks of receiving the Services via telemedicine include:   Delay or interruption in medical evaluation due to technological equipment failure or disruption;  Information transmitted may not be sufficient (e.g. poor resolution of images) to allow for appropriate medical decision making by the Practitioner;  and/or   In rare instances, security protocols could fail, causing a breach of personal health information.  Furthermore, I acknowledge that it is my responsibility to provide information about my medical history, conditions and care that is complete and accurate to the best of my ability. I acknowledge that Practitioner's advice, recommendations, and/or decision may be based on factors not within their control, such as incomplete or inaccurate data provided by me or distortions of diagnostic images or specimens that may result from electronic transmissions. I understand that the practice of medicine is not an exact science and that Practitioner makes no warranties or guarantees regarding treatment outcomes. I acknowledge that a copy of this consent can be made available to me via my patient portal ([facility]), or I can request a printed copy by calling the office of [facility].    I understand that my insurance will be billed for this visit.   I have read or had this consent read to me.  I understand the contents of this consent, which adequately explains the benefits and risks of the Services being provided via telemedicine.   I have been provided ample opportunity to ask questions regarding this consent and the Services and have had my questions answered to my satisfaction.  I give my informed consent for the services to be provided through the use of telemedicine in my medical care

## 2019-11-22 ENCOUNTER — Encounter: Payer: Self-pay | Admitting: Cardiology

## 2019-11-22 ENCOUNTER — Telehealth (INDEPENDENT_AMBULATORY_CARE_PROVIDER_SITE_OTHER): Payer: Medicare Other | Admitting: Cardiology

## 2019-11-22 VITALS — HR 73 | Ht 73.0 in | Wt 388.0 lb

## 2019-11-22 DIAGNOSIS — I5032 Chronic diastolic (congestive) heart failure: Secondary | ICD-10-CM

## 2019-11-22 NOTE — Progress Notes (Signed)
Virtual Visit via Telephone Note   This visit type was conducted due to national recommendations for restrictions regarding the COVID-19 Pandemic (e.g. social distancing) in an effort to limit this patient's exposure and mitigate transmission in our community.  Due to his co-morbid illnesses, this patient is at least at moderate risk for complications without adequate follow up.  This format is felt to be most appropriate for this patient at this time.  The patient did not have access to video technology/had technical difficulties with video requiring transitioning to audio format only (telephone).  All issues noted in this document were discussed and addressed.  No physical exam could be performed with this format.  Please refer to the patient's chart for his  consent to telehealth for [facility].    Date:  11/22/2019   ID:  Jesus Rodriguez, DOB [birth year], MRN [id number] The patient was identified using 2 identifiers.  Patient Location: Home Provider Location: Office/Clinic  PCP:  Patient, [name]  Cardiologist:  Carlyle Dolly, MD  Electrophysiologist:  Thompson Grayer, MD   Evaluation Performed:  Follow-Up Visit  Chief Complaint:  Follow up  History of Present Illness:    Jesus Rodriguez is a 69 y.o. male seen today for follow up of the following medical problems.   1. Afib and flutter - notes indicate history of typical flutter s/p CTI ablation 03/2016 - history of afib Preiviously followed by [facility] - failed sotalol, started on dofetilide, followed by EP Dr Rayann Heman    - some recent palpitations. - HRs 60s and 70s   2.Chronic diastolic HF -12/3788 echo LVEF 55-60%,   - taking torsemide 60mg  bid - office weight 09/06/19 was 396 lbs.  - home weight 388-393 lbs - compliant with diuretic   3. COPD -followed by pulmonary [place]   4. OSA  on cpap - followed by pulmonary [place]   The patient does not have symptoms concerning for COVID-19  infection (fever, chills, cough, or new shortness of breath).    Past Medical History:  Diagnosis Date  . Anxiety 02/13/2016  . Atrial flutter ([place]) 02/13/2016   s/p CTI ablation at [facility]  . DDD (degenerative disc disease), lumbar 02/13/2016  . Elevated cholesterol 02/13/2016  . Hypertension 02/13/2016  . Insomnia 02/13/2016  . Osteoarthritis of both hands 02/13/2016  . Osteoarthritis of both knees 02/13/2016  . Persistent atrial fibrillation ([place])   . RA (rheumatoid arthritis) ([place]) 02/13/2016   Sero Negative  . 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 Sleep apnea 02/13/2016   Past Surgical History:  Procedure Laterality Date  . A-FLUTTER ABLATION     at [facility] 2018  . CARDIOVERSION  02/10/2018  . CARDIOVERSION N/A 05/13/2018   Procedure: CARDIOVERSION;  Surgeon: Sanda Klein, MD;  Location: [facility] END[facility];  Service: Cardiovascular;  Laterality: N/A;  . CATARACT EXTRACTION       Current Meds  Medication Sig  . acetaminophen (TYLENOL) 500 MG tablet Take by mouth as needed.   . benazepril (LOTENSIN) 20 MG tablet Take 1 tablet (20 mg total) by mouth daily.  Marland Kitchen dofetilide (TIKOSYN) 500 MCG capsule TAKE 1 CAPSULE BY MOUTH TWICE A DAY  . HYDROcodone-acetaminophen (NORCO/VICODIN) 5-325 MG tablet Take 1 tablet by mouth 2 (two) times daily as needed for moderate pain.  . INCRUSE ELLIPTA 62.5 MCG/INH AEPB Inhale 1 puff into the lungs daily.  . metoprolol tartrate (LOPRESSOR) 25 MG tablet TAKE 1 TABLET BY MOUTH TWICE A DAY  . sildenafil (VIAGRA) 100 MG tablet TAKE  1/2 TABLET 30 MINS BEFORE  . simvastatin (ZOCOR) 20 MG tablet TAKE 1 TABLET (20 MG TOTAL) BY MOUTH DAILY AT 6 PM  . tamsulosin (FLOMAX) 0.4 MG CAPS capsule Take by mouth daily.  Marland Kitchen torsemide (DEMADEX) 20 MG tablet TAKE 2 TABLETS (40 MG TOTAL) BY MOUTH 2 (TWO) TIMES DAILY. (Patient taking differently: 60 mg 2 (two) times daily. )  . warfarin (COUMADIN) 10 MG tablet TAKE 1/2 TO 1 TABLET DAILY AS DIRECTED BY COUMADIN CLINIC.     Allergies:    Celebrex [celecoxib], Methotrexate derivatives, and Sulfa antibiotics   Social History   Tobacco Use  . Smoking status: Current Every Day Smoker    Packs/day: 2.00    Years: 35.00    Pack years: 70.00    Types: Cigarettes  . Smokeless tobacco: Never Used  Vaping Use  . Vaping Use: Never used  Substance Use Topics  . Alcohol use: No  . Drug use: No     Family Hx: The patient's family history includes Cancer in his father; Diabetes in his son; Epilepsy in his son; Heart attack in his father; Hypertension in his father, sister, sister, and sister.  ROS:   Please see the history of present illness.     All other systems reviewed and are negative.   Prior CV studies:   The following studies were reviewed today:  10/2018 echo 1. Images are limited. 2. The left ventricle has grossly normal systolic function, with an ejection fraction of 55-60%. The cavity size was normal. There is mildly increased left ventricular wall thickness. Left ventricular diastolic Doppler parameters are indeterminate. 3. The right ventricle has normal systolic function. The cavity was normal. There is no increase in right ventricular wall thickness. Right ventricular systolic pressure is normal with an estimated pressure of 12.5 mmHg. 4. The aortic valve is tricuspid. Mild calcification of the aortic valve. Mild aortic annular calcification noted. 5. The mitral valve is grossly normal. There is mild mitral annular calcification present. 6. The tricuspid valve is grossly normal. 7. The aorta is abnormal in size and structure. 8. There is mild dilatation of the aortic root. 9. The interatrial septum was not well visualized.  Labs/Other Tests and Data Reviewed:    EKG:  No ECG reviewed.  Recent Labs: No results found for requested labs within last 8760 hours.   Recent Lipid Panel No results found for: CHOL, TRIG, HDL, CHOLHDL, LDLCALC, LDLDIRECT  Wt Readings from Last 3 Encounters:  11/22/19  (!) 388 lb (176 kg)  10/25/19 (!) 399 lb (181 kg)  09/06/19 (!) 396 lb 12.8 oz (180 kg)     Objective:    Vital Signs:  Pulse 73   Ht 6\' 1"  (1.854 m)   Wt (!) 388 lb (176 kg)   BMI 51.19 kg/m    Normal affect. Normal speech pattern and tone. Comfortable, no apparent distress. No audible signs of sob or wheezing  ASSESSMENT & PLAN:     1. Chronic diastolic HF -weights down from last visit, no significant symptoms. Continue current meds   COVID-19 Education: The signs and symptoms of COVID-19 were discussed with the patient and how to seek care for testing (follow up with PCP or arrange E-visit).  The importance of social distancing was discussed today.  Time:   Today, I have spent 13 minutes with the patient with telehealth technology discussing the above problems.     Medication Adjustments/Labs and Tests Ordered: Current medicines are reviewed at length with the  patient today.  Concerns regarding medicines are outlined above.   Tests Ordered: No orders of the defined types were placed in this encounter.   Medication Changes: No orders of the defined types were placed in this encounter.   Follow Up:  In Person in 4 month(s)   Signed, Carlyle Dolly, MD  11/22/2019 11:38 AM    [place]

## 2019-11-23 NOTE — Patient Instructions (Signed)
Your physician recommends that you schedule a follow-up appointment in: 4 MONTHS WITH DR BRANCH  Your physician recommends that you continue on your current medications as directed. Please refer to the Current Medication list given to you today.  Thank you for choosing [facility]!!    

## 2019-11-24 ENCOUNTER — Other Ambulatory Visit: Payer: Self-pay

## 2019-11-24 MED ORDER — HYDROCODONE-ACETAMINOPHEN 5-325 MG PO TABS: 1.0000 | ORAL_TABLET | Freq: Two times a day (BID) | ORAL | 0 refills | Status: DC | PRN

## 2019-11-24 NOTE — Telephone Encounter (Signed)
Patient left voicemail requesting a refill of hydrocodone.   Last Visit: 10/25/2019 Next Visit: 04/24/2020 UDS:10/25/2019 c/w Narc Agreement: 10/25/2019  Last Fill: 10/25/2019   Okay to refill hydrocodone?

## 2019-11-27 ENCOUNTER — Inpatient Hospital Stay (HOSPITAL_COMMUNITY)
Admission: EM | Admit: 2019-11-27 | Discharge: 2019-11-30 | DRG: 291 | Disposition: A | Discharge status: Home Health | Payer: Medicare Other | Attending: Internal Medicine | Admitting: Internal Medicine

## 2019-11-27 ENCOUNTER — Emergency Department (HOSPITAL_COMMUNITY): Payer: Medicare Other

## 2019-11-27 ENCOUNTER — Encounter (HOSPITAL_COMMUNITY): Payer: Self-pay | Admitting: Internal Medicine

## 2019-11-27 ENCOUNTER — Other Ambulatory Visit: Payer: Self-pay

## 2019-11-27 DIAGNOSIS — M069 Rheumatoid arthritis, unspecified: Secondary | ICD-10-CM | POA: Diagnosis present

## 2019-11-27 DIAGNOSIS — M19042 Primary osteoarthritis, left hand: Secondary | ICD-10-CM | POA: Diagnosis present

## 2019-11-27 DIAGNOSIS — F17210 Nicotine dependence, cigarettes, uncomplicated: Secondary | ICD-10-CM | POA: Diagnosis present

## 2019-11-27 DIAGNOSIS — J9811 Atelectasis: Secondary | ICD-10-CM | POA: Diagnosis present

## 2019-11-27 DIAGNOSIS — F419 Anxiety disorder, unspecified: Secondary | ICD-10-CM | POA: Diagnosis present

## 2019-11-27 DIAGNOSIS — I2699 Other pulmonary embolism without acute cor pulmonale: Secondary | ICD-10-CM | POA: Diagnosis present

## 2019-11-27 DIAGNOSIS — R0603 Acute respiratory distress: Secondary | ICD-10-CM

## 2019-11-27 DIAGNOSIS — I483 Typical atrial flutter: Secondary | ICD-10-CM | POA: Diagnosis present

## 2019-11-27 DIAGNOSIS — Z820 Family history of epilepsy and other diseases of the nervous system: Secondary | ICD-10-CM

## 2019-11-27 DIAGNOSIS — G4730 Sleep apnea, unspecified: Secondary | ICD-10-CM | POA: Diagnosis present

## 2019-11-27 DIAGNOSIS — I110 Hypertensive heart disease with heart failure: Principal | ICD-10-CM | POA: Diagnosis present

## 2019-11-27 DIAGNOSIS — G4733 Obstructive sleep apnea (adult) (pediatric): Secondary | ICD-10-CM | POA: Diagnosis not present

## 2019-11-27 DIAGNOSIS — Z888 Allergy status to other drugs, medicaments and biological substances status: Secondary | ICD-10-CM

## 2019-11-27 DIAGNOSIS — I4819 Other persistent atrial fibrillation: Secondary | ICD-10-CM | POA: Diagnosis present

## 2019-11-27 DIAGNOSIS — I5033 Acute on chronic diastolic (congestive) heart failure: Secondary | ICD-10-CM | POA: Diagnosis present

## 2019-11-27 DIAGNOSIS — J441 Chronic obstructive pulmonary disease with (acute) exacerbation: Secondary | ICD-10-CM | POA: Diagnosis not present

## 2019-11-27 DIAGNOSIS — E662 Morbid (severe) obesity with alveolar hypoventilation: Secondary | ICD-10-CM | POA: Diagnosis present

## 2019-11-27 DIAGNOSIS — E876 Hypokalemia: Secondary | ICD-10-CM | POA: Diagnosis present

## 2019-11-27 DIAGNOSIS — Z8549 Personal history of malignant neoplasm of other male genital organs: Secondary | ICD-10-CM

## 2019-11-27 DIAGNOSIS — I509 Heart failure, unspecified: Secondary | ICD-10-CM

## 2019-11-27 DIAGNOSIS — I272 Pulmonary hypertension, unspecified: Secondary | ICD-10-CM | POA: Diagnosis present

## 2019-11-27 DIAGNOSIS — L89301 Pressure ulcer of unspecified buttock, stage 1: Secondary | ICD-10-CM | POA: Diagnosis not present

## 2019-11-27 DIAGNOSIS — J9601 Acute respiratory failure with hypoxia: Secondary | ICD-10-CM | POA: Diagnosis not present

## 2019-11-27 DIAGNOSIS — N400 Enlarged prostate without lower urinary tract symptoms: Secondary | ICD-10-CM | POA: Diagnosis present

## 2019-11-27 DIAGNOSIS — E66813 Obesity, class 3: Secondary | ICD-10-CM

## 2019-11-27 DIAGNOSIS — L89302 Pressure ulcer of unspecified buttock, stage 2: Secondary | ICD-10-CM | POA: Diagnosis present

## 2019-11-27 DIAGNOSIS — Z8249 Family history of ischemic heart disease and other diseases of the circulatory system: Secondary | ICD-10-CM

## 2019-11-27 DIAGNOSIS — R0600 Dyspnea, unspecified: Secondary | ICD-10-CM

## 2019-11-27 DIAGNOSIS — M19041 Primary osteoarthritis, right hand: Secondary | ICD-10-CM | POA: Diagnosis present

## 2019-11-27 DIAGNOSIS — E782 Mixed hyperlipidemia: Secondary | ICD-10-CM | POA: Diagnosis present

## 2019-11-27 DIAGNOSIS — L8990 Pressure ulcer of unspecified site, unspecified stage: Secondary | ICD-10-CM | POA: Insufficient documentation

## 2019-11-27 DIAGNOSIS — J471 Bronchiectasis with (acute) exacerbation: Secondary | ICD-10-CM | POA: Diagnosis present

## 2019-11-27 DIAGNOSIS — J90 Pleural effusion, not elsewhere classified: Secondary | ICD-10-CM | POA: Diagnosis present

## 2019-11-27 DIAGNOSIS — I4891 Unspecified atrial fibrillation: Secondary | ICD-10-CM | POA: Diagnosis present

## 2019-11-27 DIAGNOSIS — J9621 Acute and chronic respiratory failure with hypoxia: Secondary | ICD-10-CM | POA: Diagnosis present

## 2019-11-27 DIAGNOSIS — Z6841 Body Mass Index (BMI) 40.0 and over, adult: Secondary | ICD-10-CM | POA: Diagnosis not present

## 2019-11-27 DIAGNOSIS — I4811 Longstanding persistent atrial fibrillation: Secondary | ICD-10-CM | POA: Diagnosis not present

## 2019-11-27 DIAGNOSIS — Z882 Allergy status to sulfonamides status: Secondary | ICD-10-CM

## 2019-11-27 DIAGNOSIS — J984 Other disorders of lung: Secondary | ICD-10-CM | POA: Diagnosis present

## 2019-11-27 DIAGNOSIS — Z7901 Long term (current) use of anticoagulants: Secondary | ICD-10-CM

## 2019-11-27 DIAGNOSIS — I10 Essential (primary) hypertension: Secondary | ICD-10-CM | POA: Diagnosis present

## 2019-11-27 DIAGNOSIS — Z20822 Contact with and (suspected) exposure to covid-19: Secondary | ICD-10-CM | POA: Diagnosis present

## 2019-11-27 DIAGNOSIS — Z833 Family history of diabetes mellitus: Secondary | ICD-10-CM

## 2019-11-27 DIAGNOSIS — R9431 Abnormal electrocardiogram [ECG] [EKG]: Secondary | ICD-10-CM | POA: Diagnosis not present

## 2019-11-27 LAB — COMPREHENSIVE METABOLIC PANEL
ALT: 10 U/L (ref 0–44)
ALT: 10 U/L (ref 0–44)
AST: 14 U/L — ABNORMAL LOW (ref 15–41)
AST: 15 U/L (ref 15–41)
Albumin: 3.2 g/dL — ABNORMAL LOW (ref 3.5–5.0)
Albumin: 3.1 g/dL — ABNORMAL LOW (ref 3.5–5.0)
Alkaline Phosphatase: 65 U/L (ref 38–126)
Alkaline Phosphatase: 65 U/L (ref 38–126)
Anion gap: 12 (ref 5–15)
Anion gap: 13 (ref 5–15)
BUN: 16 mg/dL (ref 8–23)
BUN: 15 mg/dL (ref 8–23)
CO2: 41 mmol/L — ABNORMAL HIGH (ref 22–32)
CO2: 37 mmol/L — ABNORMAL HIGH (ref 22–32)
Calcium: 8.5 mg/dL — ABNORMAL LOW (ref 8.9–10.3)
Calcium: 8.1 mg/dL — ABNORMAL LOW (ref 8.9–10.3)
Chloride: 85 mmol/L — ABNORMAL LOW (ref 98–111)
Chloride: 87 mmol/L — ABNORMAL LOW (ref 98–111)
Creatinine, Ser: 0.77 mg/dL (ref 0.61–1.24)
Creatinine, Ser: 0.76 mg/dL (ref 0.61–1.24)
GFR calc Af Amer: >60 mL/min (ref >60)
GFR calc Af Amer: >60 mL/min (ref >60)
GFR calc non Af Amer: >60 mL/min (ref >60)
GFR calc non Af Amer: >60 mL/min (ref >60)
Glucose, Bld: 116 mg/dL — ABNORMAL HIGH (ref 70–99)
Glucose, Bld: 151 mg/dL — ABNORMAL HIGH (ref 70–99)
Potassium: 2.8 mmol/L — ABNORMAL LOW (ref 3.5–5.1)
Potassium: 3.2 mmol/L — ABNORMAL LOW (ref 3.5–5.1)
Sodium: 138 mmol/L (ref 135–145)
Sodium: 137 mmol/L (ref 135–145)
Total Bilirubin: 1 mg/dL (ref 0.3–1.2)
Total Bilirubin: 1.1 mg/dL (ref 0.3–1.2)
Total Protein: 7 g/dL (ref 6.5–8.1)
Total Protein: 6.9 g/dL (ref 6.5–8.1)

## 2019-11-27 LAB — BLOOD GAS, ARTERIAL
Acid-Base Excess: 17.1 mmol/L — ABNORMAL HIGH (ref 0.0–2.0)
Bicarbonate: 37.8 mmol/L — ABNORMAL HIGH (ref 20.0–28.0)
FIO2: 40
O2 Saturation: 98.2 %
Patient temperature: 37.4
pCO2 arterial: 81.6 mmHg (ref 32.0–48.0)
pH, Arterial: 7.348 — ABNORMAL LOW (ref 7.350–7.450)
pO2, Arterial: 115 mmHg — ABNORMAL HIGH (ref 83.0–108.0)

## 2019-11-27 LAB — CBC WITH DIFFERENTIAL/PLATELET
Abs Immature Granulocytes: 0.03 10*3/uL (ref 0.00–0.07)
Abs Immature Granulocytes: 0.03 10*3/uL (ref 0.00–0.07)
Basophils Absolute: 0.1 10*3/uL (ref 0.0–0.1)
Basophils Absolute: 0.1 10*3/uL (ref 0.0–0.1)
Basophils Relative: 1 %
Basophils Relative: 1 %
Eosinophils Absolute: 0.2 10*3/uL (ref 0.0–0.5)
Eosinophils Absolute: 0 10*3/uL (ref 0.0–0.5)
Eosinophils Relative: 2 %
Eosinophils Relative: 0 %
HCT: 45.5 % (ref 39.0–52.0)
HCT: 45.1 % (ref 39.0–52.0)
Hemoglobin: 14.2 g/dL (ref 13.0–17.0)
Hemoglobin: 13.9 g/dL (ref 13.0–17.0)
Immature Granulocytes: 0 %
Immature Granulocytes: 0 %
Lymphocytes Relative: 11 %
Lymphocytes Relative: 4 %
Lymphs Abs: 1 10*3/uL (ref 0.7–4.0)
Lymphs Abs: 0.4 10*3/uL — ABNORMAL LOW (ref 0.7–4.0)
MCH: 30.1 pg (ref 26.0–34.0)
MCH: 29.9 pg (ref 26.0–34.0)
MCHC: 31.2 g/dL (ref 30.0–36.0)
MCHC: 30.8 g/dL (ref 30.0–36.0)
MCV: 96.6 fL (ref 80.0–100.0)
MCV: 97 fL (ref 80.0–100.0)
Monocytes Absolute: 0.6 10*3/uL (ref 0.1–1.0)
Monocytes Absolute: 0.2 10*3/uL (ref 0.1–1.0)
Monocytes Relative: 7 %
Monocytes Relative: 2 %
Neutro Abs: 6.9 10*3/uL (ref 1.7–7.7)
Neutro Abs: 9.5 10*3/uL — ABNORMAL HIGH (ref 1.7–7.7)
Neutrophils Relative %: 79 %
Neutrophils Relative %: 93 %
Platelets: 194 10*3/uL (ref 150–400)
Platelets: 199 10*3/uL (ref 150–400)
RBC: 4.71 MIL/uL (ref 4.22–5.81)
RBC: 4.65 MIL/uL (ref 4.22–5.81)
RDW: 14.6 % (ref 11.5–15.5)
RDW: 14.8 % (ref 11.5–15.5)
WBC: 8.8 10*3/uL (ref 4.0–10.5)
WBC: 10.1 10*3/uL (ref 4.0–10.5)
nRBC: 0 % (ref 0.0–0.2)
nRBC: 0 % (ref 0.0–0.2)

## 2019-11-27 LAB — TROPONIN I (HIGH SENSITIVITY)
Troponin I (High Sensitivity): 63 ng/L — ABNORMAL HIGH (ref <18)
Troponin I (High Sensitivity): 63 ng/L — ABNORMAL HIGH (ref <18)
Troponin I (High Sensitivity): 45 ng/L — ABNORMAL HIGH (ref <18)

## 2019-11-27 LAB — BRAIN NATRIURETIC PEPTIDE
B Natriuretic Peptide: 296 pg/mL — ABNORMAL HIGH (ref 0.0–100.0)
B Natriuretic Peptide: 323 pg/mL — ABNORMAL HIGH (ref 0.0–100.0)

## 2019-11-27 LAB — HIV ANTIBODY (ROUTINE TESTING W REFLEX): HIV Screen 4th Generation wRfx: NONREACTIVE

## 2019-11-27 LAB — MAGNESIUM: Magnesium: 2.2 mg/dL (ref 1.7–2.4)

## 2019-11-27 LAB — PROTIME-INR
INR: 3 — ABNORMAL HIGH (ref 0.8–1.2)
Prothrombin Time: 29.9 seconds — ABNORMAL HIGH (ref 11.4–15.2)

## 2019-11-27 LAB — SARS CORONAVIRUS 2 BY RT PCR (HOSPITAL ORDER, PERFORMED IN ~~LOC~~ HOSPITAL LAB): SARS Coronavirus 2: NEGATIVE

## 2019-11-27 MED ORDER — POTASSIUM CHLORIDE 10 MEQ/100ML IV SOLN: 10.0000 meq | INTRAVENOUS | Status: DC

## 2019-11-27 MED ORDER — IPRATROPIUM-ALBUTEROL 0.5-2.5 (3) MG/3ML IN SOLN
3.0000 mL | Freq: Four times a day (QID) | RESPIRATORY_TRACT | Status: DC
  Administered 2019-11-27 – 2019-11-28 (×5): 3 mL via RESPIRATORY_TRACT
  Filled 2019-11-27 (×5): qty 3

## 2019-11-27 MED ORDER — FUROSEMIDE 40 MG PO TABS
40.0000 mg | ORAL_TABLET | Freq: Once | ORAL | Status: AC
  Administered 2019-11-27: 40 mg via ORAL
  Filled 2019-11-27: qty 1

## 2019-11-27 MED ORDER — METHYLPREDNISOLONE SODIUM SUCC 125 MG IJ SOLR
60.0000 mg | Freq: Four times a day (QID) | INTRAMUSCULAR | Status: DC
  Administered 2019-11-27 – 2019-11-30 (×12): 60 mg via INTRAVENOUS
  Filled 2019-11-27 (×12): qty 2

## 2019-11-27 MED ORDER — DOFETILIDE 500 MCG PO CAPS: 500.0000 ug | ORAL_CAPSULE | Freq: Two times a day (BID) | ORAL | Status: DC

## 2019-11-27 MED ORDER — ENOXAPARIN SODIUM 80 MG/0.8ML ~~LOC~~ SOLN
80.0000 mg | SUBCUTANEOUS | Status: DC
  Administered 2019-11-27: 80 mg via SUBCUTANEOUS
  Filled 2019-11-27: qty 0.8

## 2019-11-27 MED ORDER — POTASSIUM CHLORIDE CRYS ER 20 MEQ PO TBCR
40.0000 meq | EXTENDED_RELEASE_TABLET | Freq: Once | ORAL | Status: AC
  Administered 2019-11-27: 40 meq via ORAL
  Filled 2019-11-27: qty 2

## 2019-11-27 MED ORDER — SORBITOL 70 % SOLN
30.0000 mL | Freq: Every day | Status: DC | PRN
  Filled 2019-11-27: qty 30

## 2019-11-27 MED ORDER — SODIUM CHLORIDE 0.9% FLUSH: 3.0000 mL | INTRAVENOUS | Status: DC | PRN

## 2019-11-27 MED ORDER — ACETAMINOPHEN 325 MG PO TABS
650.0000 mg | ORAL_TABLET | Freq: Four times a day (QID) | ORAL | Status: DC | PRN
  Administered 2019-11-29: 650 mg via ORAL
  Filled 2019-11-27: qty 2

## 2019-11-27 MED ORDER — AMOXICILLIN-POT CLAVULANATE 875-125 MG PO TABS
1.0000 | ORAL_TABLET | Freq: Two times a day (BID) | ORAL | Status: DC
  Administered 2019-11-27 – 2019-11-30 (×6): 1 via ORAL
  Filled 2019-11-27 (×6): qty 1

## 2019-11-27 MED ORDER — METOPROLOL TARTRATE 25 MG PO TABS
25.0000 mg | ORAL_TABLET | Freq: Two times a day (BID) | ORAL | Status: DC
  Administered 2019-11-27 – 2019-11-28 (×2): 25 mg via ORAL
  Filled 2019-11-27 (×2): qty 1

## 2019-11-27 MED ORDER — CHLORHEXIDINE GLUCONATE CLOTH 2 % EX PADS
6.0000 | MEDICATED_PAD | Freq: Every day | CUTANEOUS | Status: DC
  Administered 2019-11-28 – 2019-11-29 (×2): 6 via TOPICAL

## 2019-11-27 MED ORDER — TAMSULOSIN HCL 0.4 MG PO CAPS
0.4000 mg | ORAL_CAPSULE | Freq: Every day | ORAL | Status: DC
  Administered 2019-11-27 – 2019-11-30 (×4): 0.4 mg via ORAL
  Filled 2019-11-27 (×4): qty 1

## 2019-11-27 MED ORDER — WARFARIN - PHARMACIST DOSING INPATIENT
Freq: Every day | Status: DC
  Administered 2019-11-28: 1

## 2019-11-27 MED ORDER — POTASSIUM CHLORIDE 10 MEQ/100ML IV SOLN
10.0000 meq | INTRAVENOUS | Status: AC
  Administered 2019-11-27 (×4): 10 meq via INTRAVENOUS
  Filled 2019-11-27 (×4): qty 100

## 2019-11-27 MED ORDER — IOHEXOL 350 MG/ML SOLN
100.0000 mL | Freq: Once | INTRAVENOUS | Status: AC | PRN
  Administered 2019-11-27: 100 mL via INTRAVENOUS

## 2019-11-27 MED ORDER — POLYETHYLENE GLYCOL 3350 17 G PO PACK: 17.0000 g | PACK | Freq: Every day | ORAL | Status: DC | PRN

## 2019-11-27 MED ORDER — BENAZEPRIL HCL 10 MG PO TABS
20.0000 mg | ORAL_TABLET | Freq: Every day | ORAL | Status: DC
  Administered 2019-11-27 – 2019-11-30 (×4): 20 mg via ORAL
  Filled 2019-11-27 (×4): qty 2

## 2019-11-27 MED ORDER — ACETAMINOPHEN 650 MG RE SUPP: 650.0000 mg | Freq: Four times a day (QID) | RECTAL | Status: DC | PRN

## 2019-11-27 MED ORDER — SODIUM CHLORIDE 0.9% FLUSH
3.0000 mL | Freq: Two times a day (BID) | INTRAVENOUS | Status: DC
  Administered 2019-11-27 – 2019-11-30 (×4): 3 mL via INTRAVENOUS

## 2019-11-27 MED ORDER — SODIUM CHLORIDE 0.9 % IV SOLN: 250.0000 mL | INTRAVENOUS | Status: DC | PRN

## 2019-11-27 NOTE — ED Notes (Signed)
CRITICAL VALUE ALERT  Critical Value:  pCO2 81.6  Date & Time Notied:  11/27/19 1855  Provider Notified: Dr. Jamse Arn  Orders Received/Actions taken: MD notified

## 2019-11-27 NOTE — H&P (Signed)
History and Physical:    Jesus Rodriguez   TIW:[id number] DOB: [birth year] DOA: 11/27/2019  Referring MD/provider: PA Sophie PCP: Patient, [name]   Patient coming from: Home  Chief Complaint: Weakness  History of Present Illness:   Jesus Rodriguez is an 69 y.o. male with PMH sig for atrial fibrillation, COPD, mild diastolic heard dysfunction, OSA, morbid obesity presents with weakness. History is per patient who is somewhat somnolent during history, but he does wake up to provide a coherent history at times.   Patient states he has had increasing weakness and difficulty getting about secondary to SOB/DOE. Notes he used to be able to go out to eat but over past several weeks, he has had difficulty even walking around his house. Notes his last full meal was last week when his neighbor brought him food. He states he has had difficulty breathing for 2 years but has gotten progressively worse over several months and much worse over 2-4 weeks. Admits he has had a cough productive of cream phlegm. Not sure if he has had fevers or wheezes. States he has been takng his medications regularly. Admits he has been sleeping a lot. Patient notes he stopped taking his Lasix last week "because Dr Harl Bowie told me to." But notes his SOB predates him stopping his Lasix.   ED Course:  The patient was noted to be hypoxic with o2 sats 88% on RA which corrected to 92% on 3-4 liters. He was thought to be in CHF and was given IV Lasix and called in for admission. Of note, BNP is minimally elevated and CXR shows minimal pulmonary vascular congestion.   ROS:   ROS   Review of Systems: General: Denies chills, malaise,  Respiratory: Denies hemoptysis Cardiovascular: Denies chest pain or palpitations GI: Denies nausea, vomiting, diarrhea or constipation GU: Denies dysuria, frequency or hematuria   Past Medical History:   Past Medical History:  Diagnosis Date  . Anxiety 02/13/2016  . Atrial flutter ([place])  02/13/2016   s/p CTI ablation at [facility]  . DDD (degenerative disc disease), lumbar 02/13/2016  . Elevated cholesterol 02/13/2016  . Hypertension 02/13/2016  . Insomnia 02/13/2016  . Osteoarthritis of both hands 02/13/2016  . Osteoarthritis of both knees 02/13/2016  . Persistent atrial fibrillation ([place])   . RA (rheumatoid arthritis) ([place]) 02/13/2016   Sero Negative  . 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 Sleep apnea 02/13/2016    Past Surgical History:   Past Surgical History:  Procedure Laterality Date  . A-FLUTTER ABLATION     at [facility] 2018  . CARDIOVERSION  02/10/2018  . CARDIOVERSION N/A 05/13/2018   Procedure: CARDIOVERSION;  Surgeon: Sanda Klein, MD;  Location: [facility];  Service: Cardiovascular;  Laterality: N/A;  . CATARACT EXTRACTION      Social History:   Social History   Socioeconomic History  . Marital status: Divorced    Spouse name: Not on file  . Number of children: Not on file  . Years of education: Not on file  . Highest education level: Not on file  Occupational History  . Not on file  Tobacco Use  . Smoking status: Current Every Day Smoker    Packs/day: 2.00    Years: 35.00    Pack years: 70.00    Types: Cigarettes  . Smokeless tobacco: Never Used  Vaping Use  . Vaping Use: Never used  Substance and Sexual Activity  . Alcohol use: No  . Drug use: No  . Sexual activity:  Not on file  Other Topics Concern  . Not on file  Social History Narrative  . Not on file   Social Determinants of Health   Financial Resource Strain:   . Difficulty of Paying Living Expenses:   Food Insecurity:   . Worried About Charity fundraiser in the Last Year:   . Arboriculturist in the Last Year:   Transportation Needs:   . Film/video editor (Medical):   Marland Kitchen Lack of Transportation (Non-Medical):   Physical Activity:   . Days of Exercise per Week:   . Minutes of Exercise per Session:   Stress:   . Feeling of Stress :   Social Connections:   . Frequency of  Communication with Friends and Family:   . Frequency of Social Gatherings with Friends and Family:   . Attends Religious Services:   . Active Member of Clubs or Organizations:   . Attends Archivist Meetings:   Marland Kitchen Marital Status:   Intimate Partner Violence:   . Fear of Current or Ex-Partner:   . Emotionally Abused:   Marland Kitchen Physically Abused:   . Sexually Abused:     Allergies   Celebrex [celecoxib], Methotrexate derivatives, and Sulfa antibiotics  Family history:   Family History  Problem Relation Age of Onset  . Hypertension Father   . Heart attack Father   . Cancer Father        prostate  . Hypertension Sister   . Hypertension Sister   . Hypertension Sister   . Epilepsy Son   . Diabetes Son     Current Medications:   Prior to Admission medications   Medication Sig Start Date End Date Taking? Authorizing Provider  acetaminophen (TYLENOL) 500 MG tablet Take by mouth as needed.     [provider]  benazepril (LOTENSIN) 20 MG tablet Take 1 tablet (20 mg total) by mouth daily. 12/29/18   Arnoldo Lenis, MD  dofetilide (TIKOSYN) 500 MCG capsule TAKE 1 CAPSULE BY MOUTH TWICE A DAY 11/14/19   Arnoldo Lenis, MD  HYDROcodone-acetaminophen (NORCO/VICODIN) 5-325 MG tablet Take 1 tablet by mouth 2 (two) times daily as needed for moderate pain. 11/24/19   Deveshwar, Abel Presto, MD  INCRUSE ELLIPTA 62.5 MCG/INH AEPB Inhale 1 puff into the lungs daily. 08/31/19   [provider]  metoprolol tartrate (LOPRESSOR) 25 MG tablet TAKE 1 TABLET BY MOUTH TWICE A DAY 10/18/19   Arnoldo Lenis, MD  sildenafil (VIAGRA) 100 MG tablet TAKE 1/2 TABLET 30 MINS BEFORE 01/04/19   Arnoldo Lenis, MD  simvastatin (ZOCOR) 20 MG tablet TAKE 1 TABLET (20 MG TOTAL) BY MOUTH DAILY AT 6 PM 09/26/19   Arnoldo Lenis, MD  tamsulosin (FLOMAX) 0.4 MG CAPS capsule Take by mouth daily. 07/23/17   [provider]  torsemide (DEMADEX) 20 MG tablet TAKE 2 TABLETS (40 MG TOTAL)  BY MOUTH 2 (TWO) TIMES DAILY. Patient taking differently: 60 mg 2 (two) times daily.  11/07/19   Arnoldo Lenis, MD  warfarin (COUMADIN) 10 MG tablet TAKE 1/2 TO 1 TABLET DAILY AS DIRECTED BY COUMADIN CLINIC. 10/10/19   Arnoldo Lenis, MD    Physical Exam:   Vitals:   11/27/19 1800 11/27/19 1836 11/27/19 1845 11/27/19 2000  BP: (!) 108/54  133/73 94/77  Pulse: 96  (!) 106 96  Resp: (!) 26  (!) 26 (!) 32  Temp:  99.1 F (37.3 C)    SpO2: 94%  92% Marland Kitchen)  89%  Weight:      Height:         Physical Exam: Blood pressure 94/77, pulse 96, temperature 99.1 F (37.3 C), resp. rate (!) 32, height 6\' 1"  (1.854 m), weight (!) 176.9 kg, SpO2 (!) 89 %. Gen: Morbidly obese man sitting up in chair, dozing, arousable by voice alone.  Eyes: sclera anicteric, conjuctiva clear CVS: very distant heart sounds, irregular Respiratory:  decreased air entry likely secondary to body habitus and decreased effort.  GI: obese NABS, soft, NT  LE: brawny 4+ edema bilaterallly with changes of chronic venous stasis Neuro:  grossly nonfocal.  Psych: patient is sleepy but logical and coherent when awake.   Data Review:    Labs: Basic Metabolic Panel: Recent Labs  Lab 11/27/19 1006  NA 138  K 2.8*  CL 85*  CO2 41*  GLUCOSE 116*  BUN 16  CREATININE 0.77  CALCIUM 8.5*   Liver Function Tests: Recent Labs  Lab 11/27/19 1006  AST 14*  ALT 10  ALKPHOS 65  BILITOT 1.0  PROT 7.0  ALBUMIN 3.2*   No results for input(s): LIPASE, AMYLASE in the last 168 hours. No results for input(s): AMMONIA in the last 168 hours. CBC: Recent Labs  Lab 11/27/19 1006  WBC 8.8  NEUTROABS 6.9  HGB 14.2  HCT 45.5  MCV 96.6  PLT 194   Cardiac Enzymes: No results for input(s): CKTOTAL, CKMB, CKMBINDEX, TROPONINI in the last 168 hours.  BNP (last 3 results) No results for input(s): PROBNP in the last 8760 hours. CBG: No results for input(s): GLUCAP in the last 168 hours.  Urinalysis No results found  for: COLORURINE, APPEARANCEUR, LABSPEC, PHURINE, GLUCOSEU, HGBUR, BILIRUBINUR, KETONESUR, PROTEINUR, UROBILINOGEN, NITRITE, LEUKOCYTESUR    Radiographic Studies: CT Angio Chest PE W and/or Wo Contrast  Result Date: 11/27/2019 CLINICAL DATA:  Chest pain or SOB, pleurisy or effusion suspected EXAM: CT ANGIOGRAPHY CHEST WITH CONTRAST TECHNIQUE: Multidetector CT imaging of the chest was performed using the standard protocol during bolus administration of intravenous contrast. Multiplanar CT image reconstructions and MIPs were obtained to evaluate the vascular anatomy. CONTRAST:  125mL OMNIPAQUE IOHEXOL 350 MG/ML SOLN COMPARISON:  Radiograph earlier this day. FINDINGS: Cardiovascular: Contrast bolus timing is suboptimal for assessment of pulmonary embolus. Evaluation is diagnostic to the lobar level. There are no central pulmonary arterial filling defects. The segmental and subsegmental branches cannot be assessed due to contrast bolus timing and soft tissue attenuation from habitus. Dilatation of the main pulmonary artery at 4.2 cm. Aortic atherosclerosis. Aortic tortuosity. No aortic aneurysm. Mild multi chamber cardiomegaly. There are coronary artery calcifications. No pericardial effusion. Mediastinum/Nodes: Generalized mediastinal lipomatosis. Scattered small mediastinal lymph nodes not enlarged by size criteria. No hilar adenopathy. No esophageal thickening. No visualized thyroid nodule. Lungs/Pleura: Elevation of left hemidiaphragm with adjacent compressive atelectasis at the left lower lobe and lingula. Dependent opacity in the right lower lobe may be atelectasis or pneumonia. No significant pleural fluid. Vascular congestion with minor septal thickening/pulmonary edema. No evidence of pulmonary mass. Upper Abdomen: Enlarged liver partially included. 2.5 cm cyst in the left lobe. Questionable gallbladder wall thickening versus motion artifact. Musculoskeletal: Diffuse multilevel degenerative change in the  spine. There are no acute or suspicious osseous abnormalities. Review of the MIP images confirms the above findings. IMPRESSION: 1. Suboptimal contrast bolus timing for assessment of pulmonary embolus. No central pulmonary arterial filling defects to the lobar level. The segmental and subsegmental branches cannot be assessed due to contrast bolus timing and  soft tissue attenuation from habitus. 2. Dilatation of the main pulmonary artery suggesting pulmonary arterial hypertension. 3. Elevation of left hemidiaphragm with adjacent compressive atelectasis at the left lower lobe and lingula. Dependent opacity in the right lower lobe may be atelectasis or pneumonia. 4. Cardiomegaly with minor pulmonary edema. 5. Questionable gallbladder wall thickening versus motion artifact. 6. Coronary artery calcifications. Aortic Atherosclerosis (ICD10-I70.0). Electronically Signed   By: Keith Rake M.D.   On: 11/27/2019 19:08   DG Chest Port 1 View  Result Date: 11/27/2019 CLINICAL DATA:  Short of breath on exertion. History of atrial fibrillation. EXAM: PORTABLE CHEST 1 VIEW COMPARISON:  None. FINDINGS: Mildly degraded exam due to AP portable technique and patient body habitus. Midline trachea. Marked cardiomegaly. Small left and probable small right pleural effusions. No pneumothorax. Pulmonary interstitial thickening, accentuated by low lung volumes. Bibasilar airspace disease. IMPRESSION: Cardiomegaly and mild pulmonary venous congestion. Low lung volumes with small bilateral pleural effusions and bibasilar airspace disease, most likely atelectasis. Limitations secondary to patient's size as detailed above. Electronically Signed   By: Abigail Miyamoto M.D.   On: 11/27/2019 10:47    EKG: Independently reviewed. Wandering Atrial Pacemaker at 79, QTC is elevated at 631    Assessment/Plan:   Principal Problem:   Acute respiratory failure with hypoxia [facility]) Active Problems:   Hypertension   Sleep apnea   Atrial  fibrillation ([place])  69 yo with multifactorial causes of lung failure causing hypoxia and hypercarbia.    Acute on Chronic Respiratory failure causing weakness Multifactorial secondary to obstructive lung disease, restrictive lung disease/obesity, likely some level of obesity hypoventilation and  pulmonary hypertension per CT. He may have some infiltration of pulmonary edema as well. ABD shows CO2 81 consistent with chronic retention, explains his somnolence Will place patient on bipap. Will also place patient on Duonebs and solumedrol with Augmentin as he does meet Gold criteria.  I am not sure if patient is actually in pulmonary edema, will hold Lasix for now as we replete his low K  Hypokalemia Potassium 2.8, has been getting aggressive K repletion Will recheck K tonight to continue repletion as warranted.  Prolonged QTC Likely secondary to very low K, also patient on dofetilide Will ask pharmacy to dose dofetilide given prolonged QTC  Decubitus Ulcer  Patient states he has wounds on his sacrum, was unable to get up to examine Will need to be examined after he has been placed in bed Wound consult placed No fevers or leukocytosis at present worrisome for acute infection  Atrial Fibrillation Per Dr Nelly Laurence note, will continue dofetilide (patient confirmed he has missed any doses) Continue warfarin--pharmacy consult placed  Per Dr Nelly Laurence notes, he is s/p ablation 12/17, failed sotalol  Chronic diastolic HF 09/5679 echo LVEF 55-60% with mild LV thickening Continue metoprolol, benazepril Holding lasix as noted above till K is adequately repleted  HTN Continue metoprolol and benazepril  OSA On Bipap at present    Other information:   DVT prophylaxis: Warfarin ordered. Code Status: Full Family Communication: Patient lives alone, states his kids know he is in hospital  Disposition Plan: TBD Consults called: None Admission status: Inpatient    Jesus Rodriguez  Layani Foronda [facility]  If 7PM-7AM, please contact night-coverage [email or web address] Password [facility] 11/27/2019, 9:16 PM

## 2019-11-27 NOTE — Progress Notes (Signed)
Pharmacy Review for Dofetilide (Tikosyn)    Admit Complaint: 69 y.o. male admitted 11/27/2019 with atrial fibrillation to be continued on dofetilide.    Assessment: QTc: 613 msec Patient   already took his dose of dofetilide 579mcg earlier this morning K is 2.8 and is being replaced Mg level  has been ordered      Goal of Therapy: Follow renal function, electrolytes, potential drug interactions, and dose adjustment. Provide education and 1 week supply at discharge.  Plan:  1. Hold Tikosyn tonight for prolonged QTc 2. Follow up QTc, renal function, electrolytes (K & Mg) daily x 3 days and make dose adjustments as necessary.     Despina Pole 9:42 PM 11/27/2019

## 2019-11-27 NOTE — ED Notes (Signed)
Pt transferred with 1 person assist to recliner chair for comfort.

## 2019-11-27 NOTE — Progress Notes (Signed)
ANTICOAGULATION CONSULT NOTE -   Pharmacy Consult for warfarin dosing Indication: atrial fibrillation  Allergies  Allergen Reactions  . Celebrex [Celecoxib]   . Methotrexate Derivatives Other (See Comments)    Arrhythmia   . Sulfa Antibiotics     Patient Measurements: Height: 6\' 1"  (185.4 cm) Weight: (!) 176.9 kg (390 lb) IBW/kg (Calculated) : 79.9 Heparin Dosing Weight: HEPARIN DW (KG): 123  Vital Signs: Temp: 99.1 F (37.3 C) (08/15 1836) BP: 94/77 (08/15 2000) Pulse Rate: 96 (08/15 2000)  Labs: Recent Labs    11/27/19 1006 11/27/19 1208  HGB 14.2  --   HCT 45.5  --   PLT 194  --   CREATININE 0.77  --   TROPONINIHS 63* 63*    Estimated Creatinine Clearance: 146.3 mL/min (by C-G formula based on SCr of 0.77 mg/dL).   Medical History: Past Medical History:  Diagnosis Date  . Anxiety 02/13/2016  . Atrial flutter ([place]) 02/13/2016   s/p CTI ablation at [facility]  . DDD (degenerative disc disease), lumbar 02/13/2016  . Elevated cholesterol 02/13/2016  . Hypertension 02/13/2016  . Insomnia 02/13/2016  . Osteoarthritis of both hands 02/13/2016  . Osteoarthritis of both knees 02/13/2016  . Persistent atrial fibrillation ([place])   . RA (rheumatoid arthritis) ([place]) 02/13/2016   Sero Negative  . 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 Sleep apnea 02/13/2016     Assessment: Pharmacy consulted to dose warfarin  for this 69 yo male on chronic anti-coagulation with warfarin for atrial fibrillation.  Patient already took his warfarin dose at 0800 this morning and also received   Lovenox 80mg  today at 1733.   Baseline CBC is WNL.  Home dose: warfarin 5mg  on Sun/Tues/Thurs                       warfarin 10mg  on Mon/Wed/Fri/Sat  Goal of Therapy:  INR 2-3 Monitor platelets by anticoagulation protocol: Yes   Plan:  Hold warfarin dose tonight-->pt has had both Lovenox and warfarin today Stat INR Daily PT/INR and every other day CBC Monitor patient for signs and symptoms of  bleeding.  Despina Pole 11/27/2019,9:21 PM

## 2019-11-27 NOTE — ED Triage Notes (Signed)
Patient arrives via EMS from home with c/o SOB on exertion x4days. Patient has not been taking diuretic x 4 days due to initial significant weight loss, told to hold per PMD. Home saturations 70s on RA, 98% on 4 Liters O2. H/o afib. Patient also c/o possible abscesses on buttocks that ruptured last night.

## 2019-11-27 NOTE — ED Provider Notes (Signed)
[facility] EMERGENCY DEPARTMENT Provider Note   CSN: [id number] Arrival date & time: 11/27/19  0913     History Chief Complaint  Patient presents with  . Shortness of Breath    Jesus Rodriguez is a 69 y.o. male.  The history is provided by the patient. No language interpreter was used.  Shortness of Breath Severity:  Moderate Onset quality:  Gradual Timing:  Constant Progression:  Worsening Chronicity:  New Relieved by:  Nothing Worsened by:  Nothing Ineffective treatments:  None tried Risk factors: no recent alcohol use   Pt complains of shortness of breath. Pt reports increasing shortness of breath.  Pt stopped taking fluid medication.  Pt smokes 2 packs a day.  Pt denies feeling sick     Past Medical History:  Diagnosis Date  . Anxiety 02/13/2016  . Atrial flutter ([place]) 02/13/2016   s/p CTI ablation at [facility]  . DDD (degenerative disc disease), lumbar 02/13/2016  . Elevated cholesterol 02/13/2016  . Hypertension 02/13/2016  . Insomnia 02/13/2016  . Osteoarthritis of both hands 02/13/2016  . Osteoarthritis of both knees 02/13/2016  . Persistent atrial fibrillation ([place])   . RA (rheumatoid arthritis) ([place]) 02/13/2016   Sero Negative  . 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 Sleep apnea 02/13/2016    Patient Active Problem List   Diagnosis Date Noted  . Atrial fibrillation ([place]) 05/11/2018  . Encounter for therapeutic drug monitoring 04/22/2018  . Persistent atrial fibrillation ([place]) 04/08/2018  . History of rheumatoid arthritis seronegative 09/10/2017  . High risk medications (not anticoagulants) long-term use 02/14/2016  . Class 3 obesity in adult 02/14/2016  . Depression 02/14/2016  . Osteoarthritis of both hands 02/13/2016  . Osteoarthritis of both knees 02/13/2016  . DDD (degenerative disc disease), lumbar 02/13/2016  . 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 Hypertension 02/13/2016  . Elevated cholesterol 02/13/2016  . Insomnia 02/13/2016  . Anxiety 02/13/2016  .  Sleep apnea 02/13/2016  . Arrhythmia 02/13/2016    Past Surgical History:  Procedure Laterality Date  . A-FLUTTER ABLATION     at [facility] 2018  . CARDIOVERSION  02/10/2018  . CARDIOVERSION N/A 05/13/2018   Procedure: CARDIOVERSION;  Surgeon: Sanda Klein, MD;  Location: [facility] ENDOSCOPY;  Service: Cardiovascular;  Laterality: N/A;  . CATARACT EXTRACTION         Family History  Problem Relation Age of Onset  . Hypertension Father   . Heart attack Father   . Cancer Father        prostate  . Hypertension Sister   . Hypertension Sister   . Hypertension Sister   . Epilepsy Son   . Diabetes Son     Social History   Tobacco Use  . Smoking status: Current Every Day Smoker    Packs/day: 2.00    Years: 35.00    Pack years: 70.00    Types: Cigarettes  . Smokeless tobacco: Never Used  Vaping Use  . Vaping Use: Never used  Substance Use Topics  . Alcohol use: No  . Drug use: No    Home Medications Prior to Admission medications   Medication Sig Start Date End Date Taking? Authorizing Provider  acetaminophen (TYLENOL) 500 MG tablet Take by mouth as needed.     [provider]  benazepril (LOTENSIN) 20 MG tablet Take 1 tablet (20 mg total) by mouth daily. 12/29/18   Arnoldo Lenis, MD  dofetilide (TIKOSYN) 500 MCG capsule TAKE 1 CAPSULE BY MOUTH TWICE A DAY 11/14/19   Carlyle Dolly  F, MD  HYDROcodone-acetaminophen (NORCO/VICODIN) 5-325 MG tablet Take 1 tablet by mouth 2 (two) times daily as needed for moderate pain. 11/24/19   Deveshwar, Abel Presto, MD  INCRUSE ELLIPTA 62.5 MCG/INH AEPB Inhale 1 puff into the lungs daily. 08/31/19   [provider]  metoprolol tartrate (LOPRESSOR) 25 MG tablet TAKE 1 TABLET BY MOUTH TWICE A DAY 10/18/19   Arnoldo Lenis, MD  sildenafil (VIAGRA) 100 MG tablet TAKE 1/2 TABLET 30 MINS BEFORE 01/04/19   Arnoldo Lenis, MD  simvastatin (ZOCOR) 20 MG tablet TAKE 1 TABLET (20 MG TOTAL) BY MOUTH DAILY AT 6 PM 09/26/19   Arnoldo Lenis, MD  tamsulosin (FLOMAX) 0.4 MG CAPS capsule Take by mouth daily. 07/23/17   [provider]  torsemide (DEMADEX) 20 MG tablet TAKE 2 TABLETS (40 MG TOTAL) BY MOUTH 2 (TWO) TIMES DAILY. Patient taking differently: 60 mg 2 (two) times daily.  11/07/19   Arnoldo Lenis, MD  warfarin (COUMADIN) 10 MG tablet TAKE 1/2 TO 1 TABLET DAILY AS DIRECTED BY COUMADIN CLINIC. 10/10/19   Arnoldo Lenis, MD    Allergies    Celebrex [celecoxib], Methotrexate derivatives, and Sulfa antibiotics  Review of Systems   Review of Systems  Respiratory: Positive for shortness of breath.   All other systems reviewed and are negative.   Physical Exam Updated Vital Signs BP 127/68   Pulse 78   Resp 18   Ht 6\' 1"  (1.854 m)   Wt (!) 176.9 kg   SpO2 (!) 89%   BMI 51.45 kg/m   Physical Exam Vitals and nursing note reviewed.  Constitutional:      Appearance: He is well-developed.  HENT:     Head: Normocephalic and atraumatic.  Eyes:     Conjunctiva/sclera: Conjunctivae normal.     Pupils: Pupils are equal, round, and reactive to light.  Cardiovascular:     Rate and Rhythm: Normal rate and regular rhythm.     Heart sounds: No murmur heard.   Pulmonary:     Effort: Pulmonary effort is normal. No respiratory distress.     Breath sounds: Normal breath sounds. No decreased breath sounds.  Abdominal:     Palpations: Abdomen is soft.     Tenderness: There is no abdominal tenderness.  Genitourinary:    Comments: Red area mid back and sacral area  Musculoskeletal:        General: Normal range of motion.     Cervical back: Neck supple.  Skin:    General: Skin is warm and dry.  Neurological:     General: No focal deficit present.     Mental Status: He is alert.  Psychiatric:        Mood and Affect: Mood normal.     ED Results / Procedures / Treatments   Labs (all labs ordered are listed, but only abnormal results are displayed) Labs Reviewed  COMPREHENSIVE METABOLIC PANEL -  Abnormal; Notable for the following components:      Result Value   Potassium 2.8 (*)    Chloride 85 (*)    CO2 41 (*)    Glucose, Bld 116 (*)    Calcium 8.5 (*)    Albumin 3.2 (*)    AST 14 (*)    All other components within normal limits  BRAIN NATRIURETIC PEPTIDE - Abnormal; Notable for the following components:   B Natriuretic Peptide 296.0 (*)    All other components within normal limits  TROPONIN I (HIGH SENSITIVITY) -  Abnormal; Notable for the following components:   Troponin I (High Sensitivity) 63 (*)    All other components within normal limits  SARS CORONAVIRUS 2 BY RT PCR [facility] ORDER, [place] [facility])  CBC WITH DIFFERENTIAL/PLATELET  TROPONIN I (HIGH SENSITIVITY)    EKG EKG Interpretation  Date/Time:  Sunday November 27 2019 10:06:14 EDT Ventricular Rate:  82 PR Interval:    QRS Duration: 108 QT Interval:  524 QTC Calculation: 613 R Axis:   38 Text Interpretation: Sinus rhythm Atrial premature complex Nonspecific T abnormalities, anterior leads Prolonged QT interval No significant change since 05/24/2018 Confirmed by Veryl Speak [phone]) on 11/27/2019 10:36:00 AM   Radiology DG Chest Port 1 View  Result Date: 11/27/2019 CLINICAL DATA:  Short of breath on exertion. History of atrial fibrillation. EXAM: PORTABLE CHEST 1 VIEW COMPARISON:  None. FINDINGS: Mildly degraded exam due to AP portable technique and patient body habitus. Midline trachea. Marked cardiomegaly. Small left and probable small right pleural effusions. No pneumothorax. Pulmonary interstitial thickening, accentuated by low lung volumes. Bibasilar airspace disease. IMPRESSION: Cardiomegaly and mild pulmonary venous congestion. Low lung volumes with small bilateral pleural effusions and bibasilar airspace disease, most likely atelectasis. Limitations secondary to patient's size as detailed above. Electronically Signed   By: Abigail Miyamoto M.D.   On: 11/27/2019 10:47     Procedures Procedures (including critical care time)  Medications Ordered in ED Medications - No data to display  ED Course  I have reviewed the triage vital signs and the nursing notes.  Pertinent labs & imaging results that were available during my care of the patient were reviewed by me and considered in my medical decision making (see chart for details).    MDM Rules/Calculators/A&P                          MDM:  Chest xray shows effusions, Potassium is 2.8.  Pt given lasix 40mg  IV.  Potassium runs started, Pt given oral potassium.  Pt's 02 86 with out oxygen.  Sacral wound dressed  Final Clinical Impression(s) / ED Diagnoses Final diagnoses:  Acute respiratory distress  Congestive heart failure, unspecified HF chronicity, unspecified heart failure type (HCC)  Hypokalemia  Pressure injury of buttock, stage 2, unspecified laterality [facility])    Rx / DC Orders ED Discharge Orders    None       Fransico Meadow, [place] 11/27/19 1447    Veryl Speak, MD 11/27/19 1515

## 2019-11-28 ENCOUNTER — Encounter (HOSPITAL_COMMUNITY): Payer: Self-pay | Admitting: Internal Medicine

## 2019-11-28 DIAGNOSIS — I4819 Other persistent atrial fibrillation: Secondary | ICD-10-CM

## 2019-11-28 DIAGNOSIS — L8990 Pressure ulcer of unspecified site, unspecified stage: Secondary | ICD-10-CM | POA: Insufficient documentation

## 2019-11-28 DIAGNOSIS — G4733 Obstructive sleep apnea (adult) (pediatric): Secondary | ICD-10-CM

## 2019-11-28 DIAGNOSIS — I10 Essential (primary) hypertension: Secondary | ICD-10-CM

## 2019-11-28 DIAGNOSIS — R9431 Abnormal electrocardiogram [ECG] [EKG]: Secondary | ICD-10-CM

## 2019-11-28 LAB — PROTIME-INR
INR: 2.7 — ABNORMAL HIGH (ref 0.8–1.2)
Prothrombin Time: 27.4 seconds — ABNORMAL HIGH (ref 11.4–15.2)

## 2019-11-28 LAB — CBC
HCT: 47.1 % (ref 39.0–52.0)
Hemoglobin: 14.4 g/dL (ref 13.0–17.0)
MCH: 29.9 pg (ref 26.0–34.0)
MCHC: 30.6 g/dL (ref 30.0–36.0)
MCV: 97.9 fL (ref 80.0–100.0)
Platelets: 200 10*3/uL (ref 150–400)
RBC: 4.81 MIL/uL (ref 4.22–5.81)
RDW: 14.3 % (ref 11.5–15.5)
WBC: 8.8 10*3/uL (ref 4.0–10.5)
nRBC: 0 % (ref 0.0–0.2)

## 2019-11-28 LAB — BASIC METABOLIC PANEL
Anion gap: 14 (ref 5–15)
BUN: 19 mg/dL (ref 8–23)
CO2: 36 mmol/L — ABNORMAL HIGH (ref 22–32)
Calcium: 8.4 mg/dL — ABNORMAL LOW (ref 8.9–10.3)
Chloride: 88 mmol/L — ABNORMAL LOW (ref 98–111)
Creatinine, Ser: 1.06 mg/dL (ref 0.61–1.24)
GFR calc Af Amer: >60 mL/min (ref >60)
GFR calc non Af Amer: >60 mL/min (ref >60)
Glucose, Bld: 141 mg/dL — ABNORMAL HIGH (ref 70–99)
Potassium: 3.2 mmol/L — ABNORMAL LOW (ref 3.5–5.1)
Sodium: 138 mmol/L (ref 135–145)

## 2019-11-28 LAB — TROPONIN I (HIGH SENSITIVITY): Troponin I (High Sensitivity): 39 ng/L — ABNORMAL HIGH (ref <18)

## 2019-11-28 LAB — MRSA PCR SCREENING: MRSA by PCR: NEGATIVE

## 2019-11-28 MED ORDER — HYDROCODONE-ACETAMINOPHEN 5-325 MG PO TABS
1.0000 | ORAL_TABLET | Freq: Once | ORAL | Status: AC
  Administered 2019-11-28: 1 via ORAL
  Filled 2019-11-28: qty 1

## 2019-11-28 MED ORDER — POTASSIUM CHLORIDE CRYS ER 20 MEQ PO TBCR: 40.0000 meq | EXTENDED_RELEASE_TABLET | ORAL | Status: DC

## 2019-11-28 MED ORDER — IPRATROPIUM-ALBUTEROL 0.5-2.5 (3) MG/3ML IN SOLN
3.0000 mL | Freq: Three times a day (TID) | RESPIRATORY_TRACT | Status: DC
  Administered 2019-11-29 – 2019-11-30 (×4): 3 mL via RESPIRATORY_TRACT
  Filled 2019-11-28 (×3): qty 3

## 2019-11-28 MED ORDER — WARFARIN SODIUM 5 MG PO TABS
10.0000 mg | ORAL_TABLET | Freq: Once | ORAL | Status: AC
  Administered 2019-11-28: 10 mg via ORAL
  Filled 2019-11-28: qty 2

## 2019-11-28 MED ORDER — POTASSIUM CHLORIDE CRYS ER 20 MEQ PO TBCR
40.0000 meq | EXTENDED_RELEASE_TABLET | Freq: Four times a day (QID) | ORAL | Status: AC
  Administered 2019-11-28 (×2): 40 meq via ORAL
  Filled 2019-11-28 (×2): qty 2

## 2019-11-28 MED ORDER — FUROSEMIDE 10 MG/ML IJ SOLN
40.0000 mg | Freq: Every day | INTRAMUSCULAR | Status: DC
  Administered 2019-11-28 – 2019-11-30 (×3): 40 mg via INTRAVENOUS
  Filled 2019-11-28 (×3): qty 4

## 2019-11-28 MED ORDER — METOPROLOL TARTRATE 50 MG PO TABS
50.0000 mg | ORAL_TABLET | Freq: Two times a day (BID) | ORAL | Status: DC
  Administered 2019-11-28 – 2019-11-30 (×4): 50 mg via ORAL
  Filled 2019-11-28 (×4): qty 1

## 2019-11-28 NOTE — Progress Notes (Signed)
Patient Demographics:    Jesus Rodriguez, is a 69 y.o. male, DOB - [birth year], WOE:[id number]  Admit date - 11/27/2019   Admitting Physician Vashti Hey, MD  Outpatient Primary MD for the patient is Patient, [name]  LOS - 1   Chief Complaint  Patient presents with   Shortness of Breath        Subjective:    Jesus Rodriguez today has no fevers, no emesis,  No chest pain,   -Dyspnea on exertion, shortness of breath and hypoxia persist - Voiding Well  Assessment  & Plan :    Principal Problem:   Acute respiratory failure with hypoxia [facility]) Active Problems:   Hypertension   Sleep apnea   Atrial fibrillation [facility])  Brief Summary:- 69 y.o. male with a history of atrial flutter status post ablation, persistent atrial fibrillation, rheumatoid arthritis, OSA, hypertension, hyperlipidemia, and obesity admitted on 11/27/2019 with acute respiratory failure with hypoxia with concern for diastolic CHF exacerbation, cannot rule out concomitant COPD exacerbation  A/p 1)HFpEF/acute diastolic dysfunction CHF exacerbation--- restart Lasix 40 mg daily, daily weights and fluid input and output monitoring -Repeat echo pending  2) persistent Afib/history of atrial flutter status post ablation in 2017 --- patient found to have prolonged QT while on Tikosyn, Tikosyn is being held,  will repeat EKG in a.m. -Patient previously failed sotalol, -Continue Coumadin -Repeat echo pending -Cardiology consult appreciated recommends increasing metoprolol to 50 twice daily  3) COPD exacerbation--- patient is a heavy smoker smoking to 1/2 packs a day -Continue IV Solu-Medrol, bronchodilators and mucolytics --Continue Augmentin -Patient is not interested in smoking cessation  4) acute on presumed chronic respiratory failure with hypoxia----secondary to #1 and #3 above--- compounded by ongoing heavy tobacco  use--- manage as above #1 #3  5)OSA----continue CPAP nightly  6)HTN--okay to continue benazepril 20 mg daily, metoprolol was increased to 50 mg twice daily more so for rate control  7)Morbid Obesity- -Low calorie diet, portion control and increase physical activity discussed with patient -Body mass index is 50.12 kg/m.   Pressure Injury --POA--- Two lesions on bilateral buttocks, each measures 9cm x 2cm. Skin is not broken, areas do not blanch--- wound care consult appreciated  Disposition/Need for in-Hospital Stay- patient unable to be discharged at this time due to --- diastolic dysfunction heart failure exacerbation with persistent hypoxia requiring further IV diuresis and prolonged QT requiring further adjustment of cardiac medications  Status is: Inpatient  Remains inpatient appropriate because:diastolic dysfunction heart failure exacerbation with persistent hypoxia requiring further IV diuresis and prolonged QT requiring further adjustment of cardiac medications   Disposition: The patient is from: Home              Anticipated d/c is to: Home              Anticipated d/c date is: 2 days              Patient currently is not medically stable to d/c. Barriers: Not Clinically Stable- -diastolic dysfunction heart failure exacerbation with persistent hypoxia requiring further IV diuresis and prolonged QT requiring further adjustment of cardiac medications  Code Status : full code  Family Communication:   (patient is alert, awake and coherent)   Consults  :  cardiology  DVT Prophylaxis  : coumadin /SCDs   Lab Results  Component Value Date   PLT 200 11/28/2019    Inpatient Medications  Scheduled Meds:  amoxicillin-clavulanate  1 tablet Oral Q12H   benazepril  20 mg Oral Daily   Chlorhexidine Gluconate Cloth  6 each Topical Daily   ipratropium-albuterol  3 mL Nebulization Q6H   methylPREDNISolone (SOLU-MEDROL) injection  60 mg Intravenous Q6H   metoprolol tartrate   50 mg Oral BID   sodium chloride flush  3 mL Intravenous Q12H   tamsulosin  0.4 mg Oral Daily   Warfarin - Pharmacist Dosing Inpatient   Does not apply q1600   Continuous Infusions:  sodium chloride     PRN Meds:.sodium chloride, acetaminophen **OR** acetaminophen, polyethylene glycol, sodium chloride flush, sorbitol    Anti-infectives (From admission, onward)   Start     Dose/Rate Route Frequency Ordered Stop   11/27/19 2200  amoxicillin-clavulanate (AUGMENTIN) 875-125 MG per tablet 1 tablet     Discontinue     1 tablet Oral Every 12 hours 11/27/19 2126 12/02/19 2159        Objective:   Vitals:   11/28/19 1458 11/28/19 1622 11/28/19 1700 11/28/19 1825  BP:  (!) 94/55 (!) 94/44 (!) 114/39  Pulse: (!) 103 84 (!) 103 (!) 102  Resp:      Temp:    97.8 F (36.6 C)  TempSrc:    Oral  SpO2: 95% 90% (!) 88% 90%  Weight:      Height:        Wt Readings from Last 3 Encounters:  11/27/19 (!) 172.3 kg  11/22/19 (!) 176 kg  10/25/19 (!) 181 kg     Intake/Output Summary (Last 24 hours) at 11/28/2019 1831 Last data filed at 11/28/2019 1826 Gross per 24 hour  Intake --  Output 800 ml  Net -800 ml   Physical Exam  Gen:- Awake Alert, morbidly obese, dyspnea with minimal activity HEENT:- [place].AT, No sclera icterus Neck-Supple Neck,No JVD,.  Lungs-diminished in bases, no wheezing  CV- S1, S2 normal, irregularly irregular Abd-  +ve B.Sounds, Abd Soft, No tenderness, increased truncal adiposity Extremity/Skin:-Chronic venous stasis, pedal pulses present  Psych-affect is appropriate, oriented x3 Neuro-no new focal deficits, no tremors   Data Review:   Micro Results Recent Results (from the past 240 hour(s))  SARS Coronavirus 2 by RT PCR (hospital order, performed in [facility] [place] hospital lab) Nasopharyngeal Nasopharyngeal Swab     Status: None   Collection Time: 11/27/19 11:21 AM   Specimen: Nasopharyngeal Swab  Result Value Ref Range Status   SARS Coronavirus 2 NEGATIVE  NEGATIVE Final    Comment: (NOTE) SARS-CoV-2 target nucleic acids are NOT DETECTED.  The SARS-CoV-2 RNA is generally detectable in upper and lower respiratory specimens during the acute phase of infection. The lowest concentration of SARS-CoV-2 viral copies this assay can detect is 250 copies / mL. A negative result does not preclude SARS-CoV-2 infection and should not be used as the sole basis for treatment or other patient management decisions.  A negative result may occur with improper specimen collection / handling, submission of specimen other than nasopharyngeal swab, presence of viral mutation(s) within the areas targeted by this assay, and inadequate number of viral copies (<250 copies / mL). A negative result must be combined with clinical observations, patient history, and epidemiological information.  Fact Sheet for Patients:   StrictlyIdeas.no  Fact Sheet for Healthcare Providers: BankingDealers.co.za  This test is not yet approved  or  cleared by the [place] and has been authorized for detection and/or diagnosis of SARS-CoV-2 by FDA under an Emergency Use Authorization (EUA).  This EUA will remain in effect (meaning this test can be used) for the duration of the COVID-19 declaration under Section 564(b)(1) of the Act, 21 U.S.C. section 360bbb-3(b)(1), unless the authorization is terminated or revoked sooner.  Performed at [facility], [address] [place], [place] [address]   MRSA PCR Screening     Status: None   Collection Time: 11/27/19  8:20 PM   Specimen: Nasal Mucosa; Nasopharyngeal  Result Value Ref Range Status   MRSA by PCR NEGATIVE NEGATIVE Final    Comment:        The GeneXpert MRSA Assay (FDA approved for NASAL specimens only), is one component of a comprehensive MRSA colonization surveillance program. It is not intended to diagnose MRSA infection nor to guide or monitor treatment for MRSA  infections. Performed at [facility], [address] [place], [place] [address]     Radiology Reports CT Angio Chest PE W and/or Wo Contrast  Result Date: 11/27/2019 CLINICAL DATA:  Chest pain or SOB, pleurisy or effusion suspected EXAM: CT ANGIOGRAPHY CHEST WITH CONTRAST TECHNIQUE: Multidetector CT imaging of the chest was performed using the standard protocol during bolus administration of intravenous contrast. Multiplanar CT image reconstructions and MIPs were obtained to evaluate the vascular anatomy. CONTRAST:  168mL OMNIPAQUE IOHEXOL 350 MG/ML SOLN COMPARISON:  Radiograph earlier this day. FINDINGS: Cardiovascular: Contrast bolus timing is suboptimal for assessment of pulmonary embolus. Evaluation is diagnostic to the lobar level. There are no central pulmonary arterial filling defects. The segmental and subsegmental branches cannot be assessed due to contrast bolus timing and soft tissue attenuation from habitus. Dilatation of the main pulmonary artery at 4.2 cm. Aortic atherosclerosis. Aortic tortuosity. No aortic aneurysm. Mild multi chamber cardiomegaly. There are coronary artery calcifications. No pericardial effusion. Mediastinum/Nodes: Generalized mediastinal lipomatosis. Scattered small mediastinal lymph nodes not enlarged by size criteria. No hilar adenopathy. No esophageal thickening. No visualized thyroid nodule. Lungs/Pleura: Elevation of left hemidiaphragm with adjacent compressive atelectasis at the left lower lobe and lingula. Dependent opacity in the right lower lobe may be atelectasis or pneumonia. No significant pleural fluid. Vascular congestion with minor septal thickening/pulmonary edema. No evidence of pulmonary mass. Upper Abdomen: Enlarged liver partially included. 2.5 cm cyst in the left lobe. Questionable gallbladder wall thickening versus motion artifact. Musculoskeletal: Diffuse multilevel degenerative change in the spine. There are no acute or suspicious osseous  abnormalities. Review of the MIP images confirms the above findings. IMPRESSION: 1. Suboptimal contrast bolus timing for assessment of pulmonary embolus. No central pulmonary arterial filling defects to the lobar level. The segmental and subsegmental branches cannot be assessed due to contrast bolus timing and soft tissue attenuation from habitus. 2. Dilatation of the main pulmonary artery suggesting pulmonary arterial hypertension. 3. Elevation of left hemidiaphragm with adjacent compressive atelectasis at the left lower lobe and lingula. Dependent opacity in the right lower lobe may be atelectasis or pneumonia. 4. Cardiomegaly with minor pulmonary edema. 5. Questionable gallbladder wall thickening versus motion artifact. 6. Coronary artery calcifications. Aortic Atherosclerosis (ICD10-I70.0). Electronically Signed   By: Keith Rake M.D.   On: 11/27/2019 19:08   DG Chest Port 1 View  Result Date: 11/27/2019 CLINICAL DATA:  Short of breath on exertion. History of atrial fibrillation. EXAM: PORTABLE CHEST 1 VIEW COMPARISON:  None. FINDINGS: Mildly degraded exam due to AP portable technique and patient  body habitus. Midline trachea. Marked cardiomegaly. Small left and probable small right pleural effusions. No pneumothorax. Pulmonary interstitial thickening, accentuated by low lung volumes. Bibasilar airspace disease. IMPRESSION: Cardiomegaly and mild pulmonary venous congestion. Low lung volumes with small bilateral pleural effusions and bibasilar airspace disease, most likely atelectasis. Limitations secondary to patient's size as detailed above. Electronically Signed   By: Abigail Miyamoto M.D.   On: 11/27/2019 10:47     CBC Recent Labs  Lab 11/27/19 1006 11/27/19 2122 11/28/19 0437  WBC 8.8 10.1 8.8  HGB 14.2 13.9 14.4  HCT 45.5 45.1 47.1  PLT 194 199 200  MCV 96.6 97.0 97.9  MCH 30.1 29.9 29.9  MCHC 31.2 30.8 30.6  RDW 14.6 14.8 14.3  LYMPHSABS 1.0 0.4*  --   MONOABS 0.6 0.2  --   EOSABS  0.2 0.0  --   BASOSABS 0.1 0.1  --     Chemistries  Recent Labs  Lab 11/27/19 1006 11/27/19 2122 11/28/19 0437  NA 138 137 138  K 2.8* 3.2* 3.2*  CL 85* 87* 88*  CO2 41* 37* 36*  GLUCOSE 116* 151* 141*  BUN 16 15 19   CREATININE 0.77 0.76 1.06  CALCIUM 8.5* 8.1* 8.4*  MG  --  2.2  --   AST 14* 15  --   ALT 10 10  --   ALKPHOS 65 65  --   BILITOT 1.0 1.1  --    ------------------------------------------------------------------------------------------------------------------ No results for input(s): CHOL, HDL, LDLCALC, TRIG, CHOLHDL, LDLDIRECT in the last 72 hours.  No results found for: HGBA1C ------------------------------------------------------------------------------------------------------------------ No results for input(s): TSH, T4TOTAL, T3FREE, THYROIDAB in the last 72 hours.  Invalid input(s): FREET3 ------------------------------------------------------------------------------------------------------------------ No results for input(s): VITAMINB12, FOLATE, FERRITIN, TIBC, IRON, RETICCTPCT in the last 72 hours.  Coagulation profile Recent Labs  Lab 11/27/19 2122 11/28/19 0437  INR 3.0* 2.7*    No results for input(s): DDIMER in the last 72 hours.  Cardiac Enzymes No results for input(s): CKMB, TROPONINI, MYOGLOBIN in the last 168 hours.  Invalid input(s): CK ------------------------------------------------------------------------------------------------------------------    Component Value Date/Time   BNP 323.0 (H) 11/27/2019 2122     Roxan Hockey M.D on 11/28/2019 at 6:31 PM  Go to [email or web address] - for contact info  [facility] - Office  [phone]

## 2019-11-28 NOTE — Progress Notes (Signed)
ANTICOAGULATION CONSULT NOTE -   Pharmacy Consult for warfarin dosing Indication: atrial fibrillation  Allergies  Allergen Reactions  . Celebrex [Celecoxib]   . Methotrexate Derivatives Other (See Comments)    Arrhythmia   . Sulfa Antibiotics     Patient Measurements: Height: 6\' 1"  (185.4 cm) Weight: (!) 172.3 kg (379 lb 13.6 oz) IBW/kg (Calculated) : 79.9 Heparin Dosing Weight: HEPARIN DW (KG): 121.6  Vital Signs: Temp: 97.6 F (36.4 C) (08/16 0900) Temp Source: Oral (08/16 0900) BP: 117/93 (08/16 0900) Pulse Rate: 104 (08/16 0900)  Labs: Recent Labs    11/27/19 1006 11/27/19 1006 11/27/19 1208 11/27/19 2122 11/27/19 2317 11/28/19 0437  HGB 14.2   < >  --  13.9  --  14.4  HCT 45.5  --   --  45.1  --  47.1  PLT 194  --   --  199  --  200  LABPROT  --   --   --  29.9*  --  27.4*  INR  --   --   --  3.0*  --  2.7*  CREATININE 0.77  --   --  0.76  --  1.06  TROPONINIHS 63*   < > 63* 45* 39*  --    < > = values in this interval not displayed.    Estimated Creatinine Clearance: 108.8 mL/min (by C-G formula based on SCr of 1.06 mg/dL).     Assessment: Pharmacy consulted to dose warfarin  for this 69 yo male on chronic anti-coagulation with warfarin for atrial fibrillation.  Patient already took his warfarin dose at 0800 this morning and also received   Lovenox 80mg  today at 1733.   Baseline CBC is WNL.  Home dose: warfarin 5mg  on Sun/Tues/Thurs                       warfarin 10mg  on Mon/Wed/Fri/Sat  Goal of Therapy:  INR 2-3 Monitor platelets by anticoagulation protocol: Yes   Plan:  Give  warfarin 10mg  x1 dose tonight- for INR 2.7  Daily PT/INR and every other day CBC Monitor patient for signs and symptoms of bleeding.  Despina Pole 11/28/2019,10:31 AM

## 2019-11-28 NOTE — Consult Note (Signed)
WOC Nurse Consult Note: Reason for Consult: Bilateral Buttock lesions Wound type: Moisture plus friction (not pressure) Pressure Injury POA:N/A Measurement: Two lesions on bilateral buttocks, each measures 9cm x 2cm. Skin is not broken, areas do not blanch Wound bed: N/A Drainage (amount, consistency, odor): N/A Periwound: Intact, dry Dressing procedure/placement/frequency: I am assisted in my assessment and in the development of the POC by a Wound Treatment Associate (WTA), Lillie Fragmin, RN and her assistance and expertise is appreciated.  The etiology of the lesions appears to be moisture plus friction and the largest risk factor has been immobility. Patient is to work with PT today and is additionally provided with a pressure redistribution chair pad for his use while OOB to the chair. He will also take this product home.  Topical care will consist of a thin application of our house moisture barrier cream with placement of a silicone foam dressing to cover.  Should the silicone fail to adhere following application of the moisture barrier cream, the areas may be left open to air.Turning and repositioning off of the supine position will augment the POC and ensure a more prompt resolution.  [facility] nursing team will not follow, but will remain available to this patient, the nursing and medical teams.  Please re-consult if needed. Thanks, Maudie Flakes, MSN, RN, [place], Arther Abbott  Pager# [phone]

## 2019-11-28 NOTE — Progress Notes (Signed)
Patient wears CPAP pressure of 15 at home. Reading taken off his machine. BiPAP is set at 18/10 f 18 40 percent. Saturation 91

## 2019-11-28 NOTE — Consult Note (Signed)
Cardiology Consultation:   Patient ID: Jesus Rodriguez; [id number]; [birth year]   Admit date: 11/27/2019 Date of Consult: 11/28/2019  Primary Care Provider: Patient, [name] Primary Cardiologist: Carlyle Dolly, MD Primary Electrophysiologist: Thompson Grayer, MD   Patient Profile:   Jesus Rodriguez is a 69 y.o. male with a history of atrial flutter status post ablation, persistent atrial fibrillation, rheumatoid arthritis, OSA, hypertension, hyperlipidemia, and obesity, who is being seen today for the evaluation of prolonged QT interval on Tikosyn at the request of Dr. Denton Brick.  History of Present Illness:   [name] is currently admitted to the hospital with progressive shortness of breath, notable in the last few weeks and associated with intermittent productive cough but no fevers or chills.  He presents with hypoxic respiratory failure requiring oxygen supplementation, also concern for acute diastolic heart failure.  Chest x-ray reports cardiomegaly with pulmonary venous congestion, also small bilateral pleural effusions and atelectasis.  Chest CTA was suboptimal for assessment of pulmonary embolus.  There was dilatation of the main pulmonary arteries suggesting pulmonary hypertension, also elevated left hemidiaphragm with atelectasis.  Patient initially noted to be in sinus rhythm with PACs by presenting ECG although with QT prolongation resulting in hold on Tikosyn direction of pharmacy.  He is in atrial fibrillation by my review of telemetry today.  Patient follows with Dr. Harl Bowie and Dr. Rayann Heman as an outpatient.  He was in fact just recently seen on August 10.  There has been discussion of adopting a strategy of heart rate control and anticoagulation if atrial fibrillation continues on Tikosyn.  Past Medical History:  Diagnosis Date  . Anxiety 02/13/2016  . Atrial flutter ([place]) 02/13/2016   s/p CTI ablation at [facility]  . DDD (degenerative disc disease), lumbar 02/13/2016  .  Essential hypertension 02/13/2016  . Insomnia 02/13/2016  . Mixed hyperlipidemia 02/13/2016  . Osteoarthritis of both hands   . Persistent atrial fibrillation ([place])   . RA (rheumatoid arthritis) ([place]) 02/13/2016   Sero Negative  . 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 Sleep apnea 02/13/2016    Past Surgical History:  Procedure Laterality Date  . A-FLUTTER ABLATION     at [facility] 2018  . CARDIOVERSION  02/10/2018  . CARDIOVERSION N/A 05/13/2018   Procedure: CARDIOVERSION;  Surgeon: Sanda Klein, MD;  Location: [facility] ENDOSCOPY;  Service: Cardiovascular;  Laterality: N/A;  . CATARACT EXTRACTION       Inpatient Medications: Scheduled Meds: . amoxicillin-clavulanate  1 tablet Oral Q12H  . benazepril  20 mg Oral Daily  . Chlorhexidine Gluconate Cloth  6 each Topical Daily  . dofetilide  500 mcg Oral BID  . ipratropium-albuterol  3 mL Nebulization Q6H  . methylPREDNISolone (SOLU-MEDROL) injection  60 mg Intravenous Q6H  . metoprolol tartrate  25 mg Oral BID  . potassium chloride SA  40 mEq Oral Q6H  . sodium chloride flush  3 mL Intravenous Q12H  . tamsulosin  0.4 mg Oral Daily  . warfarin  10 mg Oral ONCE-1600  . Warfarin - Pharmacist Dosing Inpatient   Does not apply q1600   Continuous Infusions: . sodium chloride     PRN Meds: sodium chloride, acetaminophen **OR** acetaminophen, polyethylene glycol, sodium chloride flush, sorbitol  Allergies:    Allergies  Allergen Reactions  . Celebrex [Celecoxib]   . Methotrexate Derivatives Other (See Comments)    Arrhythmia   . Sulfa Antibiotics     Social History:   Social History   Tobacco Use  . Smoking status: Current Every  Day Smoker    Packs/day: 2.00    Years: 35.00    Pack years: 70.00    Types: Cigarettes  . Smokeless tobacco: Never Used  Substance Use Topics  . Alcohol use: No    Family History:   The patient's family history includes Diabetes in his son; Epilepsy in his son; Heart attack in his father; Hypertension in  his father, sister, sister, and sister; Prostate cancer in his father.  ROS:  Please see the history of present illness.  All other ROS reviewed and negative.     Physical Exam/Data:   Vitals:   11/28/19 0400 11/28/19 0700 11/28/19 0749 11/28/19 0900  BP: (!) 104/54 (!) 103/51  (!) 117/93  Pulse: 99 98  (!) 104  Resp:      Temp: 98 F (36.7 C)   97.6 F (36.4 C)  TempSrc: Axillary   Oral  SpO2: 92% 90% 90% 92%  Weight:      Height:        Intake/Output Summary (Last 24 hours) at 11/28/2019 1103 Last data filed at 11/27/2019 1732 Gross per 24 hour  Intake 410.41 ml  Output 630 ml  Net -219.59 ml   Filed Weights   11/27/19 0943 11/27/19 2100  Weight: (!) 176.9 kg (!) 172.3 kg   Body mass index is 50.12 kg/m.   Gen: Morbidly obese male, no distress. HEENT: Conjunctiva and lids normal, oropharynx clear. Neck: Supple, increased girth, difficult to assess JVP. Lungs: Decreased breath sounds with prolonged expiratory phase but no wheezing. Cardiac: Distant, irregularly irregular rhythm, no S3. Abdomen: Obese, nontender, bowel sounds present. Extremities: Mild chronic appearing leg edema, distal pulses 2+. Skin: Warm and dry. Musculoskeletal: No kyphosis. Neuropsychiatric: Alert and oriented x3, affect grossly appropriate.  EKG:  An ECG dated 11/27/2019 was personally reviewed today and demonstrated:  Sinus rhythm with PACs, QTc 600 ms.  Telemetry:  I personally reviewed telemetry which shows atrial fibrillation.  Relevant CV Studies:  Echocardiogram 11/04/2018: 1. Images are limited.  2. The left ventricle has grossly normal systolic function, with an  ejection fraction of 55-60%. The cavity size was normal. There is mildly  increased left ventricular wall thickness. Left ventricular diastolic  Doppler parameters are indeterminate.  3. The right ventricle has normal systolic function. The cavity was  normal. There is no increase in right ventricular wall thickness.  Right  ventricular systolic pressure is normal with an estimated pressure of 12.5  mmHg.  4. The aortic valve is tricuspid. Mild calcification of the aortic valve.  Mild aortic annular calcification noted.  5. The mitral valve is grossly normal. There is mild mitral annular  calcification present.  6. The tricuspid valve is grossly normal.  7. The aorta is abnormal in size and structure.  8. There is mild dilatation of the aortic root.  9. The interatrial septum was not well visualized.   Laboratory Data:  Chemistry Recent Labs  Lab 11/27/19 1006 11/27/19 2122 11/28/19 0437  NA 138 137 138  K 2.8* 3.2* 3.2*  CL 85* 87* 88*  CO2 41* 37* 36*  GLUCOSE 116* 151* 141*  BUN 16 15 19   CREATININE 0.77 0.76 1.06  CALCIUM 8.5* 8.1* 8.4*  GFRNONAA >60 >60 >60  GFRAA >60 >60 >60  ANIONGAP 12 13 14     Recent Labs  Lab 11/27/19 1006 11/27/19 2122  PROT 7.0 6.9  ALBUMIN 3.2* 3.1*  AST 14* 15  ALT 10 10  ALKPHOS 65 65  BILITOT 1.0 1.1  Hematology Recent Labs  Lab 11/27/19 1006 11/27/19 2122 11/28/19 0437  WBC 8.8 10.1 8.8  RBC 4.71 4.65 4.81  HGB 14.2 13.9 14.4  HCT 45.5 45.1 47.1  MCV 96.6 97.0 97.9  MCH 30.1 29.9 29.9  MCHC 31.2 30.8 30.6  RDW 14.6 14.8 14.3  PLT 194 199 200   Cardiac Enzymes Recent Labs  Lab 11/27/19 1006 11/27/19 1208 11/27/19 2122 11/27/19 2317  TROPONINIHS 63* 63* 45* 39*   BNP Recent Labs  Lab 11/27/19 1006 11/27/19 2122  BNP 296.0* 323.0*     Radiology/Studies:  CT Angio Chest PE W and/or Wo Contrast  Result Date: 11/27/2019 CLINICAL DATA:  Chest pain or SOB, pleurisy or effusion suspected EXAM: CT ANGIOGRAPHY CHEST WITH CONTRAST TECHNIQUE: Multidetector CT imaging of the chest was performed using the standard protocol during bolus administration of intravenous contrast. Multiplanar CT image reconstructions and MIPs were obtained to evaluate the vascular anatomy. CONTRAST:  151mL OMNIPAQUE IOHEXOL 350 MG/ML SOLN  COMPARISON:  Radiograph earlier this day. FINDINGS: Cardiovascular: Contrast bolus timing is suboptimal for assessment of pulmonary embolus. Evaluation is diagnostic to the lobar level. There are no central pulmonary arterial filling defects. The segmental and subsegmental branches cannot be assessed due to contrast bolus timing and soft tissue attenuation from habitus. Dilatation of the main pulmonary artery at 4.2 cm. Aortic atherosclerosis. Aortic tortuosity. No aortic aneurysm. Mild multi chamber cardiomegaly. There are coronary artery calcifications. No pericardial effusion. Mediastinum/Nodes: Generalized mediastinal lipomatosis. Scattered small mediastinal lymph nodes not enlarged by size criteria. No hilar adenopathy. No esophageal thickening. No visualized thyroid nodule. Lungs/Pleura: Elevation of left hemidiaphragm with adjacent compressive atelectasis at the left lower lobe and lingula. Dependent opacity in the right lower lobe may be atelectasis or pneumonia. No significant pleural fluid. Vascular congestion with minor septal thickening/pulmonary edema. No evidence of pulmonary mass. Upper Abdomen: Enlarged liver partially included. 2.5 cm cyst in the left lobe. Questionable gallbladder wall thickening versus motion artifact. Musculoskeletal: Diffuse multilevel degenerative change in the spine. There are no acute or suspicious osseous abnormalities. Review of the MIP images confirms the above findings. IMPRESSION: 1. Suboptimal contrast bolus timing for assessment of pulmonary embolus. No central pulmonary arterial filling defects to the lobar level. The segmental and subsegmental branches cannot be assessed due to contrast bolus timing and soft tissue attenuation from habitus. 2. Dilatation of the main pulmonary artery suggesting pulmonary arterial hypertension. 3. Elevation of left hemidiaphragm with adjacent compressive atelectasis at the left lower lobe and lingula. Dependent opacity in the right  lower lobe may be atelectasis or pneumonia. 4. Cardiomegaly with minor pulmonary edema. 5. Questionable gallbladder wall thickening versus motion artifact. 6. Coronary artery calcifications. Aortic Atherosclerosis (ICD10-I70.0). Electronically Signed   By: Keith Rake M.D.   On: 11/27/2019 19:08   DG Chest Port 1 View  Result Date: 11/27/2019 CLINICAL DATA:  Short of breath on exertion. History of atrial fibrillation. EXAM: PORTABLE CHEST 1 VIEW COMPARISON:  None. FINDINGS: Mildly degraded exam due to AP portable technique and patient body habitus. Midline trachea. Marked cardiomegaly. Small left and probable small right pleural effusions. No pneumothorax. Pulmonary interstitial thickening, accentuated by low lung volumes. Bibasilar airspace disease. IMPRESSION: Cardiomegaly and mild pulmonary venous congestion. Low lung volumes with small bilateral pleural effusions and bibasilar airspace disease, most likely atelectasis. Limitations secondary to patient's size as detailed above. Electronically Signed   By: Abigail Miyamoto M.D.   On: 11/27/2019 10:47    Assessment and Plan:   1.  Persistent atrial fibrillation and prior history of typical atrial flutter status post ablation in 2017.  Patient initially failed sotalol, and has been on Tikosyn most recently per Dr. Rayann Heman, although discussions about adopting a rate control strategy have been had.  He presented in sinus rhythm with prolonged QTc, Tikosyn was held per direction of pharmacy, he is in persistent atrial fibrillation now and not overly symptomatic.  CHA2DS2-VASc score is 3.  He is on Coumadin for stroke prophylaxis.  2.  Presentation with acute hypoxic respiratory failure, possibly component of vascular congestion also suspected lung disease with longstanding tobacco use.  3.  Morbid obesity with OSA.  4.  Essential hypertension.  Agree with holding Tikosyn.  Follow-up ECG today with patient in atrial fibrillation, will repeat tracing  tomorrow.  Honestly, the likelihood of his maintaining sinus rhythm long-term is fairly low in the setting of morbid obesity with OSA and suspected chronic lung disease with tobacco use.  It might make sense to try heart rate control strategy if he tolerates.  Will increase his standing Lopressor, otherwise continue Coumadin.  Not excluding resumption of lower dose Tikosyn, but if he tolerates atrial fibrillation rate controlled, this may not be necessary.  Follow-up echocardiogram to ensure no change in LVEF.  Signed, Rozann Lesches, MD  11/28/2019 11:03 AM

## 2019-11-29 LAB — MAGNESIUM: Magnesium: 2.2 mg/dL (ref 1.7–2.4)

## 2019-11-29 LAB — PROTIME-INR
INR: 2.6 — ABNORMAL HIGH (ref 0.8–1.2)
Prothrombin Time: 27 seconds — ABNORMAL HIGH (ref 11.4–15.2)

## 2019-11-29 LAB — BASIC METABOLIC PANEL WITH GFR
Anion gap: 12 (ref 5–15)
BUN: 30 mg/dL — ABNORMAL HIGH (ref 8–23)
CO2: 37 mmol/L — ABNORMAL HIGH (ref 22–32)
Calcium: 8.6 mg/dL — ABNORMAL LOW (ref 8.9–10.3)
Chloride: 88 mmol/L — ABNORMAL LOW (ref 98–111)
Creatinine, Ser: 0.87 mg/dL (ref 0.61–1.24)
GFR calc Af Amer: >60 mL/min
GFR calc non Af Amer: >60 mL/min
Glucose, Bld: 279 mg/dL — ABNORMAL HIGH (ref 70–99)
Potassium: 3.4 mmol/L — ABNORMAL LOW (ref 3.5–5.1)
Sodium: 137 mmol/L (ref 135–145)

## 2019-11-29 LAB — BASIC METABOLIC PANEL
Anion gap: 12 (ref 5–15)
BUN: 31 mg/dL — ABNORMAL HIGH (ref 8–23)
CO2: 37 mmol/L — ABNORMAL HIGH (ref 22–32)
Calcium: 8.8 mg/dL — ABNORMAL LOW (ref 8.9–10.3)
Chloride: 89 mmol/L — ABNORMAL LOW (ref 98–111)
Creatinine, Ser: 0.77 mg/dL (ref 0.61–1.24)
GFR calc Af Amer: >60 mL/min (ref >60)
GFR calc non Af Amer: >60 mL/min (ref >60)
Glucose, Bld: 174 mg/dL — ABNORMAL HIGH (ref 70–99)
Potassium: 3.5 mmol/L (ref 3.5–5.1)
Sodium: 138 mmol/L (ref 135–145)

## 2019-11-29 MED ORDER — SODIUM CHLORIDE 0.9 % IV SOLN: 250.0000 mL | INTRAVENOUS | Status: DC | PRN

## 2019-11-29 MED ORDER — DOFETILIDE 125 MCG PO CAPS
250.0000 ug | ORAL_CAPSULE | Freq: Two times a day (BID) | ORAL | Status: DC
  Administered 2019-11-29 – 2019-11-30 (×3): 250 ug via ORAL
  Filled 2019-11-29 (×2): qty 2
  Filled 2019-11-29: qty 1
  Filled 2019-11-29 (×2): qty 2
  Filled 2019-11-29 (×2): qty 1

## 2019-11-29 MED ORDER — POTASSIUM CHLORIDE CRYS ER 20 MEQ PO TBCR
40.0000 meq | EXTENDED_RELEASE_TABLET | Freq: Once | ORAL | Status: AC
  Administered 2019-11-29: 40 meq via ORAL
  Filled 2019-11-29: qty 2

## 2019-11-29 MED ORDER — POTASSIUM CHLORIDE CRYS ER 20 MEQ PO TBCR: 20.0000 meq | EXTENDED_RELEASE_TABLET | Freq: Two times a day (BID) | ORAL | Status: DC

## 2019-11-29 MED ORDER — SODIUM CHLORIDE 0.9% FLUSH: 3.0000 mL | INTRAVENOUS | Status: DC | PRN

## 2019-11-29 MED ORDER — WARFARIN SODIUM 5 MG PO TABS
10.0000 mg | ORAL_TABLET | Freq: Once | ORAL | Status: AC
  Administered 2019-11-29: 10 mg via ORAL
  Filled 2019-11-29: qty 2

## 2019-11-29 MED ORDER — SODIUM CHLORIDE 0.9% FLUSH
3.0000 mL | Freq: Two times a day (BID) | INTRAVENOUS | Status: DC
  Administered 2019-11-29 – 2019-11-30 (×2): 3 mL via INTRAVENOUS

## 2019-11-29 NOTE — TOC Progression Note (Addendum)
Transition of Care [facility]) - Progression Note    Patient Details  Name: Jesus Rodriguez MRN: [id number] Date of Birth: 1950/09/16  [facility] CM/SW Contact  Natasha Bence, LCSW Phone Number: 11/29/2019, 10:35 AM  Clinical Narrative:    CSW received consult for HHPT, HHRN and aid. CSW contacted Georgina Snell with [facility] to inquire if they would be able to provide services to patient. [facility] agreeable to provide services. CSW will notify Georgina Snell with [facility] once patient has been discharged. TOC to follow.      Barriers to Discharge: Continued Medical Work up  Expected Discharge Plan and Services                           DME Arranged: Specialty mattress           HH Agency: [facility] Date [place]: 11/29/19 Time [place]: 1034 Representative spoke with at [place]: [place] Determinants of Health ([place]) Interventions    Readmission Risk Interventions No flowsheet data found.

## 2019-11-29 NOTE — Progress Notes (Signed)
Patient Demographics:    Jesus Rodriguez, is a 69 y.o. male, DOB - 1950/10/05, WIO:[id number]  Admit date - 11/27/2019   Admitting Physician Vashti Hey, MD  Outpatient Primary MD for the patient is Patient, [name]  LOS - 2   Chief Complaint  Patient presents with  . Shortness of Breath        Subjective:    Jesus Rodriguez today has no fevers, no emesis,  No chest pain,   -Dyspnea on exertion, shortness of breath and hypoxia improving slowly but oxygen requirement still above patient's preadmission baseline - Voiding Well --Patient will need home health PT, RN and aide upon discharge  Assessment  & Plan :    Principal Problem:   Acute respiratory failure with hypoxia [facility]) Active Problems:   Hypertension   Sleep apnea   Atrial fibrillation (HCC)   Pressure injury of skin  Brief Summary:- 69 y.o. male with a history of atrial flutter status post ablation, persistent atrial fibrillation, rheumatoid arthritis, OSA, hypertension, hyperlipidemia, and obesity admitted on 11/27/2019 with acute respiratory failure with hypoxia with concern for diastolic CHF exacerbation, cannot rule out concomitant COPD exacerbation  A/p 1)HFpEF/acute diastolic dysfunction CHF exacerbation--- continue IV Lasix 40 mg daily, daily weights and fluid input and output monitoring -Cardiology input appreciated  2) persistent Afib/history of atrial flutter status post ablation in 2017 --- patient found to have prolonged QT while on Tikosyn, Tikosyn was held on 8/15 and 8/16/ 2021 -Patient previously failed sotalol, -Continue Coumadin -Cardiology consult appreciated recommends increasing metoprolol to 50 twice daily -Tikosyn restarted at 250 mg twice daily on 11/29/19--per cardiologist hold off on discharge until patient has been monitored on Tikosyn for at least 24 to 48 hours, if QT becomes prolonged again  cardiologist may consider discontinuation of Tikosyn altogether  3) COPD exacerbation--- patient is a heavy smoker smoking to 1/2 packs a day -Continue IV Solu-Medrol, bronchodilators and mucolytics --Continue Augmentin -Patient is not interested in smoking cessation  4) acute on presumed chronic respiratory failure with hypoxia----secondary to #1 and #3 above--- compounded by ongoing heavy tobacco use--- manage as above #1 #3 -PTA patient used oxygen at 2 L/min her home, Now requiring 4 L of oxygen  5)OSA----continue CPAP nightly  6)HTN--continue benazepril 20 mg daily, metoprolol was increased to 50 mg twice daily more so for rate control  7)Morbid Obesity- -Low calorie diet, portion control and increase physical activity discussed with patient -Body mass index is 50.12 kg/m.  8) generalized weakness and deconditioning--PT eval appreciated recommends home health PT  Pressure Injury --POA--- Two lesions on bilateral buttocks, each measures 9cm x 2cm. Skin is not broken, areas do not blanch--- wound care consult appreciated  Disposition/Need for in-Hospital Stay- patient unable to be discharged at this time due to --- diastolic dysfunction heart failure exacerbation with persistent hypoxia requiring further IV diuresis and prolonged QT requiring further adjustment of cardiac medications -Patient will need home health PT, RN and aide upon discharge  Status is: Inpatient  Remains inpatient appropriate because:diastolic dysfunction heart failure exacerbation with persistent hypoxia requiring further IV diuresis and prolonged QT requiring further adjustment of cardiac medications   Disposition: The patient is from: Home  Anticipated d/c is to: Home              Anticipated d/c date is: 2 days              Patient currently is not medically stable to d/c. Barriers: Not Clinically Stable- -diastolic dysfunction heart failure exacerbation with persistent hypoxia requiring  further IV diuresis and prolonged QT requiring further adjustment of cardiac medications -Patient will need home health PT, RN and aide upon discharge Code Status : full code  Family Communication:   (patient is alert, awake and coherent)   Consults  :  cardiology  DVT Prophylaxis  : coumadin /SCDs   Lab Results  Component Value Date   PLT 200 11/28/2019    Inpatient Medications  Scheduled Meds: . amoxicillin-clavulanate  1 tablet Oral Q12H  . benazepril  20 mg Oral Daily  . Chlorhexidine Gluconate Cloth  6 each Topical Daily  . dofetilide  250 mcg Oral BID  . furosemide  40 mg Intravenous Daily  . ipratropium-albuterol  3 mL Nebulization TID  . methylPREDNISolone (SOLU-MEDROL) injection  60 mg Intravenous Q6H  . metoprolol tartrate  50 mg Oral BID  . sodium chloride flush  3 mL Intravenous Q12H  . sodium chloride flush  3 mL Intravenous Q12H  . tamsulosin  0.4 mg Oral Daily  . Warfarin - Pharmacist Dosing Inpatient   Does not apply q1600   Continuous Infusions: . sodium chloride    . sodium chloride     PRN Meds:.sodium chloride, sodium chloride, acetaminophen **OR** acetaminophen, polyethylene glycol, sodium chloride flush, sodium chloride flush, sorbitol    Anti-infectives (From admission, onward)   Start     Dose/Rate Route Frequency Ordered Stop   11/27/19 2200  amoxicillin-clavulanate (AUGMENTIN) 875-125 MG per tablet 1 tablet     Discontinue     1 tablet Oral Every 12 hours 11/27/19 2126 12/02/19 2159        Objective:   Vitals:   11/29/19 1600 11/29/19 1702 11/29/19 1800 11/29/19 1900  BP: 128/65 (!) 117/58 124/60 (!) 105/56  Pulse: 94 81 (!) 116 (!) 144  Resp:      Temp:  97.8 F (36.6 C)    TempSrc:  Oral    SpO2: 95% 94% 94% 95%  Weight:      Height:        Wt Readings from Last 3 Encounters:  11/29/19 (!) 173 kg  11/22/19 (!) 176 kg  10/25/19 (!) 181 kg     Intake/Output Summary (Last 24 hours) at 11/29/2019 2046 Last data filed at  11/29/2019 1811 Gross per 24 hour  Intake --  Output 3000 ml  Net -3000 ml   Physical Exam  Gen:- Awake Alert, morbidly obese, dyspnea with minimal activity HEENT:- [place].AT, No sclera icterus Nose- [place] 4L/min Neck-Supple Neck,No JVD,.  Lungs-diminished in bases, no wheezing  CV- S1, S2 normal, irregularly irregular Abd-  +ve B.Sounds, Abd Soft, No tenderness, increased truncal adiposity Extremity/Skin:-Chronic venous stasis, pedal pulses present  Psych-affect is appropriate, oriented x3 Neuro-no new focal deficits, no tremors   Data Review:   Micro Results Recent Results (from the past 240 hour(s))  SARS Coronavirus 2 by RT PCR (hospital order, performed in [facility] hospital lab) Nasopharyngeal Nasopharyngeal Swab     Status: None   Collection Time: 11/27/19 11:21 AM   Specimen: Nasopharyngeal Swab  Result Value Ref Range Status   SARS Coronavirus 2 NEGATIVE NEGATIVE Final    Comment: (NOTE) SARS-CoV-2 target  nucleic acids are NOT DETECTED.  The SARS-CoV-2 RNA is generally detectable in upper and lower respiratory specimens during the acute phase of infection. The lowest concentration of SARS-CoV-2 viral copies this assay can detect is 250 copies / mL. A negative result does not preclude SARS-CoV-2 infection and should not be used as the sole basis for treatment or other patient management decisions.  A negative result may occur with improper specimen collection / handling, submission of specimen other than nasopharyngeal swab, presence of viral mutation(s) within the areas targeted by this assay, and inadequate number of viral copies (<250 copies / mL). A negative result must be combined with clinical observations, patient history, and epidemiological information.  Fact Sheet for Patients:   StrictlyIdeas.no  Fact Sheet for Healthcare Providers: BankingDealers.co.za  This test is not yet approved or  cleared by the [place] FDA and has been authorized for detection and/or diagnosis of SARS-CoV-2 by FDA under an Emergency Use Authorization (EUA).  This EUA will remain in effect (meaning this test can be used) for the duration of the COVID-19 declaration under Section 564(b)(1) of the Act, 21 U.S.C. section 360bbb-3(b)(1), unless the authorization is terminated or revoked sooner.  Performed at [facility], [address], [place], [place] [address]   MRSA PCR Screening     Status: None   Collection Time: 11/27/19  8:20 PM   Specimen: Nasal Mucosa; Nasopharyngeal  Result Value Ref Range Status   MRSA by PCR NEGATIVE NEGATIVE Final    Comment:        The GeneXpert MRSA Assay (FDA approved for NASAL specimens only), is one component of a comprehensive MRSA colonization surveillance program. It is not intended to diagnose MRSA infection nor to guide or monitor treatment for MRSA infections. Performed at [facility], [address] [place], [place] [address]     Radiology Reports CT Angio Chest PE W and/or Wo Contrast  Result Date: 11/27/2019 CLINICAL DATA:  Chest pain or SOB, pleurisy or effusion suspected EXAM: CT ANGIOGRAPHY CHEST WITH CONTRAST TECHNIQUE: Multidetector CT imaging of the chest was performed using the standard protocol during bolus administration of intravenous contrast. Multiplanar CT image reconstructions and MIPs were obtained to evaluate the vascular anatomy. CONTRAST:  126mL OMNIPAQUE IOHEXOL 350 MG/ML SOLN COMPARISON:  Radiograph earlier this day. FINDINGS: Cardiovascular: Contrast bolus timing is suboptimal for assessment of pulmonary embolus. Evaluation is diagnostic to the lobar level. There are no central pulmonary arterial filling defects. The segmental and subsegmental branches cannot be assessed due to contrast bolus timing and soft tissue attenuation from habitus. Dilatation of the main pulmonary artery at 4.2 cm. Aortic atherosclerosis. Aortic tortuosity. No aortic  aneurysm. Mild multi chamber cardiomegaly. There are coronary artery calcifications. No pericardial effusion. Mediastinum/Nodes: Generalized mediastinal lipomatosis. Scattered small mediastinal lymph nodes not enlarged by size criteria. No hilar adenopathy. No esophageal thickening. No visualized thyroid nodule. Lungs/Pleura: Elevation of left hemidiaphragm with adjacent compressive atelectasis at the left lower lobe and lingula. Dependent opacity in the right lower lobe may be atelectasis or pneumonia. No significant pleural fluid. Vascular congestion with minor septal thickening/pulmonary edema. No evidence of pulmonary mass. Upper Abdomen: Enlarged liver partially included. 2.5 cm cyst in the left lobe. Questionable gallbladder wall thickening versus motion artifact. Musculoskeletal: Diffuse multilevel degenerative change in the spine. There are no acute or suspicious osseous abnormalities. Review of the MIP images confirms the above findings. IMPRESSION: 1. Suboptimal contrast bolus timing for assessment of pulmonary embolus. No central pulmonary arterial filling  defects to the lobar level. The segmental and subsegmental branches cannot be assessed due to contrast bolus timing and soft tissue attenuation from habitus. 2. Dilatation of the main pulmonary artery suggesting pulmonary arterial hypertension. 3. Elevation of left hemidiaphragm with adjacent compressive atelectasis at the left lower lobe and lingula. Dependent opacity in the right lower lobe may be atelectasis or pneumonia. 4. Cardiomegaly with minor pulmonary edema. 5. Questionable gallbladder wall thickening versus motion artifact. 6. Coronary artery calcifications. Aortic Atherosclerosis (ICD10-I70.0). Electronically Signed   By: Keith Rake M.D.   On: 11/27/2019 19:08   DG Chest Port 1 View  Result Date: 11/27/2019 CLINICAL DATA:  Short of breath on exertion. History of atrial fibrillation. EXAM: PORTABLE CHEST 1 VIEW COMPARISON:  None.  FINDINGS: Mildly degraded exam due to AP portable technique and patient body habitus. Midline trachea. Marked cardiomegaly. Small left and probable small right pleural effusions. No pneumothorax. Pulmonary interstitial thickening, accentuated by low lung volumes. Bibasilar airspace disease. IMPRESSION: Cardiomegaly and mild pulmonary venous congestion. Low lung volumes with small bilateral pleural effusions and bibasilar airspace disease, most likely atelectasis. Limitations secondary to patient's size as detailed above. Electronically Signed   By: Abigail Miyamoto M.D.   On: 11/27/2019 10:47     CBC Recent Labs  Lab 11/27/19 1006 11/27/19 2122 11/28/19 0437  WBC 8.8 10.1 8.8  HGB 14.2 13.9 14.4  HCT 45.5 45.1 47.1  PLT 194 199 200  MCV 96.6 97.0 97.9  MCH 30.1 29.9 29.9  MCHC 31.2 30.8 30.6  RDW 14.6 14.8 14.3  LYMPHSABS 1.0 0.4*  --   MONOABS 0.6 0.2  --   EOSABS 0.2 0.0  --   BASOSABS 0.1 0.1  --     Chemistries  Recent Labs  Lab 11/27/19 1006 11/27/19 2122 11/28/19 0437 11/29/19 0504 11/29/19 1004  NA 138 137 138 138 137  K 2.8* 3.2* 3.2* 3.5 3.4*  CL 85* 87* 88* 89* 88*  CO2 41* 37* 36* 37* 37*  GLUCOSE 116* 151* 141* 174* 279*  BUN 16 15 19  31* 30*  CREATININE 0.77 0.76 1.06 0.77 0.87  CALCIUM 8.5* 8.1* 8.4* 8.8* 8.6*  MG  --  2.2  --   --  2.2  AST 14* 15  --   --   --   ALT 10 10  --   --   --   ALKPHOS 65 65  --   --   --   BILITOT 1.0 1.1  --   --   --    ------------------------------------------------------------------------------------------------------------------ No results for input(s): CHOL, HDL, LDLCALC, TRIG, CHOLHDL, LDLDIRECT in the last 72 hours.  No results found for: HGBA1C ------------------------------------------------------------------------------------------------------------------ No results for input(s): TSH, T4TOTAL, T3FREE, THYROIDAB in the last 72 hours.  Invalid input(s):  FREET3 ------------------------------------------------------------------------------------------------------------------ No results for input(s): VITAMINB12, FOLATE, FERRITIN, TIBC, IRON, RETICCTPCT in the last 72 hours.  Coagulation profile Recent Labs  Lab 11/27/19 2122 11/28/19 0437 11/29/19 0504  INR 3.0* 2.7* 2.6*    No results for input(s): DDIMER in the last 72 hours.  Cardiac Enzymes No results for input(s): CKMB, TROPONINI, MYOGLOBIN in the last 168 hours.  Invalid input(s): CK ------------------------------------------------------------------------------------------------------------------    Component Value Date/Time   BNP 323.0 (H) 11/27/2019 2122     Roxan Hockey M.D on 11/29/2019 at 8:46 PM  Go to [email or web address] - for contact info  [facility] - Office  [phone]

## 2019-11-29 NOTE — Progress Notes (Signed)
Patient had home unit at bedside.  Patient has placed himself on for the night.  Will continue to monitor.

## 2019-11-29 NOTE — Progress Notes (Signed)
Progress Note  Patient Name: Jesus Rodriguez Date of Encounter: 11/29/2019  Primary Cardiologist: Carlyle Dolly, MD  Subjective   No chest pain or palpitations.  Working with PT this morning.  Inpatient Medications    Scheduled Meds: . amoxicillin-clavulanate  1 tablet Oral Q12H  . benazepril  20 mg Oral Daily  . Chlorhexidine Gluconate Cloth  6 each Topical Daily  . furosemide  40 mg Intravenous Daily  . ipratropium-albuterol  3 mL Nebulization TID  . methylPREDNISolone (SOLU-MEDROL) injection  60 mg Intravenous Q6H  . metoprolol tartrate  50 mg Oral BID  . sodium chloride flush  3 mL Intravenous Q12H  . tamsulosin  0.4 mg Oral Daily  . warfarin  10 mg Oral ONCE-1600  . Warfarin - Pharmacist Dosing Inpatient   Does not apply q1600   Continuous Infusions: . sodium chloride     PRN Meds: sodium chloride, acetaminophen **OR** acetaminophen, polyethylene glycol, sodium chloride flush, sorbitol   Vital Signs    Vitals:   11/29/19 0500 11/29/19 0600 11/29/19 0700 11/29/19 0719  BP: (!) 149/91 (!) 152/83 (!) 142/71   Pulse: 95 69 73 97  Resp:      Temp:    97.8 F (36.6 C)  TempSrc:    Axillary  SpO2: 92% 93% 91% 91%  Weight: (!) 173 kg     Height:        Intake/Output Summary (Last 24 hours) at 11/29/2019 0926 Last data filed at 11/29/2019 0500 Gross per 24 hour  Intake --  Output 1750 ml  Net -1750 ml   Filed Weights   11/27/19 0943 11/27/19 2100 11/29/19 0500  Weight: (!) 176.9 kg (!) 172.3 kg (!) 173 kg    Telemetry    Atrial fibrillation.  Personally reviewed.  ECG    An ECG dated 11/29/2019 was personally reviewed today and demonstrated:  Rate controlled atrial fibrillation, QTc normal.  Physical Exam   GEN:  Morbidly obese male, no acute distress.   Neck: No JVD. Cardiac:  Irregularly irregular.  Respiratory:  Decreased breath sounds without active wheezing. GI: Soft, nontender, bowel sounds present. MS:  Mild lower leg edema; No  deformity.  Labs    Chemistry Recent Labs  Lab 11/27/19 1006 11/27/19 1006 11/27/19 2122 11/28/19 0437 11/29/19 0504  NA 138   < > 137 138 138  K 2.8*   < > 3.2* 3.2* 3.5  CL 85*   < > 87* 88* 89*  CO2 41*   < > 37* 36* 37*  GLUCOSE 116*   < > 151* 141* 174*  BUN 16   < > 15 19 31*  CREATININE 0.77   < > 0.76 1.06 0.77  CALCIUM 8.5*   < > 8.1* 8.4* 8.8*  PROT 7.0  --  6.9  --   --   ALBUMIN 3.2*  --  3.1*  --   --   AST 14*  --  15  --   --   ALT 10  --  10  --   --   ALKPHOS 65  --  65  --   --   BILITOT 1.0  --  1.1  --   --   GFRNONAA >60   < > >60 >60 >60  GFRAA >60   < > >60 >60 >60  ANIONGAP 12   < > 13 14 12    < > = values in this interval not displayed.     Hematology Recent Labs  Lab 11/27/19 1006 11/27/19 2122 11/28/19 0437  WBC 8.8 10.1 8.8  RBC 4.71 4.65 4.81  HGB 14.2 13.9 14.4  HCT 45.5 45.1 47.1  MCV 96.6 97.0 97.9  MCH 30.1 29.9 29.9  MCHC 31.2 30.8 30.6  RDW 14.6 14.8 14.3  PLT 194 199 200    Cardiac Enzymes Recent Labs  Lab 11/27/19 1006 11/27/19 1208 11/27/19 2122 11/27/19 2317  TROPONINIHS 63* 63* 45* 39*    BNP Recent Labs  Lab 11/27/19 1006 11/27/19 2122  BNP 296.0* 323.0*     Radiology    CT Angio Chest PE W and/or Wo Contrast  Result Date: 11/27/2019 CLINICAL DATA:  Chest pain or SOB, pleurisy or effusion suspected EXAM: CT ANGIOGRAPHY CHEST WITH CONTRAST TECHNIQUE: Multidetector CT imaging of the chest was performed using the standard protocol during bolus administration of intravenous contrast. Multiplanar CT image reconstructions and MIPs were obtained to evaluate the vascular anatomy. CONTRAST:  120mL OMNIPAQUE IOHEXOL 350 MG/ML SOLN COMPARISON:  Radiograph earlier this day. FINDINGS: Cardiovascular: Contrast bolus timing is suboptimal for assessment of pulmonary embolus. Evaluation is diagnostic to the lobar level. There are no central pulmonary arterial filling defects. The segmental and subsegmental branches cannot  be assessed due to contrast bolus timing and soft tissue attenuation from habitus. Dilatation of the main pulmonary artery at 4.2 cm. Aortic atherosclerosis. Aortic tortuosity. No aortic aneurysm. Mild multi chamber cardiomegaly. There are coronary artery calcifications. No pericardial effusion. Mediastinum/Nodes: Generalized mediastinal lipomatosis. Scattered small mediastinal lymph nodes not enlarged by size criteria. No hilar adenopathy. No esophageal thickening. No visualized thyroid nodule. Lungs/Pleura: Elevation of left hemidiaphragm with adjacent compressive atelectasis at the left lower lobe and lingula. Dependent opacity in the right lower lobe may be atelectasis or pneumonia. No significant pleural fluid. Vascular congestion with minor septal thickening/pulmonary edema. No evidence of pulmonary mass. Upper Abdomen: Enlarged liver partially included. 2.5 cm cyst in the left lobe. Questionable gallbladder wall thickening versus motion artifact. Musculoskeletal: Diffuse multilevel degenerative change in the spine. There are no acute or suspicious osseous abnormalities. Review of the MIP images confirms the above findings. IMPRESSION: 1. Suboptimal contrast bolus timing for assessment of pulmonary embolus. No central pulmonary arterial filling defects to the lobar level. The segmental and subsegmental branches cannot be assessed due to contrast bolus timing and soft tissue attenuation from habitus. 2. Dilatation of the main pulmonary artery suggesting pulmonary arterial hypertension. 3. Elevation of left hemidiaphragm with adjacent compressive atelectasis at the left lower lobe and lingula. Dependent opacity in the right lower lobe may be atelectasis or pneumonia. 4. Cardiomegaly with minor pulmonary edema. 5. Questionable gallbladder wall thickening versus motion artifact. 6. Coronary artery calcifications. Aortic Atherosclerosis (ICD10-I70.0). Electronically Signed   By: Keith Rake M.D.   On:  11/27/2019 19:08   DG Chest Port 1 View  Result Date: 11/27/2019 CLINICAL DATA:  Short of breath on exertion. History of atrial fibrillation. EXAM: PORTABLE CHEST 1 VIEW COMPARISON:  None. FINDINGS: Mildly degraded exam due to AP portable technique and patient body habitus. Midline trachea. Marked cardiomegaly. Small left and probable small right pleural effusions. No pneumothorax. Pulmonary interstitial thickening, accentuated by low lung volumes. Bibasilar airspace disease. IMPRESSION: Cardiomegaly and mild pulmonary venous congestion. Low lung volumes with small bilateral pleural effusions and bibasilar airspace disease, most likely atelectasis. Limitations secondary to patient's size as detailed above. Electronically Signed   By: Abigail Miyamoto M.D.   On: 11/27/2019 10:47    Cardiac Studies   Echocardiogram 11/04/2018: 1.  Images are limited.  2. The left ventricle has grossly normal systolic function, with an  ejection fraction of 55-60%. The cavity size was normal. There is mildly  increased left ventricular wall thickness. Left ventricular diastolic  Doppler parameters are indeterminate.  3. The right ventricle has normal systolic function. The cavity was  normal. There is no increase in right ventricular wall thickness. Right  ventricular systolic pressure is normal with an estimated pressure of 12.5  mmHg.  4. The aortic valve is tricuspid. Mild calcification of the aortic valve.  Mild aortic annular calcification noted.  5. The mitral valve is grossly normal. There is mild mitral annular  calcification present.  6. The tricuspid valve is grossly normal.  7. The aorta is abnormal in size and structure.  8. There is mild dilatation of the aortic root.  9. The interatrial septum was not well visualized.   Patient Profile     69 y.o. male with a history of atrial flutter status post ablation, persistent atrial fibrillation, rheumatoid arthritis, OSA, hypertension,  hyperlipidemia, and obesity.  Assessment & Plan    1.  Persistent atrial fibrillation as well as previous history of typical atrial flutter ablation (2017).  CHA2DS2-VASc score is 3.  Remains in atrial fibrillation, beta-blocker uptitrated, also continues on Coumadin for stroke prophylaxis.  2.  Prolonged QTc on Tikosyn, was on 500 mcg twice daily dose which was discontinued.  Follow-up QTc now normalized at 474 ms.  3.  Acute hypoxic respiratory failure, possibly chronic COPD with lung disease suspected in the setting of longstanding tobacco abuse.  4.  OSA on CPAP.  Plan to restart Tikosyn 250 mcg twice daily.  Continue Coumadin and metoprolol at present doses.  Follow-up ECG, BMET with magnesium in a.m.  Signed, Rozann Lesches, MD  11/29/2019, 9:26 AM

## 2019-11-29 NOTE — Progress Notes (Signed)
**Note De-identified Jesus Rodriguez Obfuscation** EKG complete and placed in patient chart 

## 2019-11-29 NOTE — Progress Notes (Signed)
ANTICOAGULATION CONSULT NOTE -   Pharmacy Consult for warfarin dosing Indication: atrial fibrillation  Allergies  Allergen Reactions  . Celebrex [Celecoxib]   . Methotrexate Derivatives Other (See Comments)    Arrhythmia   . Sulfa Antibiotics     Patient Measurements: Height: 6\' 1"  (185.4 cm) Weight: (!) 173 kg (381 lb 6.3 oz) IBW/kg (Calculated) : 79.9 Heparin Dosing Weight: HEPARIN DW (KG): 121.6  Vital Signs: Temp: 97.8 F (36.6 C) (08/17 0719) Temp Source: Axillary (08/17 0719) BP: 152/83 (08/17 0600) Pulse Rate: 97 (08/17 0719)  Labs: Recent Labs    11/27/19 1006 11/27/19 1006 11/27/19 1208 11/27/19 2122 11/27/19 2317 11/28/19 0437 11/29/19 0504  HGB 14.2   < >  --  13.9  --  14.4  --   HCT 45.5  --   --  45.1  --  47.1  --   PLT 194  --   --  199  --  200  --   LABPROT  --   --   --  29.9*  --  27.4* 27.0*  INR  --   --   --  3.0*  --  2.7* 2.6*  CREATININE 0.77   < >  --  0.76  --  1.06 0.77  TROPONINIHS 63*   < > 63* 45* 39*  --   --    < > = values in this interval not displayed.    Estimated Creatinine Clearance: 144.3 mL/min (by C-G formula based on SCr of 0.77 mg/dL).     Assessment: Pharmacy consulted to dose warfarin  for this 69 yo male on chronic anti-coagulation with warfarin for atrial fibrillation. Baseline CBC is WNL.  Home dose: warfarin 5mg  on Sun/Tues/Thurs                       warfarin 10mg  on Mon/Wed/Fri/Sat  Goal of Therapy:  INR 2-3 Monitor platelets by anticoagulation protocol: Yes   Plan:  Give  warfarin 10mg  x1 dose tonight- for INR 2.6  Daily PT/INR and every other day CBC Monitor patient for signs and symptoms of bleeding.  Khalil Szczepanik 11/29/2019,7:41 AM

## 2019-11-29 NOTE — Progress Notes (Signed)
Pharmacy Review for Dofetilide (Tikosyn)    Admit Complaint: 69 y.o. male admitted 11/27/2019 with atrial fibrillation to be continued on dofetilide.    Assessment: QTc: 468ms normalized this morning Patient took his last dose of dofetilide 523mcg 8/15 morning K is 3.5 and is being replace by Dr Domenic Polite Mg level 2.2 last checked 8/15. Rechecking this morning  CrCl> 100cc/hr   Goal of Therapy: Follow renal function, electrolytes, potential drug interactions, and dose adjustment. Provide education and 1 week supply at discharge.  Plan:  1. Dofetilide 268mcg po BID, ECG in AM per Dr Domenic Polite.  2. Follow up QTc, renal function, electrolytes (K & Mg) daily x 3 days and make dose adjustments as necessary.     Donna Christen Cambell Stanek 9:44 AM 11/29/2019

## 2019-11-29 NOTE — Plan of Care (Addendum)
  Problem: Acute Rehab PT Goals(only PT should resolve) Goal: Pt Will Go Supine/Side To Sit Outcome: Progressing Flowsheets (Taken 11/29/2019 1007) Pt will go Supine/Side to Sit: Independently Goal: Patient Will Transfer Sit To/From Stand Outcome: Progressing Flowsheets (Taken 11/29/2019 1007) Patient will transfer sit to/from stand: with min guard assist Goal: Pt Will Transfer Bed To Chair/Chair To Bed Outcome: Progressing Flowsheets (Taken 11/29/2019 1007) Pt will Transfer Bed to Chair/Chair to Bed: min guard assist Goal: Pt Will Ambulate Outcome: Progressing Flowsheets (Taken 11/29/2019 1007) Pt will Ambulate: . 50 feet . with min guard assist . with rolling walker   10:08 AM , 11/29/19 Karlyn Agee, SPT Physical Therapy with [facility] [phone] office   10:40 AM, 11/29/19 [name] PT, DPT Physical Therapist at [facility]

## 2019-11-29 NOTE — Evaluation (Addendum)
Physical Therapy Evaluation Patient Details Name: Jesus Rodriguez MRN: [id number] DOB: 1950/09/24 Today's Date: 11/29/2019   History of Present Illness  Jesus Rodriguez is an 69 y.o. male with PMH sig for atrial fibrillation, COPD, mild diastolic heard dysfunction, OSA, morbid obesity presents with weakness. History is per patient who is somewhat somnolent during history, but he does wake up to provide a coherent history at times.  Patient states he has had increasing weakness and difficulty getting about secondary to SOB/DOE. Notes he used to be able to go out to eat but over past several weeks, he has had difficulty even walking around his house. Notes his last full meal was last week when his neighbor brought him food. He states he has had difficulty breathing for 2 years but has gotten progressively worse over several months and much worse over 2-4 weeks. Admits he has had a cough productive of cream phlegm. Not sure if he has had fevers or wheezes. States he has been takng his medications regularly. Admits he has been sleeping a lot. Patient notes he stopped taking his Lasix last week "because Dr Harl Bowie told me to." But notes his SOB predates him stopping his Lasix.    Clinical Impression  The patient's O2 saturation was 84% (and on downward trend) on room air, 92% on 4L, and 88% on 4L during ambulation today. Patient demonstrates good sitting tolerance EOB and in chair at end of session. He also demonstrates good sitting balance EOB.The patient became SOB and fatigued upon standing after 4 minutes and required a break sitting in the chair prior to ambulation. The patient stated at home he typically walks no more than 10-15 feet at any one time. The patient was able to ambulate 30 feet with no complaints of SOB or LOB and performed stand to sit transfer with min guard. Patient tolerated sitting up in chair with call bell in arm's reach after therapy. PLAN: The patient will continue to benefit from  skilled physical therapy services in hospital at recommended venue below in order to improve balance, gait, and ADL's to promote independence in functional activities.      Follow Up Recommendations Home health PT;Supervision for mobility/OOB;Supervision - Intermittent    Equipment Recommendations  Other (comment) (SPC and walk-in tub)    Recommendations for Other Services   None recommended by PT.     Precautions / Restrictions Precautions Precautions: Fall;Other (comment) Precaution Comments: sacral ulcers Restrictions Weight Bearing Restrictions: No      Mobility  Bed Mobility Overal bed mobility: Modified Independent             General bed mobility comments: slow labored movement  Transfers Overall transfer level: Needs assistance Equipment used: Rolling walker (2 wheeled) Transfers: Sit to/from Stand Sit to Stand: Min guard;Min assist         General transfer comment: pt becomes SOB upon standing for several minutes  Ambulation/Gait Ambulation/Gait assistance: Min assist;Min guard Gait Distance (Feet): 30 Feet Assistive device: Rolling walker (2 wheeled) Gait Pattern/deviations: WFL(Within Functional Limits);Decreased stride length;Trunk flexed Gait velocity: slow labored movement with onset of tachypnea      Stairs            Wheelchair Mobility    Modified Rankin (Stroke Patients Only)       Balance Overall balance assessment: Needs assistance Sitting-balance support: No upper extremity supported;Feet supported Sitting balance-Leahy Scale: Good     Standing balance support: Bilateral upper extremity supported;During functional activity Standing balance-Leahy Scale: Good Standing  balance comment: SOB with prolonged standing                             Pertinent Vitals/Pain Pain Assessment: No/denies pain    Home Living Family/patient expects to be discharged to:: Private residence Living Arrangements: Alone Available  Help at Discharge: Family;Available PRN/intermittently Type of Home: Mobile home Home Access: Stairs to enter Entrance Stairs-Rails: Can reach both Entrance Stairs-Number of Steps: 5 Home Layout: One level Home Equipment: Walker - 4 wheels;Shower seat;Walker - standard Additional Comments: requests cane and walk in tub    Prior Function Level of Independence: Needs assistance   Gait / Transfers Assistance Needed: mod independent 10-15 feet within home  ADL's / Homemaking Assistance Needed: family prn but not enough help received currently  Comments: limited household ambulator     Hand Dominance   Dominant Hand: Right    Extremity/Trunk Assessment   Upper Extremity Assessment Upper Extremity Assessment: Overall WFL for tasks assessed    Lower Extremity Assessment Lower Extremity Assessment: Overall WFL for tasks assessed    Cervical / Trunk Assessment Cervical / Trunk Assessment: Normal  Communication   Communication: No difficulties  Cognition Arousal/Alertness: Awake/alert Behavior During Therapy: WFL for tasks assessed/performed Overall Cognitive Status: Within Functional Limits for tasks assessed                                        General Comments      Exercises     Assessment/Plan    PT Assessment Patient needs continued PT services  PT Problem List Decreased strength;Decreased range of motion;Decreased knowledge of use of DME;Obesity;Decreased activity tolerance;Decreased balance;Decreased mobility;Cardiopulmonary status limiting activity       PT Treatment Interventions DME instruction;Balance training;Gait training;Functional mobility training;Therapeutic activities;Therapeutic exercise;Patient/family education    PT Goals (Current goals can be found in the Care Plan section)  Acute Rehab PT Goals Patient Stated Goal: go home PT Goal Formulation: With patient Time For Goal Achievement: 12/06/19 Potential to Achieve Goals:  Good    Frequency Min 3X/week   Barriers to discharge        Co-evaluation               AM-PAC PT "6 Clicks" Mobility  Outcome Measure Help needed turning from your back to your side while in a flat bed without using bedrails?: None Help needed moving from lying on your back to sitting on the side of a flat bed without using bedrails?: A Little Help needed moving to and from a bed to a chair (including a wheelchair)?: A Little Help needed standing up from a chair using your arms (e.g., wheelchair or bedside chair)?: A Little Help needed to walk in hospital room?: A Little Help needed climbing 3-5 steps with a railing? : A Lot 6 Click Score: 18    End of Session Equipment Utilized During Treatment: Gait belt;Oxygen Activity Tolerance: Treatment limited secondary to medical complications (Comment);Patient tolerated treatment well (SOB, O2 de-sat symptoms, symptomatic a fib) Patient left: in chair;with call bell/phone within reach Nurse Communication: Mobility status PT Visit Diagnosis: Unsteadiness on feet (R26.81);Other abnormalities of gait and mobility (R26.89);Muscle weakness (generalized) (M62.81);Other (comment) (cardiopulm status)    Time: 1610-9604 PT Time Calculation (min) (ACUTE ONLY): 42 min   Charges:   PT Evaluation $PT Eval Moderate Complexity: 1 Mod PT Treatments $Therapeutic Activity:  23-37 mins        10:40 AM , 11/29/19 Karlyn Agee, SPT Physical Therapy with [facility] [phone] office      This qualified practitioner was present in the room guiding the student in service delivery. Therapy student was participating in the provision of services, and the practitioner was not engaged in treating another patient or doing other tasks at the same time. 10:40 AM, 11/29/19 [name] PT, DPT Physical Therapist at [facility]

## 2019-11-30 ENCOUNTER — Inpatient Hospital Stay (HOSPITAL_COMMUNITY): Payer: Medicare Other

## 2019-11-30 DIAGNOSIS — G4730 Sleep apnea, unspecified: Secondary | ICD-10-CM

## 2019-11-30 DIAGNOSIS — L89301 Pressure ulcer of unspecified buttock, stage 1: Secondary | ICD-10-CM

## 2019-11-30 DIAGNOSIS — J9601 Acute respiratory failure with hypoxia: Secondary | ICD-10-CM

## 2019-11-30 DIAGNOSIS — I5033 Acute on chronic diastolic (congestive) heart failure: Secondary | ICD-10-CM

## 2019-11-30 DIAGNOSIS — J441 Chronic obstructive pulmonary disease with (acute) exacerbation: Secondary | ICD-10-CM

## 2019-11-30 DIAGNOSIS — I4811 Longstanding persistent atrial fibrillation: Secondary | ICD-10-CM

## 2019-11-30 LAB — BASIC METABOLIC PANEL
Anion gap: 11 (ref 5–15)
BUN: 26 mg/dL — ABNORMAL HIGH (ref 8–23)
CO2: 40 mmol/L — ABNORMAL HIGH (ref 22–32)
Calcium: 8.6 mg/dL — ABNORMAL LOW (ref 8.9–10.3)
Chloride: 88 mmol/L — ABNORMAL LOW (ref 98–111)
Creatinine, Ser: 0.67 mg/dL (ref 0.61–1.24)
GFR calc Af Amer: >60 mL/min (ref >60)
GFR calc non Af Amer: >60 mL/min (ref >60)
Glucose, Bld: 171 mg/dL — ABNORMAL HIGH (ref 70–99)
Potassium: 3.3 mmol/L — ABNORMAL LOW (ref 3.5–5.1)
Sodium: 139 mmol/L (ref 135–145)

## 2019-11-30 LAB — CBC
HCT: 43.7 % (ref 39.0–52.0)
Hemoglobin: 13.6 g/dL (ref 13.0–17.0)
MCH: 30 pg (ref 26.0–34.0)
MCHC: 31.1 g/dL (ref 30.0–36.0)
MCV: 96.3 fL (ref 80.0–100.0)
Platelets: 205 10*3/uL (ref 150–400)
RBC: 4.54 MIL/uL (ref 4.22–5.81)
RDW: 14.2 % (ref 11.5–15.5)
WBC: 8 10*3/uL (ref 4.0–10.5)
nRBC: 0 % (ref 0.0–0.2)

## 2019-11-30 LAB — PROTIME-INR
INR: 3.2 — ABNORMAL HIGH (ref 0.8–1.2)
Prothrombin Time: 31.7 seconds — ABNORMAL HIGH (ref 11.4–15.2)

## 2019-11-30 LAB — MAGNESIUM: Magnesium: 2.5 mg/dL — ABNORMAL HIGH (ref 1.7–2.4)

## 2019-11-30 MED ORDER — PREDNISONE 20 MG PO TABS: ORAL_TABLET | ORAL | 0 refills | Status: DC

## 2019-11-30 MED ORDER — AMOXICILLIN-POT CLAVULANATE 875-125 MG PO TABS: 1.0000 | ORAL_TABLET | Freq: Two times a day (BID) | ORAL | 0 refills | Status: AC

## 2019-11-30 MED ORDER — METOPROLOL TARTRATE 50 MG PO TABS: 50.0000 mg | ORAL_TABLET | Freq: Two times a day (BID) | ORAL | 1 refills | Status: DC

## 2019-11-30 NOTE — Progress Notes (Signed)
Oxygen tank for discharge arrived. IV access removed. Gave paperwork and helped get dressed. Called his son who will pick him up.

## 2019-11-30 NOTE — Progress Notes (Signed)
ANTICOAGULATION CONSULT NOTE -   Pharmacy Consult for warfarin dosing Indication: atrial fibrillation  Allergies  Allergen Reactions  . Celebrex [Celecoxib]   . Methotrexate Derivatives Other (See Comments)    Arrhythmia   . Sulfa Antibiotics     Patient Measurements: Height: 6\' 1"  (185.4 cm) Weight: (!) 174.1 kg (383 lb 13.1 oz) IBW/kg (Calculated) : 79.9 HEPARIN DW (KG): 121.6  Vital Signs: Temp: 98 F (36.7 C) (08/18 0400) Temp Source: Oral (08/18 0400) BP: 122/94 (08/18 0800) Pulse Rate: 93 (08/18 0800)  Labs: Recent Labs    11/27/19 1006 11/27/19 1208 11/27/19 2122 11/27/19 2122 11/27/19 2317 11/28/19 0437 11/28/19 0437 11/29/19 0504 11/29/19 1004 11/30/19 0412  HGB   < >  --  13.9   < >  --  14.4  --   --   --  13.6  HCT   < >  --  45.1  --   --  47.1  --   --   --  43.7  PLT   < >  --  199  --   --  200  --   --   --  205  LABPROT  --   --  29.9*   < >  --  27.4*  --  27.0*  --  31.7*  INR  --   --  3.0*   < >  --  2.7*  --  2.6*  --  3.2*  CREATININE   < >  --  0.76   < >  --  1.06   < > 0.77 0.87 0.67  TROPONINIHS  --  63* 45*  --  39*  --   --   --   --   --    < > = values in this interval not displayed.    Estimated Creatinine Clearance: 145 mL/min (by C-G formula based on SCr of 0.67 mg/dL).     Assessment: Pharmacy consulted to dose warfarin  for this 69 yo male on chronic anti-coagulation with warfarin for atrial fibrillation. Baseline CBC is WNL. INR this AM 3.2 is supratherapeutic  Home dose: warfarin 5mg  on Sun/Tues/Thurs                       warfarin 10mg  on Mon/Wed/Fri/Sat  Goal of Therapy:  INR 2-3 Monitor platelets by anticoagulation protocol: Yes   Plan:  No coumadin today Daily PT/INR and every other day CBC Monitor patient for signs and symptoms of bleeding.  Isac Sarna, BS Vena [place], BCPS Clinical Pharmacist Pager [phone] 11/30/2019,8:53 AM

## 2019-11-30 NOTE — Progress Notes (Signed)
SATURATION QUALIFICATIONS: (This note is used to comply with regulatory documentation for home oxygen)  Patient Saturations on Room Air at Rest = 87  Patient Saturations on Room Air while Ambulating = 85  Patient Saturations on 4 Liters of oxygen while Ambulating = 95%  Please briefly explain why patient needs home oxygen:

## 2019-11-30 NOTE — Care Management Important Message (Signed)
Important Message  Patient Details  Name: Jesus Rodriguez MRN: [id number] Date of Birth: [birth year]   Medicare Important Message Given:  Yes     Tommy Medal 11/30/2019, 4:15 PM

## 2019-11-30 NOTE — Progress Notes (Signed)
Progress Note  Patient Name: Jesus Rodriguez Date of Encounter: 11/30/2019  Primary Cardiologist: Carlyle Dolly, MD  Subjective   Breathing has improved.  No chest pain or sense of palpitations.  Inpatient Medications    Scheduled Meds: . amoxicillin-clavulanate  1 tablet Oral Q12H  . benazepril  20 mg Oral Daily  . Chlorhexidine Gluconate Cloth  6 each Topical Daily  . furosemide  40 mg Intravenous Daily  . ipratropium-albuterol  3 mL Nebulization TID  . methylPREDNISolone (SOLU-MEDROL) injection  60 mg Intravenous Q6H  . metoprolol tartrate  50 mg Oral BID  . sodium chloride flush  3 mL Intravenous Q12H  . sodium chloride flush  3 mL Intravenous Q12H  . tamsulosin  0.4 mg Oral Daily  . Warfarin - Pharmacist Dosing Inpatient   Does not apply q1600   Continuous Infusions: . sodium chloride    . sodium chloride     PRN Meds: sodium chloride, sodium chloride, acetaminophen **OR** acetaminophen, polyethylene glycol, sodium chloride flush, sodium chloride flush, sorbitol   Vital Signs    Vitals:   11/30/19 0000 11/30/19 0100 11/30/19 0400 11/30/19 0500  BP: 127/63 126/72    Pulse: 65 77    Resp:      Temp: 98.7 F (37.1 C)  98 F (36.7 C)   TempSrc: Oral  Oral   SpO2: 90% 93%    Weight:    (!) 174.1 kg  Height:        Intake/Output Summary (Last 24 hours) at 11/30/2019 0831 Last data filed at 11/29/2019 1811 Gross per 24 hour  Intake --  Output 2050 ml  Net -2050 ml   Filed Weights   11/27/19 2100 11/29/19 0500 11/30/19 0500  Weight: (!) 172.3 kg (!) 173 kg (!) 174.1 kg    Telemetry    Atrial fibrillation.  Personally reviewed.  ECG    An ECG dated 11/29/2019 was personally reviewed today and demonstrated:  Rate controlled atrial fibrillation, QTc normal.  Physical Exam   GEN:  Morbidly obese male, no acute distress.   Neck: No JVD. Cardiac:  Irregularly irregular without gallop.  Respiratory:  Decreased breath sounds, no wheezing. GI: Soft,  nontender, bowel sounds present. MS:  Improved lower leg edema; No deformity.  Labs    Chemistry Recent Labs  Lab 11/27/19 1006 11/27/19 1006 11/27/19 2122 11/28/19 0437 11/29/19 0504 11/29/19 1004 11/30/19 0412  NA 138   < > 137   < > 138 137 139  K 2.8*   < > 3.2*   < > 3.5 3.4* 3.3*  CL 85*   < > 87*   < > 89* 88* 88*  CO2 41*   < > 37*   < > 37* 37* 40*  GLUCOSE 116*   < > 151*   < > 174* 279* 171*  BUN 16   < > 15   < > 31* 30* 26*  CREATININE 0.77   < > 0.76   < > 0.77 0.87 0.67  CALCIUM 8.5*   < > 8.1*   < > 8.8* 8.6* 8.6*  PROT 7.0  --  6.9  --   --   --   --   ALBUMIN 3.2*  --  3.1*  --   --   --   --   AST 14*  --  15  --   --   --   --   ALT 10  --  10  --   --   --   --  ALKPHOS 65  --  65  --   --   --   --   BILITOT 1.0  --  1.1  --   --   --   --   GFRNONAA >60   < > >60   < > >60 >60 >60  GFRAA >60   < > >60   < > >60 >60 >60  ANIONGAP 12   < > 13   < > 12 12 11    < > = values in this interval not displayed.     Hematology Recent Labs  Lab 11/27/19 2122 11/28/19 0437 11/30/19 0412  WBC 10.1 8.8 8.0  RBC 4.65 4.81 4.54  HGB 13.9 14.4 13.6  HCT 45.1 47.1 43.7  MCV 97.0 97.9 96.3  MCH 29.9 29.9 30.0  MCHC 30.8 30.6 31.1  RDW 14.8 14.3 14.2  PLT 199 200 205    Cardiac Enzymes Recent Labs  Lab 11/27/19 1006 11/27/19 1208 11/27/19 2122 11/27/19 2317  TROPONINIHS 63* 63* 45* 39*    BNP Recent Labs  Lab 11/27/19 1006 11/27/19 2122  BNP 296.0* 323.0*     Radiology    No results found.  Cardiac Studies   Echocardiogram 11/04/2018: 1. Images are limited.  2. The left ventricle has grossly normal systolic function, with an  ejection fraction of 55-60%. The cavity size was normal. There is mildly  increased left ventricular wall thickness. Left ventricular diastolic  Doppler parameters are indeterminate.  3. The right ventricle has normal systolic function. The cavity was  normal. There is no increase in right ventricular wall  thickness. Right  ventricular systolic pressure is normal with an estimated pressure of 12.5  mmHg.  4. The aortic valve is tricuspid. Mild calcification of the aortic valve.  Mild aortic annular calcification noted.  5. The mitral valve is grossly normal. There is mild mitral annular  calcification present.  6. The tricuspid valve is grossly normal.  7. The aorta is abnormal in size and structure.  8. There is mild dilatation of the aortic root.  9. The interatrial septum was not well visualized.   Patient Profile     69 y.o. male with a history of atrial flutter status post ablation, persistent atrial fibrillation, rheumatoid arthritis, OSA, hypertension, hyperlipidemia, and obesity.  Assessment & Plan    1.  Persistent atrial fibrillation as well as previous history of typical atrial flutter ablation (2017).  CHA2DS2-VASc score is 3.  We discussed management options, I think the best course of action would be rate control strategy at this time given low likelihood of maintaining sinus rhythm long-term.  He has also had electrolyte abnormalities and recent prolonged QTc on Tikosyn.  2.  Acute hypoxic respiratory failure, possibly chronic COPD with lung disease suspected in the setting of longstanding tobacco abuse.  Clinically improved.  3.  OSA on CPAP.  After discussion today, plan is to stop Tikosyn going forward.  Continue strategy of heart rate control and anticoagulation.  Beta-blocker has been uptitrated, continue Lopressor at 50 mg twice daily along with Coumadin for stroke prophylaxis.  Discontinue IV Lasix, can resume outpatient dose of Demadex at discharge, needs a potassium supplement as well.  We will schedule follow-up in the [facility] office to ensure adequate symptom control with heart rate control strategy in atrial fibrillation.  Reasonable for discharge from a cardiac perspective.  [facility] will sign off.    Signed, Rozann Lesches, MD  11/30/2019, 8:31 AM

## 2019-11-30 NOTE — TOC Transition Note (Signed)
Transition of Care [facility]) - CM/SW Discharge Note   Patient Details  Name: Jesus Rodriguez MRN: [id number] Date of Birth: 08/22/1950  Transition of Care [facility]) CM/SW Contact:  Shade Flood, LCSW Phone Number: 11/30/2019, 12:38 PM   Clinical Narrative:     Pt stable for dc home today per MD. Pt needs continuous Home O2 now. Spoke with pt who states that he has CPAP and a concentrator at home for use at night from [place].  Spoke with [employer] rep by phone and faxed clinical documentation and orders to [facility] with request for portable tank for dc. Plan remains for [facility] RN and PT from [place]. Updated Georgina Snell at [place] of pt's dc.  There was an order entered for [facility] consult for initiation of Tikosyn. Cardiology notes from today indicate that plan is now to discontinue the Tikosyn. Will clear this consult.  There are no other TOC needs identified for dc.  Expected Discharge Plan: [place] Barriers to Discharge: Barriers Resolved   Patient Goals and CMS Choice Patient states their goals for this hospitalization and ongoing recovery are:: go home CMS Medicare.gov Compare Post Acute Care list provided to:: Patient Choice offered to / list presented to : Patient  Expected Discharge Plan and Services Expected Discharge Plan: [place] In-house Referral: Clinical Social Work   Post Acute Care Choice: Museum/gallery conservator, Home Health                   DME Arranged: Oxygen DME Agency: Other - Comment Personal assistant) Date DME Agency Contacted: 11/30/19     HH Arranged: RN, PT [place] Agency: [place] Date [facility] Agency Contacted: 11/29/19 Time HH Agency Contacted: 1034 Representative spoke with at [place]: Georgina Snell  Prior Living Arrangements/Services   Lives with:: Self Patient language and need for interpreter reviewed:: Yes Do you feel safe going back to the place where you live?: Yes      Need for Family Participation  in Patient Care: No (Comment) Care giver support system in place?: No (comment)   Criminal Activity/Legal Involvement Pertinent to Current Situation/Hospitalization: No - Comment as needed  Activities of Daily Living Home Assistive Devices/Equipment: None ADL Screening (condition at time of admission) Patient's cognitive ability adequate to safely complete daily activities?: Yes Is the patient deaf or have difficulty hearing?: No Does the patient have difficulty seeing, even when wearing glasses/contacts?: No Does the patient have difficulty concentrating, remembering, or making decisions?: No Patient able to express need for assistance with ADLs?: Yes Does the patient have difficulty dressing or bathing?: No Independently performs ADLs?: Yes (appropriate for developmental age) Does the patient have difficulty walking or climbing stairs?: No Weakness of Legs: None Weakness of Arms/Hands: None  Permission Sought/Granted Permission sought to share information with : Investment banker, corporate granted to share info w AGENCY: Librarian, academic        Emotional Assessment Appearance:: Appears younger than stated age Attitude/Demeanor/Rapport: Engaged Affect (typically observed): Pleasant Orientation: : Oriented to Self, Oriented to Place, Oriented to  Time, Oriented to Situation Alcohol / Substance Use: Not Applicable Psych Involvement: No (comment)  Admission diagnosis:  Acute respiratory distress [R06.03] Hypokalemia [E87.6] Acute respiratory failure with hypoxia (HCC) [J96.01] Congestive heart failure, unspecified HF chronicity, unspecified heart failure type ([place]) [I50.9] Pressure injury of buttock, stage 2, unspecified laterality ([place]) [L89.302] Patient Active Problem List   Diagnosis Date Noted  . Pressure injury  of skin 11/28/2019  . Acute respiratory failure with hypoxia ([place]) 11/27/2019  . Atrial fibrillation ([place]) 05/11/2018  . Encounter  for therapeutic drug monitoring 04/22/2018  . Persistent atrial fibrillation ([place]) 04/08/2018  . History of rheumatoid arthritis seronegative 09/10/2017  . High risk medications (not anticoagulants) long-term use 02/14/2016  . Class 3 obesity in adult 02/14/2016  . Depression 02/14/2016  . Osteoarthritis of both hands 02/13/2016  . Osteoarthritis of both knees 02/13/2016  . DDD (degenerative disc disease), lumbar 02/13/2016  . 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 Hypertension 02/13/2016  . Elevated cholesterol 02/13/2016  . Insomnia 02/13/2016  . Anxiety 02/13/2016  . Sleep apnea 02/13/2016  . Arrhythmia 02/13/2016   PCP:  Patient, [name] Pharmacy:   [id number] [place], [place] [address] [place] [address] Phone: [phone] Fax: [phone]  [facility] [place], [place] [address] [place] [address] Phone: [phone] Fax: [phone]     Social Determinants of Health (SDOH) Interventions    Readmission Risk Interventions Readmission Risk Prevention Plan 11/30/2019  Transportation Screening Complete  Home Care Screening Complete  Medication Review (RN CM) Complete  Some recent data might be hidden     Final next level of care: Home w Home Health Services Barriers to Discharge: Barriers Resolved   Patient Goals and CMS Choice Patient states their goals for this hospitalization and ongoing recovery are:: go home CMS Medicare.gov Compare Post Acute Care list provided to:: Patient Choice offered to / list presented to : Patient  Discharge Placement                Patient to be transferred to facility by: N/A Name of family member notified: N/A    Discharge Plan and Services In-house Referral: Clinical Social Work   Post Acute Care Choice: Museum/gallery conservator, Home Health          DME Arranged: Oxygen DME Agency: Other - Comment Personal assistant) Date DME  Agency Contacted: 11/30/19     HH Arranged: RN, PT [place] Agency: [place] Date [facility] Agency Contacted: 11/29/19 Time [place]: 1034 Representative spoke with at [place]: [place] ([place]) Interventions     Readmission Risk Interventions Readmission Risk Prevention Plan 11/30/2019  Transportation Screening Complete  Home Care Screening Complete  Medication Review (RN CM) Complete  Some recent data might be hidden

## 2019-11-30 NOTE — Accreditation Note (Signed)
Patients oxygen dropped to 87% on room air lying in bed

## 2019-11-30 NOTE — Discharge Summary (Signed)
Physician Discharge Summary  Jesus Rodriguez MEQ:[id number] DOB: Oct 30, 1950 DOA: 11/27/2019  PCP: Patient, [name]  Admit date: 11/27/2019 Discharge date: 11/30/2019  Time spent: 35 minutes  Recommendations for Outpatient Follow-up:  1. Reassess blood pressure and adjust antihypertensive regimen as needed 2. Repeat basic metabolic panel to follow electrolytes and renal function 3. Continue assisting patient with weight loss management.   Discharge Diagnoses:  Principal Problem:   Acute respiratory failure with hypoxia (HCC) Active Problems:   Hypertension   Sleep apnea   Atrial fibrillation (HCC)   Pressure injury of skin   Obesity, Class III, BMI 40-49.9 (morbid obesity) (HCC)   COPD with acute exacerbation (HCC)   Acute on chronic diastolic HF (heart failure) ([place])   Discharge Condition: Stable and improved.  Discharged home with instruction to follow-up with PCP and cardiology as an outpatient.  CODE STATUS: Full code.  Diet recommendation: Low calorie diet; heart healthy diet.  Filed Weights   11/27/19 2100 11/29/19 0500 11/30/19 0500  Weight: (!) 172.3 kg (!) 173 kg (!) 174.1 kg    History of present illness:  As per H&P written by Dr. Jamse Arn on 11/27/2019 Jesus Rodriguez is an 69 y.o. male with PMH sig for atrial fibrillation, COPD, mild diastolic heard dysfunction, OSA, morbid obesity presents with weakness. History is per patient who is somewhat somnolent during history, but he does wake up to provide a coherent history at times.   Patient states he has had increasing weakness and difficulty getting about secondary to SOB/DOE. Notes he used to be able to go out to eat but over past several weeks, he has had difficulty even walking around his house. Notes his last full meal was last week when his neighbor brought him food. He states he has had difficulty breathing for 2 years but has gotten progressively worse over several months and much worse over 2-4 weeks.  Admits he has had a cough productive of cream phlegm. Not sure if he has had fevers or wheezes. States he has been takng his medications regularly. Admits he has been sleeping a lot. Patient notes he stopped taking his Lasix last week "because Dr Harl Bowie told me to." But notes his SOB predates him stopping his Lasix.   ED Course:  The patient was noted to be hypoxic with o2 sats 88% on RA which corrected to 92% on 3-4 liters. He was thought to be in CHF and was given IV Lasix and called in for admission. Of note, BNP is minimally elevated and CXR shows minimal pulmonary vascular congestion.   Hospital Course:  1-acute on chronic respiratory failure with hypoxia: In the setting of acute on chronic diastolic CHF exacerbation, COPD exacerbation and persistent atrial fibrillation. -Patient rate has been controlled and following cardiology recommendations no further Tikosyn will be provided at time of discharge. -Patient will follow up with electrophysiologist, continue adjusted dose of metoprolol 50 mg twice a day and using Coumadin for secondary prevention. -His volume was a stabilized with IV diuresis and at time of discharge no frank crackles were appreciated.  Patient will continue the use of torsemide 40 mg twice a day, checking his weight on daily basis, maintaining adequate hydration and with instructions to pursuit low-sodium diet.  Patient will follow up with cardiology service as an outpatient. -For his COPD exacerbation and bronchiectasis, patient will complete steroid tapering and oral antibiotics.  Resume home bronchodilator regimen and adjusted dose of oxygen supplementation.  2-morbid obesity/obstructive sleep apnea -Low calorie diet, portion  control and increase physical activity discussed with patient -Body mass index is 50.64 kg/m. -Continue the use of CPAP nightly.  3-essential hypertension -Stable overall -Continue heart healthy diet and current antihypertensive  regimen.  4-physical deconditioning -Physical therapy has evaluated patient and recommended home health physical therapy; orders in place to provide rehab and conditioning at home after discharge.  5-pressure injury present on admission: Stage I affecting bilateral buttocks; no signs of superimposed infection seen. -Continue preventive measures and skin care as recommended by wound care services.  6-BPH -Continue Flomax -No signs of urinary retention symptoms.  7-hyperlipidemia -Continue statins.  8-increased risk for hypercoagulable state driven by atrial fibrillation -Continue the use of Coumadin -Follow INR and further adjust dose as needed as an outpatient.  Procedures:  See below for x-ray reports.  Consultations:  Cardiology service  Discharge Exam: Vitals:   11/30/19 1000 11/30/19 1100  BP: (!) 104/58 127/61  Pulse: 80 79  Resp:    Temp:    SpO2: 93% 92%    General: Morbidly obese, denying chest pain or palpitations at this time.  Speaking in full sentences with good oxygen saturation on 4 L nasal cannula supplementation.  Patient feeling ready to go home. Cardiovascular: Rate controlled, no rubs, no gallops, unable to assess JVD with body habitus. Respiratory: No frank crackles, positive scattered rhonchi; no wheezing currently.  No using accessory muscles. Abdomen: Obese, soft, nontender, distended, positive bowel sounds Extremities: No cyanosis, no clubbing; chronic stasis dermatitis changes appreciated bilaterally.  No signs of superimposed infection. Skin: Stage I pressure injury appreciated on his buttocks left and right without signs of superimposed infection.  Pressure injuries were present at time of admission.  Discharge Instructions   Discharge Instructions    (HEART FAILURE PATIENTS) Call MD:  Anytime you have any of the following symptoms: 1) 3 pound weight gain in 24 hours or 5 pounds in 1 week 2) shortness of breath, with or without a dry hacking  cough 3) swelling in the hands, feet or stomach 4) if you have to sleep on extra pillows at night in order to breathe.   Complete by: As directed    Diet - low sodium heart healthy   Complete by: As directed    Discharge instructions   Complete by: As directed    Take medications as prescribed Maintain adequate hydration Follow low-sodium diet (less than 2 g of sodium daily) and low calorie diet. Check your weight on daily basis and contact PCP/cardiology office in case of more than 3 pounds weight gain overnight and/or more than 5 pounds in a week.   Discharge wound care:   Complete by: As directed    Clean the skin properly and pat dry; apply a thin layer of moisture barrier cream to affected area (left buttocks and right buttocks; at least twice a day and as needed (in case of soiling area) to prevent further skin breakdown.  Patient also instructed to change position frequently and maintain good overall hygiene.   Increase activity slowly   Complete by: As directed      Allergies as of 11/30/2019      Reactions   Celebrex [celecoxib]    Methotrexate Derivatives Other (See Comments)   Arrhythmia    Sulfa Antibiotics       Medication List    STOP taking these medications   dofetilide 500 MCG capsule Commonly known as: TIKOSYN   sildenafil 100 MG tablet Commonly known as: Viagra     TAKE  these medications   acetaminophen 500 MG tablet Commonly known as: TYLENOL Take by mouth as needed.   albuterol 108 (90 Base) MCG/ACT inhaler Commonly known as: VENTOLIN HFA Inhale 1 puff into the lungs every 6 (six) hours as needed.   amoxicillin-clavulanate 875-125 MG tablet Commonly known as: AUGMENTIN Take 1 tablet by mouth every 12 (twelve) hours for 3 days.   benazepril 20 MG tablet Commonly known as: LOTENSIN Take 1 tablet (20 mg total) by mouth daily.   HYDROcodone-acetaminophen 5-325 MG tablet Commonly known as: NORCO/VICODIN Take 1 tablet by mouth 2 (two) times daily as  needed for moderate pain.   Incruse Ellipta 62.5 MCG/INH Aepb Generic drug: umeclidinium bromide Inhale 1 puff into the lungs daily.   metoprolol tartrate 50 MG tablet Commonly known as: LOPRESSOR Take 1 tablet (50 mg total) by mouth 2 (two) times daily. What changed:   medication strength  how much to take   predniSONE 20 MG tablet Commonly known as: Deltasone Take 3 tablets by mouth daily x1 day; then 2 tablet by mouth daily x2 days; then 1 tablet by mouth daily x2 days; then 1/2 tablet by mouth daily x3 days and stop prednisone.   simvastatin 20 MG tablet Commonly known as: ZOCOR TAKE 1 TABLET (20 MG TOTAL) BY MOUTH DAILY AT 6 PM   tamsulosin 0.4 MG Caps capsule Commonly known as: FLOMAX Take by mouth daily.   torsemide 20 MG tablet Commonly known as: DEMADEX TAKE 2 TABLETS (40 MG TOTAL) BY MOUTH 2 (TWO) TIMES DAILY. What changed: See the new instructions.   warfarin 10 MG tablet Commonly known as: COUMADIN Take as directed. If you are unsure how to take this medication, talk to your nurse or doctor. Original instructions: TAKE 1/2 TO 1 TABLET DAILY AS DIRECTED BY COUMADIN CLINIC.            Durable Medical Equipment  (From admission, onward)         Start     Ordered   11/30/19 1218  For home use only DME oxygen  Once       Comments: Titrate at night with CPAP bleed in 15 cm H2O  Question Answer Comment  Length of Need 12 Months   Mode or (Route) Nasal cannula   Liters per Minute 4   Frequency Continuous (stationary and portable oxygen unit needed)   Oxygen conserving device Yes   Oxygen delivery system Gas      11/30/19 1219           Discharge Care Instructions  (From admission, onward)         Start     Ordered   11/30/19 0000  Discharge wound care:       Comments: Clean the skin properly and pat dry; apply a thin layer of moisture barrier cream to affected area (left buttocks and right buttocks; at least twice a day and as needed (in case  of soiling area) to prevent further skin breakdown.  Patient also instructed to change position frequently and maintain good overall hygiene.   11/30/19 1301         Allergies  Allergen Reactions  . Celebrex [Celecoxib]   . Methotrexate Derivatives Other (See Comments)    Arrhythmia   . Sulfa Antibiotics     Follow-up Information    [facility] Follow up.   Specialty: Home Health Services Why: HHPT and aid Contact information: [place] [address] [phone]  Thompson Grayer, MD Follow up on 12/16/2019.   Specialty: Cardiology Why: Keep scheduled Cardiology Hospital Follow-up on 12/16/2019 at 9:00 AM.  Contact information: [place] [address] [phone]        Arnoldo Lenis, MD. Schedule an appointment as soon as possible for a visit in 10 day(s).   Specialty: Cardiology Contact information: [address] [place] [address] [phone]               The results of significant diagnostics from this hospitalization (including imaging, microbiology, ancillary and laboratory) are listed below for reference.    Significant Diagnostic Studies: CT Angio Chest PE W and/or Wo Contrast  Result Date: 11/27/2019 CLINICAL DATA:  Chest pain or SOB, pleurisy or effusion suspected EXAM: CT ANGIOGRAPHY CHEST WITH CONTRAST TECHNIQUE: Multidetector CT imaging of the chest was performed using the standard protocol during bolus administration of intravenous contrast. Multiplanar CT image reconstructions and MIPs were obtained to evaluate the vascular anatomy. CONTRAST:  135mL OMNIPAQUE IOHEXOL 350 MG/ML SOLN COMPARISON:  Radiograph earlier this day. FINDINGS: Cardiovascular: Contrast bolus timing is suboptimal for assessment of pulmonary embolus. Evaluation is diagnostic to the lobar level. There are no central pulmonary arterial filling defects. The segmental and subsegmental branches cannot be assessed  due to contrast bolus timing and soft tissue attenuation from habitus. Dilatation of the main pulmonary artery at 4.2 cm. Aortic atherosclerosis. Aortic tortuosity. No aortic aneurysm. Mild multi chamber cardiomegaly. There are coronary artery calcifications. No pericardial effusion. Mediastinum/Nodes: Generalized mediastinal lipomatosis. Scattered small mediastinal lymph nodes not enlarged by size criteria. No hilar adenopathy. No esophageal thickening. No visualized thyroid nodule. Lungs/Pleura: Elevation of left hemidiaphragm with adjacent compressive atelectasis at the left lower lobe and lingula. Dependent opacity in the right lower lobe may be atelectasis or pneumonia. No significant pleural fluid. Vascular congestion with minor septal thickening/pulmonary edema. No evidence of pulmonary mass. Upper Abdomen: Enlarged liver partially included. 2.5 cm cyst in the left lobe. Questionable gallbladder wall thickening versus motion artifact. Musculoskeletal: Diffuse multilevel degenerative change in the spine. There are no acute or suspicious osseous abnormalities. Review of the MIP images confirms the above findings. IMPRESSION: 1. Suboptimal contrast bolus timing for assessment of pulmonary embolus. No central pulmonary arterial filling defects to the lobar level. The segmental and subsegmental branches cannot be assessed due to contrast bolus timing and soft tissue attenuation from habitus. 2. Dilatation of the main pulmonary artery suggesting pulmonary arterial hypertension. 3. Elevation of left hemidiaphragm with adjacent compressive atelectasis at the left lower lobe and lingula. Dependent opacity in the right lower lobe may be atelectasis or pneumonia. 4. Cardiomegaly with minor pulmonary edema. 5. Questionable gallbladder wall thickening versus motion artifact. 6. Coronary artery calcifications. Aortic Atherosclerosis (ICD10-I70.0). Electronically Signed   By: Keith Rake M.D.   On: 11/27/2019 19:08    DG Chest Port 1 View  Result Date: 11/27/2019 CLINICAL DATA:  Short of breath on exertion. History of atrial fibrillation. EXAM: PORTABLE CHEST 1 VIEW COMPARISON:  None. FINDINGS: Mildly degraded exam due to AP portable technique and patient body habitus. Midline trachea. Marked cardiomegaly. Small left and probable small right pleural effusions. No pneumothorax. Pulmonary interstitial thickening, accentuated by low lung volumes. Bibasilar airspace disease. IMPRESSION: Cardiomegaly and mild pulmonary venous congestion. Low lung volumes with small bilateral pleural effusions and bibasilar airspace disease, most likely atelectasis. Limitations secondary to patient's size as detailed above. Electronically Signed   By: Adria Devon.D.  On: 11/27/2019 10:47    Microbiology: Recent Results (from the past 240 hour(s))  SARS Coronavirus 2 by RT PCR (hospital order, performed in [facility] hospital lab) Nasopharyngeal Nasopharyngeal Swab     Status: None   Collection Time: 11/27/19 11:21 AM   Specimen: Nasopharyngeal Swab  Result Value Ref Range Status   SARS Coronavirus 2 NEGATIVE NEGATIVE Final    Comment: (NOTE) SARS-CoV-2 target nucleic acids are NOT DETECTED.  The SARS-CoV-2 RNA is generally detectable in upper and lower respiratory specimens during the acute phase of infection. The lowest concentration of SARS-CoV-2 viral copies this assay can detect is 250 copies / mL. A negative result does not preclude SARS-CoV-2 infection and should not be used as the sole basis for treatment or other patient management decisions.  A negative result may occur with improper specimen collection / handling, submission of specimen other than nasopharyngeal swab, presence of viral mutation(s) within the areas targeted by this assay, and inadequate number of viral copies (<250 copies / mL). A negative result must be combined with clinical observations, patient history, and epidemiological  information.  Fact Sheet for Patients:   StrictlyIdeas.no  Fact Sheet for Healthcare Providers: BankingDealers.co.za  This test is not yet approved or  cleared by the [place] FDA and has been authorized for detection and/or diagnosis of SARS-CoV-2 by FDA under an Emergency Use Authorization (EUA).  This EUA will remain in effect (meaning this test can be used) for the duration of the COVID-19 declaration under Section 564(b)(1) of the Act, 21 U.S.C. section 360bbb-3(b)(1), unless the authorization is terminated or revoked sooner.  Performed at [facility], [address], [place], [place] [address]   MRSA PCR Screening     Status: None   Collection Time: 11/27/19  8:20 PM   Specimen: Nasal Mucosa; Nasopharyngeal  Result Value Ref Range Status   MRSA by PCR NEGATIVE NEGATIVE Final    Comment:        The GeneXpert MRSA Assay (FDA approved for NASAL specimens only), is one component of a comprehensive MRSA colonization surveillance program. It is not intended to diagnose MRSA infection nor to guide or monitor treatment for MRSA infections. Performed at [facility], [address] [place], [place] [address]      Labs: Basic Metabolic Panel: Recent Labs  Lab 11/27/19 2122 11/28/19 0437 11/29/19 0504 11/29/19 1004 11/30/19 0412  NA 137 138 138 137 139  K 3.2* 3.2* 3.5 3.4* 3.3*  CL 87* 88* 89* 88* 88*  CO2 37* 36* 37* 37* 40*  GLUCOSE 151* 141* 174* 279* 171*  BUN 15 19 31* 30* 26*  CREATININE 0.76 1.06 0.77 0.87 0.67  CALCIUM 8.1* 8.4* 8.8* 8.6* 8.6*  MG 2.2  --   --  2.2 2.5*   Liver Function Tests: Recent Labs  Lab 11/27/19 1006 11/27/19 2122  AST 14* 15  ALT 10 10  ALKPHOS 65 65  BILITOT 1.0 1.1  PROT 7.0 6.9  ALBUMIN 3.2* 3.1*   CBC: Recent Labs  Lab 11/27/19 1006 11/27/19 2122 11/28/19 0437 11/30/19 0412  WBC 8.8 10.1 8.8 8.0  NEUTROABS 6.9 9.5*  --   --   HGB 14.2 13.9 14.4 13.6  HCT  45.5 45.1 47.1 43.7  MCV 96.6 97.0 97.9 96.3  PLT 194 199 200 205   BNP (last 3 results) Recent Labs    11/27/19 1006 11/27/19 2122  BNP 296.0* 323.0*    Signed:  Barton Dubois MD.  [facility] 11/30/2019,  1:04 PM

## 2019-12-05 ENCOUNTER — Telehealth: Payer: Self-pay | Admitting: Cardiology

## 2019-12-05 NOTE — Telephone Encounter (Signed)
Will send to Dr. Harl Bowie for answer.

## 2019-12-05 NOTE — Progress Notes (Signed)
Cardiology Office Note  Date: 12/06/2019   ID: Jesus Rodriguez, DOB 1950/07/16, MRN [id number]  PCP:  Patient, [name]  Cardiologist:  Carlyle Dolly, MD Electrophysiologist:  Thompson Grayer, MD   Chief Complaint: Follow-up atrial fibrillation/atrial flutter, chronic diastolic heart failure, COPD, OSA  History of Present Illness: Jesus Rodriguez is a 69 y.o. male with a history of atrial fibrillation/flutter, chronic diastolic heart failure, COPD, OSA on CPAP.  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  Previously followed by [facility].  Failed sotalol, started on dofetilide, followed by Dr. Rayann Heman at EP some recent palpitations, heart rates in the 60s and 70s.  Chronic diastolic heart failure.  11/01/2018 echo showed LVEF of 55 to 60%.  Phone call end of April concerning increased edema and shortness of breath.  Torsemide was increased to 60 mg p.o. twice daily.    Last saw Dr. Harl Bowie 11/22/2019 via telemedicine visit. He had some recent palpitations and heart rates were in the 60s and 70s.  Office weight on 09/06/2019 was 396 pounds.  Home weight was 388-393 pounds. Being followed by pulmonary in [place] for COPD and OSA.  Recent presentation to the emergency room 11/27/2019 with complaints of weakness and progressively worsening shortness of breath. Increasing difficulty breathing even walking around his house.  He admitted to having difficulty breathing for 2 years but had gotten progressively worse over the previous months much worse over the last 2 to 4 months.  He was admitted with acute on chronic respiratory failure with hypoxia in the setting of acute on chronic diastolic CHF exacerbation, COPD exacerbation and persistent atrial fibrillation.  He was hypokalemic with a potassium of 2.8.  BNP was 296.  Troponins were 63, 45, 39.  Patient was diuresed with IV diuretics and discharged on torsemide 40 mg p.o. twice daily.  Patient was given steroids and oral antibiotics for  COPD exacerbation and bronchiectasis.  He was to resume bronchodilator regimen and oxygen at home.  He was discharged on 11/30/2019   He is here for hospital follow-up.  He states he is feeling a little better but is having some issues with nervousness.  States he was started on prednisone in the hospital along with antibiotics for his respiratory infection and believes the prednisone is making him nervous..  He states his breathing is better but he has chronic dyspnea.  He is on 2 L nasal O2 continuously now.  He is using his CPAP at home.  Significant history of morbid obesity with likely hypoventilation syndrome.  States he has not had a bowel movement in approximately 8 to 9 days since being discharged from the hospital.  He states he has no PCP because the PCP has recently retired.  He denies any anginal symptoms, palpitations or arrhythmias, orthostatic symptoms, bleeding in stool or urine.  Claudication, DVT or PE-like symptoms, or lower extremity edema.  He does have orthopnea.  He sleeps on an incline while using his CPAP.  He has lost approximately 4 pounds since August 10.  Current weight today in clinic is 393 pounds.   Past Medical History:  Diagnosis Date  . Anxiety 02/13/2016  . Atrial flutter ([place]) 02/13/2016   s/p CTI ablation at [facility]  . DDD (degenerative disc disease), lumbar 02/13/2016  . Essential hypertension 02/13/2016  . Insomnia 02/13/2016  . Mixed hyperlipidemia 02/13/2016  . Osteoarthritis of both hands   . Persistent atrial fibrillation ([place])   . RA (rheumatoid arthritis) ([place]) 02/13/2016   Sero  Negative  . 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 Sleep apnea 02/13/2016    Past Surgical History:  Procedure Laterality Date  . A-FLUTTER ABLATION     at [facility] 2018  . CARDIOVERSION  02/10/2018  . CARDIOVERSION N/A 05/13/2018   Procedure: CARDIOVERSION;  Surgeon: Sanda Klein, MD;  Location: [facility];  Service: Cardiovascular;  Laterality: N/A;  . CATARACT EXTRACTION       Current Outpatient Medications  Medication Sig Dispense Refill  . acetaminophen (TYLENOL) 500 MG tablet Take by mouth as needed.     Marland Kitchen albuterol (VENTOLIN HFA) 108 (90 Base) MCG/ACT inhaler Inhale 1 puff into the lungs every 6 (six) hours as needed.    . benazepril (LOTENSIN) 20 MG tablet Take 1 tablet (20 mg total) by mouth daily. 90 tablet 3  . HYDROcodone-acetaminophen (NORCO/VICODIN) 5-325 MG tablet Take 1 tablet by mouth 2 (two) times daily as needed for moderate pain. 60 tablet 0  . INCRUSE ELLIPTA 62.5 MCG/INH AEPB Inhale 1 puff into the lungs daily.    . metoprolol tartrate (LOPRESSOR) 50 MG tablet Take 1 tablet (50 mg total) by mouth 2 (two) times daily. 60 tablet 1  . predniSONE (DELTASONE) 20 MG tablet Take 3 tablets by mouth daily x1 day; then 2 tablet by mouth daily x2 days; then 1 tablet by mouth daily x2 days; then 1/2 tablet by mouth daily x3 days and stop prednisone. 12 tablet 0  . simvastatin (ZOCOR) 20 MG tablet TAKE 1 TABLET (20 MG TOTAL) BY MOUTH DAILY AT 6 PM 90 tablet 1  . tamsulosin (FLOMAX) 0.4 MG CAPS capsule Take by mouth daily.    Marland Kitchen torsemide (DEMADEX) 20 MG tablet TAKE 2 TABLETS (40 MG TOTAL) BY MOUTH 2 (TWO) TIMES DAILY. (Patient taking differently: Take 60 mg by mouth in the morning. ) 360 tablet 2  . warfarin (COUMADIN) 10 MG tablet TAKE 1/2 TO 1 TABLET DAILY AS DIRECTED BY COUMADIN CLINIC. 90 tablet 1   No current facility-administered medications for this visit.   Allergies:  Celebrex [celecoxib], Methotrexate derivatives, and Sulfa antibiotics   Social History: The patient  reports that he has been smoking cigarettes. He started smoking about 48 years ago. He has a 70.00 pack-year smoking history. He has never used smokeless tobacco. He reports that he does not drink alcohol and does not use drugs.   Family History: The patient's family history includes Diabetes in his son; Epilepsy in his son; Heart attack in his father; Hypertension in his father,  sister, sister, and sister; Prostate cancer in his father.   ROS:  Please see the history of present illness. Otherwise, complete review of systems is positive for none.  All other systems are reviewed and negative.   Physical Exam: VS:  BP 100/64   Pulse 88   Ht 6\' 1"  (1.854 m)   Wt (!) 384 lb 3.2 oz (174.3 kg)   SpO2 98% Comment: on 4 L O2 via [place]  BMI 50.69 kg/m , BMI Body mass index is 50.69 kg/m.  Wt Readings from Last 3 Encounters:  12/06/19 (!) 384 lb 3.2 oz (174.3 kg)  11/30/19 (!) 383 lb 13.1 oz (174.1 kg)  11/22/19 (!) 388 lb (176 kg)    General: Severe morbid obese patient appears comfortable at rest. Neck: Supple, no elevated JVP or carotid bruits, no thyromegaly. Lungs: Clear to auscultation, prolonged expiratory phase, nonlabored breathing at rest. Cardiac: Irregularly irregular rate and rhythm, no S3 or significant systolic murmur, no pericardial rub.  Abdomen: Soft, nontender, no hepatomegaly, bowel sounds present, no guarding or rebound. Extremities: No pitting edema, distal pulses 2+. Skin: Warm and dry. Musculoskeletal: No kyphosis. Neuropsychiatric: Alert and oriented x3, affect grossly appropriate.  ECG:  EKG November 29, 2019 atrial fibrillation with heart rate of 91.  ST and T wave abnormality consider anterolateral ischemia.  Recent Labwork: 11/27/2019: ALT 10; AST 15; B Natriuretic Peptide 323.0 11/30/2019: BUN 26; Creatinine, Ser 0.67; Hemoglobin 13.6; Magnesium 2.5; Platelets 205; Potassium 3.3; Sodium 139  No results found for: CHOL, TRIG, HDL, CHOLHDL, VLDL, LDLCALC, LDLDIRECT  Other Studies Reviewed Today:   10/2018 echo 1. Images are limited. 2. The left ventricle has grossly normal systolic function, with an ejection fraction of 55-60%. The cavity size was normal. There is mildly increased left ventricular wall thickness. Left ventricular diastolic Doppler parameters are indeterminate. 3. The right ventricle has normal systolic function. The  cavity was normal. There is no increase in right ventricular wall thickness. Right ventricular systolic pressure is normal with an estimated pressure of 12.5 mmHg. 4. The aortic valve is tricuspid. Mild calcification of the aortic valve. Mild aortic annular calcification noted. 5. The mitral valve is grossly normal. There is mild mitral annular calcification present. 6. The tricuspid valve is grossly normal. 7. The aorta is abnormal in size and structure. 8. There is mild dilatation of the aortic root. 9. The interatrial septum was not well visualized.  Assessment and Plan:  1. Acute on chronic diastolic heart failure ([place])   2. COPD with acute exacerbation ([place])   3. Atrial fibrillation, unspecified type ([place])   4. OSA (obstructive sleep apnea)   5. Obesity, Class III, BMI 40-49.9 (morbid obesity) ([place])    1. Atrial fibrillation, unspecified type (H[facility]) Pulses irregularly irregular at a rate of 88.  Patient was discontinued on dofetilide while inpatient recently.  His metoprolol was increased to 50 mg p.o. twice daily.  Continue metoprolol 50 mg p.o. twice daily.  Continue Coumadin 1/2 to 1 tablet daily as directed by Coumadin clinic.  2. Acute on chronic diastolic heart failure ([place]) Recent admission with acute on chronic diastolic heart failure.  Patient was diuresed and discharged on torsemide 40 mg p.o. twice daily.  His weight is approximately 4 pounds less than previous weight on November 22, 2019.  Today's weight is 384 pounds.  Advised the patient to continue to weigh daily and call if weight gain of 3 pounds in 24 hours or 5 pounds in 1 week.  Restrict fluids to approximately 60 ounces per day.  Restrict fluid intake to 1.5 to 2 L/day.  3. COPD with acute exacerbation ([place]) Significant history of smoking approximately 2 packs/day until he recently became ill and was admitted.  He states they took my cigarettes away.  Has not smoked since hospital discharge.  He is on continuous O2  at home 2 L.  Please refer to pulmonary for COPD evaluation and possible PFTs.  4. OSA (obstructive sleep apnea) Patient is on CPAP at home and compliant per his statement.  5. Obesity, Class III, BMI 40-49.9 (morbid obesity) (HCC) Significant obesity with associated OSA on CPAP and likely hypoventilation syndrome combined with COPD from smoking.  6.  Constipation Patient complaining of significant constipation.  States he has not had a bowel movement in 8 to 9 days since discharge from hospital.  He does not currently have a primary care provider.  He is asking for medication for constipation.  Start Linzess 72 mcg daily for 30  days only.  Medication Adjustments/Labs and Tests Ordered: Current medicines are reviewed at length with the patient today.  Concerns regarding medicines are outlined above.   Disposition: Follow-up with home Dr. Harl Bowie at scheduled December visit on December 22 3:20 PM.  Follow-up with Dr. Rayann Heman  Signed, Levell July, NP 12/06/2019 9:32 AM    [facility] [place], [place], [place] [address] Phone: [phone]; Fax: [phone]

## 2019-12-05 NOTE — Telephone Encounter (Signed)
Received telephone call from Tressia Danas (physical therapy) [facility]. Patient was discharged from [facility] on 8/18 with Acute respiratory failure with hypoxia  Mr.Gentry states that he was ordered physical therapy.(no PCP established yet) They are asking for a verbal order to do the therapy until PCP can be established.  Please call [phone]

## 2019-12-06 ENCOUNTER — Ambulatory Visit: Payer: Medicare Other | Admitting: Family Medicine

## 2019-12-06 ENCOUNTER — Ambulatory Visit (INDEPENDENT_AMBULATORY_CARE_PROVIDER_SITE_OTHER): Payer: Medicare Other | Admitting: *Deleted

## 2019-12-06 ENCOUNTER — Other Ambulatory Visit: Payer: Self-pay

## 2019-12-06 ENCOUNTER — Encounter: Payer: Self-pay | Admitting: Family Medicine

## 2019-12-06 VITALS — BP 100/64 | HR 88 | Ht 73.0 in | Wt 384.2 lb

## 2019-12-06 DIAGNOSIS — I5033 Acute on chronic diastolic (congestive) heart failure: Secondary | ICD-10-CM | POA: Diagnosis not present

## 2019-12-06 DIAGNOSIS — G4733 Obstructive sleep apnea (adult) (pediatric): Secondary | ICD-10-CM

## 2019-12-06 DIAGNOSIS — I4891 Unspecified atrial fibrillation: Secondary | ICD-10-CM

## 2019-12-06 DIAGNOSIS — Z5181 Encounter for therapeutic drug level monitoring: Secondary | ICD-10-CM

## 2019-12-06 DIAGNOSIS — J441 Chronic obstructive pulmonary disease with (acute) exacerbation: Secondary | ICD-10-CM

## 2019-12-06 LAB — POCT INR: INR: 3.1 — AB (ref 2.0–3.0)

## 2019-12-06 MED ORDER — LINACLOTIDE 72 MCG PO CAPS: 72.0000 ug | ORAL_CAPSULE | Freq: Every day | ORAL | 0 refills | Status: DC

## 2019-12-06 NOTE — Patient Instructions (Signed)
Continue warfarin 1 tablet daily except 1/2 tablet on Sundays, Tuesdays and Thursdays Recheck INR in 6 weeks.  

## 2019-12-06 NOTE — Telephone Encounter (Signed)
PT order is ok, needs to establish a pcp ASAP. I will not manage this further   Zandra Abts MD

## 2019-12-06 NOTE — Patient Instructions (Addendum)
Medication Instructions:   Your physician has recommended you make the following change in your medication:   Start linzess 72 mcg by mouth daily 30 minutes before first meal  Continue other medications the same  Labwork:  Your physician recommends that you return for lab work in: TODAY to check your BMET.   Testing/Procedures:  NONE  Follow-Up:  Your physician recommends that you schedule a follow-up appointment in: in December 2021 with [facility] as planned.  Any Other Special Instructions Will Be Listed Below (If Applicable).   Please contact [facility] for assistance finding a primary care provider [phone])   If you need a refill on your cardiac medications before your next appointment, please call your pharmacy.

## 2019-12-07 NOTE — Telephone Encounter (Signed)
Left message to return call for [facility].

## 2019-12-16 ENCOUNTER — Encounter: Payer: Self-pay | Admitting: Internal Medicine

## 2019-12-16 ENCOUNTER — Telehealth: Payer: Self-pay | Admitting: Internal Medicine

## 2019-12-16 ENCOUNTER — Telehealth (INDEPENDENT_AMBULATORY_CARE_PROVIDER_SITE_OTHER): Payer: Medicare Other | Admitting: Internal Medicine

## 2019-12-16 VITALS — BP 119/67 | HR 87 | Ht 73.0 in | Wt 384.0 lb

## 2019-12-16 DIAGNOSIS — I4819 Other persistent atrial fibrillation: Secondary | ICD-10-CM | POA: Diagnosis not present

## 2019-12-16 DIAGNOSIS — G4733 Obstructive sleep apnea (adult) (pediatric): Secondary | ICD-10-CM

## 2019-12-16 DIAGNOSIS — I5033 Acute on chronic diastolic (congestive) heart failure: Secondary | ICD-10-CM

## 2019-12-16 DIAGNOSIS — D6869 Other thrombophilia: Secondary | ICD-10-CM

## 2019-12-16 MED ORDER — METOPROLOL TARTRATE 75 MG PO TABS: 75.0000 mg | ORAL_TABLET | Freq: Two times a day (BID) | ORAL | 6 refills | Status: DC

## 2019-12-16 NOTE — Telephone Encounter (Signed)
°  Patient Consent for Virtual Visit         Jesus Rodriguez has provided verbal consent on 12/16/2019 for a virtual visit (video or telephone).   CONSENT FOR VIRTUAL VISIT FOR:  Jesus Rodriguez  By participating in this virtual visit I agree to the following:  I hereby voluntarily request, consent and authorize [facility] and its employed or contracted physicians, physician assistants, nurse practitioners or other licensed health care professionals (the Practitioner), to provide me with telemedicine health care services (the Services") as deemed necessary by the treating Practitioner. I acknowledge and consent to receive the Services by the Practitioner via telemedicine. I understand that the telemedicine visit will involve communicating with the Practitioner through live audiovisual communication technology and the disclosure of certain medical information by electronic transmission. I acknowledge that I have been given the opportunity to request an in-person assessment or other available alternative prior to the telemedicine visit and am voluntarily participating in the telemedicine visit.  I understand that I have the right to withhold or withdraw my consent to the use of telemedicine in the course of my care at any time, without affecting my right to future care or treatment, and that the Practitioner or I may terminate the telemedicine visit at any time. I understand that I have the right to inspect all information obtained and/or recorded in the course of the telemedicine visit and may receive copies of available information for a reasonable fee.  I understand that some of the potential risks of receiving the Services via telemedicine include:   Delay or interruption in medical evaluation due to technological equipment failure or disruption;  Information transmitted may not be sufficient (e.g. poor resolution of images) to allow for appropriate medical decision making by the Practitioner;  and/or   In rare instances, security protocols could fail, causing a breach of personal health information.  Furthermore, I acknowledge that it is my responsibility to provide information about my medical history, conditions and care that is complete and accurate to the best of my ability. I acknowledge that Practitioner's advice, recommendations, and/or decision may be based on factors not within their control, such as incomplete or inaccurate data provided by me or distortions of diagnostic images or specimens that may result from electronic transmissions. I understand that the practice of medicine is not an exact science and that Practitioner makes no warranties or guarantees regarding treatment outcomes. I acknowledge that a copy of this consent can be made available to me via my patient portal (Jesus Rodriguez), or I can request a printed copy by calling the office of [facility].    I understand that my insurance will be billed for this visit.   I have read or had this consent read to me.  I understand the contents of this consent, which adequately explains the benefits and risks of the Services being provided via telemedicine.   I have been provided ample opportunity to ask questions regarding this consent and the Services and have had my questions answered to my satisfaction.  I give my informed consent for the services to be provided through the use of telemedicine in my medical care

## 2019-12-16 NOTE — Progress Notes (Addendum)
Electrophysiology TeleHealth Note  Due to national recommendations of social distancing due to [place] 19, an audio telehealth visit is felt to be most appropriate for this patient at this time.  Verbal consent was obtained by me for the telehealth visit today.  The patient does not have capability for a virtual visit.  A phone visit is therefore required today.   Date:  12/16/2019   ID:  Jesus Rodriguez, DOB 03-30-1951, MRN [id number]  Location: patient's home  Provider location:  [place]  Evaluation Performed: Follow-up visit  PCP:  Patient, [name]   Electrophysiologist:  Dr Rayann Heman  Chief Complaint:  palpitations  History of Present Illness:    Jesus Rodriguez is a 69 y.o. male who presents via telehealth conferencing today.  Since last being seen in our clinic, the patient reports doing reasonably well. He was recently hospitalized for pneumonia.  He is making slow improvement.  His tikosyn was discontinued because he was in afib.  He has SOB.   Rare palpitations.  + edema.   Today, he denies symptoms of palpitations, chest pain,  dizziness, presyncope, or syncope.  The patient is otherwise without complaint today.     Past Medical History:  Diagnosis Date  . Anxiety 02/13/2016  . Atrial flutter ([place]) 02/13/2016   s/p CTI ablation at [facility]  . DDD (degenerative disc disease), lumbar 02/13/2016  . Essential hypertension 02/13/2016  . Insomnia 02/13/2016  . Mixed hyperlipidemia 02/13/2016  . Osteoarthritis of both hands   . Persistent atrial fibrillation ([place])   . RA (rheumatoid arthritis) ([place]) 02/13/2016   Sero Negative  . 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 Sleep apnea 02/13/2016    Past Surgical History:  Procedure Laterality Date  . A-FLUTTER ABLATION     at [facility] 2018  . CARDIOVERSION  02/10/2018  . CARDIOVERSION N/A 05/13/2018   Procedure: CARDIOVERSION;  Surgeon: Sanda Klein, MD;  Location: [facility];  Service: Cardiovascular;  Laterality: N/A;  .  CATARACT EXTRACTION      Current Outpatient Medications  Medication Sig Dispense Refill  . acetaminophen (TYLENOL) 500 MG tablet Take by mouth as needed.     Marland Kitchen albuterol (VENTOLIN HFA) 108 (90 Base) MCG/ACT inhaler Inhale 1 puff into the lungs every 6 (six) hours as needed.    . benazepril (LOTENSIN) 20 MG tablet Take 1 tablet (20 mg total) by mouth daily. 90 tablet 3  . HYDROcodone-acetaminophen (NORCO/VICODIN) 5-325 MG tablet Take 1 tablet by mouth 2 (two) times daily as needed for moderate pain. 60 tablet 0  . INCRUSE ELLIPTA 62.5 MCG/INH AEPB Inhale 1 puff into the lungs daily.    Marland Kitchen linaclotide (LINZESS) 72 MCG capsule Take 1 capsule (72 mcg total) by mouth daily before breakfast. 30 capsule 0  . metoprolol tartrate (LOPRESSOR) 50 MG tablet Take 1 tablet (50 mg total) by mouth 2 (two) times daily. 60 tablet 1  . predniSONE (DELTASONE) 20 MG tablet Take 3 tablets by mouth daily x1 day; then 2 tablet by mouth daily x2 days; then 1 tablet by mouth daily x2 days; then 1/2 tablet by mouth daily x3 days and stop prednisone. 12 tablet 0  . simvastatin (ZOCOR) 20 MG tablet TAKE 1 TABLET (20 MG TOTAL) BY MOUTH DAILY AT 6 PM 90 tablet 1  . tamsulosin (FLOMAX) 0.4 MG CAPS capsule Take by mouth daily.    Marland Kitchen torsemide (DEMADEX) 20 MG tablet TAKE 2 TABLETS (40 MG TOTAL) BY MOUTH 2 (TWO) TIMES DAILY. [place]  tablet 2  . warfarin (COUMADIN) 10 MG tablet TAKE 1/2 TO 1 TABLET DAILY AS DIRECTED BY COUMADIN CLINIC. 90 tablet 1   No current facility-administered medications for this visit.    Allergies:   Celebrex [celecoxib], Methotrexate derivatives, and Sulfa antibiotics   Social History:  The patient  reports that he has been smoking cigarettes. He started smoking about 48 years ago. He has a 70.00 pack-year smoking history. He has never used smokeless tobacco. He reports that he does not drink alcohol and does not use drugs.   ROS:  Please see the history of present illness.   All other systems are  personally reviewed and negative.    Exam:    Vital Signs:  BP 119/67   Pulse 87   Ht 6\' 1"  (1.854 m)   Wt (!) 384 lb (174.2 kg)   BMI 50.66 kg/m   Well sounding, alert and conversant   Labs/Other Tests and Data Reviewed:    Recent Labs: 11/27/2019: ALT 10; B Natriuretic Peptide 323.0 11/30/2019: BUN 26; Creatinine, Ser 0.67; Hemoglobin 13.6; Magnesium 2.5; Platelets 205; Potassium 3.3; Sodium 139   Wt Readings from Last 3 Encounters:  12/16/19 (!) 384 lb (174.2 kg)  12/06/19 (!) 384 lb 3.2 oz (174.3 kg)  11/30/19 (!) 383 lb 13.1 oz (174.1 kg)       ASSESSMENT & PLAN:    1.  Persistent afib/ atrial flutter chads2vasc score is 2.  He is on coumadin tikosyn stopped due to ineffictiveness Rate control is advised long Increase metoprolol to 75mg  BID  2. Morbid obesity Lifestyle modification advised  3. HTN Stable No change required today  4. OSA CPAP advised  5. Tobacco He quit since leaving the hospital  6. Acute on chronic diastolic dysfunction NYHA Class III CHF. Continue oral diuretics Recently hospitalized and required IV lasix Sodium restriction is advised Will screen for Alleviate HF   Risks, benefits and potential toxicities for medications prescribed and/or refilled reviewed with patient today.   Follow-up:  Dr Harl Bowie [facility] team at the next available time I will see in 6 months   Patient Risk:  after full review of this patients clinical status, I feel that they are at moderate risk at this time.  Today, I have spent 15 minutes with the patient with telehealth technology discussing arrhythmia management .    Army Fossa, MD  12/16/2019 8:35 AM     [facility] [address] [place] [address] [phone] (office) [phone] (fax)

## 2019-12-16 NOTE — Addendum Note (Signed)
Addended by: Laurine Blazer on: 12/16/2019 08:55 AM   Modules accepted: Orders

## 2019-12-23 ENCOUNTER — Telehealth: Payer: Self-pay | Admitting: Rheumatology

## 2019-12-23 MED ORDER — HYDROCODONE-ACETAMINOPHEN 5-325 MG PO TABS: 1.0000 | ORAL_TABLET | Freq: Two times a day (BID) | ORAL | 0 refills | Status: DC | PRN

## 2019-12-23 NOTE — Telephone Encounter (Signed)
Patient called requesting prescription refill of Hydrocodone to be sent to [facility] at [address] in [place].

## 2019-12-23 NOTE — Telephone Encounter (Signed)
Last Visit: 10/25/2019 Next Visit: 04/24/2020 UDS:10/25/2019 c/w Narc Agreement: 10/25/2019  Last Fill: 11/24/2019  Okay to refill Hydrocodone?

## 2019-12-25 ENCOUNTER — Other Ambulatory Visit: Payer: Self-pay | Admitting: Cardiology

## 2020-01-12 NOTE — Progress Notes (Signed)
Cardiology Office Note  Date: 01/13/2020   ID: Jesus Rodriguez, DOB 1950/08/13, MRN [id number]  PCP:  Patient, [name]  Cardiologist:  Carlyle Dolly, MD Electrophysiologist:  Thompson Grayer, MD   Chief Complaint: Follow-up atrial fibrillation/atrial flutter, chronic diastolic heart failure, COPD, OSA  History of Present Illness: Jesus Rodriguez is a 69 y.o. male with a history of atrial fibrillation/flutter, chronic diastolic heart failure, COPD, OSA on CPAP.  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  Previously followed by [facility].  Failed sotalol, started on dofetilide, followed by Dr. Rayann Heman at EP some recent palpitations, heart rates in the 60s and 70s.  Chronic diastolic heart failure.  11/01/2018 echo showed LVEF of 55 to 60%.  Phone call end of April concerning increased edema and shortness of breath.  Torsemide was increased to 60 mg p.o. twice daily.    Last saw Dr. Harl Bowie 11/22/2019 via telemedicine visit. He had some recent palpitations and heart rates were in the 60s and 70s.  Office weight on 09/06/2019 was 396 pounds.  Home weight was 388-393 pounds. Being followed by pulmonary in [place] for COPD and OSA.  Recent presentation to the emergency room 11/27/2019 with complaints of weakness and progressively worsening shortness of breath. Increasing difficulty breathing even walking around his house.  He admitted to having difficulty breathing for 2 years but had gotten progressively worse over the previous months much worse over the last 2 to 4 months.  He was admitted with acute on chronic respiratory failure with hypoxia in the setting of acute on chronic diastolic CHF exacerbation, COPD exacerbation and persistent atrial fibrillation.  He was hypokalemic with a potassium of 2.8.  BNP was 296.  Troponins were 63, 45, 39.  Patient was diuresed with IV diuretics and discharged on torsemide 40 mg p.o. twice daily.  Patient was given steroids and oral antibiotics for  COPD exacerbation and bronchiectasis.  He was to resume bronchodilator regimen and oxygen at home.  He was discharged on 11/30/2019   Last visit for  hospital follow-up.  He stated he was feeling a little better but is having some issues with nervousness.  Stated he was started on prednisone in the hospital along with antibiotics for his respiratory infection and believes the prednisone is making him nervous..  He stated his breathing was better but he has chronic dyspnea.  He was on 2 L nasal O2 continuously. Marland Kitchen  He was using his CPAP at home.  Significant history of morbid obesity with likely hypoventilation syndrome.  Stated he had not had a bowel movement in approximately 8 to 9 days since being discharged from the hospital.  He stated he had no PCP because his PCP had recently retired.  He denied any anginal symptoms, palpitations or arrhythmias, orthostatic symptoms, bleeding in stool or urine, claudication, DVT or PE-like symptoms, or lower extremity edema.  He did have orthopnea.  He was sleeping on an incline while using his CPAP.  He  lost approximately 4 pounds since August 10. Weight  in clinic was 393 pounds.  Recent encounter with Dr. Erich Montane via telemedicine visit for persistent atrial fibrillation.  His Tikosyn had been discontinued because he was in atrial fibrillation.  He was continuing to have shortness of breath.  Having rare palpitations.  Positive for LE edema.  Rate control was advised.  Metoprolol increased to 75 mg p.o. twice daily.  Lifestyle modification advised for morbid obesity.  Hypertension was stable and no changes were made.  Advised to wear his CPAP for OSA.  He was to continue oral diuretics.  Sodium restriction was advised.  Screened for Alleviate HF.  He is here for follow-up today after seeing Dr. Rayann Heman where his Tikosyn was discontinued and his metoprolol was increased to 75 mg p.o. twice daily.  States he is feeling much better.  Continues to have some shortness of  breath which is chronic.  Otherwise denies any other issues such as chest pain, pressure, tightness, orthostatic symptoms, CVA or TIA-like symptoms, bleeding issues.  Has chronic lower extremity edema.  He is using a Rollator/walker to ambulate with today.  Blood pressure and heart rate are well controlled.    Past Medical History:  Diagnosis Date  . Anxiety 02/13/2016  . Atrial flutter ([place]) 02/13/2016   s/p CTI ablation at [facility]  . DDD (degenerative disc disease), lumbar 02/13/2016  . Essential hypertension 02/13/2016  . Insomnia 02/13/2016  . Mixed hyperlipidemia 02/13/2016  . Osteoarthritis of both hands   . Persistent atrial fibrillation ([place])   . RA (rheumatoid arthritis) ([place]) 02/13/2016   Sero Negative  . 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 Sleep apnea 02/13/2016    Past Surgical History:  Procedure Laterality Date  . A-FLUTTER ABLATION     at [facility] 2018  . CARDIOVERSION  02/10/2018  . CARDIOVERSION N/A 05/13/2018   Procedure: CARDIOVERSION;  Surgeon: Sanda Klein, MD;  Location: [facility];  Service: Cardiovascular;  Laterality: N/A;  . CATARACT EXTRACTION      Current Outpatient Medications  Medication Sig Dispense Refill  . acetaminophen (TYLENOL) 500 MG tablet Take by mouth as needed.     Marland Kitchen albuterol (VENTOLIN HFA) 108 (90 Base) MCG/ACT inhaler Inhale 1 puff into the lungs every 6 (six) hours as needed.    . benazepril (LOTENSIN) 20 MG tablet TAKE 1 TABLET BY MOUTH EVERY DAY 90 tablet 1  . HYDROcodone-acetaminophen (NORCO/VICODIN) 5-325 MG tablet Take 1 tablet by mouth 2 (two) times daily as needed for moderate pain. 60 tablet 0  . metoprolol tartrate 75 MG TABS Take 75 mg by mouth 2 (two) times daily. 60 tablet 6  . simvastatin (ZOCOR) 20 MG tablet TAKE 1 TABLET (20 MG TOTAL) BY MOUTH DAILY AT 6 PM 90 tablet 1  . tamsulosin (FLOMAX) 0.4 MG CAPS capsule Take by mouth daily.    Marland Kitchen torsemide (DEMADEX) 20 MG tablet TAKE 2 TABLETS (40 MG TOTAL) BY MOUTH 2 (TWO) TIMES DAILY.  360 tablet 2  . warfarin (COUMADIN) 10 MG tablet TAKE 1/2 TO 1 TABLET DAILY AS DIRECTED BY COUMADIN CLINIC. 90 tablet 1   No current facility-administered medications for this visit.   Allergies:  Celebrex [celecoxib], Methotrexate derivatives, and Sulfa antibiotics   Social History: The patient  reports that he has been smoking cigarettes. He started smoking about 48 years ago. He has a 70.00 pack-year smoking history. He has never used smokeless tobacco. He reports that he does not drink alcohol and does not use drugs.   Family History: The patient's family history includes Diabetes in his son; Epilepsy in his son; Heart attack in his father; Hypertension in his father, sister, sister, and sister; Prostate cancer in his father.   ROS:  Please see the history of present illness. Otherwise, complete review of systems is positive for none.  All other systems are reviewed and negative.   Physical Exam: VS:  BP 110/70   Pulse 84   Ht 6\' 1"  (1.854 m)   Wt (!) 386 lb  9.6 oz (175.4 kg)   SpO2 94%   BMI 51.01 kg/m , BMI Body mass index is 51.01 kg/m.  Wt Readings from Last 3 Encounters:  01/13/20 (!) 386 lb 9.6 oz (175.4 kg)  12/16/19 (!) 384 lb (174.2 kg)  12/06/19 (!) 384 lb 3.2 oz (174.3 kg)    General: Severe morbid obese patient appears comfortable at rest. Neck: Supple, no elevated JVP or carotid bruits, no thyromegaly. Lungs: Clear to auscultation, prolonged expiratory phase, nonlabored breathing at rest. Cardiac: Irregularly irregular rate and rhythm, no S3 or significant systolic murmur, no pericardial rub. Abdomen: Soft, nontender, no hepatomegaly, bowel sounds present, no guarding or rebound. Extremities: No pitting edema, distal pulses 2+. Skin: Warm and dry. Musculoskeletal: No kyphosis. Neuropsychiatric: Alert and oriented x3, affect grossly appropriate.  ECG:  EKG November 29, 2019 atrial fibrillation with heart rate of 91.  ST and T wave abnormality consider anterolateral  ischemia.  Recent Labwork: 11/27/2019: ALT 10; AST 15; B Natriuretic Peptide 323.0 11/30/2019: BUN 26; Creatinine, Ser 0.67; Hemoglobin 13.6; Magnesium 2.5; Platelets 205; Potassium 3.3; Sodium 139  No results found for: CHOL, TRIG, HDL, CHOLHDL, VLDL, LDLCALC, LDLDIRECT  Other Studies Reviewed Today:   10/2018 echo 1. Images are limited. 2. The left ventricle has grossly normal systolic function, with an ejection fraction of 55-60%. The cavity size was normal. There is mildly increased left ventricular wall thickness. Left ventricular diastolic Doppler parameters are indeterminate. 3. The right ventricle has normal systolic function. The cavity was normal. There is no increase in right ventricular wall thickness. Right ventricular systolic pressure is normal with an estimated pressure of 12.5 mmHg. 4. The aortic valve is tricuspid. Mild calcification of the aortic valve. Mild aortic annular calcification noted. 5. The mitral valve is grossly normal. There is mild mitral annular calcification present. 6. The tricuspid valve is grossly normal. 7. The aorta is abnormal in size and structure. 8. There is mild dilatation of the aortic root. 9. The interatrial septum was not well visualized.  Assessment and Plan:   1. Atrial fibrillation, unspecified type (HCC) Pulses irregularly irregular at a rate of 84.  Patient was discontinued on dofetilide while inpatient recently.  At recent visit with Dr. Rayann Heman his metoprolol was increased to 75 mg daily.  He is tolerating the dose well with atrial fibrillation rate controlled.  Continue Coumadin 1/2 to 1 tablet daily as directed by Coumadin clinic.  2. Acute on chronic diastolic heart failure ([place]) Recent admission with acute on chronic diastolic heart failure.  Patient was diuresed and discharged on torsemide 40 mg p.o. twice daily.  His weight was approximately 4 pounds less than previous weight on November 22, 2019.  Last visit  weight  384  pounds.  Advised the patient to continue to weigh daily and call if weight gain of 3 pounds in 24 hours or 5 pounds in 1 week.  Restrict fluids to approximately 60 ounces per day.  Restrict fluid intake to 1.5 to 2 L/day.  Weight has increased only 2 pounds since last month.  He continues to adhere to restrictions.  3. COPD with acute exacerbation ([place]) Significant history of smoking approximately 2 packs/day until he recently became ill and was admitted.  He states they took my cigarettes away.  Has not smoked since hospital discharge.  He is on continuous O2 at home 2 L.  At last visit  referral was made to pulmonary for COPD evaluation and possible PFTs.  However he is seeing Dr. Gwyndolyn Saxon  [facility] pulmonologist in [place].  States he recently had 2 new inhalers prescribed by Dr. Koleen Nimrod.  4. OSA (obstructive sleep apnea) Patient is on CPAP at home and compliant per his statement.  5. Obesity, Class III, BMI 40-49.9 (morbid obesity) (HCC) Significant obesity with associated OSA on CPAP and likely hypoventilation syndrome combined with COPD from smoking.   Medication Adjustments/Labs and Tests Ordered: Current medicines are reviewed at length with the patient today.  Concerns regarding medicines are outlined above.   Disposition: Follow-up with Dr. Harl Bowie at scheduled December visit on December 22 3:20 PM.   Signed, Levell July, NP 01/13/2020 10:27 AM    [facility] [place], [place], [place] [address] Phone: [phone]; Fax: [phone]

## 2020-01-13 ENCOUNTER — Encounter: Payer: Self-pay | Admitting: Family Medicine

## 2020-01-13 ENCOUNTER — Ambulatory Visit (INDEPENDENT_AMBULATORY_CARE_PROVIDER_SITE_OTHER): Payer: Medicare Other | Admitting: Family Medicine

## 2020-01-13 ENCOUNTER — Ambulatory Visit (INDEPENDENT_AMBULATORY_CARE_PROVIDER_SITE_OTHER): Payer: Medicare Other | Admitting: *Deleted

## 2020-01-13 VITALS — BP 110/70 | HR 84 | Ht 73.0 in | Wt 386.6 lb

## 2020-01-13 DIAGNOSIS — Z5181 Encounter for therapeutic drug level monitoring: Secondary | ICD-10-CM

## 2020-01-13 DIAGNOSIS — Z23 Encounter for immunization: Secondary | ICD-10-CM

## 2020-01-13 DIAGNOSIS — I4819 Other persistent atrial fibrillation: Secondary | ICD-10-CM | POA: Diagnosis not present

## 2020-01-13 DIAGNOSIS — I4891 Unspecified atrial fibrillation: Secondary | ICD-10-CM | POA: Diagnosis not present

## 2020-01-13 DIAGNOSIS — I5033 Acute on chronic diastolic (congestive) heart failure: Secondary | ICD-10-CM | POA: Diagnosis not present

## 2020-01-13 DIAGNOSIS — Z006 Encounter for examination for normal comparison and control in clinical research program: Secondary | ICD-10-CM

## 2020-01-13 DIAGNOSIS — G4733 Obstructive sleep apnea (adult) (pediatric): Secondary | ICD-10-CM

## 2020-01-13 DIAGNOSIS — Z9989 Dependence on other enabling machines and devices: Secondary | ICD-10-CM

## 2020-01-13 LAB — POCT INR: INR: 1.7 — AB (ref 2.0–3.0)

## 2020-01-13 NOTE — Patient Instructions (Signed)
Take warfarin 1 1/2 tablets tonight then resume 1 tablet daily except 1/2 tablet on Sundays, Tuesdays and Thursdays Recheck INR in 3 weeks.  Been eating a a lot of salads and cabbage.  Will cut back

## 2020-01-13 NOTE — Patient Instructions (Signed)
Medication Instructions:   Your physician recommends that you continue on your current medications as directed. Please refer to the Current Medication list given to you today.  Labwork:  None  Testing/Procedures:  None  Follow-Up:  Your physician recommends that you schedule a follow-up appointment in: as planned with Dr. Harl Bowie on 04/04/2020 @3 :20 pm  Any Other Special Instructions Will Be Listed Below (If Applicable).  If you need a refill on your cardiac medications before your next appointment, please call your pharmacy.

## 2020-01-13 NOTE — Research (Signed)
Spoke with patient regarding Alleviate Heart Failure study. Patient given information and research will follow up with patient next week.

## 2020-01-23 ENCOUNTER — Telehealth: Payer: Self-pay

## 2020-01-23 MED ORDER — HYDROCODONE-ACETAMINOPHEN 5-325 MG PO TABS: 1.0000 | ORAL_TABLET | Freq: Two times a day (BID) | ORAL | 0 refills | Status: DC | PRN

## 2020-01-23 NOTE — Telephone Encounter (Signed)
Patient called requesting prescription refill of Hydrocodone to be [facility] to [facility] in [place], [place].

## 2020-01-23 NOTE — Telephone Encounter (Signed)
Last Visit: 10/25/2019 Next Visit: 04/24/2020 UDS:10/25/2019 c/w Narc Agreement: 10/25/2019  Last Fill: 12/23/2019  Okay to refill Hydrocodone?

## 2020-02-07 ENCOUNTER — Ambulatory Visit (INDEPENDENT_AMBULATORY_CARE_PROVIDER_SITE_OTHER): Payer: Medicare Other | Admitting: *Deleted

## 2020-02-07 DIAGNOSIS — Z5181 Encounter for therapeutic drug level monitoring: Secondary | ICD-10-CM

## 2020-02-07 DIAGNOSIS — I4891 Unspecified atrial fibrillation: Secondary | ICD-10-CM | POA: Diagnosis not present

## 2020-02-07 LAB — POCT INR: INR: 2.6 (ref 2.0–3.0)

## 2020-02-07 NOTE — Patient Instructions (Signed)
Continue warfarin 1 tablet daily except 1/2 tablet on Sundays, Tuesdays and Thursdays Recheck INR in 4 weeks.

## 2020-02-22 ENCOUNTER — Other Ambulatory Visit: Payer: Self-pay

## 2020-02-22 NOTE — Telephone Encounter (Signed)
Patient left a voicemail requesting prescription refill of Hydrocodone to [facility] to [facility] [address] in [place].

## 2020-02-22 NOTE — Telephone Encounter (Signed)
Patient advised he is due to update UDS and narcotic agreement. Patient will come to the office to update this week. Refill to be sent after.

## 2020-02-23 ENCOUNTER — Other Ambulatory Visit: Payer: Self-pay | Admitting: Radiology

## 2020-02-23 DIAGNOSIS — Z5181 Encounter for therapeutic drug level monitoring: Secondary | ICD-10-CM

## 2020-02-23 DIAGNOSIS — G8929 Other chronic pain: Secondary | ICD-10-CM

## 2020-02-24 MED ORDER — HYDROCODONE-ACETAMINOPHEN 5-325 MG PO TABS: 1.0000 | ORAL_TABLET | Freq: Two times a day (BID) | ORAL | 0 refills | Status: DC | PRN

## 2020-02-24 NOTE — Addendum Note (Signed)
Addended by: Carole Binning on: 02/24/2020 02:58 PM   Modules accepted: Orders

## 2020-02-24 NOTE — Telephone Encounter (Signed)
Last Visit: 10/25/2019 Next Visit: 04/24/2020 UDS: 10/25/2019 (update 02/23/2020 pending results.) Narc Agreement: 02/23/2020  Last Fill: 01/23/2020  Okay to refill Hydrocodone?

## 2020-02-24 NOTE — Addendum Note (Signed)
Addended by: Ofilia Neas on: 02/24/2020 03:17 PM   Modules accepted: Orders

## 2020-02-25 LAB — DRUG MONITOR, PANEL 5, W/CONF, URINE
Amphetamines: NEGATIVE ng/mL (ref <500)
Barbiturates: NEGATIVE ng/mL (ref <300)
Benzodiazepines: NEGATIVE ng/mL (ref <100)
Cocaine Metabolite: NEGATIVE ng/mL (ref <150)
Codeine: NEGATIVE ng/mL (ref <50)
Creatinine: 54.4 mg/dL
Hydrocodone: 387 ng/mL — ABNORMAL HIGH (ref <50)
Hydromorphone: 82 ng/mL — ABNORMAL HIGH (ref <50)
Marijuana Metabolite: NEGATIVE ng/mL (ref <20)
Methadone Metabolite: NEGATIVE ng/mL (ref <100)
Morphine: NEGATIVE ng/mL (ref <50)
Norhydrocodone: 208 ng/mL — ABNORMAL HIGH (ref <50)
Opiates: POSITIVE ng/mL — AB (ref <100)
Oxidant: NEGATIVE ug/mL
Oxycodone: NEGATIVE ng/mL (ref <100)
pH: 5.1 (ref 4.5–9.0)

## 2020-02-25 LAB — DM TEMPLATE

## 2020-02-26 NOTE — Progress Notes (Signed)
Labs are consistent with medication use.

## 2020-03-06 ENCOUNTER — Ambulatory Visit (INDEPENDENT_AMBULATORY_CARE_PROVIDER_SITE_OTHER): Payer: Medicare Other | Admitting: *Deleted

## 2020-03-06 DIAGNOSIS — Z5181 Encounter for therapeutic drug level monitoring: Secondary | ICD-10-CM | POA: Diagnosis not present

## 2020-03-06 DIAGNOSIS — I4891 Unspecified atrial fibrillation: Secondary | ICD-10-CM | POA: Diagnosis not present

## 2020-03-06 LAB — POCT INR: INR: 2.1 (ref 2.0–3.0)

## 2020-03-06 NOTE — Patient Instructions (Signed)
Continue warfarin 1 tablet daily except 1/2 tablet on Sundays, Tuesdays and Thursdays Recheck INR in 6 weeks.

## 2020-03-22 ENCOUNTER — Other Ambulatory Visit: Payer: Self-pay | Admitting: Cardiology

## 2020-03-23 ENCOUNTER — Other Ambulatory Visit: Payer: Self-pay

## 2020-03-23 MED ORDER — HYDROCODONE-ACETAMINOPHEN 5-325 MG PO TABS: 1.0000 | ORAL_TABLET | Freq: Two times a day (BID) | ORAL | 0 refills | Status: DC | PRN

## 2020-03-23 NOTE — Telephone Encounter (Signed)
Patient called requesting prescription refill of Hydrocodone [facility] [address] in [place], [place]

## 2020-03-23 NOTE — Telephone Encounter (Signed)
Last Visit: 10/25/2019 Next Visit: 04/24/2020 UDS: 02/23/2020 Narc Agreement: 02/23/2020   Last Fill: 02/24/2020  Okay to refill Hydrocodone?

## 2020-04-04 ENCOUNTER — Ambulatory Visit: Payer: Medicare Other | Admitting: Cardiology

## 2020-04-11 NOTE — Progress Notes (Deleted)
Office Visit Note  Patient: Jesus Rodriguez             Date of Birth: 08-03-1950           MRN: [id number]             PCP: Patient, [name] Referring: No ref. provider found Visit Date: 04/24/2020 Occupation: @GUAROCC @  Subjective:  No chief complaint on file.   History of Present Illness: Jesus Rodriguez is a 69 y.o. male ***   Activities of Daily Living:  Patient reports morning stiffness for *** {minute/hour:19697}.   Patient {ACTIONS;DENIES/REPORTS:21021675::"Denies"} nocturnal pain.  Difficulty dressing/grooming: {ACTIONS;DENIES/REPORTS:21021675::"Denies"} Difficulty climbing stairs: {ACTIONS;DENIES/REPORTS:21021675::"Denies"} Difficulty getting out of chair: {ACTIONS;DENIES/REPORTS:21021675::"Denies"} Difficulty using hands for taps, buttons, cutlery, and/or writing: {ACTIONS;DENIES/REPORTS:21021675::"Denies"}  No Rheumatology ROS completed.   PMFS History:  Patient Active Problem List   Diagnosis Date Noted  . Obesity, Class III, BMI 40-49.9 (morbid obesity) (HCC)   . COPD with acute exacerbation (HCC)   . Acute on chronic diastolic HF (heart failure) (HCC)   . Pressure injury of skin 11/28/2019  . Acute respiratory failure with hypoxia (HCC) 11/27/2019  . Atrial fibrillation (HCC) 05/11/2018  . Encounter for therapeutic drug monitoring 04/22/2018  . Persistent atrial fibrillation (HCC) 04/08/2018  . History of rheumatoid arthritis seronegative 09/10/2017  . High risk medications (not anticoagulants) long-term use 02/14/2016  . Class 3 obesity in adult 02/14/2016  . Depression 02/14/2016  . Osteoarthritis of both hands 02/13/2016  . Osteoarthritis of both knees 02/13/2016  . DDD (degenerative disc disease), lumbar 02/13/2016  . 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 Hypertension 02/13/2016  . Elevated cholesterol 02/13/2016  . Insomnia 02/13/2016  . Anxiety 02/13/2016  . Sleep apnea 02/13/2016  . Arrhythmia 02/13/2016    Past Medical History:  Diagnosis  Date  . Anxiety 02/13/2016  . Atrial flutter (HCC) 02/13/2016   s/p [facility]I [facility] at [facility]  . DDD (degenerative disc disease), lumbar 02/13/2016  . Essential hypertension 02/13/2016  . Insomnia 02/13/2016  . Mixed hyperlipidemia 02/13/2016  . Osteoarthritis of both hands   . Persistent atrial fibrillation (HCC)   . RA (rheumatoid arthritis) (HCC) 02/13/2016   Sero Negative  . 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 Sleep apnea 02/13/2016    Family History  Problem Relation Age of Onset  . Hypertension Father   . Heart attack Father   . Prostate cancer Father   . Hypertension Sister   . Hypertension Sister   . Hypertension Sister   . Epilepsy Son   . Diabetes Son    Past Surgical History:  Procedure Laterality Date  . A-FLUTTER ABLATION     at [facility] 2018  . CARDIOVERSION  02/10/2018  . CARDIOVERSION N/A 05/13/2018   Procedure: CARDIOVERSION;  Surgeon: 05/15/2018, MD;  Location: [facility];  Service: Cardiovascular;  Laterality: N/A;  . CATARACT EXTRACTION     Social History   Social History Narrative  . Not on file   Immunization History  Administered Date(s) Administered  . Fluad Quad(high Dose 65+) 01/13/2020  . Influenza, High Dose Seasonal PF 05/14/2018  . Influenza,inj,Quad PF,6+ Mos 01/04/2019     Objective: Vital Signs: There were no vitals taken for this visit.   Physical Exam   Musculoskeletal Exam: ***  CDAI Exam: CDAI Score: -- Patient Global: --; Provider Global: -- Swollen: --; Tender: -- Joint Exam 04/24/2020   No joint exam has been documented for this visit   There is currently no information documented on the homunculus.  Go to the Rheumatology activity and complete the homunculus joint exam.  Investigation: No additional findings.  Imaging: No results found.  Recent Labs: Lab Results  Component Value Date   WBC 8.0 11/30/2019   HGB 13.6 11/30/2019   PLT 205 11/30/2019   NA 139 11/30/2019   K 3.3 (L) 11/30/2019   CL 88 (L)  11/30/2019   CO2 40 (H) 11/30/2019   GLUCOSE 171 (H) 11/30/2019   BUN 26 (H) 11/30/2019   CREATININE 0.67 11/30/2019   BILITOT 1.1 11/27/2019   ALKPHOS 65 11/27/2019   AST 15 11/27/2019   ALT 10 11/27/2019   PROT 6.9 11/27/2019   ALBUMIN 3.1 (L) 11/27/2019   CALCIUM 8.6 (L) 11/30/2019   GFRAA >60 11/30/2019    Speciality Comments: No specialty comments available.  Procedures:  No procedures performed Allergies: Celebrex [celecoxib], Methotrexate derivatives, and Sulfa antibiotics   Assessment / Plan:     Visit Diagnoses: No diagnosis found.  Orders: No orders of the defined types were placed in this encounter.  No orders of the defined types were placed in this encounter.   Face-to-face time spent with patient was *** minutes. Greater than 50% of time was spent in counseling and coordination of care.  Follow-Up Instructions: No follow-ups on file.   Earnestine Mealing, CMA  Note - This record has been created using Editor, commissioning.  Chart creation errors have been sought, but may not always  have been located. Such creation errors do not reflect on  the standard of medical care.

## 2020-04-17 ENCOUNTER — Ambulatory Visit (INDEPENDENT_AMBULATORY_CARE_PROVIDER_SITE_OTHER): Payer: Medicare Other | Admitting: *Deleted

## 2020-04-17 DIAGNOSIS — Z5181 Encounter for therapeutic drug level monitoring: Secondary | ICD-10-CM

## 2020-04-17 DIAGNOSIS — I4891 Unspecified atrial fibrillation: Secondary | ICD-10-CM

## 2020-04-17 LAB — POCT INR: INR: 2 (ref 2.0–3.0)

## 2020-04-17 NOTE — Patient Instructions (Signed)
Take warfarin 1 tablet tonight then resume 1 tablet daily except 1/2 tablet on Sundays, Tuesdays and Thursdays Recheck INR in 6 weeks.

## 2020-04-18 NOTE — Progress Notes (Unsigned)
Cardiology Office Note  Date: 04/19/2020   ID: Jesus Rodriguez, DOB [birth year], MRN [id number]  PCP:  Patient, [name]  Cardiologist:  Dina Rich, MD Electrophysiologist:  Hillis Range, MD   Chief Complaint: Follow-up atrial fibrillation/atrial flutter, chronic diastolic heart failure, COPD, OSA  History of Present Illness: Jesus Rodriguez is a 70 y.o. male with a history of atrial fibrillation/flutter, chronic diastolic heart failure, COPD, OSA on CPAP.  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  Previously followed by [facility].  Failed sotalol, started on dofetilide, followed by Dr. Johney Frame at EP some recent palpitations, heart rates in the 60s and 70s.  Chronic diastolic heart failure.  11/01/2018 echo showed LVEF of 55 to 60%.  Phone call end of April concerning increased edema and shortness of breath.  Torsemide was increased to 60 mg p.o. twice daily.    Last saw Dr. Wyline Mood 11/22/2019 via telemedicine visit. He had some recent palpitations and heart rates were in the 60s and 70s.  Office weight on 09/06/2019 was 396 pounds.  Home weight was 388-393 pounds. Being followed by pulmonary in [place] for COPD and OSA.  Recent presentation to the emergency room 11/27/2019 with complaints of weakness and progressively worsening shortness of breath. Increasing difficulty breathing even walking around his house.  He admitted to having difficulty breathing for 2 years but had gotten progressively worse over the previous months much worse over the last 2 to 4 months.  He was admitted with acute on chronic respiratory failure with hypoxia in the setting of acute on chronic diastolic CHF exacerbation, COPD exacerbation and persistent atrial fibrillation.  He was hypokalemic with a potassium of 2.8.  BNP was 296.  Troponins were 63, 45, 39.  Patient was diuresed with IV diuretics and discharged on torsemide 40 mg p.o. twice daily.  Patient was given steroids and oral antibiotics for  COPD exacerbation and bronchiectasis.  He was to resume bronchodilator regimen and oxygen at home.  He was discharged on 11/30/2019   Last visit for  hospital follow-up.  He stated he was feeling a little better but was having some issues with nervousness.  Stated he was started on prednisone in the hospital along with antibiotics for his respiratory infection and believes the prednisone is making him nervous..  He stated his breathing was better but he has chronic dyspnea.  He was on 2 L nasal O2 continuously. He was using his CPAP at home.  Significant history of morbid obesity with likely hypoventilation syndrome.  Stated he had not had a bowel movement in approximately 8 to 9 days since being discharged from the hospital.  He stated he had no PCP because his PCP had recently retired.  He denied any anginal symptoms, palpitations or arrhythmias, orthostatic symptoms, bleeding in stool or urine, claudication, DVT or PE-like symptoms, or lower extremity edema.  He did have orthopnea.  He was sleeping on an incline while using his CPAP.  He  lost approximately 4 pounds since August 10. Weight  in clinic was 393 pounds.  Recent encounter with Dr. Karolee Stamps via telemedicine visit for persistent atrial fibrillation.  His Tikosyn had been discontinued because he was in atrial fibrillation.  He was continuing to have shortness of breath.  Having rare palpitations.  Positive for LE edema.  Rate control was advised.  Metoprolol increased to 75 mg p.o. twice daily.  Lifestyle modification advised for morbid obesity.  Hypertension was stable and no changes were made.  Advised  to wear his CPAP for OSA.  He was to continue oral diuretics.  Sodium restriction was advised.  Screened for Alleviate HF.  He was here for last visit after seeing Dr. Rayann Heman where his Tikosyn was discontinued and his metoprolol was increased to 75 mg p.o. twice daily.  Stated he was feeling much better. Continued to have some shortness of breath  which was chronic.  Otherwise denies any other issues such as chest pain, pressure, tightness, orthostatic symptoms, CVA or TIA-like symptoms, bleeding issues.  Has chronic lower extremity edema.  He was using a Rollator/walker to ambulate with today.  Blood pressure and heart rate were well controlled.  He is here today for 52-month follow-up.  States he is feeling good.  He has lost approximately 11 pounds since last visit.  He denies any recent anginal or exertional symptoms, orthostatic symptoms, sensation of palpitations or arrhythmias although he is in persistent atrial fibrillation.  Denies any PND, orthopnea.  No CVA or TIA-like symptoms.  No bleeding issues.  Denies any claudication-like symptoms, DVT or PE-like symptoms.  Has some mild chronic lower extremity edema.  Blood pressure is elevated today.  He states this is unusual since his blood pressure at home is usually [id number] to [id number] systolic.  He has some pending dental extractions and will need to hold his Coumadin prior.  He has a medical clearance for dental treatment form to be filled out and sent to [facility]  office.     Past Medical History:  Diagnosis Date  . Anxiety 02/13/2016  . Atrial flutter ([place]) 02/13/2016   s/p CTI ablation at [facility]  . DDD (degenerative disc disease), lumbar 02/13/2016  . Essential hypertension 02/13/2016  . Insomnia 02/13/2016  . Mixed hyperlipidemia 02/13/2016  . Osteoarthritis of both hands   . Persistent atrial fibrillation ([place])   . RA (rheumatoid arthritis) ([place]) 02/13/2016   Sero Negative  . 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 Sleep apnea 02/13/2016    Past Surgical History:  Procedure Laterality Date  . A-FLUTTER ABLATION     at [facility] 2018  . CARDIOVERSION  02/10/2018  . CARDIOVERSION N/A 05/13/2018   Procedure: CARDIOVERSION;  Surgeon: Sanda Klein, MD;  Location: [facility];  Service: Cardiovascular;  Laterality: N/A;  . CATARACT EXTRACTION      Current Outpatient  Medications  Medication Sig Dispense Refill  . albuterol (VENTOLIN HFA) 108 (90 Base) MCG/ACT inhaler Inhale 1 puff into the lungs every 6 (six) hours as needed.    . benazepril (LOTENSIN) 20 MG tablet TAKE 1 TABLET BY MOUTH EVERY DAY 90 tablet 1  . HYDROcodone-acetaminophen (NORCO/VICODIN) 5-325 MG tablet Take 1 tablet by mouth 2 (two) times daily as needed for moderate pain. 60 tablet 0  . metoprolol tartrate 75 MG TABS Take 75 mg by mouth 2 (two) times daily. 60 tablet 6  . simvastatin (ZOCOR) 20 MG tablet TAKE 1 TABLET (20 MG TOTAL) BY MOUTH DAILY AT 6 PM 90 tablet 1  . tamsulosin (FLOMAX) 0.4 MG CAPS capsule Take by mouth daily.    Marland Kitchen torsemide (DEMADEX) 20 MG tablet TAKE 2 TABLETS (40 MG TOTAL) BY MOUTH 2 (TWO) TIMES DAILY. 360 tablet 2  . warfarin (COUMADIN) 10 MG tablet TAKE 1/2 TO 1 TABLET DAILY AS DIRECTED BY COUMADIN CLINIC. 90 tablet 1   No current facility-administered medications for this visit.   Allergies:  Celebrex [celecoxib], Methotrexate derivatives, and Sulfa antibiotics   Social History: The patient  reports that he has  been smoking cigarettes. He started smoking about 48 years ago. He has a 70.00 pack-year smoking history. He has never used smokeless tobacco. He reports that he does not drink alcohol and does not use drugs.   Family History: The patient's family history includes Diabetes in his son; Epilepsy in his son; Heart attack in his father; Hypertension in his father, sister, sister, and sister; Prostate cancer in his father.   ROS:  Please see the history of present illness. Otherwise, complete review of systems is positive for none.  All other systems are reviewed and negative.   Physical Exam: VS:  BP 140/70   Pulse 93   Ht 6\' 1"  (1.854 m)   Wt (!) 375 lb (170.1 kg)   SpO2 94%   BMI 49.48 kg/m , BMI Body mass index is 49.48 kg/m.  Wt Readings from Last 3 Encounters:  04/19/20 (!) 375 lb (170.1 kg)  01/13/20 (!) 386 lb 9.6 oz (175.4 kg)  12/16/19 (!)  384 lb (174.2 kg)    General: Severe morbid obese patient appears comfortable at rest. Neck: Supple, no elevated JVP or carotid bruits, no thyromegaly. Lungs: Clear to auscultation, prolonged expiratory phase, nonlabored breathing at rest. Cardiac: Irregularly irregular rate and rhythm, no S3 or significant systolic murmur, no pericardial rub. Abdomen: Soft, nontender, no hepatomegaly, bowel sounds present, no guarding or rebound. Extremities: No pitting edema, distal pulses 2+. Skin: Warm and dry. Musculoskeletal: No kyphosis. Neuropsychiatric: Alert and oriented x3, affect grossly appropriate.  ECG:  EKG November 29, 2019 atrial fibrillation with heart rate of 91.  ST and T wave abnormality consider anterolateral ischemia.  Recent Labwork: 11/27/2019: ALT 10; AST 15; B Natriuretic Peptide 323.0 11/30/2019: BUN 26; Creatinine, Ser 0.67; Hemoglobin 13.6; Magnesium 2.5; Platelets 205; Potassium 3.3; Sodium 139  No results found for: CHOL, TRIG, HDL, CHOLHDL, VLDL, LDLCALC, LDLDIRECT  Other Studies Reviewed Today:   10/2018 echo 1. Images are limited. 2. The left ventricle has grossly normal systolic function, with an ejection fraction of 55-60%. The cavity size was normal. There is mildly increased left ventricular wall thickness. Left ventricular diastolic Doppler parameters are indeterminate. 3. The right ventricle has normal systolic function. The cavity was normal. There is no increase in right ventricular wall thickness. Right ventricular systolic pressure is normal with an estimated pressure of 12.5 mmHg. 4. The aortic valve is tricuspid. Mild calcification of the aortic valve. Mild aortic annular calcification noted. 5. The mitral valve is grossly normal. There is mild mitral annular calcification present. 6. The tricuspid valve is grossly normal. 7. The aorta is abnormal in size and structure. 8. There is mild dilatation of the aortic root. 9. The interatrial septum was not  well visualized.  Assessment and Plan:   1. Atrial fibrillation, unspecified type (HCC) Pulses irregularly irregular at a rate of 93.  Patient was discontinued on dofetilide while inpatient recently.  At recent visit with Dr. Rayann Heman his metoprolol was increased to 75 mg daily.  He is tolerating the dose well with atrial fibrillation rate controlled.  Continue Coumadin 1/2 to 1 tablet daily as directed by Coumadin clinic.  2. Acute on chronic diastolic heart failure (HCC) Continue torsemide 60 mg p.o. twice daily.  His weight is down about 11 pounds since last visit.  States he is feeling much better and denies any significant shortness of breath, or increased lower extremity edema.  Advised the patient to continue to weigh daily and call if weight gain of 3 pounds in 24  hours or 5 pounds in 1 week.  Restrict fluids to approximately 60 ounces per day. He continues to adhere to restrictions.  3. COPD with acute exacerbation ([place]) Significant history of smoking approximately 2 packs/day until he recently became ill and was admitted.  He states they took my cigarettes away.  Has not smoked since hospital discharge.  He is on continuous O2 at home 2 L.  At last visit  referral was made to pulmonary for COPD evaluation and possible PFTs.  However he is seeing Dr. Linde Gillis pulmonologist in [place].  States he recently had 2 new inhalers prescribed by Dr. Koleen Nimrod.  4. OSA (obstructive sleep apnea) Patient is on CPAP at home and compliant per his statement.  Patient states he is pending receiving a new CPAP machine.  5. Obesity, Class III, BMI 40-49.9 (morbid obesity) (HCC) Significant obesity with associated OSA on CPAP and likely hypoventilation syndrome combined with COPD from smoking.  Advised weight loss   Medication Adjustments/Labs and Tests Ordered: Current medicines are reviewed at length with the patient today.  Concerns regarding medicines are outlined above.    Disposition: Follow-up with Dr. Harl Bowie or APP 6 months  signed, Levell July, NP 04/19/2020 2:30 PM    [facility], [place], [place] [address] Phone: [phone]; Fax: [phone]

## 2020-04-19 ENCOUNTER — Encounter: Payer: Self-pay | Admitting: Family Medicine

## 2020-04-19 ENCOUNTER — Other Ambulatory Visit: Payer: Self-pay

## 2020-04-19 ENCOUNTER — Ambulatory Visit (INDEPENDENT_AMBULATORY_CARE_PROVIDER_SITE_OTHER): Payer: Medicare Other | Admitting: Family Medicine

## 2020-04-19 VITALS — BP 140/70 | HR 93 | Ht 73.0 in | Wt 375.0 lb

## 2020-04-19 DIAGNOSIS — I4811 Longstanding persistent atrial fibrillation: Secondary | ICD-10-CM | POA: Diagnosis not present

## 2020-04-19 DIAGNOSIS — Z9989 Dependence on other enabling machines and devices: Secondary | ICD-10-CM

## 2020-04-19 DIAGNOSIS — G4733 Obstructive sleep apnea (adult) (pediatric): Secondary | ICD-10-CM | POA: Diagnosis not present

## 2020-04-19 DIAGNOSIS — J441 Chronic obstructive pulmonary disease with (acute) exacerbation: Secondary | ICD-10-CM | POA: Diagnosis not present

## 2020-04-19 DIAGNOSIS — I5033 Acute on chronic diastolic (congestive) heart failure: Secondary | ICD-10-CM

## 2020-04-19 NOTE — Patient Instructions (Signed)

## 2020-04-22 ENCOUNTER — Encounter (HOSPITAL_COMMUNITY): Payer: Self-pay | Admitting: *Deleted

## 2020-04-22 ENCOUNTER — Other Ambulatory Visit: Payer: Self-pay

## 2020-04-22 ENCOUNTER — Emergency Department (HOSPITAL_COMMUNITY)
Admission: EM | Admit: 2020-04-22 | Discharge: 2020-04-23 | Disposition: A | Discharge status: Home / Self Care | Payer: Medicare Other | Attending: Emergency Medicine | Admitting: Emergency Medicine

## 2020-04-22 ENCOUNTER — Emergency Department (HOSPITAL_COMMUNITY): Payer: Medicare Other

## 2020-04-22 DIAGNOSIS — Z8547 Personal history of malignant neoplasm of testis: Secondary | ICD-10-CM | POA: Insufficient documentation

## 2020-04-22 DIAGNOSIS — R1011 Right upper quadrant pain: Secondary | ICD-10-CM | POA: Insufficient documentation

## 2020-04-22 DIAGNOSIS — I5033 Acute on chronic diastolic (congestive) heart failure: Secondary | ICD-10-CM | POA: Insufficient documentation

## 2020-04-22 DIAGNOSIS — F17210 Nicotine dependence, cigarettes, uncomplicated: Secondary | ICD-10-CM | POA: Diagnosis not present

## 2020-04-22 DIAGNOSIS — R0789 Other chest pain: Secondary | ICD-10-CM | POA: Insufficient documentation

## 2020-04-22 DIAGNOSIS — R609 Edema, unspecified: Secondary | ICD-10-CM | POA: Insufficient documentation

## 2020-04-22 DIAGNOSIS — Z20822 Contact with and (suspected) exposure to covid-19: Secondary | ICD-10-CM | POA: Insufficient documentation

## 2020-04-22 DIAGNOSIS — Z7901 Long term (current) use of anticoagulants: Secondary | ICD-10-CM | POA: Insufficient documentation

## 2020-04-22 DIAGNOSIS — J441 Chronic obstructive pulmonary disease with (acute) exacerbation: Secondary | ICD-10-CM | POA: Insufficient documentation

## 2020-04-22 DIAGNOSIS — R079 Chest pain, unspecified: Secondary | ICD-10-CM

## 2020-04-22 DIAGNOSIS — I110 Hypertensive heart disease with heart failure: Secondary | ICD-10-CM | POA: Diagnosis not present

## 2020-04-22 DIAGNOSIS — Z79899 Other long term (current) drug therapy: Secondary | ICD-10-CM | POA: Insufficient documentation

## 2020-04-22 LAB — CBC WITH DIFFERENTIAL/PLATELET
Abs Immature Granulocytes: 0.01 10*3/uL (ref 0.00–0.07)
Basophils Absolute: 0.1 10*3/uL (ref 0.0–0.1)
Basophils Relative: 1 %
Eosinophils Absolute: 0.8 10*3/uL — ABNORMAL HIGH (ref 0.0–0.5)
Eosinophils Relative: 9 %
HCT: 43.4 % (ref 39.0–52.0)
Hemoglobin: 13.8 g/dL (ref 13.0–17.0)
Immature Granulocytes: 0 %
Lymphocytes Relative: 19 %
Lymphs Abs: 1.6 10*3/uL (ref 0.7–4.0)
MCH: 30.3 pg (ref 26.0–34.0)
MCHC: 31.8 g/dL (ref 30.0–36.0)
MCV: 95.4 fL (ref 80.0–100.0)
Monocytes Absolute: 0.7 10*3/uL (ref 0.1–1.0)
Monocytes Relative: 8 %
Neutro Abs: 5.6 10*3/uL (ref 1.7–7.7)
Neutrophils Relative %: 63 %
Platelets: 202 10*3/uL (ref 150–400)
RBC: 4.55 MIL/uL (ref 4.22–5.81)
RDW: 14.1 % (ref 11.5–15.5)
WBC: 8.8 10*3/uL (ref 4.0–10.5)
nRBC: 0 % (ref 0.0–0.2)

## 2020-04-22 LAB — PROTIME-INR
INR: 2.2 — ABNORMAL HIGH (ref 0.8–1.2)
Prothrombin Time: 23.9 seconds — ABNORMAL HIGH (ref 11.4–15.2)

## 2020-04-22 LAB — LIPASE, BLOOD: Lipase: 23 U/L (ref 11–51)

## 2020-04-22 LAB — COMPREHENSIVE METABOLIC PANEL
ALT: 18 U/L (ref 0–44)
AST: 22 U/L (ref 15–41)
Albumin: 3.5 g/dL (ref 3.5–5.0)
Alkaline Phosphatase: 71 U/L (ref 38–126)
Anion gap: 10 (ref 5–15)
BUN: 38 mg/dL — ABNORMAL HIGH (ref 8–23)
CO2: 31 mmol/L (ref 22–32)
Calcium: 9 mg/dL (ref 8.9–10.3)
Chloride: 97 mmol/L — ABNORMAL LOW (ref 98–111)
Creatinine, Ser: 1.32 mg/dL — ABNORMAL HIGH (ref 0.61–1.24)
GFR, Estimated: 58 mL/min — ABNORMAL LOW (ref >60)
Glucose, Bld: 136 mg/dL — ABNORMAL HIGH (ref 70–99)
Potassium: 4.5 mmol/L (ref 3.5–5.1)
Sodium: 138 mmol/L (ref 135–145)
Total Bilirubin: 0.6 mg/dL (ref 0.3–1.2)
Total Protein: 7.4 g/dL (ref 6.5–8.1)

## 2020-04-22 LAB — D-DIMER, QUANTITATIVE: D-Dimer, Quant: 0.34 ug/mL-FEU (ref 0.00–0.50)

## 2020-04-22 LAB — TROPONIN I (HIGH SENSITIVITY)
Troponin I (High Sensitivity): 3 ng/L (ref <18)
Troponin I (High Sensitivity): 3 ng/L (ref <18)

## 2020-04-22 MED ORDER — IOHEXOL 300 MG/ML  SOLN
100.0000 mL | Freq: Once | INTRAMUSCULAR | Status: AC | PRN
  Administered 2020-04-22: 100 mL via INTRAVENOUS

## 2020-04-22 MED ORDER — KETOROLAC TROMETHAMINE 30 MG/ML IJ SOLN
15.0000 mg | Freq: Once | INTRAMUSCULAR | Status: AC
  Administered 2020-04-22: 15 mg via INTRAVENOUS
  Filled 2020-04-22: qty 1

## 2020-04-22 MED ORDER — HYDROMORPHONE HCL 1 MG/ML IJ SOLN
1.0000 mg | Freq: Once | INTRAMUSCULAR | Status: AC
  Administered 2020-04-22: 1 mg via INTRAVENOUS
  Filled 2020-04-22: qty 1

## 2020-04-22 MED ORDER — MORPHINE SULFATE (PF) 4 MG/ML IV SOLN
4.0000 mg | Freq: Once | INTRAVENOUS | Status: AC
  Administered 2020-04-22: 4 mg via INTRAVENOUS
  Filled 2020-04-22: qty 1

## 2020-04-22 MED ORDER — FENTANYL CITRATE (PF) 100 MCG/2ML IJ SOLN
50.0000 ug | Freq: Once | INTRAMUSCULAR | Status: AC
  Administered 2020-04-22: 50 ug via INTRAVENOUS
  Filled 2020-04-22: qty 2

## 2020-04-22 MED ORDER — HYDROCODONE-ACETAMINOPHEN 5-325 MG PO TABS: 1.0000 | ORAL_TABLET | Freq: Four times a day (QID) | ORAL | 0 refills | Status: DC | PRN

## 2020-04-22 MED ORDER — ONDANSETRON HCL 4 MG/2ML IJ SOLN
4.0000 mg | Freq: Once | INTRAMUSCULAR | Status: AC
  Administered 2020-04-22: 4 mg via INTRAVENOUS
  Filled 2020-04-22: qty 2

## 2020-04-22 NOTE — ED Provider Notes (Signed)
[facility] EMERGENCY DEPARTMENT Provider Note   CSN: [id number] Arrival date & time: 04/22/20  1711     History Chief Complaint  Patient presents with  . Chest Pain    Jesus Rodriguez is a 70 y.o. male with a history of atrial fib with a unsuccessful ablation, hypertension, rheumatoid arthritis, history of sleep apnea and anxiety presenting with sudden onset of right upper abdomen to lower chest pain which radiates into his right shoulder blade.  He was sitting in his home watching a ball game when his symptoms suddenly began, escalating until just prior to arrival.  He reports 10 out of 10 sharp pain which is constant worse with palpation and deep inspiration.  He denies feeling short of breath but is limiting deep inspiration secondary to pain.  He denies palpitations, diaphoresis, he did have nausea and 1 episode of emesis prior to onset of his symptoms.  He also reports having an episode of diarrhea last night but then woke today feeling well.  He last ate a breakfast of eggs and sausage around 10:30 AM no other p.o. intake.  He took a hydrocodone and Tylenol prior to arrival with no symptom relief.  HPI     Past Medical History:  Diagnosis Date  . Anxiety 02/13/2016  . Atrial flutter ([place]) 02/13/2016   s/p CTI ablation at [facility]  . DDD (degenerative disc disease), lumbar 02/13/2016  . Essential hypertension 02/13/2016  . Insomnia 02/13/2016  . Mixed hyperlipidemia 02/13/2016  . Osteoarthritis of both hands   . Persistent atrial fibrillation ([place])   . RA (rheumatoid arthritis) ([place]) 02/13/2016   Sero Negative  . 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 Sleep apnea 02/13/2016    Patient Active Problem List   Diagnosis Date Noted  . Obesity, Class III, BMI 40-49.9 (morbid obesity) ([place])   . COPD with acute exacerbation ([place])   . Acute on chronic diastolic HF (heart failure) ([place])   . Pressure injury of skin 11/28/2019  . Acute respiratory failure with hypoxia ([place]) 11/27/2019  . Atrial  fibrillation ([place]) 05/11/2018  . Encounter for therapeutic drug monitoring 04/22/2018  . Persistent atrial fibrillation ([place]) 04/08/2018  . History of rheumatoid arthritis seronegative 09/10/2017  . High risk medications (not anticoagulants) long-term use 02/14/2016  . Class 3 obesity in adult 02/14/2016  . Depression 02/14/2016  . Osteoarthritis of both hands 02/13/2016  . Osteoarthritis of both knees 02/13/2016  . DDD (degenerative disc disease), lumbar 02/13/2016  . 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 Hypertension 02/13/2016  . Elevated cholesterol 02/13/2016  . Insomnia 02/13/2016  . Anxiety 02/13/2016  . Sleep apnea 02/13/2016  . Arrhythmia 02/13/2016    Past Surgical History:  Procedure Laterality Date  . A-FLUTTER ABLATION     at [facility] 2018  . CARDIOVERSION  02/10/2018  . CARDIOVERSION N/A 05/13/2018   Procedure: CARDIOVERSION;  Surgeon: Sanda Klein, MD;  Location: [facility];  Service: Cardiovascular;  Laterality: N/A;  . CATARACT EXTRACTION         Family History  Problem Relation Age of Onset  . Hypertension Father   . Heart attack Father   . Prostate cancer Father   . Hypertension Sister   . Hypertension Sister   . Hypertension Sister   . Epilepsy Son   . Diabetes Son     Social History   Tobacco Use  . Smoking status: Current Every Day Smoker    Packs/day: 2.00    Years: 35.00    Pack years: 70.00  Types: Cigarettes    Start date: 05/28/1971  . Smokeless tobacco: Never Used  Vaping Use  . Vaping Use: Never used  Substance Use Topics  . Alcohol use: No  . Drug use: No    Home Medications Prior to Admission medications   Medication Sig Start Date End Date Taking? Authorizing Provider  albuterol (VENTOLIN HFA) 108 (90 Base) MCG/ACT inhaler Inhale 1 puff into the lungs every 6 (six) hours as needed. 11/10/19   [provider]  benazepril (LOTENSIN) 20 MG tablet TAKE 1 TABLET BY MOUTH EVERY DAY 12/26/19   Arnoldo Lenis, MD   HYDROcodone-acetaminophen (NORCO/VICODIN) 5-325 MG tablet Take 1 tablet by mouth 2 (two) times daily as needed for moderate pain. 03/23/20   Bo Merino, MD  metoprolol tartrate 75 MG TABS Take 75 mg by mouth 2 (two) times daily. 12/16/19   Allred, Jeneen Rinks, MD  simvastatin (ZOCOR) 20 MG tablet TAKE 1 TABLET (20 MG TOTAL) BY MOUTH DAILY AT 6 PM 03/22/20   Arnoldo Lenis, MD  tamsulosin (FLOMAX) 0.4 MG CAPS capsule Take by mouth daily. 07/23/17   [provider]  torsemide (DEMADEX) 20 MG tablet Take 60 mg by mouth 2 (two) times daily.    [provider]  TRELEGY ELLIPTA 100-62.5-25 MCG/INH AEPB Inhale 1 puff into the lungs daily. 04/02/20   [provider]  warfarin (COUMADIN) 10 MG tablet TAKE 1/2 TO 1 TABLET DAILY AS DIRECTED BY COUMADIN CLINIC. 10/10/19   Arnoldo Lenis, MD    Allergies    Celebrex [celecoxib], Methotrexate derivatives, and Sulfa antibiotics  Review of Systems   Review of Systems  Constitutional: Negative for chills and fever.  HENT: Negative for congestion and sore throat.   Eyes: Negative.   Respiratory: Negative for chest tightness and shortness of breath.   Cardiovascular: Positive for chest pain.  Gastrointestinal: Positive for abdominal pain, nausea and vomiting.  Genitourinary: Negative.   Musculoskeletal: Negative for arthralgias, joint swelling and neck pain.  Skin: Negative.  Negative for rash and wound.  Neurological: Negative for dizziness, weakness, light-headedness, numbness and headaches.  Psychiatric/Behavioral: Negative.     Physical Exam Updated Vital Signs BP (!) 111/54 (BP Location: Left Arm)   Pulse 80   Temp 97.8 F (36.6 C) (Oral)   Resp (!) 24   Ht 6\' 1"  (1.854 m)   Wt (!) 169.6 kg   SpO2 93%   BMI 49.34 kg/m   Physical Exam Vitals and nursing note reviewed.  Constitutional:      General: He is not in acute distress.    Appearance: He is well-developed and well-nourished.  HENT:     Head:  Normocephalic and atraumatic.     Mouth/Throat:     Mouth: Mucous membranes are moist.  Eyes:     General: No scleral icterus.    Conjunctiva/sclera: Conjunctivae normal.  Cardiovascular:     Rate and Rhythm: Normal rate and regular rhythm.     Pulses: Intact distal pulses.     Heart sounds: Normal heart sounds.  Pulmonary:     Effort: Pulmonary effort is normal.     Breath sounds: Normal breath sounds. No wheezing.  Abdominal:     General: Abdomen is protuberant. Bowel sounds are normal.     Palpations: Abdomen is soft.     Tenderness: There is abdominal tenderness in the right upper quadrant. There is no right CVA tenderness, left CVA tenderness or guarding. Negative signs include Murphy's sign.  Comments: Exam limited by body habitus. No guarding.    Musculoskeletal:        General: Normal range of motion.     Cervical back: Normal range of motion.     Right lower leg: Edema present.     Left lower leg: Edema present.     Comments: Trace edema bilateral lower legs.  Erythema and skin changes bilaterally c/w chronic venous insufficiency.  Excoriations on lower legs.    Skin:    General: Skin is warm and dry.  Neurological:     Mental Status: He is alert.  Psychiatric:        Mood and Affect: Mood and affect normal.     ED Results / Procedures / Treatments   Labs (all labs ordered are listed, but only abnormal results are displayed) Labs Reviewed  CBC WITH DIFFERENTIAL/PLATELET - Abnormal; Notable for the following components:      Result Value   Eosinophils Absolute 0.8 (*)    All other components within normal limits  COMPREHENSIVE METABOLIC PANEL - Abnormal; Notable for the following components:   Chloride 97 (*)    Glucose, Bld 136 (*)    BUN 38 (*)    Creatinine, Ser 1.32 (*)    GFR, Estimated 58 (*)    All other components within normal limits  LIPASE, BLOOD  D-DIMER, QUANTITATIVE (NOT AT [facility])  TROPONIN I (HIGH SENSITIVITY)  TROPONIN I (HIGH SENSITIVITY)     EKG None  Radiology DG Chest 2 View  Result Date: 04/22/2020 CLINICAL DATA:  Right-sided chest pain over the last 2 hours. EXAM: CHEST - 2 VIEW COMPARISON:  11/30/2019 FINDINGS: Chronic elevation of the left hemidiaphragm. Chronic cardiomegaly. Chronic interstitial lung markings in the mid and lower lungs. No sign of active infiltrate, mass, effusion or collapse. IMPRESSION: No active disease. Chronic cardiomegaly. Chronic interstitial lung markings. Chronic elevation of the left hemidiaphragm. Electronically Signed   By: Nelson Chimes M.D.   On: 04/22/2020 18:43    Procedures Procedures (including critical care time)  Medications Ordered in ED Medications  morphine 4 MG/ML injection 4 mg (has no administration in time range)  ondansetron (ZOFRAN) injection 4 mg (has no administration in time range)    ED Course  I have reviewed the triage vital signs and the nursing notes.  Pertinent labs & imaging results that were available during my care of the patient were reviewed by me and considered in my medical decision making (see chart for details).    MDM Rules/Calculators/A&P HEAR Score: 4                        Pt with sudden onset right lower chest wall pain with radiation to right shoulder blade.  N/v x 1.  TTP RUQ.  Pending chest pain rule out, also concerns for possible biliary colic given presentation and radiating sx.  CT abd/pelvis pending.  Ekg, 1st trop negative, d dimer negative.  Pending delta troponin and Ct imaging.  Heart score 4, atypical presentation for ACS - right sided, sudden, sharp, non exertional.    Pt signed out to Providence Lanius PA-C who assumes care.  Final Clinical Impression(s) / ED Diagnoses Final diagnoses:  None    Rx / DC Orders ED Discharge Orders    None       Landis Martins 04/22/20 1929    Fredia Sorrow, MD 05/03/20 1623

## 2020-04-22 NOTE — ED Provider Notes (Signed)
Care assumed from Evalee Jefferson, PA-C at shift change with labs and CT abd/pelvis pending.   In brief, this patient is a 70 y.o. M past history of A. fib, hypertension, rheumatoid arthritis presents for evaluation of right upper abdominal/lower chest pain which radiates into his right shoulder.  He reports that this morning, he ate breakfast of sausage, eggs.  He reports that about 1030, he was watching a game and states he started getting pain.  He states it is worse with palpation and deep inspiration.  He does not feel short of breath but does feel like the pain is worse when he takes a deep breath.  He had 1 episode of nausea and vomiting prior to pain.  He denies any fever.   Physical Exam  BP 119/70   Pulse 100   Temp 97.8 F (36.6 C) (Oral)   Resp (!) 21   Ht 6\' 1"  (1.854 m)   Wt (!) 169.6 kg   SpO2 92%   BMI 49.34 kg/m   Physical Exam   Abdomen is soft, non-distended. Tenderness noted to the RUQ.  Lungs clear to auscultation, no wheezing.   ED Course/Procedures     Procedures   Results for orders placed or performed during the hospital encounter of 04/22/20 (from the past 24 hour(s))  Troponin I (High Sensitivity)     Status: None   Collection Time: 04/22/20  5:40 PM  Result Value Ref Range   Troponin I (High Sensitivity) 3 <18 ng/L  CBC with Differential/Platelet     Status: Abnormal   Collection Time: 04/22/20  5:40 PM  Result Value Ref Range   WBC 8.8 4.0 - 10.5 K/uL   RBC 4.55 4.22 - 5.81 MIL/uL   Hemoglobin 13.8 13.0 - 17.0 g/dL   HCT 43.4 39.0 - 52.0 %   MCV 95.4 80.0 - 100.0 fL   MCH 30.3 26.0 - 34.0 pg   MCHC 31.8 30.0 - 36.0 g/dL   RDW 14.1 11.5 - 15.5 %   Platelets 202 150 - 400 K/uL   nRBC 0.0 0.0 - 0.2 %   Neutrophils Relative % 63 %   Neutro Abs 5.6 1.7 - 7.7 K/uL   Lymphocytes Relative 19 %   Lymphs Abs 1.6 0.7 - 4.0 K/uL   Monocytes Relative 8 %   Monocytes Absolute 0.7 0.1 - 1.0 K/uL   Eosinophils Relative 9 %   Eosinophils Absolute 0.8 (H) 0.0  - 0.5 K/uL   Basophils Relative 1 %   Basophils Absolute 0.1 0.0 - 0.1 K/uL   Immature Granulocytes 0 %   Abs Immature Granulocytes 0.01 0.00 - 0.07 K/uL  Comprehensive metabolic panel     Status: Abnormal   Collection Time: 04/22/20  5:40 PM  Result Value Ref Range   Sodium 138 135 - 145 mmol/L   Potassium 4.5 3.5 - 5.1 mmol/L   Chloride 97 (L) 98 - 111 mmol/L   CO2 31 22 - 32 mmol/L   Glucose, Bld 136 (H) 70 - 99 mg/dL   BUN 38 (H) 8 - 23 mg/dL   Creatinine, Ser 1.32 (H) 0.61 - 1.24 mg/dL   Calcium 9.0 8.9 - 10.3 mg/dL   Total Protein 7.4 6.5 - 8.1 g/dL   Albumin 3.5 3.5 - 5.0 g/dL   AST 22 15 - 41 U/L   ALT 18 0 - 44 U/L   Alkaline Phosphatase 71 38 - 126 U/L   Total Bilirubin 0.6 0.3 - 1.2 mg/dL  GFR, Estimated 58 (L) >60 mL/min   Anion gap 10 5 - 15  Lipase, blood     Status: None   Collection Time: 04/22/20  5:40 PM  Result Value Ref Range   Lipase 23 11 - 51 U/L  D-dimer, quantitative (not at [facility])     Status: None   Collection Time: 04/22/20  5:40 PM  Result Value Ref Range   D-Dimer, Quant 0.34 0.00 - 0.50 ug/mL-FEU  Troponin I (High Sensitivity)     Status: None   Collection Time: 04/22/20  8:09 PM  Result Value Ref Range   Troponin I (High Sensitivity) 3 <18 ng/L  Protime-INR     Status: Abnormal   Collection Time: 04/22/20  8:09 PM  Result Value Ref Range   Prothrombin Time 23.9 (H) 11.4 - 15.2 seconds   INR 2.2 (H) 0.8 - 1.2    MDM   PLAN: Patient pending lab work, CT and pelvis for evaluation of hepatobiliary etiology.  MDM: CBC shows no leukocytosis.  Hemoglobin stable at 13.8.  CMP shows BUN of 38, creatinine 1.32.  D-dimer is negative at 0.34.  Lipase is unremarkable.  Initial troponin is negative.  CT on pelvis shows the gallbladder is distended without evidence of acute cholecystitis.  No biliary ductal dilatation.  Reevaluation.  Patient still having pain.  Repeat abdominal exam showed he is tender in the right upper quadrant.  He has not  wanted to eat or drink anything.  We will plan to give him some additional analgesics and reassess.  Reevaluation.  Patient still having some pain.  It slightly improved after pain medication but still is tender in the right upper quadrant.  We will consult general surgeon to see if patient will need to be observed for formal ultrasound in the morning for concerns of possible hepatobiliary etiology.  Discussed patient with Dr. Britt Bolognese (Gen Surg).  He recommends giving 50 mg of Toradol given his kidney function to see if that helps with pain.  Patient still having pain and cannot tolerate p.o., he feels that it is reasonable for admission for observation with plans for general surgery to see him tomorrow and get a formal ultrasound.  If he is able to be controlled for pain, patient can follow-up on an outpatient basis.  RN informing that patient's O2 sat dropped to 87%.  He was placed on 2 L of oxygen which bumped him up.  He sometimes fluctuates between 88-92.  He does not have any oxygen requirement but does use a CPAP at night.  He does not feel like he is having any trouble breathing.  She told me this was before he was placed on the fentanyl.  Patient does not show any signs of fluid overload.  His chest x-ray does not show any evidence of pulmonary edema.  Repeat abdominal exam after Toradol.  Patient reports his pain is down to 1.  He appears much more comfortable.  We ambulated patient.  He maintained about 88%.  He reports that sometimes he goes down between 90-92%.  He states he did not have any symptoms while walking.  He denies any difficulty breathing.  He has been vaccinated for COVID and is got a booster.  No COVID symptoms that he knows of.  Patient has been able to tolerate p.o. without any difficulty.  He does report feeling better.  At this time, I suspect his symptoms were likely hepatobiliary related given that he is tender in the right upper quadrant.  His troponins are normal.  He has  atypical features of ACS etiology.  We discussed regarding admission versus discharge.  At this time, patient states he feels better.  We talked about doing an outpatient ultrasound tomorrow.  Unfortunately this facility at this time, we cannot get ultrasound in the emergency department.  We also discussed versus admission for pain control, ultrasound tomorrow morning.  We discussed essentially and patient states he feels comfortable with going home.  We will give him some short course of pain medication.  An ultrasound has been ordered for tomorrow. Discussed patient with Dr. Wallie Char who is agreeable to plan. Patient had ample opportunity for questions and discussion. All patient's questions were answered with full understanding. Strict return precautions discussed. Patient expresses understanding and agreement to plan.   RUQ abdominal pain  Nonspecific chest pain  Portions of this note were generated with Dragon dictation software. Dictation errors may occur despite best attempts at proofreading.      Volanda Napoleon, PA-C 04/23/20 0007    Fredia Sorrow, MD 05/03/20 [phone]

## 2020-04-22 NOTE — ED Notes (Addendum)
Pt was 88% while ambulating. Pt stated that he is normally around 92%. Pt placed back on 2l [place]. Did not complain about shortness of breath

## 2020-04-22 NOTE — ED Triage Notes (Signed)
Pt with right sided CP x 2 hours while watching the game today.  Pt states pain at times feels like it takes his breath away. + nausea off and on.  Denies emesis or diaphoresis.

## 2020-04-23 LAB — RESP PANEL BY RT-PCR (FLU A&B, COVID) ARPGX2
Influenza A by PCR: NEGATIVE
Influenza B by PCR: NEGATIVE
SARS Coronavirus 2 by RT PCR: NEGATIVE

## 2020-04-23 MED ORDER — HYDROCODONE-ACETAMINOPHEN 5-325 MG PO TABS: 1.0000 | ORAL_TABLET | Freq: Once | ORAL | Status: DC

## 2020-04-23 NOTE — Discharge Instructions (Signed)
your work-up today was reassuring.  You do have a COVID test pending.  As we discussed, you will need an ultrasound.  Your CT scan showed that your gallbladder was slightly distended and since you are having pain in that area, we will need further evaluation with an ultrasound.  I have arranged for an ultrasound.  Please call the main hospital tomorrow morning to return for an ultrasound.  I additionally provided you some pain medication.  Please follow-up with [facility].  Take pain medications as directed for break through pain. Do not drive or operate machinery while taking this medication.   As we discussed, if this is your gallbladder, you will need to decrease your amount of fatty, fried food.  This may worsen gallbladder issues.  Return emergency department for any difficulty breathing, vomiting, abdominal pain, chest pain or any other worsening concerning symptoms.

## 2020-04-24 ENCOUNTER — Ambulatory Visit: Payer: Medicare Other | Admitting: Physician Assistant

## 2020-04-24 DIAGNOSIS — Z8739 Personal history of other diseases of the musculoskeletal system and connective tissue: Secondary | ICD-10-CM

## 2020-04-24 DIAGNOSIS — Z87898 Personal history of other specified conditions: Secondary | ICD-10-CM

## 2020-04-24 DIAGNOSIS — G8929 Other chronic pain: Secondary | ICD-10-CM

## 2020-04-24 DIAGNOSIS — C632 Malignant neoplasm of scrotum: Secondary | ICD-10-CM

## 2020-04-24 DIAGNOSIS — Z8639 Personal history of other endocrine, nutritional and metabolic disease: Secondary | ICD-10-CM

## 2020-04-24 DIAGNOSIS — I499 Cardiac arrhythmia, unspecified: Secondary | ICD-10-CM

## 2020-04-24 DIAGNOSIS — M19041 Primary osteoarthritis, right hand: Secondary | ICD-10-CM

## 2020-04-24 DIAGNOSIS — Z8679 Personal history of other diseases of the circulatory system: Secondary | ICD-10-CM

## 2020-04-24 DIAGNOSIS — E790 Hyperuricemia without signs of inflammatory arthritis and tophaceous disease: Secondary | ICD-10-CM

## 2020-04-24 DIAGNOSIS — M5136 Other intervertebral disc degeneration, lumbar region: Secondary | ICD-10-CM

## 2020-04-24 DIAGNOSIS — Z8669 Personal history of other diseases of the nervous system and sense organs: Secondary | ICD-10-CM

## 2020-04-24 DIAGNOSIS — M7021 Olecranon bursitis, right elbow: Secondary | ICD-10-CM

## 2020-04-24 DIAGNOSIS — M170 Bilateral primary osteoarthritis of knee: Secondary | ICD-10-CM

## 2020-04-24 DIAGNOSIS — Z8659 Personal history of other mental and behavioral disorders: Secondary | ICD-10-CM

## 2020-04-24 NOTE — Progress Notes (Deleted)
Office Visit Note  Patient: Jesus Rodriguez             Date of Birth: 1950/07/20           MRN: [id number]             PCP: Patient, [name] Referring: No ref. provider found Visit Date: 05/08/2020 Occupation: @GUAROCC @  Subjective:    History of Present Illness: Jesus Rodriguez is a 70 y.o. male with history of osteoarthritis and DDD.   Activities of Daily Living:  Patient reports morning stiffness for *** {minute/hour:19697}.   Patient {ACTIONS;DENIES/REPORTS:21021675::"Denies"} nocturnal pain.  Difficulty dressing/grooming: {ACTIONS;DENIES/REPORTS:21021675::"Denies"} Difficulty climbing stairs: {ACTIONS;DENIES/REPORTS:21021675::"Denies"} Difficulty getting out of chair: {ACTIONS;DENIES/REPORTS:21021675::"Denies"} Difficulty using hands for taps, buttons, cutlery, and/or writing: {ACTIONS;DENIES/REPORTS:21021675::"Denies"}  No Rheumatology ROS completed.   PMFS History:  Patient Active Problem List   Diagnosis Date Noted  . Obesity, Class III, BMI 40-49.9 (morbid obesity) ([place])   . COPD with acute exacerbation ([place])   . Acute on chronic diastolic HF (heart failure) ([place])   . Pressure injury of skin 11/28/2019  . Acute respiratory failure with hypoxia ([place]) 11/27/2019  . Atrial fibrillation ([place]) 05/11/2018  . Encounter for therapeutic drug monitoring 04/22/2018  . Persistent atrial fibrillation ([place]) 04/08/2018  . History of rheumatoid arthritis seronegative 09/10/2017  . High risk medications (not anticoagulants) long-term use 02/14/2016  . Class 3 obesity in adult 02/14/2016  . Depression 02/14/2016  . Osteoarthritis of both hands 02/13/2016  . Osteoarthritis of both knees 02/13/2016  . DDD (degenerative disc disease), lumbar 02/13/2016  . 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 Hypertension 02/13/2016  . Elevated cholesterol 02/13/2016  . Insomnia 02/13/2016  . Anxiety 02/13/2016  . Sleep apnea 02/13/2016  . Arrhythmia 02/13/2016    Past Medical History:   Diagnosis Date  . Anxiety 02/13/2016  . Atrial flutter ([place]) 02/13/2016   s/p [facility]I ablation at [facility]  . DDD (degenerative disc disease), lumbar 02/13/2016  . Essential hypertension 02/13/2016  . Insomnia 02/13/2016  . Mixed hyperlipidemia 02/13/2016  . Osteoarthritis of both hands   . Persistent atrial fibrillation ([place])   . RA (rheumatoid arthritis) ([place]) 02/13/2016   Sero Negative  . 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 Sleep apnea 02/13/2016    Family History  Problem Relation Age of Onset  . Hypertension Father   . Heart attack Father   . Prostate cancer Father   . Hypertension Sister   . Hypertension Sister   . Hypertension Sister   . Epilepsy Son   . Diabetes Son    Past Surgical History:  Procedure Laterality Date  . A-FLUTTER ABLATION     at [facility] 2018  . CARDIOVERSION  02/10/2018  . CARDIOVERSION N/A 05/13/2018   Procedure: CARDIOVERSION;  Surgeon: Sanda Klein, MD;  Location: [facility] ENDOSCOPY;  Service: Cardiovascular;  Laterality: N/A;  . CATARACT EXTRACTION     Social History   Social History Narrative  . Not on file   Immunization History  Administered Date(s) Administered  . Fluad Quad(high Dose 65+) 01/13/2020  . Influenza, High Dose Seasonal PF 05/14/2018  . Influenza,inj,Quad PF,6+ Mos 01/04/2019     Objective: Vital Signs: There were no vitals taken for this visit.   Physical Exam   Musculoskeletal Exam: ***  CDAI Exam: CDAI Score: -- Patient Global: --; Provider Global: -- Swollen: --; Tender: -- Joint Exam 05/08/2020   No joint exam has been documented for this visit   There is currently no information documented on the homunculus.  Go to the Rheumatology activity and complete the homunculus joint exam.  Investigation: No additional findings.  Imaging: DG Chest 2 View  Result Date: 04/22/2020 CLINICAL DATA:  Right-sided chest pain over the last 2 hours. EXAM: CHEST - 2 VIEW COMPARISON:  11/30/2019 FINDINGS: Chronic elevation of the left  hemidiaphragm. Chronic cardiomegaly. Chronic interstitial lung markings in the mid and lower lungs. No sign of active infiltrate, mass, effusion or collapse. IMPRESSION: No active disease. Chronic cardiomegaly. Chronic interstitial lung markings. Chronic elevation of the left hemidiaphragm. Electronically Signed   By: Nelson Chimes M.D.   On: 04/22/2020 18:43   CT ABDOMEN PELVIS W CONTRAST  Result Date: 04/22/2020 CLINICAL DATA:  Abdominal pain.  Concern for biliary colic. EXAM: CT ABDOMEN AND PELVIS WITH CONTRAST TECHNIQUE: Multidetector CT imaging of the abdomen and pelvis was performed using the standard protocol following bolus administration of intravenous contrast. CONTRAST:  176mL OMNIPAQUE IOHEXOL 300 MG/ML  SOLN COMPARISON:  None. FINDINGS: Lower chest: There is atelectasis at the lung bases, left greater than right.The heart is enlarged. There are coronary artery calcifications. Hepatobiliary: Small hepatic cysts are noted. The gallbladder is distended without CT evidence for acute cholecystitis.There is no biliary ductal dilation. Pancreas: Normal contours without ductal dilatation. No peripancreatic fluid collection. Spleen: Unremarkable. Adrenals/Urinary Tract: --Adrenal glands: Unremarkable. --Right kidney/ureter: No hydronephrosis or radiopaque kidney stones. --Left kidney/ureter: No hydronephrosis or radiopaque kidney stones. --Urinary bladder: Unremarkable. Stomach/Bowel: --Stomach/Duodenum: No hiatal hernia or other gastric abnormality. Normal duodenal course and caliber. --Small bowel: Unremarkable. --Colon: Unremarkable. --Appendix: Normal. Vascular/Lymphatic: Atherosclerotic calcification is present within the non-aneurysmal abdominal aorta, without hemodynamically significant stenosis. --No retroperitoneal lymphadenopathy. --No mesenteric lymphadenopathy. --No pelvic or inguinal lymphadenopathy. Reproductive: Unremarkable Other: No ascites or free air. The abdominal wall is normal.  Musculoskeletal. No acute displaced fractures. IMPRESSION: 1. The gallbladder is distended without CT evidence for acute cholecystitis. There is no biliary ductal dilation. 2. Bibasilar atelectasis, left greater than right. Aortic Atherosclerosis (ICD10-I70.0). Electronically Signed   By: Constance Holster M.D.   On: 04/22/2020 20:03   US Abdomen Limited RUQ/Gall Gladder  Result Date: 05/02/2020 CLINICAL DATA:  Abdominal pain EXAM: ULTRASOUND ABDOMEN LIMITED RIGHT UPPER QUADRANT COMPARISON:  CT abdomen and pelvis 04/22/2020 FINDINGS: Gallbladder: Dependent nonshadowing echogenic material within the gallbladder favor sludge over tiny calculi. Mildly thickened gallbladder wall. No pericholecystic fluid or sonographic Murphy sign. Common bile duct: Diameter: 5 mm, normal Liver: Upper normal echogenicity. LEFT lobe cyst identified 2.6 x 1.4 x 2.7 cm. No additional masses or nodularity. No intrahepatic biliary dilatation. Portal vein is patent on color Doppler imaging with normal direction of blood flow towards the liver. Other: No RIGHT upper quadrant free fluid. IMPRESSION: LEFT lobe hepatic cyst 2.7 cm greatest size. Dependent nonshadowing echogenic material within gallbladder favor sludge, though tiny nonshadowing calculi within the sludge cannot be excluded. Mildly thickened gallbladder wall without pericholecystic fluid or sonographic Murphy sign. If there is persistent clinical concern for acute cholecystitis, consider radionuclide hepatobiliary imaging. No biliary dilatation. Electronically Signed   By: Lavonia Dana M.D.   On: 05/02/2020 12:54    Recent Labs: Lab Results  Component Value Date   WBC 8.8 04/22/2020   HGB 13.8 04/22/2020   PLT 202 04/22/2020   NA 138 04/22/2020   K 4.5 04/22/2020   CL 97 (L) 04/22/2020   CO2 31 04/22/2020   GLUCOSE 136 (H) 04/22/2020   BUN 38 (H) 04/22/2020   CREATININE 1.32 (H) 04/22/2020   BILITOT 0.6 04/22/2020  ALKPHOS 71 04/22/2020   AST 22 04/22/2020    ALT 18 04/22/2020   PROT 7.4 04/22/2020   ALBUMIN 3.5 04/22/2020   CALCIUM 9.0 04/22/2020   GFRAA >60 11/30/2019    Speciality Comments: No specialty comments available.  Procedures:  No procedures performed Allergies: Celebrex [celecoxib], Methotrexate derivatives, and Sulfa antibiotics   Assessment / Plan:     Visit Diagnoses: Primary osteoarthritis of both hands  Primary osteoarthritis of both knees  History of rheumatoid arthritis seronegative - In remission  Medication monitoring encounter - hydrocodone. patient received hydrocodone from another provider at [facility] ED on 04/22/2020.  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  No orders of the defined types were placed in this encounter.   Follow-Up Instructions: No follow-ups on file.   Ofilia Neas, PA-C  Note - This record has been created using Dragon software.  Chart creation errors have been sought, but may not always  have been located. Such creation errors do not reflect on  the standard of medical care.

## 2020-04-25 ENCOUNTER — Other Ambulatory Visit: Payer: Self-pay | Admitting: *Deleted

## 2020-04-25 ENCOUNTER — Telehealth: Payer: Self-pay

## 2020-04-25 NOTE — Telephone Encounter (Signed)
Patient left voicemail requesting prescription refill of Hydrocodone.

## 2020-04-25 NOTE — Patient Outreach (Signed)
[facility]) Care Management  04/25/2020  Cline Draheim 12/01/1950 [id number]  Telephone Assessment  Received a referral requested a call to a call-a-nurse hotline.   RN spoke with the pt and explained the purpose for today's call related to his call to the nurse hotline. Discussed pt's issues related to his urinary retention and limited output. Pt states he is not go to the ED as suggested but waiting a littler longer and he started urinating. Pt states his bladder is working and he is eliminating urine normally with no additional problems . RN confirmed all issues have resolved with no additional needs at this time. RN strongly encouraged the pt to contact his provider with any other concerns related to his urine eliminations in case further interventions are needed.   No additional needs at this time. Will close this case with no further needs.  Raina Mina, RN Care Management Coordinator [place] Office [phone]

## 2020-04-25 NOTE — Telephone Encounter (Signed)
Last Visit: 10/25/2019 Next Visit: 05/08/2020 UDS: 02/23/2020, Labs are consistent with medication use Narc Agreement: 02/23/2020  Last Fill: Hydrocodone was given to patient by [facility] on 04/22/2020 from ED visit for RUQ abdominal pain.   03/23/2020 by Dr. Bufford Buttner to refill Hydrocodone?

## 2020-04-25 NOTE — Telephone Encounter (Signed)
I called patient, patient said this is "stupid", patient hung the phone up on me

## 2020-04-25 NOTE — Telephone Encounter (Signed)
We would not be able to refill hydrocodone because he received hydrocodone from another provider without notifying us.

## 2020-05-02 ENCOUNTER — Ambulatory Visit (HOSPITAL_COMMUNITY)
Admission: RE | Admit: 2020-05-02 | Discharge: 2020-05-02 | Disposition: A | Discharge status: Home / Self Care | Payer: Medicare Other | Source: Ambulatory Visit | Attending: Emergency Medicine | Admitting: Emergency Medicine

## 2020-05-02 ENCOUNTER — Other Ambulatory Visit: Payer: Self-pay

## 2020-05-02 DIAGNOSIS — K7689 Other specified diseases of liver: Secondary | ICD-10-CM | POA: Diagnosis not present

## 2020-05-02 DIAGNOSIS — R1011 Right upper quadrant pain: Secondary | ICD-10-CM | POA: Insufficient documentation

## 2020-05-03 ENCOUNTER — Ambulatory Visit: Payer: Medicare Other | Admitting: General Surgery

## 2020-05-08 ENCOUNTER — Ambulatory Visit: Payer: Medicare Other | Admitting: Physician Assistant

## 2020-05-08 DIAGNOSIS — Z87898 Personal history of other specified conditions: Secondary | ICD-10-CM

## 2020-05-08 DIAGNOSIS — I499 Cardiac arrhythmia, unspecified: Secondary | ICD-10-CM

## 2020-05-08 DIAGNOSIS — E790 Hyperuricemia without signs of inflammatory arthritis and tophaceous disease: Secondary | ICD-10-CM

## 2020-05-08 DIAGNOSIS — M19041 Primary osteoarthritis, right hand: Secondary | ICD-10-CM

## 2020-05-08 DIAGNOSIS — M7021 Olecranon bursitis, right elbow: Secondary | ICD-10-CM

## 2020-05-08 DIAGNOSIS — Z8679 Personal history of other diseases of the circulatory system: Secondary | ICD-10-CM

## 2020-05-08 DIAGNOSIS — Z5181 Encounter for therapeutic drug level monitoring: Secondary | ICD-10-CM

## 2020-05-08 DIAGNOSIS — Z8739 Personal history of other diseases of the musculoskeletal system and connective tissue: Secondary | ICD-10-CM

## 2020-05-08 DIAGNOSIS — C632 Malignant neoplasm of scrotum: Secondary | ICD-10-CM

## 2020-05-08 DIAGNOSIS — Z8639 Personal history of other endocrine, nutritional and metabolic disease: Secondary | ICD-10-CM

## 2020-05-08 DIAGNOSIS — M170 Bilateral primary osteoarthritis of knee: Secondary | ICD-10-CM

## 2020-05-08 DIAGNOSIS — Z8659 Personal history of other mental and behavioral disorders: Secondary | ICD-10-CM

## 2020-05-08 DIAGNOSIS — M5136 Other intervertebral disc degeneration, lumbar region: Secondary | ICD-10-CM

## 2020-05-08 DIAGNOSIS — Z8669 Personal history of other diseases of the nervous system and sense organs: Secondary | ICD-10-CM

## 2020-05-29 ENCOUNTER — Ambulatory Visit (INDEPENDENT_AMBULATORY_CARE_PROVIDER_SITE_OTHER): Payer: Medicare Other | Admitting: *Deleted

## 2020-05-29 DIAGNOSIS — Z5181 Encounter for therapeutic drug level monitoring: Secondary | ICD-10-CM

## 2020-05-29 DIAGNOSIS — I4891 Unspecified atrial fibrillation: Secondary | ICD-10-CM | POA: Diagnosis not present

## 2020-05-29 LAB — POCT INR: INR: 2 (ref 2.0–3.0)

## 2020-05-29 NOTE — Patient Instructions (Signed)
Increase warfarin to 1 tablet daily except 1/2 tablet on Sundays and Thursdays Recheck INR in 6 weeks.

## 2020-05-31 ENCOUNTER — Other Ambulatory Visit: Payer: Self-pay | Admitting: Cardiology

## 2020-06-15 ENCOUNTER — Ambulatory Visit: Payer: Medicare Other | Admitting: Internal Medicine

## 2020-06-18 ENCOUNTER — Other Ambulatory Visit: Payer: Self-pay | Admitting: Cardiology

## 2020-06-27 ENCOUNTER — Other Ambulatory Visit: Payer: Self-pay | Admitting: Internal Medicine

## 2020-07-10 ENCOUNTER — Ambulatory Visit (INDEPENDENT_AMBULATORY_CARE_PROVIDER_SITE_OTHER): Payer: Medicare Other | Admitting: *Deleted

## 2020-07-10 DIAGNOSIS — I4891 Unspecified atrial fibrillation: Secondary | ICD-10-CM | POA: Diagnosis not present

## 2020-07-10 DIAGNOSIS — Z5181 Encounter for therapeutic drug level monitoring: Secondary | ICD-10-CM | POA: Diagnosis not present

## 2020-07-10 LAB — POCT INR: INR: 2.9 (ref 2.0–3.0)

## 2020-07-10 NOTE — Patient Instructions (Signed)
Continue warfarin 1 tablet daily except 1/2 tablet on Sundays and Thursdays Recheck INR in 6 weeks.

## 2020-07-13 ENCOUNTER — Ambulatory Visit (INDEPENDENT_AMBULATORY_CARE_PROVIDER_SITE_OTHER): Payer: Medicare Other | Admitting: Internal Medicine

## 2020-07-13 ENCOUNTER — Encounter: Payer: Self-pay | Admitting: Internal Medicine

## 2020-07-13 VITALS — BP 96/60 | HR 70 | Ht 73.0 in | Wt 371.0 lb

## 2020-07-13 DIAGNOSIS — I4811 Longstanding persistent atrial fibrillation: Secondary | ICD-10-CM

## 2020-07-13 DIAGNOSIS — Z9989 Dependence on other enabling machines and devices: Secondary | ICD-10-CM

## 2020-07-13 DIAGNOSIS — I4891 Unspecified atrial fibrillation: Secondary | ICD-10-CM | POA: Diagnosis not present

## 2020-07-13 DIAGNOSIS — G4733 Obstructive sleep apnea (adult) (pediatric): Secondary | ICD-10-CM

## 2020-07-13 DIAGNOSIS — I10 Essential (primary) hypertension: Secondary | ICD-10-CM

## 2020-07-13 DIAGNOSIS — D6869 Other thrombophilia: Secondary | ICD-10-CM

## 2020-07-13 MED ORDER — BENAZEPRIL HCL 10 MG PO TABS: 10.0000 mg | ORAL_TABLET | Freq: Every day | ORAL | 6 refills | Status: DC

## 2020-07-13 MED ORDER — TORSEMIDE 20 MG PO TABS: 60.0000 mg | ORAL_TABLET | Freq: Two times a day (BID) | ORAL | 6 refills | Status: DC

## 2020-07-13 NOTE — Progress Notes (Signed)
PCP: Patient, [name] (Inactive) Primary Cardiologist: Dr Harl Bowie Primary EP: Dr Rayann Heman  Raymond Bhardwaj is a 70 y.o. male who presents today for routine electrophysiology followup.  Since last being seen in our clinic, the patient reports doing reasonably well.  He is not very active. + SOB with heavy activity.  Today, he denies symptoms of palpitations, chest pain,  dizziness, presyncope, or syncope.  The patient is otherwise without complaint today.   Past Medical History:  Diagnosis Date  . Anxiety 02/13/2016  . Atrial flutter ([place]) 02/13/2016   s/p CTI ablation at [facility]  . DDD (degenerative disc disease), lumbar 02/13/2016  . Essential hypertension 02/13/2016  . Insomnia 02/13/2016  . Mixed hyperlipidemia 02/13/2016  . Osteoarthritis of both hands   . Persistent atrial fibrillation ([place])   . RA (rheumatoid arthritis) ([place]) 02/13/2016   Sero Negative  . 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 Sleep apnea 02/13/2016   Past Surgical History:  Procedure Laterality Date  . A-FLUTTER ABLATION     at [facility] 2018  . CARDIOVERSION  02/10/2018  . CARDIOVERSION N/A 05/13/2018   Procedure: CARDIOVERSION;  Surgeon: Sanda Klein, MD;  Location: [facility] END[facility];  Service: Cardiovascular;  Laterality: N/A;  . CATARACT EXTRACTION      ROS- all systems are reviewed and negatives except as per HPI above  Current Outpatient Medications  Medication Sig Dispense Refill  . acetaminophen (TYLENOL) 500 MG tablet Take 500 mg by mouth every 6 (six) hours as needed.    Marland Kitchen albuterol (VENTOLIN HFA) 108 (90 Base) MCG/ACT inhaler Inhale 1 puff into the lungs every 6 (six) hours as needed.    . benazepril (LOTENSIN) 20 MG tablet TAKE 1 TABLET BY MOUTH EVERY DAY 90 tablet 1  . doxycycline (VIBRAMYCIN) 100 MG capsule Take 100 mg by mouth daily.    Marland Kitchen ketoconazole (NIZORAL) 2 % cream Apply 1 application topically 2 (two) times daily as needed for irritation.    . Metoprolol Tartrate 75 MG TABS TAKE 1 TABLET TWICE A  DAY 180 tablet 2  . simvastatin (ZOCOR) 20 MG tablet TAKE 1 TABLET (20 MG TOTAL) BY MOUTH DAILY AT 6 PM 90 tablet 1  . tamsulosin (FLOMAX) 0.4 MG CAPS capsule Take by mouth daily.    Marland Kitchen torsemide (DEMADEX) 20 MG tablet Take 60 mg by mouth 2 (two) times daily.    . TRELEGY ELLIPTA 100-62.5-25 MCG/INH AEPB Inhale 1 puff into the lungs daily.    Marland Kitchen triamcinolone (KENALOG) 0.1 % Apply 1 application topically 3 (three) times daily.    Marland Kitchen warfarin (COUMADIN) 10 MG tablet TAKE 1/2 TO 1 TABLET DAILY AS DIRECTED BY COUMADIN CLINIC. 90 tablet 1   No current facility-administered medications for this visit.    Physical Exam: Vitals:   07/13/20 1137  Height: 6\' 1"  (1.854 m)    GEN- The patient is morbidly obese, appearing, alert and oriented x 3 today.   Head- normocephalic, atraumatic Eyes-  Sclera clear, conjunctiva pink Ears- hearing intact Oropharynx- clear Lungs-   normal work of breathing Heart- irregular rate and rhythm  GI- soft, NT, ND, + BS Extremities- no clubbing, cyanosis, + edema  Wt Readings from Last 3 Encounters:  04/22/20 (!) 374 lb (169.6 kg)  04/19/20 (!) 375 lb (170.1 kg)  01/13/20 (!) 386 lb 9.6 oz (175.4 kg)    EKG tracing ordered today is personally reviewed and shows afib, rate controlled  Assessment and Plan:  1. Longstanding persistent afib/ atrial flutter Rate control  is planned long term chads2vasc score is 2.  He is on coumadin  2. HTN BP is recently low Reduce benazapril to 10mg  daily  3. Morbid obesity Body mass index is 49.34 kg/m. Lifestyle modification is advised  4. OSA Compliance with CPAP advised  5. Chronic diastolic dysfunction Stable No change required today   Risks, benefits and potential toxicities for medications prescribed and/or refilled reviewed with patient today.   Follow-up with Dr Harl Bowie going forward I will see as needed  Thompson Grayer MD, [facility] 07/13/2020 11:40 AM

## 2020-07-13 NOTE — Addendum Note (Signed)
Addended by: Laurine Blazer on: 07/13/2020 12:10 PM   Modules accepted: Orders

## 2020-07-13 NOTE — Patient Instructions (Addendum)
Medication Instructions:   Decrease Benazepril to 10mg  daily.  Continue all other medications.    Labwork: none  Testing/Procedures: none  Follow-Up: As needed   Any Other Special Instructions Will Be Listed Below (If Applicable).  If you need a refill on your cardiac medications before your next appointment, please call your pharmacy.

## 2020-07-13 NOTE — Addendum Note (Signed)
Addended by: Laurine Blazer on: 07/13/2020 12:20 PM   Modules accepted: Orders

## 2020-08-22 ENCOUNTER — Telehealth: Payer: Self-pay | Admitting: Cardiology

## 2020-08-22 ENCOUNTER — Ambulatory Visit (INDEPENDENT_AMBULATORY_CARE_PROVIDER_SITE_OTHER): Payer: Medicare Other | Admitting: *Deleted

## 2020-08-22 DIAGNOSIS — I4891 Unspecified atrial fibrillation: Secondary | ICD-10-CM | POA: Diagnosis not present

## 2020-08-22 DIAGNOSIS — Z5181 Encounter for therapeutic drug level monitoring: Secondary | ICD-10-CM | POA: Diagnosis not present

## 2020-08-22 LAB — POCT INR: INR: 3 (ref 2.0–3.0)

## 2020-08-22 NOTE — Patient Instructions (Signed)
Continue warfarin 1 tablet daily except 1/2 tablet on Sundays and Thursdays Recheck INR in 6 weeks.

## 2020-08-22 NOTE — Telephone Encounter (Signed)
Dropped off paper work for jury duty and is needing a letter from Dr. Harl Bowie

## 2020-08-22 NOTE — Telephone Encounter (Signed)
Form placed in basket of Branch's desk.

## 2020-08-23 ENCOUNTER — Encounter: Payer: Self-pay | Admitting: *Deleted

## 2020-08-23 NOTE — Telephone Encounter (Signed)
Letter created and pt aware to pick up in [place] office

## 2020-08-23 NOTE — Telephone Encounter (Signed)
I have sent a letter to [facility] to provide to him    Zandra Abts MD

## 2020-09-21 ENCOUNTER — Other Ambulatory Visit: Payer: Self-pay | Admitting: Cardiology

## 2020-10-03 ENCOUNTER — Ambulatory Visit: Payer: Medicare Other | Admitting: *Deleted

## 2020-10-03 ENCOUNTER — Other Ambulatory Visit: Payer: Self-pay

## 2020-10-03 DIAGNOSIS — I4891 Unspecified atrial fibrillation: Secondary | ICD-10-CM

## 2020-10-03 DIAGNOSIS — Z5181 Encounter for therapeutic drug level monitoring: Secondary | ICD-10-CM

## 2020-10-03 LAB — POCT INR: INR: 2.7 (ref 2.0–3.0)

## 2020-10-03 NOTE — Patient Instructions (Signed)
Continue warfarin 1 tablet daily except 1/2 tablet on Sundays and Thursdays Recheck INR in 6 weeks.

## 2020-10-15 NOTE — Progress Notes (Deleted)
Cardiology Office Note  Date: 10/16/2020   ID: Jesus Rodriguez, DOB [birth year], MRN [id number]  PCP:  Patient, [name] (Inactive)  Cardiologist:  Carlyle Dolly, MD Electrophysiologist:  Thompson Grayer, MD   Chief Complaint: Follow-up atrial fibrillation/atrial flutter, chronic diastolic heart failure, COPD, OSA  History of Present Illness: Jesus Rodriguez is a 70 y.o. male with a history of atrial fibrillation/flutter, chronic diastolic heart failure, COPD, OSA on CPAP.  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  Previously followed by [facility].  Failed sotalol, started on dofetilide, followed by Dr. Rayann Heman at [facility] some recent palpitations, heart rates in the 60s and 70s.  Chronic diastolic heart failure.  11/01/2018 echo showed LVEF of 55 to 60%.  Phone call end of April concerning increased edema and shortness of breath.  Torsemide was increased to 60 mg p.o. twice daily.    Last saw Dr. Harl Bowie 11/22/2019 via telemedicine visit. He had some recent palpitations and heart rates were in the 60s and 70s.  Office weight on 09/06/2019 was 396 pounds.  Home weight was 388-393 pounds. Being followed by pulmonary in [place] for COPD and OSA.  Recent presentation to the emergency room 11/27/2019 with complaints of weakness and progressively worsening shortness of breath. Increasing difficulty breathing even walking around his house.  He admitted to having difficulty breathing for 2 years but had gotten progressively worse over the previous months much worse over the last 2 to 4 months.  He was admitted with acute on chronic respiratory failure with hypoxia in the setting of acute on chronic diastolic CHF exacerbation, COPD exacerbation and persistent atrial fibrillation.  He was hypokalemic with a potassium of 2.8.  BNP was 296.  Troponins were 63, 45, 39.  Patient was diuresed with IV diuretics and discharged on torsemide 40 mg p.o. twice daily.  Patient was given steroids and oral  antibiotics for COPD exacerbation and bronchiectasis.  He was to resume bronchodilator regimen and oxygen at home.  He was discharged on 11/30/2019   Last visit for  hospital follow-up.  He stated he was feeling a little better but was having some issues with nervousness.  Stated he was started on prednisone in the hospital along with antibiotics for his respiratory infection and believes the prednisone is making him nervous..  He stated his breathing was better but he has chronic dyspnea.  He was on 2 L nasal O2 continuously. He was using his CPAP at home.  Significant history of morbid obesity with likely hypoventilation syndrome.  Stated he had not had a bowel movement in approximately 8 to 9 days since being discharged from the hospital.  He stated he had no PCP because his PCP had recently retired.  He denied any anginal symptoms, palpitations or arrhythmias, orthostatic symptoms, bleeding in stool or urine, claudication, DVT or PE-like symptoms, or lower extremity edema.  He did have orthopnea.  He was sleeping on an incline while using his CPAP.  He  lost approximately 4 pounds since August 10. Weight  in clinic was 393 pounds.  Recent encounter with Dr. Rayann Heman via telemedicine visit for persistent atrial fibrillation.  His Tikosyn had been discontinued because he was in atrial fibrillation.  He was continuing to have shortness of breath.  Having rare palpitations.  Positive for LE edema.  Rate control was advised.  Metoprolol increased to 75 mg p.o. twice daily.  Lifestyle modification advised for morbid obesity.  Hypertension was stable and no changes were made.  Advised to wear his CPAP for OSA.  He was to continue oral diuretics.  Sodium restriction was advised.  Screened for Alleviate HF.   He saw Dr. Rayann Heman on 07/13/2020 for routine electrophysiology follow-up.  He reported doing reasonably well.  He was not very active.  Reported positive shortness of breath with heavy activity.  He was otherwise  without complaint.  His CHA2DS2-VASc score was 2.  He was on Coumadin.  Rate control was planned long-term.  Blood pressure was low and benazepril was decreased to 10 mg daily.  He was advised on lifestyle modification for morbid obesity.  Compliance was advised with CPAP for OSA.  Chronic diastolic dysfunction was stable with no changes.     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     albuterol (VENTOLIN HFA) 108 (90 Base) MCG/ACT inhaler Inhale 1 puff into the lungs every 6 (six) hours as needed.     benazepril (LOTENSIN) 10 MG tablet Take 1 tablet (10 mg total) by mouth daily. 30 tablet 6   doxycycline (VIBRAMYCIN) 100 MG capsule Take 100 mg by mouth daily.     ketoconazole (NIZORAL) 2 % cream Apply 1 application topically 2 (two) times daily as needed for irritation.     Metoprolol Tartrate 75 MG TABS TAKE 1 TABLET TWICE A DAY 180 tablet 2   simvastatin (ZOCOR) 20 MG tablet TAKE 1 TABLET BY MOUTH DAILY AT 6 PM. 90 tablet 1   tamsulosin (FLOMAX) 0.4 MG CAPS capsule Take by mouth daily.     torsemide (DEMADEX) 20 MG  tablet Take 3 tablets (60 mg total) by mouth 2 (two) times daily. 180 tablet 6   TRELEGY ELLIPTA 100-62.5-25 MCG/INH AEPB Inhale 1 puff into the lungs daily.     triamcinolone (KENALOG) 0.1 % Apply 1 application topically 3 (three) times daily.     warfarin (COUMADIN) 10 MG tablet TAKE 1/2 TO 1 TABLET DAILY AS DIRECTED BY COUMADIN CLINIC. 90 tablet 1   No current facility-administered medications for this visit.   Allergies:  Celebrex [celecoxib], Methotrexate derivatives, and Sulfa antibiotics   Social History: The patient  reports that he has been smoking cigarettes. He started smoking about 49 years ago. He has a 70.00 pack-year smoking history. He has never used smokeless tobacco. He reports that he does not drink alcohol and does not use drugs.   Family History: The patient's family history includes Diabetes in his son; Epilepsy in his son; Heart attack in his father; Hypertension in his father, sister, sister, and sister; Prostate cancer in his father.  ROS:  Please see the history of present illness. Otherwise, complete review of systems is positive for none.  All other systems are reviewed and negative.   Physical Exam: VS:  There were no vitals taken for this visit., BMI There is no height or weight on file to calculate BMI.  Wt Readings from Last 3 Encounters:  07/13/20 (!) 371 lb (168.3 kg)  04/22/20 (!) 374 lb (169.6 kg)  04/19/20 (!) 375 lb (170.1 kg)    General: Severe morbid obese patient appears comfortable at rest. Neck: Supple, no elevated JVP or carotid bruits, no thyromegaly. Lungs: Clear to auscultation, prolonged expiratory phase, nonlabored breathing at rest. Cardiac: Irregularly irregular rate and rhythm, no S3 or significant systolic murmur, no pericardial rub. Abdomen: Soft, nontender, no hepatomegaly, bowel sounds present, no guarding or rebound. Extremities: No pitting edema, distal pulses 2+. Skin: Warm and dry. Musculoskeletal: No  kyphosis. Neuropsychiatric: Alert and oriented x3, affect grossly appropriate.  ECG:  EKG November 29, 2019 atrial fibrillation with heart rate of 91.  ST and T wave abnormality consider anterolateral ischemia.  Recent Labwork: 11/27/2019: B Natriuretic Peptide 323.0 11/30/2019: Magnesium 2.5 04/22/2020: ALT 18; AST 22; BUN 38; Creatinine, Ser 1.32; Hemoglobin 13.8; Platelets 202; Potassium 4.5; Sodium 138  No results found for: CHOL, TRIG, HDL, CHOLHDL, VLDL, LDLCALC, LDLDIRECT  Other Studies Reviewed Today:   10/2018 echo  1. Images are limited.  2. The left ventricle has grossly normal systolic function, with an ejection fraction of 55-60%. The cavity size was normal. There is mildly increased left ventricular wall thickness. Left ventricular diastolic Doppler parameters are indeterminate.  3. The right ventricle has normal systolic function. The cavity was normal. There is no increase in right ventricular wall thickness. Right ventricular systolic pressure is normal with an estimated pressure of 12.5 mmHg.  4. The aortic valve is tricuspid. Mild calcification of the aortic valve. Mild aortic annular calcification noted.  5. The mitral valve is grossly normal. There is mild mitral annular calcification present.  6. The tricuspid valve is grossly normal.  7. The aorta is abnormal in size and structure.  8. There is mild dilatation of the aortic root.  9. The interatrial septum was not well visualized.  Assessment and Plan:   1. Atrial fibrillation, unspecified type (HCC) Pulses irregularly irregular at a rate of 93.  Patient was discontinued on dofetilide while inpatient recently.  At recent visit with Dr. Rayann Heman his metoprolol was increased to 75 mg daily.  He is tolerating the dose well with atrial fibrillation rate controlled.  Continue Coumadin 1/2 to 1 tablet daily as directed by Coumadin clinic.  2. Acute on chronic diastolic heart failure (HCC) Continue torsemide 60 mg p.o. twice  daily.  His weight is down about 11 pounds since last visit.  States he is feeling much better and denies any significant shortness of breath, or increased lower extremity edema.  Advised the patient to continue to weigh daily and call if weight gain of 3 pounds in 24 hours or 5 pounds in 1 week.  Restrict fluids to approximately 60 ounces per day. He continues to adhere to restrictions.  3. COPD with acute exacerbation ([place]) Significant history of smoking approximately 2 packs/day until he recently became ill and was admitted.  He states they took my cigarettes away.  Has not smoked since hospital discharge.  He is on continuous O2 at home 2 L.  At last visit  referral was made to pulmonary for COPD evaluation and  possible PFTs.  However he is seeing Dr. Linde Gillis pulmonologist in [place].  States he recently had 2 new inhalers prescribed by Dr. Koleen Nimrod.  4. OSA (obstructive sleep apnea) Patient is on CPAP at home and compliant per his statement.  Patient states he is pending receiving a new CPAP machine.  5. Obesity, Class III, BMI 40-49.9 (morbid obesity) (HCC) Significant obesity with associated OSA on CPAP and likely hypoventilation syndrome combined with COPD from smoking.  Advised weight loss   Medication Adjustments/Labs and Tests Ordered: Current medicines are reviewed at length with the patient today.  Concerns regarding medicines are outlined above.   Disposition: Follow-up with Dr. Harl Bowie or APP 6 months  signed, Levell July, NP 10/16/2020 12:03 AM    [facility] [place], [place], [place] [address] Phone: [phone]; Fax: [phone]

## 2020-10-16 ENCOUNTER — Ambulatory Visit: Payer: Medicare Other | Admitting: Family Medicine

## 2020-11-14 NOTE — Progress Notes (Signed)
Cardiology Office Note  Date: 11/15/2020   ID: Jesus Rodriguez, DOB [birth year], MRN [id number]  PCP:  Patient, [name] (Inactive)  Cardiologist:  Jesus Dolly, MD Electrophysiologist:  Jesus Grayer, MD   Chief Complaint:  6 month follow-up    History of Present Illness: Jesus Rodriguez is a 70 y.o. male with a history of atrial fibrillation/flutter, chronic diastolic heart failure, COPD, OSA on CPAP.  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  Previously followed by [facility].  Failed sotalol, started on dofetilide, followed by Dr. Rayann Rodriguez at [facility] some recent palpitations, heart rates in the 60s and 70s.  Chronic diastolic heart failure.  11/01/2018 echo showed LVEF of 55 to 60%.  Phone call end of April concerning increased edema and shortness of breath.  Torsemide was increased to 60 mg p.o. twice daily.    Previously saw Dr. Harl Rodriguez 11/22/2019 via telemedicine visit. He had some recent palpitations and heart rates were in the 60s and 70s.  Office weight on 09/06/2019 was 396 pounds.  Home weight was 388-393 pounds. Being followed by pulmonary in [place] for COPD and OSA.   He saw Dr. Rayann Rodriguez on 07/13/2020 for routine electrophysiology follow-up.  He reported doing reasonably well.  He was not very active.  Reported positive shortness of breath with heavy activity.  He was otherwise without complaint.  His CHA2DS2-VASc score was 2.  He was on Coumadin.  Rate control was planned long-term.  Blood pressure was low and benazepril was decreased to 10 mg daily.  He was advised on lifestyle modification for morbid obesity.  Compliance was advised with CPAP for OSA.  Chronic diastolic dysfunction was stable with no changes.  He is here today for 47-monthfollow-up.  He denies any acute illnesses or hospitalizations in the interim since last visit.  He denies any heart failure-like symptoms i.e. weight gain, shortness of breath, DOE, PND, orthopnea, lower extremity edema.  He is 6 pounds  down from last measurement on 07/13/2020 on today's weight.  He denies any orthostatic symptoms, CVA or TIA-like symptoms, bleeding issues, denies any palpitations.  He and Dr. ARayann Hemanhave agreed on rate control strategy for his atrial fibrillation since his Tikosyn was not maintaining him in sinus rhythm..  He states he is compliant with his CPAP.  States he would like to lose some weight but has not been able.  States he is eating a little better since his daughter moved in with him and she is cooking some of the meals.     Past Medical History:  Diagnosis Date   Anxiety 02/13/2016   [place] 02[place]   [facility]  DDD (degenerative disc disease), lumbar 02/13/2016   Essential hypertension 02/13/2016   Insomnia 02/13/2016   Mixed hyperlipidemia 02/13/2016   Osteoarthritis of both hands    Persistent atrial fibrillation (HCC)    [place] (rheumatoid [place]) ([place] 02/13/2016   Sero Negative   [place] 02/[place]   Sleep apnea 02/13/2016    Past Surgical History:  Procedure Laterality Date   A-FLUTTER ABLATION     at [facility] 2018   CARDIOVERSION  02/10/2018   CARDIOVERSION N/A 05/13/2018   Procedure: CARDIOVERSION;  Surgeon: CSanda Klein MD;  Location: [facility];  Service: Cardiovascular;  Laterality: N/A;   CATARACT EXTRACTION      Current Outpatient Medications  Medication Sig Dispense Refill   acetaminophen (TYLENOL) 500 MG tablet Take 500 mg by mouth every 6 (six)  hours as needed.     albuterol (VENTOLIN HFA) 108 (90 Base) MCG/ACT inhaler Inhale 1 puff into the lungs every 6 (six) hours as needed.     benazepril (LOTENSIN) 10 MG tablet Take 1 tablet (10 mg total) by mouth daily. 30 tablet 6   doxycycline (VIBRAMYCIN) 100 MG capsule Take 100 mg by mouth daily.     ketoconazole (NIZORAL) 2 % cream Apply 1 application topically 2 (two) times daily as needed for irritation.     Metoprolol Tartrate 75 MG TABS TAKE 1 TABLET TWICE A DAY 180 tablet 2    simvastatin (ZOCOR) 20 MG tablet TAKE 1 TABLET BY MOUTH DAILY AT 6 PM. 90 tablet 1   tamsulosin (FLOMAX) 0.4 MG CAPS capsule Take by mouth daily.     torsemide (DEMADEX) 20 MG tablet Take 3 tablets (60 mg total) by mouth 2 (two) times daily. 180 tablet 6   TRELEGY ELLIPTA 100-62.5-25 MCG/INH AEPB Inhale 1 puff into the lungs daily.     triamcinolone (KENALOG) 0.1 % Apply 1 application topically 3 (three) times daily.     warfarin (COUMADIN) 10 MG tablet TAKE 1/2 TO 1 TABLET DAILY AS DIRECTED BY COUMADIN CLINIC. 90 tablet 1   No current facility-administered medications for this visit.   Allergies:  Celebrex [celecoxib], Methotrexate derivatives, and Sulfa antibiotics   Social History: The patient  reports that he has been smoking cigarettes. He started smoking about 49 years ago. He has a 70.00 pack-year smoking history. He has never used smokeless tobacco. He reports that he does not drink alcohol and does not use drugs.   Family History: The patient's family history includes Diabetes in his son; Epilepsy in his son; Heart attack in his father; Hypertension in his father, sister, sister, and sister; Prostate cancer in his father.   ROS:  Please see the history of present illness. Otherwise, complete review of systems is positive for none.  All other systems are reviewed and negative.   Physical Exam: VS:  BP 100/60   Pulse 74   Ht '6\' 1"'$  (1.854 m)   Wt (!) 365 lb 6.4 oz (165.7 kg)   SpO2 93%   BMI 48.21 kg/m , BMI Body mass index is 48.21 kg/m.  Wt Readings from Last 3 Encounters:  11/15/20 (!) 365 lb 6.4 oz (165.7 kg)  07/13/20 (!) 371 lb (168.3 kg)  04/22/20 (!) 374 lb (169.6 kg)    General: Severe morbid obese patient appears comfortable at rest. Neck: Supple, no elevated JVP or carotid bruits, no thyromegaly. Lungs: Clear to auscultation, prolonged expiratory phase, nonlabored breathing at rest. Cardiac: Irregularly irregular rate and rhythm, no S3 or significant systolic  murmur, no pericardial rub. Abdomen: Soft, nontender, no hepatomegaly, bowel sounds present, no guarding or rebound. Extremities: No pitting edema, distal pulses 2+. Skin: Warm and dry. Musculoskeletal: No kyphosis. Neuropsychiatric: Alert and oriented x3, affect grossly appropriate.  ECG:  EKG November 29, 2019 atrial fibrillation with heart rate of 91.  ST and T wave abnormality consider anterolateral ischemia.  Recent Labwork: 11/27/2019: B Natriuretic Peptide 323.0 11/30/2019: Magnesium 2.5 04/22/2020: ALT 18; AST 22; BUN 38; Creatinine, Ser 1.32; Hemoglobin 13.8; Platelets 202; Potassium 4.5; Sodium 138  No results found for: CHOL, TRIG, HDL, CHOLHDL, VLDL, LDLCALC, LDLDIRECT  Other Studies Reviewed Today:   10/2018 echo  1. Images are limited.  2. The left ventricle has grossly normal systolic function, with an ejection fraction of 55-60%. The cavity size was normal. There is mildly  increased left ventricular wall thickness. Left ventricular diastolic Doppler parameters are indeterminate.  3. The right ventricle has normal systolic function. The cavity was normal. There is no increase in right ventricular wall thickness. Right ventricular systolic pressure is normal with an estimated pressure of 12.5 mmHg.  4. The aortic valve is tricuspid. Mild calcification of the aortic valve. Mild aortic annular calcification noted.  5. The mitral valve is grossly normal. There is mild mitral annular calcification present.  6. The tricuspid valve is grossly normal.  7. The aorta is abnormal in size and structure.  8. There is mild dilatation of the aortic root.  9. The interatrial septum was not well visualized.  Assessment and Plan:   1. Atrial fibrillation, unspecified type (HCC) Pulses irregularly irregular at a rate of 74.  His Tikosyn had been discontinued and plan was for rate control.  At recent visit with Dr. Rayann Rodriguez his metoprolol was increased to 75 mg daily.  He is tolerating the dose  well with atrial fibrillation rate controlled.  Continue Coumadin 1/2 to 1 tablet daily as directed by Coumadin clinic.  2. Acute on chronic diastolic heart failure (HCC) Continue torsemide 60 mg p.o. twice daily.  His weight today is 365.  He is down about 6 pounds from last weight on 07/13/2020.  States he is feeling much better and denies any significant shortness of breath, or increased lower extremity edema.  Advised the patient to continue to weigh daily and call if weight gain of 3 pounds in 24 hours or 5 pounds in 1 week.  Restrict fluids to approximately 60 ounces per day. He continues to adhere to restrictions.  3. COPD with acute exacerbation ([place]) At last visit he had stopped smoking.  He is on continuous O2 at home 2 L.  At last visit  referral was made to pulmonary for COPD evaluation and possible PFTs.  He was seeing Dr. Linde Gillis pulmonologist in [place].  He recently had 2 new inhalers prescribed by Dr. Koleen Nimrod.  4. OSA (obstructive sleep apnea) Patient is on CPAP at home and compliant per his statement.  Patient has received his new machine and states he is very compliant with his CPAP therapy.  He states he even wears it when he takes naps during the day.  5. Obesity, Class III, BMI 40-49.9 (morbid obesity) (HCC) Significant obesity with associated OSA on CPAP and likely hypoventilation syndrome combined with COPD from smoking.  Advised weight loss.  He states he would like to lose weight but has problems being successful at weight loss.  He has lost approximately 6 pounds since last visit.  Encourage more activity.  He states he is eating better now since his daughter moved in and is cooking him healthy foods   Medication Adjustments/Labs and Tests Ordered: Current medicines are reviewed at length with the patient today.  Concerns regarding medicines are outlined above.   Disposition: Follow-up with Dr. Harl Rodriguez or APP 6 months  signed, Levell July,  NP 11/15/2020 10:44 AM    [facility] [place], [place], [place] [address] Phone: [phone]; Fax: [phone]

## 2020-11-15 ENCOUNTER — Ambulatory Visit: Payer: Medicare Other | Admitting: Family Medicine

## 2020-11-15 ENCOUNTER — Encounter: Payer: Self-pay | Admitting: Family Medicine

## 2020-11-15 ENCOUNTER — Ambulatory Visit (INDEPENDENT_AMBULATORY_CARE_PROVIDER_SITE_OTHER): Payer: Medicare Other | Admitting: *Deleted

## 2020-11-15 VITALS — BP 100/60 | HR 74 | Ht 73.0 in | Wt 365.4 lb

## 2020-11-15 DIAGNOSIS — I5033 Acute on chronic diastolic (congestive) heart failure: Secondary | ICD-10-CM | POA: Diagnosis not present

## 2020-11-15 DIAGNOSIS — G4733 Obstructive sleep apnea (adult) (pediatric): Secondary | ICD-10-CM | POA: Diagnosis not present

## 2020-11-15 DIAGNOSIS — Z5181 Encounter for therapeutic drug level monitoring: Secondary | ICD-10-CM | POA: Diagnosis not present

## 2020-11-15 DIAGNOSIS — I4891 Unspecified atrial fibrillation: Secondary | ICD-10-CM | POA: Diagnosis not present

## 2020-11-15 DIAGNOSIS — J441 Chronic obstructive pulmonary disease with (acute) exacerbation: Secondary | ICD-10-CM

## 2020-11-15 DIAGNOSIS — Z9989 Dependence on other enabling machines and devices: Secondary | ICD-10-CM

## 2020-11-15 LAB — POCT INR: INR: 3.5 — AB (ref 2.0–3.0)

## 2020-11-15 NOTE — Patient Instructions (Signed)
Hold warfarin tonight then resume 1 tablet daily except 1/2 tablet on Sundays and Thursdays Recheck INR in 6 weeks.

## 2020-11-15 NOTE — Patient Instructions (Addendum)
Medication Instructions:  Your physician recommends that you continue on your current medications as directed. Please refer to the Current Medication list given to you today.  Labwork: none  Testing/Procedures: none  Follow-Up: Your physician recommends that you schedule a follow-up appointment in: 6 month  Any Other Special Instructions Will Be Listed Below (If Applicable).  If you need a refill on your cardiac medications before your next appointment, please call your pharmacy. 

## 2020-11-20 ENCOUNTER — Encounter (HOSPITAL_COMMUNITY): Payer: Self-pay | Admitting: *Deleted

## 2020-11-20 ENCOUNTER — Inpatient Hospital Stay (HOSPITAL_COMMUNITY)
Admission: EM | Admit: 2020-11-20 | Discharge: 2020-11-27 | DRG: 388 | Disposition: A | Discharge status: Home Health | Payer: Medicare Other | Attending: Internal Medicine | Admitting: Internal Medicine

## 2020-11-20 ENCOUNTER — Other Ambulatory Visit: Payer: Self-pay

## 2020-11-20 ENCOUNTER — Emergency Department (HOSPITAL_COMMUNITY): Payer: Medicare Other

## 2020-11-20 DIAGNOSIS — N170 Acute kidney failure with tubular necrosis: Secondary | ICD-10-CM | POA: Diagnosis not present

## 2020-11-20 DIAGNOSIS — E441 Mild protein-calorie malnutrition: Secondary | ICD-10-CM | POA: Diagnosis present

## 2020-11-20 DIAGNOSIS — F419 Anxiety disorder, unspecified: Secondary | ICD-10-CM | POA: Diagnosis present

## 2020-11-20 DIAGNOSIS — K56609 Unspecified intestinal obstruction, unspecified as to partial versus complete obstruction: Principal | ICD-10-CM

## 2020-11-20 DIAGNOSIS — Z0189 Encounter for other specified special examinations: Secondary | ICD-10-CM

## 2020-11-20 DIAGNOSIS — F17200 Nicotine dependence, unspecified, uncomplicated: Secondary | ICD-10-CM

## 2020-11-20 DIAGNOSIS — Z833 Family history of diabetes mellitus: Secondary | ICD-10-CM

## 2020-11-20 DIAGNOSIS — K56600 Partial intestinal obstruction, unspecified as to cause: Secondary | ICD-10-CM | POA: Diagnosis not present

## 2020-11-20 DIAGNOSIS — M19041 Primary osteoarthritis, right hand: Secondary | ICD-10-CM | POA: Diagnosis present

## 2020-11-20 DIAGNOSIS — Z6841 Body Mass Index (BMI) 40.0 and over, adult: Secondary | ICD-10-CM

## 2020-11-20 DIAGNOSIS — Z9981 Dependence on supplemental oxygen: Secondary | ICD-10-CM

## 2020-11-20 DIAGNOSIS — J9611 Chronic respiratory failure with hypoxia: Secondary | ICD-10-CM | POA: Diagnosis present

## 2020-11-20 DIAGNOSIS — Z95828 Presence of other vascular implants and grafts: Secondary | ICD-10-CM

## 2020-11-20 DIAGNOSIS — Z8547 Personal history of malignant neoplasm of testis: Secondary | ICD-10-CM

## 2020-11-20 DIAGNOSIS — Z8249 Family history of ischemic heart disease and other diseases of the circulatory system: Secondary | ICD-10-CM

## 2020-11-20 DIAGNOSIS — K567 Ileus, unspecified: Secondary | ICD-10-CM | POA: Diagnosis present

## 2020-11-20 DIAGNOSIS — I959 Hypotension, unspecified: Secondary | ICD-10-CM | POA: Diagnosis not present

## 2020-11-20 DIAGNOSIS — E869 Volume depletion, unspecified: Secondary | ICD-10-CM | POA: Diagnosis not present

## 2020-11-20 DIAGNOSIS — I4892 Unspecified atrial flutter: Secondary | ICD-10-CM | POA: Diagnosis present

## 2020-11-20 DIAGNOSIS — E871 Hypo-osmolality and hyponatremia: Secondary | ICD-10-CM | POA: Diagnosis present

## 2020-11-20 DIAGNOSIS — E876 Hypokalemia: Secondary | ICD-10-CM | POA: Diagnosis present

## 2020-11-20 DIAGNOSIS — N179 Acute kidney failure, unspecified: Secondary | ICD-10-CM

## 2020-11-20 DIAGNOSIS — I5032 Chronic diastolic (congestive) heart failure: Secondary | ICD-10-CM | POA: Diagnosis present

## 2020-11-20 DIAGNOSIS — G4730 Sleep apnea, unspecified: Secondary | ICD-10-CM | POA: Diagnosis present

## 2020-11-20 DIAGNOSIS — Z20822 Contact with and (suspected) exposure to covid-19: Secondary | ICD-10-CM | POA: Diagnosis present

## 2020-11-20 DIAGNOSIS — I110 Hypertensive heart disease with heart failure: Secondary | ICD-10-CM | POA: Diagnosis present

## 2020-11-20 DIAGNOSIS — I4819 Other persistent atrial fibrillation: Secondary | ICD-10-CM | POA: Diagnosis present

## 2020-11-20 DIAGNOSIS — I4891 Unspecified atrial fibrillation: Secondary | ICD-10-CM | POA: Diagnosis present

## 2020-11-20 DIAGNOSIS — Z820 Family history of epilepsy and other diseases of the nervous system: Secondary | ICD-10-CM

## 2020-11-20 DIAGNOSIS — M19042 Primary osteoarthritis, left hand: Secondary | ICD-10-CM | POA: Diagnosis present

## 2020-11-20 DIAGNOSIS — M069 Rheumatoid arthritis, unspecified: Secondary | ICD-10-CM | POA: Diagnosis present

## 2020-11-20 DIAGNOSIS — Z7951 Long term (current) use of inhaled steroids: Secondary | ICD-10-CM

## 2020-11-20 DIAGNOSIS — J449 Chronic obstructive pulmonary disease, unspecified: Secondary | ICD-10-CM | POA: Diagnosis present

## 2020-11-20 DIAGNOSIS — F17210 Nicotine dependence, cigarettes, uncomplicated: Secondary | ICD-10-CM | POA: Diagnosis present

## 2020-11-20 DIAGNOSIS — N400 Enlarged prostate without lower urinary tract symptoms: Secondary | ICD-10-CM | POA: Diagnosis present

## 2020-11-20 DIAGNOSIS — Z7901 Long term (current) use of anticoagulants: Secondary | ICD-10-CM

## 2020-11-20 DIAGNOSIS — Z79899 Other long term (current) drug therapy: Secondary | ICD-10-CM

## 2020-11-20 DIAGNOSIS — Z8042 Family history of malignant neoplasm of prostate: Secondary | ICD-10-CM

## 2020-11-20 DIAGNOSIS — E782 Mixed hyperlipidemia: Secondary | ICD-10-CM | POA: Diagnosis present

## 2020-11-20 DIAGNOSIS — G4733 Obstructive sleep apnea (adult) (pediatric): Secondary | ICD-10-CM | POA: Diagnosis present

## 2020-11-20 HISTORY — DX: Chronic atrial fibrillation, unspecified: I48.20

## 2020-11-20 LAB — COMPREHENSIVE METABOLIC PANEL
ALT: 18 U/L (ref 0–44)
AST: 32 U/L (ref 15–41)
Albumin: 3.2 g/dL — ABNORMAL LOW (ref 3.5–5.0)
Alkaline Phosphatase: 78 U/L (ref 38–126)
Anion gap: 11 (ref 5–15)
BUN: 29 mg/dL — ABNORMAL HIGH (ref 8–23)
CO2: 28 mmol/L (ref 22–32)
Calcium: 8.4 mg/dL — ABNORMAL LOW (ref 8.9–10.3)
Chloride: 93 mmol/L — ABNORMAL LOW (ref 98–111)
Creatinine, Ser: 0.98 mg/dL (ref 0.61–1.24)
GFR, Estimated: >60 mL/min (ref >60)
Glucose, Bld: 115 mg/dL — ABNORMAL HIGH (ref 70–99)
Potassium: 3.5 mmol/L (ref 3.5–5.1)
Sodium: 132 mmol/L — ABNORMAL LOW (ref 135–145)
Total Bilirubin: 1.5 mg/dL — ABNORMAL HIGH (ref 0.3–1.2)
Total Protein: 6.8 g/dL (ref 6.5–8.1)

## 2020-11-20 LAB — CBC WITH DIFFERENTIAL/PLATELET
Abs Immature Granulocytes: 0.03 10*3/uL (ref 0.00–0.07)
Basophils Absolute: 0.1 10*3/uL (ref 0.0–0.1)
Basophils Relative: 1 %
Eosinophils Absolute: 0.4 10*3/uL (ref 0.0–0.5)
Eosinophils Relative: 5 %
HCT: 41.6 % (ref 39.0–52.0)
Hemoglobin: 13.5 g/dL (ref 13.0–17.0)
Immature Granulocytes: 0 %
Lymphocytes Relative: 8 %
Lymphs Abs: 0.7 10*3/uL (ref 0.7–4.0)
MCH: 30.5 pg (ref 26.0–34.0)
MCHC: 32.5 g/dL (ref 30.0–36.0)
MCV: 93.9 fL (ref 80.0–100.0)
Monocytes Absolute: 0.6 10*3/uL (ref 0.1–1.0)
Monocytes Relative: 7 %
Neutro Abs: 7.1 10*3/uL (ref 1.7–7.7)
Neutrophils Relative %: 79 %
Platelets: 192 10*3/uL (ref 150–400)
RBC: 4.43 MIL/uL (ref 4.22–5.81)
RDW: 15.2 % (ref 11.5–15.5)
WBC: 9 10*3/uL (ref 4.0–10.5)
nRBC: 0 % (ref 0.0–0.2)

## 2020-11-20 LAB — RESP PANEL BY RT-PCR (FLU A&B, COVID) ARPGX2
Influenza A by PCR: NEGATIVE
Influenza B by PCR: NEGATIVE
SARS Coronavirus 2 by RT PCR: NEGATIVE

## 2020-11-20 LAB — LIPASE, BLOOD: Lipase: 21 U/L (ref 11–51)

## 2020-11-20 MED ORDER — FENTANYL CITRATE PF 50 MCG/ML IJ SOSY
50.0000 ug | PREFILLED_SYRINGE | Freq: Once | INTRAMUSCULAR | Status: AC
  Administered 2020-11-20: 50 ug via INTRAVENOUS
  Filled 2020-11-20: qty 1

## 2020-11-20 MED ORDER — ONDANSETRON HCL 4 MG/2ML IJ SOLN
4.0000 mg | Freq: Four times a day (QID) | INTRAMUSCULAR | Status: DC | PRN
  Administered 2020-11-21 – 2020-11-23 (×2): 4 mg via INTRAVENOUS
  Filled 2020-11-20 (×2): qty 2

## 2020-11-20 MED ORDER — TRIAMCINOLONE ACETONIDE 0.1 % EX CREA
1.0000 "application " | TOPICAL_CREAM | Freq: Three times a day (TID) | CUTANEOUS | Status: DC
  Administered 2020-11-21 – 2020-11-27 (×17): 1 via TOPICAL
  Filled 2020-11-20 (×3): qty 15

## 2020-11-20 MED ORDER — MORPHINE SULFATE (PF) 4 MG/ML IV SOLN
4.0000 mg | INTRAVENOUS | Status: DC | PRN
Start: 2020-11-20 — End: 2020-11-25
  Administered 2020-11-23 – 2020-11-25 (×5): 4 mg via INTRAVENOUS
  Filled 2020-11-20 (×6): qty 1

## 2020-11-20 MED ORDER — UMECLIDINIUM BROMIDE 62.5 MCG/INH IN AEPB
1.0000 | INHALATION_SPRAY | Freq: Every day | RESPIRATORY_TRACT | Status: DC
  Administered 2020-11-21 – 2020-11-27 (×7): 1 via RESPIRATORY_TRACT
  Filled 2020-11-20 (×2): qty 7

## 2020-11-20 MED ORDER — SIMVASTATIN 20 MG PO TABS: 20.0000 mg | ORAL_TABLET | Freq: Every day | ORAL | Status: DC

## 2020-11-20 MED ORDER — ALBUTEROL SULFATE (2.5 MG/3ML) 0.083% IN NEBU
2.5000 mg | INHALATION_SOLUTION | Freq: Four times a day (QID) | RESPIRATORY_TRACT | Status: DC | PRN
  Administered 2020-11-23: 2.5 mg via RESPIRATORY_TRACT
  Filled 2020-11-20: qty 3

## 2020-11-20 MED ORDER — ACETAMINOPHEN 650 MG RE SUPP: 650.0000 mg | Freq: Four times a day (QID) | RECTAL | Status: DC | PRN

## 2020-11-20 MED ORDER — HEPARIN SODIUM (PORCINE) 5000 UNIT/ML IJ SOLN
5000.0000 [IU] | Freq: Three times a day (TID) | INTRAMUSCULAR | Status: DC
  Administered 2020-11-20 – 2020-11-26 (×18): 5000 [IU] via SUBCUTANEOUS
  Filled 2020-11-20 (×17): qty 1

## 2020-11-20 MED ORDER — METOPROLOL TARTRATE 25 MG PO TABS
75.0000 mg | ORAL_TABLET | Freq: Two times a day (BID) | ORAL | Status: DC
  Administered 2020-11-20 – 2020-11-21 (×2): 75 mg via ORAL
  Filled 2020-11-20: qty 3

## 2020-11-20 MED ORDER — OXYCODONE HCL 5 MG PO TABS
5.0000 mg | ORAL_TABLET | ORAL | Status: DC | PRN
  Administered 2020-11-21: 5 mg via ORAL
  Filled 2020-11-20: qty 1

## 2020-11-20 MED ORDER — FLUTICASONE-UMECLIDIN-VILANT 100-62.5-25 MCG/INH IN AEPB: 1.0000 | INHALATION_SPRAY | Freq: Every day | RESPIRATORY_TRACT | Status: DC

## 2020-11-20 MED ORDER — SODIUM CHLORIDE 0.9 % IV SOLN: INTRAVENOUS | Status: AC

## 2020-11-20 MED ORDER — NICOTINE 21 MG/24HR TD PT24
21.0000 mg | MEDICATED_PATCH | Freq: Every day | TRANSDERMAL | Status: DC
  Administered 2020-11-22 – 2020-11-24 (×3): 21 mg via TRANSDERMAL
  Filled 2020-11-20 (×6): qty 1

## 2020-11-20 MED ORDER — TAMSULOSIN HCL 0.4 MG PO CAPS
0.4000 mg | ORAL_CAPSULE | Freq: Every day | ORAL | Status: DC
  Administered 2020-11-21: 0.4 mg via ORAL
  Filled 2020-11-20: qty 1

## 2020-11-20 MED ORDER — FLUTICASONE FUROATE-VILANTEROL 100-25 MCG/INH IN AEPB
1.0000 | INHALATION_SPRAY | Freq: Every day | RESPIRATORY_TRACT | Status: DC
  Administered 2020-11-21 – 2020-11-27 (×7): 1 via RESPIRATORY_TRACT
  Filled 2020-11-20 (×2): qty 28

## 2020-11-20 MED ORDER — ACETAMINOPHEN 325 MG PO TABS
650.0000 mg | ORAL_TABLET | Freq: Four times a day (QID) | ORAL | Status: DC | PRN
  Administered 2020-11-24: 650 mg via ORAL
  Filled 2020-11-20: qty 2

## 2020-11-20 MED ORDER — ONDANSETRON HCL 4 MG PO TABS
4.0000 mg | ORAL_TABLET | Freq: Four times a day (QID) | ORAL | Status: DC | PRN
  Filled 2020-11-20: qty 1

## 2020-11-20 MED ORDER — IOHEXOL 350 MG/ML SOLN
100.0000 mL | Freq: Once | INTRAVENOUS | Status: AC | PRN
  Administered 2020-11-20: 100 mL via INTRAVENOUS

## 2020-11-20 MED ORDER — BENAZEPRIL HCL 10 MG PO TABS
10.0000 mg | ORAL_TABLET | Freq: Every day | ORAL | Status: DC
  Administered 2020-11-21: 10 mg via ORAL
  Filled 2020-11-20: qty 1

## 2020-11-20 NOTE — ED Triage Notes (Signed)
Abdominal pain with vomiting 

## 2020-11-20 NOTE — ED Provider Notes (Signed)
[facility] Provider Note   CSN: [id number] Arrival date & time: 11/20/20  1431     History Chief Complaint  Patient presents with   Abdominal Pain    Korby Caperton is a 70 y.o. male.   Abdominal Pain Associated symptoms: no chest pain, no fever and no shortness of breath   Patient presents abdominal pain.  Diffuse.  Has had since Friday.  States worse with eating.  Did have 2 episodes of vomiting.  States he is still been passing bowel movements.  Pain is dull.  This diffuse over the abdomen.  Has been seen in the ER for abdominal pain in January.  States that was different that 1 in the right upper abdomen this is diffuse.  He states the abdomen may be more swollen than normal 2.  He is on chronic oxygen.    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   Sleep apnea 02/13/2016    Patient Active Problem List   Diagnosis Date Noted   Obesity, Class III, BMI 40-49.9 (morbid obesity) ([place])    COPD with acute exacerbation ([place])    Acute on chronic diastolic HF (heart failure) ([place])    Pressure injury of skin 11/28/2019   Acute respiratory failure with hypoxia ([place]) 11/27/2019   Atrial fibrillation ([place]) 05/11/2018   Encounter for therapeutic drug monitoring 04/22/2018   Persistent atrial fibrillation ([place]) 04/08/2018   History of rheumatoid arthritis seronegative 09/10/2017   High risk medications (not anticoagulants) long-term use 02/14/2016   Class 3 obesity in adult 02/14/2016   Depression 02/14/2016   Osteoarthritis of both hands 02/13/2016   Osteoarthritis of both  knees 02/13/2016   DDD (degenerative disc disease), lumbar 02/13/2016   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   Hypertension 02/13/2016   Elevated cholesterol 02/13/2016   Insomnia 02/13/2016   Anxiety 02/13/2016   Sleep apnea 02/13/2016   Arrhythmia 02/13/2016    Past Surgical History:  Procedure Laterality Date   A-FLUTTER ABLATION     at [facility] 2018   CARDIOVERSION  02/10/2018   CARDIOVERSION N/A 05/13/2018   Procedure: CARDIOVERSION;  Surgeon: Sanda Klein, MD;  Location: [facility];  Service: Cardiovascular;  Laterality: N/A;   CATARACT EXTRACTION         Family History  Problem Relation Age of Onset   Hypertension Father    Heart attack Father    Prostate cancer Father    Hypertension Sister    Hypertension Sister    Hypertension Sister    Epilepsy Son    Diabetes Son     Social History   Tobacco Use   Smoking status: Every Day    Packs/day: 2.00    Years: 35.00    Pack years: 70.00    Types: Cigarettes    Start date: 05/28/1971   Smokeless tobacco: Never  Vaping Use   Vaping Use: Never used  Substance Use Topics   Alcohol use: No   Drug use: No    Home Medications Prior to Admission medications   Medication Sig Start Date End Date Taking? Authorizing Provider  acetaminophen (TYLENOL)  500 MG tablet Take 500 mg by mouth every 6 (six) hours as needed.    [provider]  albuterol (VENTOLIN HFA) 108 (90 Base) MCG/ACT inhaler Inhale 1 puff into the lungs every 6 (six) hours as needed. 11/10/19   [provider]  benazepril (LOTENSIN) 10 MG tablet Take 1 tablet (10 mg total) by mouth daily. 07/13/20   Allred, Jeneen Rinks, MD  doxycycline (VIBRAMYCIN) 100 MG capsule Take 100 mg by mouth daily. 02/25/20   [provider]  ketoconazole (NIZORAL) 2 % cream Apply 1 application topically 2 (two) times daily as needed for irritation. 03/27/20   [provider]  Metoprolol Tartrate 75 MG TABS TAKE 1 TABLET TWICE A DAY 06/27/20   Arnoldo Lenis, MD  simvastatin (ZOCOR) 20 MG tablet TAKE 1 TABLET BY MOUTH DAILY AT 6 PM. 09/21/20   Branch, Alphonse Guild, MD  tamsulosin (FLOMAX) 0.4 MG CAPS capsule Take by mouth daily. 07/23/17   [provider]  torsemide (DEMADEX) 20 MG tablet Take 3 tablets (60 mg total) by mouth 2 (two) times daily. 07/13/20   Allred, Jeneen Rinks, MD  TRELEGY ELLIPTA 100-62.5-25 MCG/INH AEPB Inhale 1 puff into the lungs daily. 04/02/20   [provider]  triamcinolone (KENALOG) 0.1 % Apply 1 application topically 3 (three) times daily. 02/25/20   [provider]  warfarin (COUMADIN) 10 MG tablet TAKE 1/2 TO 1 TABLET DAILY AS DIRECTED BY COUMADIN CLINIC. 05/31/20   Arnoldo Lenis, MD    Allergies    Celebrex [celecoxib], Methotrexate derivatives, and Sulfa antibiotics  Review of Systems   Review of Systems  Constitutional:  Negative for appetite change and fever.  HENT:  Negative for congestion.   Respiratory:  Negative for shortness of breath.   Cardiovascular:  Negative for chest pain.  Gastrointestinal:  Positive for abdominal pain.  Genitourinary:  Negative for flank pain.  Skin:  Negative for rash.  Neurological:  Negative for weakness.   Physical Exam Updated Vital Signs BP 104/65   Pulse 82   Temp 98.1 F (36.7 C)   Resp 20   Ht '6\' 1"'$  (1.854 m)   Wt (!) 168 kg   SpO2 100%   BMI 48.86 kg/m   Physical Exam Vitals and nursing note reviewed.  Constitutional:      Appearance: He is well-developed.  HENT:     Head: Atraumatic.  Cardiovascular:     Rate and Rhythm: Normal rate.  Abdominal:     Comments: Somewhat protuberant abdomen.  Mild diffuse tenderness.  Worse in mid abdomen.  No rebound or guarding.  No hernia palpated.  Skin:    General: Skin is warm.     Capillary Refill: Capillary refill takes less than 2 seconds.  Neurological:     Mental Status: He is oriented to person, place, and time.    ED Results / Procedures / Treatments   Labs (all labs  ordered are listed, but only abnormal results are displayed) Labs Reviewed  COMPREHENSIVE METABOLIC PANEL - Abnormal; Notable for the following components:      Result Value   Sodium 132 (*)    Chloride 93 (*)    Glucose, Bld 115 (*)    BUN 29 (*)    Calcium 8.4 (*)    Albumin 3.2 (*)    Total Bilirubin 1.5 (*)    All other components within normal limits  RESP PANEL BY RT-PCR (FLU A&B, COVID) ARPGX2  LIPASE, BLOOD  CBC WITH DIFFERENTIAL/PLATELET  EKG None  Radiology CT ABDOMEN PELVIS W CONTRAST  Result Date: 11/20/2020 CLINICAL DATA:  Acute abdominal pain.  Vomiting. EXAM: CT ABDOMEN AND PELVIS WITH CONTRAST TECHNIQUE: Multidetector CT imaging of the abdomen and pelvis was performed using the standard protocol following bolus administration of intravenous contrast. CONTRAST:  142m OMNIPAQUE IOHEXOL 350 MG/ML SOLN COMPARISON:  CT abdomen 04/22/2018 FINDINGS: Lower chest: Atelectasis LEFT lung base. Elevated LEFT hemidiaphragm diaphragm Hepatobiliary: Simple fluid attenuation lesion in the LEFT hepatic lobe not changed from prior. Probable collection of small benign cysts. No biliary duct dilatation. Gallbladder normal. Suggestion of sludge within the gallbladder. Pancreas: Pancreas is normal. No ductal dilatation. No pancreatic inflammation. Spleen: Normal spleen Adrenals/urinary tract: Adrenal glands and kidneys are normal. The ureters and bladder normal. Stomach/Bowel: Stomach is normal.  Duodenum is normal. There is laxity of the abdominal wall such that the entirety of the abdomen is not imaged. There are dilated loops of small bowel within the mid jejunum and ileum measuring up to 5.4 cm (coronal image 23/series 5) the small bowel is collapsed leading up to the terminal ileum. Appendix normal. No transitional point is identified No pneumatosis or portal venous gas. Small volume stool in the colon. Gas throughout colon. Gas in the rectum. Vascular/Lymphatic: Abdominal aorta is normal  caliber with atherosclerotic calcification. There is no retroperitoneal or periportal lymphadenopathy. No pelvic lymphadenopathy. Reproductive: Prostate un remark Other: No intraperitoneal free air.  No intraperitoneal free fluid Musculoskeletal: No aggressive osseous lesion. IMPRESSION: 1. Small bowel obstruction pattern with multiple dilated loops of mid small bowel and collapsed distal ileum. No discrete transition point identified. 2. No pneumatosis or portal venous gas identified.  No perforation 3. Stool and gas in the colon suggests early obstruction. Electronically Signed   By: SSuzy BouchardM.D.   On: 11/20/2020 17:32    Procedures Procedures   Medications Ordered in ED Medications  fentaNYL (SUBLIMAZE) injection 50 mcg (50 mcg Intravenous Given 11/20/20 1605)  iohexol (OMNIPAQUE) 350 MG/ML injection 100 mL (100 mLs Intravenous Contrast Given 11/20/20 1652)    ED Course  I have reviewed the triage vital signs and the nursing notes.  Pertinent labs & imaging results that were available during my care of the patient were reviewed by me and considered in my medical decision making (see chart for details).    MDM Rules/Calculators/A&P                           Patient presents abdominal pain.  Some distention.  Has had some nausea and vomiting.  However nausea vomit was couple days ago.  Still having bowel movements.  Abdomen more distended.  CT scan done and showed likely partial small bowel obstruction.  No previous abdominal surgeries.  Lab work overall reassuring.  Discussed with Dr. JArnoldo Moralefrom general surgery.  Will see patient in follow-up tomorrow.  No immediate plans for OR.  Will admit to internal medicine. Final Clinical Impression(s) / ED Diagnoses Final diagnoses:  Small bowel obstruction ([facility]    Rx / DC Orders ED Discharge Orders     None        PDavonna Belling MD 11/20/20 [phone]

## 2020-11-20 NOTE — ED Triage Notes (Signed)
Has had abdominal pain since Friday. Has vomited yesterday. Pt. Is on 4 L of O2.

## 2020-11-20 NOTE — ED Notes (Addendum)
Attempted NG tube placement x2 pt grabbed tubed and removed stating "take it out. I cant do that" MD informed at this time. Pt informed to please let the nurse know if he is willing ot try again because it would be beneficial to him. Pt states "I will let you know if I think I can try again. I am sorry."

## 2020-11-21 ENCOUNTER — Observation Stay (HOSPITAL_COMMUNITY): Payer: Medicare Other

## 2020-11-21 ENCOUNTER — Inpatient Hospital Stay (HOSPITAL_COMMUNITY): Payer: Medicare Other

## 2020-11-21 DIAGNOSIS — K56609 Unspecified intestinal obstruction, unspecified as to partial versus complete obstruction: Secondary | ICD-10-CM

## 2020-11-21 DIAGNOSIS — F17200 Nicotine dependence, unspecified, uncomplicated: Secondary | ICD-10-CM

## 2020-11-21 DIAGNOSIS — E441 Mild protein-calorie malnutrition: Secondary | ICD-10-CM

## 2020-11-21 LAB — COMPREHENSIVE METABOLIC PANEL
ALT: 17 U/L (ref 0–44)
AST: 23 U/L (ref 15–41)
Albumin: 2.8 g/dL — ABNORMAL LOW (ref 3.5–5.0)
Alkaline Phosphatase: 70 U/L (ref 38–126)
Anion gap: 9 (ref 5–15)
BUN: 24 mg/dL — ABNORMAL HIGH (ref 8–23)
CO2: 32 mmol/L (ref 22–32)
Calcium: 8.3 mg/dL — ABNORMAL LOW (ref 8.9–10.3)
Chloride: 95 mmol/L — ABNORMAL LOW (ref 98–111)
Creatinine, Ser: 0.87 mg/dL (ref 0.61–1.24)
GFR, Estimated: >60 mL/min (ref >60)
Glucose, Bld: 92 mg/dL (ref 70–99)
Potassium: 3.1 mmol/L — ABNORMAL LOW (ref 3.5–5.1)
Sodium: 136 mmol/L (ref 135–145)
Total Bilirubin: 1.6 mg/dL — ABNORMAL HIGH (ref 0.3–1.2)
Total Protein: 5.9 g/dL — ABNORMAL LOW (ref 6.5–8.1)

## 2020-11-21 LAB — CBC WITH DIFFERENTIAL/PLATELET
Abs Immature Granulocytes: 0.03 10*3/uL (ref 0.00–0.07)
Basophils Absolute: 0 10*3/uL (ref 0.0–0.1)
Basophils Relative: 1 %
Eosinophils Absolute: 0.4 10*3/uL (ref 0.0–0.5)
Eosinophils Relative: 5 %
HCT: 38.4 % — ABNORMAL LOW (ref 39.0–52.0)
Hemoglobin: 12.3 g/dL — ABNORMAL LOW (ref 13.0–17.0)
Immature Granulocytes: 0 %
Lymphocytes Relative: 11 %
Lymphs Abs: 0.8 10*3/uL (ref 0.7–4.0)
MCH: 30.1 pg (ref 26.0–34.0)
MCHC: 32 g/dL (ref 30.0–36.0)
MCV: 94.1 fL (ref 80.0–100.0)
Monocytes Absolute: 0.6 10*3/uL (ref 0.1–1.0)
Monocytes Relative: 8 %
Neutro Abs: 5.8 10*3/uL (ref 1.7–7.7)
Neutrophils Relative %: 75 %
Platelets: 176 10*3/uL (ref 150–400)
RBC: 4.08 MIL/uL — ABNORMAL LOW (ref 4.22–5.81)
RDW: 15.2 % (ref 11.5–15.5)
WBC: 7.6 10*3/uL (ref 4.0–10.5)
nRBC: 0 % (ref 0.0–0.2)

## 2020-11-21 LAB — PROTIME-INR
INR: 1.9 — ABNORMAL HIGH (ref 0.8–1.2)
Prothrombin Time: 21.9 seconds — ABNORMAL HIGH (ref 11.4–15.2)

## 2020-11-21 LAB — MAGNESIUM: Magnesium: 2 mg/dL (ref 1.7–2.4)

## 2020-11-21 MED ORDER — POTASSIUM CHLORIDE 10 MEQ/100ML IV SOLN
10.0000 meq | INTRAVENOUS | Status: AC
  Administered 2020-11-21 (×5): 10 meq via INTRAVENOUS
  Filled 2020-11-21 (×3): qty 100

## 2020-11-21 MED ORDER — POTASSIUM CHLORIDE 10 MEQ/100ML IV SOLN
10.0000 meq | Freq: Once | INTRAVENOUS | Status: AC
  Administered 2020-11-21: 10 meq via INTRAVENOUS

## 2020-11-21 MED ORDER — LIDOCAINE HCL URETHRAL/MUCOSAL 2 % EX GEL
1.0000 "application " | Freq: Once | CUTANEOUS | Status: AC
  Administered 2020-11-21: 1 via TOPICAL
  Filled 2020-11-21: qty 10

## 2020-11-21 MED ORDER — SODIUM CHLORIDE 0.9 % IV SOLN: INTRAVENOUS | Status: DC

## 2020-11-21 MED ORDER — PHENOL 1.4 % MT LIQD
1.0000 | OROMUCOSAL | Status: DC | PRN
  Administered 2020-11-21: 1 via OROMUCOSAL
  Filled 2020-11-21: qty 177

## 2020-11-21 NOTE — Progress Notes (Signed)
Patient unable to wear CPAP tonight due to NG tube. Patient's home unit at bedside and his O2 sats are 99% on 6 lpm. Titrated down to 4 lpm which is what he wears at home.

## 2020-11-21 NOTE — Care Management CC44 (Signed)
Condition Code 44 Documentation Completed  Patient Details  Name: Myzel Tillis MRN: [id number] Date of Birth: [birth year]   Condition Code 44 given:  Yes Patient signature on Condition Code 44 notice:  Yes Documentation of 2 MD's agreement:  Yes Code 44 added to claim:  Yes    Shade Flood, LCSW 11/21/2020, 2:57 PM

## 2020-11-21 NOTE — Care Management Obs Status (Signed)
[place] NOTIFICATION   Patient Details  Name: Jesus Rodriguez MRN: [id number] Date of Birth: 09-Sep-1950   Medicare Observation Status Notification Given:  Yes    Shade Flood, LCSW 11/21/2020, 2:57 PM

## 2020-11-21 NOTE — Progress Notes (Addendum)
  PROGRESS NOTE  Patient admitted earlier this morning. See H&P.   Jesus Rodriguez  is a 70 y.o. male, with history of anxiety, congestive heart failure, atrial flutter, degenerative disc disease, essential hypertension, mixed hyperlipidemia, persistent atrial fibrillation, rheumatoid arthritis, sleep apnea and more presents to ED with a chief complaint of abdominal pain.  Findings consistent with SBO.   On examination, patient states that he had 2 loose bowel movements of small quantity this morning, his abdomen feels much softer although some soreness in the epigastrium.  No nausea or vomiting.  Appreciate general surgery Advance diet to clear liquids and monitor Continue IVF  Replace potassium Repeat KUB in AM    Status is: observation   Remains observation appropriate because: does not meet severity criteria for inpatient admission  Dispo: The patient is from: Home              Anticipated d/c is to: Home              Patient currently is not medically stable to d/c.   Difficult to place patient No       Dessa Phi, DO [facility] 11/21/2020, 12:17 PM  Available via Epic secure chat 7am-7pm After these hours, please refer to coverage provider listed on [email or web address]

## 2020-11-21 NOTE — Plan of Care (Signed)
Pt admitted for small bowel obstruction, hx of COPD. Pt on 4L oxygen N/C, tele NSR 75 ml hr of NS. BP (!) 103/58   Pulse 67   Temp 98 F (36.7 C) (Oral)   Resp (!) 23   Ht '6\' 1"'$  (1.854 m)   Wt (!) 168 kg   SpO2 100%   BMI 48.86 kg/m  Pt oriented to room, call bell nearby, bed alarm on and bed in lowest position. No needs mentioned at this time. [name], 3:31 AM 11/21/20

## 2020-11-21 NOTE — Consult Note (Signed)
Reason for Consult: Partial small bowel obstruction Referring Physician: Dr. Lelon Huh is an 70 y.o. male.  HPI: Patient is a 70 year old white male who presented to the emergency room with a 4 day history of worsening mid abdominal cramping and pain.  He states he has not had a bowel movement in several days.  He has never had surgery.  It was made worse when he would eat.  As it was not resolving, he presented to the emergency room.  CT scan of the abdomen revealed a possible small bowel obstruction.  He was admitted to the hospital for further evaluation and treatment.  Overnight, he had several bowel movements and is passing flatus.  He states he feels much better and his abdominal pain has resolved.  He normally has a bowel movement every 3 to 4 days.  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   Sleep apnea 02/13/2016    Past Surgical History:  Procedure Laterality Date   A-FLUTTER ABLATION     at [facility] 2018   CARDIOVERSION  02/10/2018   CARDIOVERSION N/A 05/13/2018   Procedure: CARDIOVERSION;  Surgeon: Sanda Klein, MD;  Location: [facility] END[facility];  Service: Cardiovascular;  Laterality: N/A;   CATARACT EXTRACTION      Family History  Problem Relation Age of Onset   Hypertension Father    Heart attack Father    Prostate cancer Father    Hypertension Sister    Hypertension Sister    Hypertension Sister    Epilepsy Son    Diabetes Son     Social History:  reports that he has been smoking cigarettes. He started smoking about 49 years ago. He has a 70.00 pack-year smoking history. He  has never used smokeless tobacco. He reports that he does not drink alcohol and does not use drugs.  Allergies:  Allergies  Allergen Reactions   Celebrex [Celecoxib]    Methotrexate Derivatives Other (See Comments)    Arrhythmia    Sulfa Antibiotics     Medications: I have reviewed the patient's current medications. Prior to Admission:  Medications Prior to Admission  Medication Sig Dispense Refill Last Dose   acetaminophen (TYLENOL) 500 MG tablet Take 500 mg by mouth every 6 (six) hours as needed.      albuterol (VENTOLIN HFA) 108 (90 Base) MCG/ACT inhaler Inhale 1 puff into the lungs every 6 (six) hours as needed.      benazepril (LOTENSIN) 10 MG tablet Take 1 tablet (10 mg total) by mouth daily. 30 tablet 6    doxycycline (VIBRAMYCIN) 100 MG capsule Take 100 mg by mouth daily.      ketoconazole (NIZORAL) 2 % cream Apply 1 application topically 2 (two) times daily as needed for irritation.      Metoprolol Tartrate 75 MG TABS TAKE 1 TABLET TWICE A DAY 180 tablet 2    simvastatin (ZOCOR) 20 MG tablet TAKE 1 TABLET BY MOUTH DAILY AT 6 PM. 90 tablet 1    tamsulosin (FLOMAX) 0.4 MG CAPS capsule Take by mouth daily.      torsemide (DEMADEX) 20 MG  tablet Take 3 tablets (60 mg total) by mouth 2 (two) times daily. 180 tablet 6    TRELEGY ELLIPTA 100-62.5-25 MCG/INH AEPB Inhale 1 puff into the lungs daily.      triamcinolone (KENALOG) 0.1 % Apply 1 application topically 3 (three) times daily.      warfarin (COUMADIN) 10 MG tablet TAKE 1/2 TO 1 TABLET DAILY AS DIRECTED BY COUMADIN CLINIC. 90 tablet 1     Results for orders placed or performed during the hospital encounter of 11/20/20 (from the past 48 hour(s))  Resp Panel by RT-PCR (Flu A&B, Covid) Nasopharyngeal Swab     Status: None   Collection Time: 11/20/20  3:31 PM   Specimen: Nasopharyngeal Swab; Nasopharyngeal(NP) swabs in vial transport medium  Result Value Ref Range   SARS Coronavirus 2 by RT PCR NEGATIVE NEGATIVE    Comment:  (NOTE) SARS-CoV-2 target nucleic acids are NOT DETECTED.  The SARS-CoV-2 RNA is generally detectable in upper respiratory specimens during the acute phase of infection. The lowest concentration of SARS-CoV-2 viral copies this assay can detect is 138 copies/mL. A negative result does not preclude SARS-Cov-2 infection and should not be used as the sole basis for treatment or other patient management decisions. A negative result may occur with  improper specimen collection/handling, submission of specimen other than nasopharyngeal swab, presence of viral mutation(s) within the areas targeted by this assay, and inadequate number of viral copies(<138 copies/mL). A negative result must be combined with clinical observations, patient history, and epidemiological information. The expected result is Negative.  Fact Sheet for Patients:  EntrepreneurPulse.com.au  Fact Sheet for Healthcare Providers:  IncredibleEmployment.be  This test is no t yet approved or cleared by the [place] FDA and  has been authorized for detection and/or diagnosis of SARS-CoV-2 by FDA under an Emergency Use Authorization (EUA). This EUA will remain  in effect (meaning this test can be used) for the duration of the COVID-19 declaration under Section 564(b)(1) of the Act, 21 U.S.C.section 360bbb-3(b)(1), unless the authorization is terminated  or revoked sooner.       Influenza A by PCR NEGATIVE NEGATIVE   Influenza B by PCR NEGATIVE NEGATIVE    Comment: (NOTE) The Xpert Xpress SARS-CoV-2/FLU/RSV plus assay is intended as an aid in the diagnosis of influenza from Nasopharyngeal swab specimens and should not be used as a sole basis for treatment. Nasal washings and aspirates are unacceptable for Xpert Xpress SARS-CoV-2/FLU/RSV testing.  Fact Sheet for Patients: EntrepreneurPulse.com.au  Fact Sheet for Healthcare  Providers: IncredibleEmployment.be  This test is not yet approved or cleared by the [place] FDA and has been authorized for detection and/or diagnosis of SARS-CoV-2 by FDA under an Emergency Use Authorization (EUA). This EUA will remain in effect (meaning this test can be used) for the duration of the COVID-19 declaration under Section 564(b)(1) of the Act, 21 U.S.C. section 360bbb-3(b)(1), unless the authorization is terminated or revoked.  Performed at [facility], [address] [place], [place] [address]   Comprehensive metabolic panel     Status: Abnormal   Collection Time: 11/20/20  3:50 PM  Result Value Ref Range   Sodium 132 (L) 135 - 145 mmol/L   Potassium 3.5 3.5 - 5.1 mmol/L   Chloride 93 (L) 98 - 111 mmol/L   CO2 28 22 - 32 mmol/L   Glucose, Bld 115 (H) 70 - 99 mg/dL    Comment: Glucose reference range applies only to samples taken after fasting for at least 8 hours.  BUN 29 (H) 8 - 23 mg/dL   Creatinine, Ser 0.98 0.61 - 1.24 mg/dL   Calcium 8.4 (L) 8.9 - 10.3 mg/dL   Total Protein 6.8 6.5 - 8.1 g/dL   Albumin 3.2 (L) 3.5 - 5.0 g/dL   AST 32 15 - 41 U/L   ALT 18 0 - 44 U/L   Alkaline Phosphatase 78 38 - 126 U/L   Total Bilirubin 1.5 (H) 0.3 - 1.2 mg/dL   GFR, Estimated >60 >60 mL/min    Comment: (NOTE) Calculated using the CKD-EPI Creatinine Equation (2021)    Anion gap 11 5 - 15    Comment: Performed at [facility], [address] [place], [place] [address]  Lipase, blood     Status: None   Collection Time: 11/20/20  3:50 PM  Result Value Ref Range   Lipase 21 11 - 51 U/L    Comment: Performed at [facility], [address], [place], [place] [address]  CBC with Differential     Status: None   Collection Time: 11/20/20  3:50 PM  Result Value Ref Range   WBC 9.0 4.0 - 10.5 K/uL   RBC 4.43 4.22 - 5.81 MIL/uL   Hemoglobin 13.5 13.0 - 17.0 g/dL   HCT 41.6 39.0 - 52.0 %   MCV 93.9 80.0 - 100.0 fL   MCH 30.5 26.0 - 34.0 pg   MCHC  32.5 30.0 - 36.0 g/dL   RDW 15.2 11.5 - 15.5 %   Platelets 192 150 - 400 K/uL   nRBC 0.0 0.0 - 0.2 %   Neutrophils Relative % 79 %   Neutro Abs 7.1 1.7 - 7.7 K/uL   Lymphocytes Relative 8 %   Lymphs Abs 0.7 0.7 - 4.0 K/uL   Monocytes Relative 7 %   Monocytes Absolute 0.6 0.1 - 1.0 K/uL   Eosinophils Relative 5 %   Eosinophils Absolute 0.4 0.0 - 0.5 K/uL   Basophils Relative 1 %   Basophils Absolute 0.1 0.0 - 0.1 K/uL   Immature Granulocytes 0 %   Abs Immature Granulocytes 0.03 0.00 - 0.07 K/uL    Comment: Performed at [facility], [address], [place], [place] [address]  Magnesium     Status: None   Collection Time: 11/21/20  4:31 AM  Result Value Ref Range   Magnesium 2.0 1.7 - 2.4 mg/dL    Comment: Performed at [facility], [address] [place], [place] [address]  Comprehensive metabolic panel     Status: Abnormal   Collection Time: 11/21/20  4:31 AM  Result Value Ref Range   Sodium 136 135 - 145 mmol/L   Potassium 3.1 (L) 3.5 - 5.1 mmol/L   Chloride 95 (L) 98 - 111 mmol/L   CO2 32 22 - 32 mmol/L   Glucose, Bld 92 70 - 99 mg/dL    Comment: Glucose reference range applies only to samples taken after fasting for at least 8 hours.   BUN 24 (H) 8 - 23 mg/dL   Creatinine, Ser 0.87 0.61 - 1.24 mg/dL   Calcium 8.3 (L) 8.9 - 10.3 mg/dL   Total Protein 5.9 (L) 6.5 - 8.1 g/dL   Albumin 2.8 (L) 3.5 - 5.0 g/dL   AST 23 15 - 41 U/L   ALT 17 0 - 44 U/L   Alkaline Phosphatase 70 38 - 126 U/L   Total Bilirubin 1.6 (H) 0.3 - 1.2 mg/dL   GFR, Estimated >60 >60 mL/min    Comment: (NOTE) Calculated  using the CKD-EPI Creatinine Equation (2021)    Anion gap 9 5 - 15    Comment: Performed at [facility], [address] [place], [place] [address]  CBC WITH DIFFERENTIAL     Status: Abnormal   Collection Time: 11/21/20  4:31 AM  Result Value Ref Range   WBC 7.6 4.0 - 10.5 K/uL   RBC 4.08 (L) 4.22 - 5.81 MIL/uL   Hemoglobin 12.3 (L) 13.0 - 17.0 g/dL   HCT 38.4 (L) 39.0 - 52.0 %    MCV 94.1 80.0 - 100.0 fL   MCH 30.1 26.0 - 34.0 pg   MCHC 32.0 30.0 - 36.0 g/dL   RDW 15.2 11.5 - 15.5 %   Platelets 176 150 - 400 K/uL   nRBC 0.0 0.0 - 0.2 %   Neutrophils Relative % 75 %   Neutro Abs 5.8 1.7 - 7.7 K/uL   Lymphocytes Relative 11 %   Lymphs Abs 0.8 0.7 - 4.0 K/uL   Monocytes Relative 8 %   Monocytes Absolute 0.6 0.1 - 1.0 K/uL   Eosinophils Relative 5 %   Eosinophils Absolute 0.4 0.0 - 0.5 K/uL   Basophils Relative 1 %   Basophils Absolute 0.0 0.0 - 0.1 K/uL   Immature Granulocytes 0 %   Abs Immature Granulocytes 0.03 0.00 - 0.07 K/uL    Comment: Performed at [facility], [address] [place], [place] [address]  Protime-INR     Status: Abnormal   Collection Time: 11/21/20  4:31 AM  Result Value Ref Range   Prothrombin Time 21.9 (H) 11.4 - 15.2 seconds   INR 1.9 (H) 0.8 - 1.2    Comment: (NOTE) INR goal varies based on device and disease states. Performed at [facility], [address] [place], [place] [address]     DG Abd 1 View  Result Date: 11/21/2020 CLINICAL DATA:  70 year old male with small bowel obstruction. EXAM: ABDOMEN - 1 VIEW COMPARISON:  CT Abdomen and Pelvis 11/20/2020. FINDINGS: Portable AP supine view at 0443 hours. Small-bowel obstruction pattern persists, and has not significantly changed. Some of the gas-filled small bowel loops may be slightly less dilated since yesterday. Excreted IV contrast in the urinary bladder. No enteric tube identified. Stable elevated left hemidiaphragm. No acute osseous abnormality identified. IMPRESSION: Small-bowel obstruction gas pattern not significantly changed since yesterday. Electronically Signed   By: Genevie Ann M.D.   On: 11/21/2020 06:09   CT ABDOMEN PELVIS W CONTRAST  Result Date: 11/20/2020 CLINICAL DATA:  Acute abdominal pain.  Vomiting. EXAM: CT ABDOMEN AND PELVIS WITH CONTRAST TECHNIQUE: Multidetector CT imaging of the abdomen and pelvis was performed using the standard protocol following bolus  administration of intravenous contrast. CONTRAST:  163m OMNIPAQUE IOHEXOL 350 MG/ML SOLN COMPARISON:  CT abdomen 04/22/2018 FINDINGS: Lower chest: Atelectasis LEFT lung base. Elevated LEFT hemidiaphragm diaphragm Hepatobiliary: Simple fluid attenuation lesion in the LEFT hepatic lobe not changed from prior. Probable collection of small benign cysts. No biliary duct dilatation. Gallbladder normal. Suggestion of sludge within the gallbladder. Pancreas: Pancreas is normal. No ductal dilatation. No pancreatic inflammation. Spleen: Normal spleen Adrenals/urinary tract: Adrenal glands and kidneys are normal. The ureters and bladder normal. Stomach/Bowel: Stomach is normal.  Duodenum is normal. There is laxity of the abdominal wall such that the entirety of the abdomen is not imaged. There are dilated loops of small bowel within the mid jejunum and ileum measuring up to 5.4 cm (coronal image 23/series 5) the small bowel is collapsed leading up  to the terminal ileum. Appendix normal. No transitional point is identified No pneumatosis or portal venous gas. Small volume stool in the colon. Gas throughout colon. Gas in the rectum. Vascular/Lymphatic: Abdominal aorta is normal caliber with atherosclerotic calcification. There is no retroperitoneal or periportal lymphadenopathy. No pelvic lymphadenopathy. Reproductive: Prostate un remark Other: No intraperitoneal free air.  No intraperitoneal free fluid Musculoskeletal: No aggressive osseous lesion. IMPRESSION: 1. Small bowel obstruction pattern with multiple dilated loops of mid small bowel and collapsed distal ileum. No discrete transition point identified. 2. No pneumatosis or portal venous gas identified.  No perforation 3. Stool and gas in the colon suggests early obstruction. Electronically Signed   By: Suzy Bouchard M.D.   On: 11/20/2020 17:32    ROS:  Pertinent items are noted in HPI.  Blood pressure 128/79, pulse 73, temperature (!) 97.5 F (36.4 C),  temperature source Oral, resp. rate 20, height '6\' 1"'$  (1.854 m), weight (!) 168 kg, SpO2 90 %. Physical Exam: Morbidly obese white male wearing a CPAP mask, in no acute distress Head is normocephalic, atraumatic Lungs clear to auscultation without wheezing Heart examination reveals regular rate and rhythm without S3, S4, murmurs Abdomen is nontender, nondistended.  Occasional bowel sounds appreciated.  No rigidity is noted.  CT scan images personally reviewed  Assessment/Plan: Impression: Partial small bowel obstruction of unknown etiology.  Clinically, it seems to be resolving.  No need for acute surgical invention at this time. Plan: Will start a clear liquid diet.  May advance diet as tolerated.  We will follow with you.  Aviva Signs 11/21/2020, 10:53 AM

## 2020-11-21 NOTE — Progress Notes (Signed)
   11/21/20 1800  Assess: MEWS Score  Temp 97.8 F (36.6 C)  BP (!) 81/59  Pulse Rate 88  Resp (!) 24  Level of Consciousness Alert  SpO2 100 %  O2 Device Nasal Cannula  O2 Flow Rate (L/min) 2 L/min  Assess: MEWS Score  MEWS Temp 0  MEWS Systolic 1  MEWS Pulse 0  MEWS RR 1  MEWS LOC 0  MEWS Score 2  MEWS Score Color Yellow  Assess: if the MEWS score is Yellow or Red  Were vital signs taken at a resting state? No  Focused Assessment No change from prior assessment  Early Detection of Sepsis Score *See Row Information* Low  MEWS guidelines implemented *See Row Information* No, vital signs rechecked

## 2020-11-21 NOTE — H&P (Signed)
TRH H&P    Patient Demographics:    Jesus Rodriguez, is a 70 y.o. male  MRN: [id number]  DOB - [birth year]  Admit Date - 11/20/2020  Referring MD/NP/PA: Alvino Chapel  Outpatient Primary MD for the patient is Patient, [name] (Inactive)  Patient coming from: Home  Chief complaint- abdominal pain   HPI:    Jesus Rodriguez  is a 70 y.o. male, with history of anxiety, congestive heart failure, atrial flutter, degenerative disc disease, essential hypertension, mixed hyperlipidemia, persistent atrial fibrillation, rheumatoid arthritis, sleep apnea and more presents to ED with a chief complaint of abdominal pain.  It started 4 days ago.  He describes the pain as stabbing, burning, aching, and pressure.  He reports it comes in waves.  It is worse with p.o. intake.  It better after vomiting.  He reports his last bowel movement was 2 small movements yesterday.  Having a bowel movement does not affect the pain.  Patient reports multiple known bloody episodes of emesis.  He reports that his abdomen is bloated.  He does not have any history of abdominal surgeries, no history of Crohn's or ulcerative colitis.  His last colonoscopy was 15 years ago.  Patient has no other complaints at this time.  Patient smokes 2 packs a day.  He occasionally drinks alcohol.  He does not use illicit drugs.  He is vaccinated for COVID.  Patient is full code.  Patient is obese, with heart failure.  We discussed his diet habits which include regular visits to [employer].  When he is shopping at the store he admits to buying salty canned foods and meats we discussed extensively the importance of smoking cessation and healthier lifestyle choices for better quality of life even if he is reached the point where he does not concern himself with quantity/longevity.  In the ED Temp 98.1, heart rate 74-1 01, respiratory rate 8-23, blood pressure 94/60,  satting 100% on 4 L nasal cannula which is his baseline oxygen requirement Respiratory panel negative No leukocytosis, hemoglobin 13.5 Slight hyponatremia 132, potassium 3.5, BUN 29, creatinine 0.98 Albumin slightly low at 3.2 T bili slightly elevated at 1.5 CT abdomen shows small bowel obstruction pattern with multiple dilated loops of mid to small bowel and collapsed distal ileum with no discrete transition point Fentanyl given in the ED General surgery consulted and plans to see patient in the a.m. NG attempted and refused    Review of systems:    In addition to the HPI above,  No Fever-chills, No Headache, No changes with Vision or hearing, No problems swallowing food or Liquids, No Chest pain, Cough or Shortness of Breath, No Blood in stool or Urine, No dysuria, No new skin rashes or bruises, admits to chronic venous stasis changes of the lower extremities No new joints pains-aches,  No new weakness, tingling, numbness in any extremity, No recent weight gain or loss, No polyuria, polydypsia or polyphagia, No significant Mental Stressors.  All other systems reviewed and are negative.    Past History of the following :  Past Medical History:  Diagnosis Date   Anxiety 02/13/2016   Atrial flutter ([place]) 02/13/2016   s/p CTI ablation at 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   Sleep apnea 02/13/2016      Past Surgical History:  Procedure Laterality Date   A-FLUTTER ABLATION     at [facility] 2018   CARDIOVERSION  02/10/2018   CARDIOVERSION N/A 05/13/2018   Procedure: CARDIOVERSION;  Surgeon: Sanda Klein, MD;  Location: [facility];  Service: Cardiovascular;  Laterality: N/A;   CATARACT  EXTRACTION        Social History:      Social History   Tobacco Use   Smoking status: Every Day    Packs/day: 2.00    Years: 35.00    Pack years: 70.00    Types: Cigarettes    Start date: 05/28/1971   Smokeless tobacco: Never  Substance Use Topics   Alcohol use: No       Family History :     Family History  Problem Relation Age of Onset   Hypertension Father    Heart attack Father    Prostate cancer Father    Hypertension Sister    Hypertension Sister    Hypertension Sister    Epilepsy Son    Diabetes Son       Home Medications:   Prior to Admission medications   Medication Sig Start Date End Date Taking? Authorizing Provider  acetaminophen (TYLENOL) 500 MG tablet Take 500 mg by mouth every 6 (six) hours as needed.    [provider]  albuterol (VENTOLIN HFA) 108 (90 Base) MCG/ACT inhaler Inhale 1 puff into the lungs every 6 (six) hours as needed. 11/10/19   [provider]  benazepril (LOTENSIN) 10 MG tablet Take 1 tablet (10 mg total) by mouth daily. 07/13/20   Allred, Jeneen Rinks, MD  doxycycline (VIBRAMYCIN) 100 MG capsule Take 100 mg by mouth daily. 02/25/20   [provider]  ketoconazole (NIZORAL) 2 % cream Apply 1 application topically 2 (two) times daily as needed for irritation. 03/27/20   [provider]  Metoprolol Tartrate 75 MG TABS TAKE 1 TABLET TWICE A DAY 06/27/20   Arnoldo Lenis, MD  simvastatin (ZOCOR) 20 MG tablet TAKE 1 TABLET BY MOUTH DAILY AT 6 PM. 09/21/20   Branch, Alphonse Guild, MD  tamsulosin (FLOMAX) 0.4 MG CAPS capsule Take by mouth daily. 07/23/17   [provider]  torsemide (DEMADEX) 20 MG tablet Take 3 tablets (60 mg total) by mouth 2 (two) times daily. 07/13/20   Allred, Jeneen Rinks, MD  TRELEGY ELLIPTA 100-62.5-25 MCG/INH AEPB Inhale 1 puff into the lungs daily. 04/02/20   [provider]  triamcinolone (KENALOG) 0.1 % Apply 1 application topically 3 (three) times daily. 02/25/20   [provider]  warfarin (COUMADIN) 10 MG tablet TAKE 1/2 TO 1 TABLET DAILY AS DIRECTED BY COUMADIN CLINIC. 05/31/20   Arnoldo Lenis, MD     Allergies:     Allergies  Allergen Reactions   Celebrex [Celecoxib]    Methotrexate Derivatives Other (See Comments)    Arrhythmia    Sulfa Antibiotics      Physical Exam:   Vitals  Blood pressure (!) 103/58, pulse  67, temperature 98 F (36.7 C), temperature source Oral, resp. rate (!) 23, height '6\' 1"'$  (1.854 m), weight (!) 168 kg, SpO2 100 %.  1.  General: Patient lying supine in bed with head of bed elevated,  no acute distress   2. Psychiatric: Alert and oriented x 3, mood and behavior normal for situation, pleasant and cooperative with exam, poor insight to her own health care   3. Neurologic: Speech and language are normal, face is symmetric, moves all 4 extremities voluntarily, at baseline without acute deficits on limited exam   4. HEENMT:  Head is atraumatic, normocephalic, pupils reactive to light, neck is supple, trachea is midline, mucous membranes are moist   5. Respiratory : Lungs are clear to auscultation bilaterally without wheezing, rhonchi, rales, no cyanosis, no increase in work of breathing or accessory muscle use   6. Cardiovascular : Heart rate normal, rhythm is regular, no murmurs, rubs or gallops, with venous stasis changes and peripheral edema to the bilateral lower extremities  7. Gastrointestinal:  Abdomen is soft, mildly distended, not currently tender to palpation, no masses or organomegaly palpated  Dark-colored urine in urinal   8. Skin:  Skin is warm, dry and intact, significant venous stasis changes to the lower extremities bilaterally  9.Musculoskeletal:  No acute deformities or trauma, no asymmetry in tone,  peripheral pulses palpated, no tenderness to palpation in the extremities, intact sensation of the lower extremities bilaterally     Data Review:    CBC Recent Labs  Lab  11/20/20 1550  WBC 9.0  HGB 13.5  HCT 41.6  PLT 192  MCV 93.9  MCH 30.5  MCHC 32.5  RDW 15.2  LYMPHSABS 0.7  MONOABS 0.6  EOSABS 0.4  BASOSABS 0.1   ------------------------------------------------------------------------------------------------------------------  Results for orders placed or performed during the hospital encounter of 11/20/20 (from the past 48 hour(s))  Resp Panel by RT-PCR (Flu A&B, Covid) Nasopharyngeal Swab     Status: None   Collection Time: 11/20/20  3:31 PM   Specimen: Nasopharyngeal Swab; Nasopharyngeal(NP) swabs in vial transport medium  Result Value Ref Range   SARS Coronavirus 2 by RT PCR NEGATIVE NEGATIVE    Comment: (NOTE) SARS-CoV-2 target nucleic acids are NOT DETECTED.  The SARS-CoV-2 RNA is generally detectable in upper respiratory specimens during the acute phase of infection. The lowest concentration of SARS-CoV-2 viral copies this assay can detect is 138 copies/mL. A negative result does not preclude SARS-Cov-2 infection and should not be used as the sole basis for treatment or other patient management decisions. A negative result may occur with  improper specimen collection/handling, submission of specimen other than nasopharyngeal swab, presence of viral mutation(s) within the areas targeted by this assay, and inadequate number of viral copies(<138 copies/mL). A negative result must be combined with clinical observations, patient history, and epidemiological information. The expected result is Negative.  Fact Sheet for Patients:  EntrepreneurPulse.com.au  Fact Sheet for Healthcare Providers:  IncredibleEmployment.be  This test is no t yet approved or cleared by the [place] FDA and  has been authorized for detection and/or diagnosis of SARS-CoV-2 by FDA under an Emergency Use Authorization (EUA). This EUA will remain  in effect (meaning this test can be used) for the duration of  the COVID-19 declaration under Section 564(b)(1) of the Act, 21 U.S.C.section 360bbb-3(b)(1), unless the authorization is terminated  or revoked sooner.       Influenza A by PCR NEGATIVE NEGATIVE   Influenza B by PCR NEGATIVE NEGATIVE  Comment: (NOTE) The Xpert Xpress SARS-CoV-2/FLU/RSV plus assay is intended as an aid in the diagnosis of influenza from Nasopharyngeal swab specimens and should not be used as a sole basis for treatment. Nasal washings and aspirates are unacceptable for Xpert Xpress SARS-CoV-2/FLU/RSV testing.  Fact Sheet for Patients: EntrepreneurPulse.com.au  Fact Sheet for Healthcare Providers: IncredibleEmployment.be  This test is not yet approved or cleared by the [place] FDA and has been authorized for detection and/or diagnosis of SARS-CoV-2 by FDA under an Emergency Use Authorization (EUA). This EUA will remain in effect (meaning this test can be used) for the duration of the COVID-19 declaration under Section 564(b)(1) of the Act, 21 U.S.C. section 360bbb-3(b)(1), unless the authorization is terminated or revoked.  Performed at [facility], [address], [place], [place] [address]   Comprehensive metabolic panel     Status: Abnormal   Collection Time: 11/20/20  3:50 PM  Result Value Ref Range   Sodium 132 (L) 135 - 145 mmol/L   Potassium 3.5 3.5 - 5.1 mmol/L   Chloride 93 (L) 98 - 111 mmol/L   CO2 28 22 - 32 mmol/L   Glucose, Bld 115 (H) 70 - 99 mg/dL    Comment: Glucose reference range applies only to samples taken after fasting for at least 8 hours.   BUN 29 (H) 8 - 23 mg/dL   Creatinine, Ser 0.98 0.61 - 1.24 mg/dL   Calcium 8.4 (L) 8.9 - 10.3 mg/dL   Total Protein 6.8 6.5 - 8.1 g/dL   Albumin 3.2 (L) 3.5 - 5.0 g/dL   AST 32 15 - 41 U/L   ALT 18 0 - 44 U/L   Alkaline Phosphatase 78 38 - 126 U/L   Total Bilirubin 1.5 (H) 0.3 - 1.2 mg/dL   GFR, Estimated >60 >60 mL/min    Comment:  (NOTE) Calculated using the CKD-EPI Creatinine Equation (2021)    Anion gap 11 5 - 15    Comment: Performed at [facility], [address], [place], [place] [address]  Lipase, blood     Status: None   Collection Time: 11/20/20  3:50 PM  Result Value Ref Range   Lipase 21 11 - 51 U/L    Comment: Performed at [facility], [address], [place],  [address]  CBC with Differential     Status: None   Collection Time: 11/20/20  3:50 PM  Result Value Ref Range   WBC 9.0 4.0 - 10.5 K/uL   RBC 4.43 4.22 - 5.81 MIL/uL   Hemoglobin 13.5 13.0 - 17.0 g/dL   HCT 41.6 39.0 - 52.0 %   MCV 93.9 80.0 - 100.0 fL   MCH 30.5 26.0 - 34.0 pg   MCHC 32.5 30.0 - 36.0 g/dL   RDW 15.2 11.5 - 15.5 %   Platelets 192 150 - 400 K/uL   nRBC 0.0 0.0 - 0.2 %   Neutrophils Relative % 79 %   Neutro Abs 7.1 1.7 - 7.7 K/uL   Lymphocytes Relative 8 %   Lymphs Abs 0.7 0.7 - 4.0 K/uL   Monocytes Relative 7 %   Monocytes Absolute 0.6 0.1 - 1.0 K/uL   Eosinophils Relative 5 %   Eosinophils Absolute 0.4 0.0 - 0.5 K/uL   Basophils Relative 1 %   Basophils Absolute 0.1 0.0 - 0.1 K/uL   Immature Granulocytes 0 %   Abs Immature Granulocytes 0.03 0.00 - 0.07 K/uL    Comment: Performed at [facility], [address]  [place], [place] [address]    Chemistries  Recent Labs  Lab 11/20/20 1550  NA 132*  K 3.5  CL 93*  CO2 28  GLUCOSE 115*  BUN 29*  CREATININE 0.98  CALCIUM 8.4*  AST 32  ALT 18  ALKPHOS 78  BILITOT 1.5*   ------------------------------------------------------------------------------------------------------------------  ------------------------------------------------------------------------------------------------------------------ GFR: Estimated Creatinine Clearance: 114.2 mL/min (by C-G formula based on SCr of 0.98 mg/dL). Liver Function Tests: Recent Labs  Lab 11/20/20 1550  AST 32  ALT 18  ALKPHOS 78  BILITOT 1.5*  PROT 6.8  ALBUMIN 3.2*   Recent Labs  Lab 11/20/20 1550   LIPASE 21   No results for input(s): AMMONIA in the last 168 hours. Coagulation Profile: Recent Labs  Lab 11/15/20 0917  INR 3.5*   Cardiac Enzymes: No results for input(s): CKTOTAL, CKMB, CKMBINDEX, TROPONINI in the last 168 hours. BNP (last 3 results) No results for input(s): PROBNP in the last 8760 hours. HbA1C: No results for input(s): HGBA1C in the last 72 hours. CBG: No results for input(s): GLUCAP in the last 168 hours. Lipid Profile: No results for input(s): CHOL, HDL, LDLCALC, TRIG, CHOLHDL, LDLDIRECT in the last 72 hours. Thyroid Function Tests: No results for input(s): TSH, T4TOTAL, FREET4, T3FREE, THYROIDAB in the last 72 hours. Anemia Panel: No results for input(s): VITAMINB12, FOLATE, FERRITIN, TIBC, IRON, RETICCTPCT in the last 72 hours.  --------------------------------------------------------------------------------------------------------------- Urine analysis: No results found for: COLORURINE, APPEARANCEUR, LABSPEC, PHURINE, GLUCOSEU, HGBUR, BILIRUBINUR, KETONESUR, PROTEINUR, UROBILINOGEN, NITRITE, LEUKOCYTESUR    Imaging Results:    CT ABDOMEN PELVIS W CONTRAST  Result Date: 11/20/2020 CLINICAL DATA:  Acute abdominal pain.  Vomiting. EXAM: CT ABDOMEN AND PELVIS WITH CONTRAST TECHNIQUE: Multidetector CT imaging of the abdomen and pelvis was performed using the standard protocol following bolus administration of intravenous contrast. CONTRAST:  174m OMNIPAQUE IOHEXOL 350 MG/ML SOLN COMPARISON:  CT abdomen 04/22/2018 FINDINGS: Lower chest: Atelectasis LEFT lung base. Elevated LEFT hemidiaphragm diaphragm Hepatobiliary: Simple fluid attenuation lesion in the LEFT hepatic lobe not changed from prior. Probable collection of small benign cysts. No biliary duct dilatation. Gallbladder normal. Suggestion of sludge within the gallbladder. Pancreas: Pancreas is normal. No ductal dilatation. No pancreatic inflammation. Spleen: Normal spleen Adrenals/urinary tract: Adrenal  glands and kidneys are normal. The ureters and bladder normal. Stomach/Bowel: Stomach is normal.  Duodenum is normal. There is laxity of the abdominal wall such that the entirety of the abdomen is not imaged. There are dilated loops of small bowel within the mid jejunum and ileum measuring up to 5.4 cm (coronal image 23/series 5) the small bowel is collapsed leading up to the terminal ileum. Appendix normal. No transitional point is identified No pneumatosis or portal venous gas. Small volume stool in the colon. Gas throughout colon. Gas in the rectum. Vascular/Lymphatic: Abdominal aorta is normal caliber with atherosclerotic calcification. There is no retroperitoneal or periportal lymphadenopathy. No pelvic lymphadenopathy. Reproductive: Prostate un remark Other: No intraperitoneal free air.  No intraperitoneal free fluid Musculoskeletal: No aggressive osseous lesion. IMPRESSION: 1. Small bowel obstruction pattern with multiple dilated loops of mid small bowel and collapsed distal ileum. No discrete transition point identified. 2. No pneumatosis or portal venous gas identified.  No perforation 3. Stool and gas in the colon suggests early obstruction. Electronically Signed   By: SSuzy BouchardM.D.   On: 11/20/2020 17:32       Assessment & Plan:    Principal Problem:   SBO (small bowel obstruction) (HCC) Active Problems:   Sleep apnea  Atrial fibrillation (HCC)   Obesity, Class III, BMI 40-49.9 (morbid obesity) ([place])   Tobacco use disorder   Mild protein-calorie malnutrition ([place])   SBO NG tube attempted and refused Keep patient n.p.o. Pain control with oxycodone and morphine General surgery plans to see in the a.m. Sleep apnea Okay to use home unit Secondary to obesity Obesity class III As stated in HPI extensive conversations regarding diet, and healthy lifestyle choices Tobacco use disorder Nicotine patch Counseled on the importance of cessation Patient is in precontemplative  stage Mild protein calorie malnutrition Counseled on the importance of nutrient dense foods Atrial fibrillation Holding warfarin for now in case of surgical intervention for SBO Continue metoprolol Chronic respiratory failure Secondary to CHF and COPD Continue 4 L baseline oxygen requirement Continue home medications   DVT Prophylaxis-   Heparin- SCDs   AM Labs Ordered, also please review Full Orders  Family Communication: No family at bedside Code Status: Full  Admission status: Inpatient :The appropriate admission status for this patient is INPATIENT. Inpatient status is judged to be reasonable and necessary in order to provide the required intensity of service to ensure the patient's safety. The patient's presenting symptoms, physical exam findings, and initial radiographic and laboratory data in the context of their chronic comorbidities is felt to place them at high risk for further clinical deterioration. Furthermore, it is not anticipated that the patient will be medically stable for discharge from the hospital within 2 midnights of admission. The following factors support the admission status of inpatient.     The patient's presenting symptoms include abdominal pain. The worrisome physical exam findings include abdominal distention. The initial radiographic and laboratory data are worrisome because of SBO. The chronic co-morbidities include tobacco use disorder, congestive heart failure, A. fib, chronic respiratory failure.       * I certify that at the point of admission it is my clinical judgment that the patient will require inpatient hospital care spanning beyond 2 midnights from the point of admission due to high intensity of service, high risk for further deterioration and high frequency of surveillance required.*  Time spent in minutes : [place]

## 2020-11-22 ENCOUNTER — Inpatient Hospital Stay (HOSPITAL_COMMUNITY): Payer: Medicare Other

## 2020-11-22 ENCOUNTER — Observation Stay (HOSPITAL_COMMUNITY): Payer: Medicare Other

## 2020-11-22 DIAGNOSIS — N179 Acute kidney failure, unspecified: Secondary | ICD-10-CM | POA: Diagnosis not present

## 2020-11-22 DIAGNOSIS — M19041 Primary osteoarthritis, right hand: Secondary | ICD-10-CM | POA: Diagnosis present

## 2020-11-22 DIAGNOSIS — I959 Hypotension, unspecified: Secondary | ICD-10-CM | POA: Diagnosis not present

## 2020-11-22 DIAGNOSIS — M069 Rheumatoid arthritis, unspecified: Secondary | ICD-10-CM | POA: Diagnosis present

## 2020-11-22 DIAGNOSIS — E876 Hypokalemia: Secondary | ICD-10-CM | POA: Diagnosis present

## 2020-11-22 DIAGNOSIS — E869 Volume depletion, unspecified: Secondary | ICD-10-CM | POA: Diagnosis not present

## 2020-11-22 DIAGNOSIS — Z20822 Contact with and (suspected) exposure to covid-19: Secondary | ICD-10-CM | POA: Diagnosis present

## 2020-11-22 DIAGNOSIS — I5032 Chronic diastolic (congestive) heart failure: Secondary | ICD-10-CM | POA: Diagnosis present

## 2020-11-22 DIAGNOSIS — J449 Chronic obstructive pulmonary disease, unspecified: Secondary | ICD-10-CM | POA: Diagnosis present

## 2020-11-22 DIAGNOSIS — E871 Hypo-osmolality and hyponatremia: Secondary | ICD-10-CM | POA: Diagnosis present

## 2020-11-22 DIAGNOSIS — G4733 Obstructive sleep apnea (adult) (pediatric): Secondary | ICD-10-CM | POA: Diagnosis present

## 2020-11-22 DIAGNOSIS — J9611 Chronic respiratory failure with hypoxia: Secondary | ICD-10-CM | POA: Diagnosis present

## 2020-11-22 DIAGNOSIS — K56609 Unspecified intestinal obstruction, unspecified as to partial versus complete obstruction: Secondary | ICD-10-CM | POA: Diagnosis present

## 2020-11-22 DIAGNOSIS — E441 Mild protein-calorie malnutrition: Secondary | ICD-10-CM | POA: Diagnosis present

## 2020-11-22 DIAGNOSIS — E782 Mixed hyperlipidemia: Secondary | ICD-10-CM | POA: Diagnosis present

## 2020-11-22 DIAGNOSIS — I4892 Unspecified atrial flutter: Secondary | ICD-10-CM | POA: Diagnosis present

## 2020-11-22 DIAGNOSIS — F419 Anxiety disorder, unspecified: Secondary | ICD-10-CM | POA: Diagnosis present

## 2020-11-22 DIAGNOSIS — I4891 Unspecified atrial fibrillation: Secondary | ICD-10-CM | POA: Diagnosis not present

## 2020-11-22 DIAGNOSIS — F17210 Nicotine dependence, cigarettes, uncomplicated: Secondary | ICD-10-CM | POA: Diagnosis present

## 2020-11-22 DIAGNOSIS — Z6841 Body Mass Index (BMI) 40.0 and over, adult: Secondary | ICD-10-CM | POA: Diagnosis not present

## 2020-11-22 DIAGNOSIS — Z9981 Dependence on supplemental oxygen: Secondary | ICD-10-CM | POA: Diagnosis not present

## 2020-11-22 DIAGNOSIS — K56600 Partial intestinal obstruction, unspecified as to cause: Secondary | ICD-10-CM | POA: Diagnosis present

## 2020-11-22 DIAGNOSIS — I4819 Other persistent atrial fibrillation: Secondary | ICD-10-CM | POA: Diagnosis present

## 2020-11-22 DIAGNOSIS — I110 Hypertensive heart disease with heart failure: Secondary | ICD-10-CM | POA: Diagnosis present

## 2020-11-22 DIAGNOSIS — N170 Acute kidney failure with tubular necrosis: Secondary | ICD-10-CM | POA: Diagnosis not present

## 2020-11-22 LAB — CBC
HCT: 39.7 % (ref 39.0–52.0)
Hemoglobin: 12.6 g/dL — ABNORMAL LOW (ref 13.0–17.0)
MCH: 30.3 pg (ref 26.0–34.0)
MCHC: 31.7 g/dL (ref 30.0–36.0)
MCV: 95.4 fL (ref 80.0–100.0)
Platelets: 158 10*3/uL (ref 150–400)
RBC: 4.16 MIL/uL — ABNORMAL LOW (ref 4.22–5.81)
RDW: 14.8 % (ref 11.5–15.5)
WBC: 8.6 10*3/uL (ref 4.0–10.5)
nRBC: 0 % (ref 0.0–0.2)

## 2020-11-22 LAB — COMPREHENSIVE METABOLIC PANEL
ALT: 18 U/L (ref 0–44)
AST: 20 U/L (ref 15–41)
Albumin: 2.9 g/dL — ABNORMAL LOW (ref 3.5–5.0)
Alkaline Phosphatase: 73 U/L (ref 38–126)
Anion gap: 8 (ref 5–15)
BUN: 20 mg/dL (ref 8–23)
CO2: 32 mmol/L (ref 22–32)
Calcium: 8.4 mg/dL — ABNORMAL LOW (ref 8.9–10.3)
Chloride: 97 mmol/L — ABNORMAL LOW (ref 98–111)
Creatinine, Ser: 0.84 mg/dL (ref 0.61–1.24)
GFR, Estimated: >60 mL/min (ref >60)
Glucose, Bld: 91 mg/dL (ref 70–99)
Potassium: 3.4 mmol/L — ABNORMAL LOW (ref 3.5–5.1)
Sodium: 137 mmol/L (ref 135–145)
Total Bilirubin: 1.6 mg/dL — ABNORMAL HIGH (ref 0.3–1.2)
Total Protein: 6.2 g/dL — ABNORMAL LOW (ref 6.5–8.1)

## 2020-11-22 LAB — PROTIME-INR
INR: 1.5 — ABNORMAL HIGH (ref 0.8–1.2)
Prothrombin Time: 18.2 seconds — ABNORMAL HIGH (ref 11.4–15.2)

## 2020-11-22 LAB — MAGNESIUM: Magnesium: 2 mg/dL (ref 1.7–2.4)

## 2020-11-22 MED ORDER — POTASSIUM CHLORIDE 10 MEQ/100ML IV SOLN
10.0000 meq | INTRAVENOUS | Status: AC
  Administered 2020-11-22 (×4): 10 meq via INTRAVENOUS
  Filled 2020-11-22: qty 100

## 2020-11-22 MED ORDER — METOPROLOL TARTRATE 5 MG/5ML IV SOLN: 10.0000 mg | Freq: Four times a day (QID) | INTRAVENOUS | Status: DC

## 2020-11-22 MED ORDER — DIATRIZOATE MEGLUMINE & SODIUM 66-10 % PO SOLN
ORAL | Status: AC
  Filled 2020-11-22: qty 90

## 2020-11-22 MED ORDER — DIATRIZOATE MEGLUMINE & SODIUM 66-10 % PO SOLN
90.0000 mL | Freq: Once | ORAL | Status: AC
  Administered 2020-11-22: 90 mL via NASOGASTRIC
  Filled 2020-11-22: qty 90

## 2020-11-22 MED ORDER — LIDOCAINE HCL URETHRAL/MUCOSAL 2 % EX GEL
1.0000 "application " | Freq: Once | CUTANEOUS | Status: DC
  Filled 2020-11-22: qty 5

## 2020-11-22 NOTE — Progress Notes (Signed)
   11/22/20 0756  ECG Monitoring  CV Strip Heart Rate 144 (Dani RN notified.)  Pt being bathed and up to bedside commode, asymptomatic

## 2020-11-22 NOTE — Progress Notes (Addendum)
Subjective: Patient requested NG tube placement as he felt a little nauseated.  He states he has not passed much flatus.  No abdominal pain noted.  Objective: Vital signs in last 24 hours: Temp:  [97.5 F (36.4 C)-98.5 F (36.9 C)] 97.5 F (36.4 C) (08/11 0606) Pulse Rate:  [73-93] 92 (08/11 0606) Resp:  [18-24] 19 (08/11 0606) BP: (81-128)/(49-79) 103/49 (08/11 0606) SpO2:  [90 %-100 %] 97 % (08/11 0606) Last BM Date: 11/21/20  Intake/Output from previous day: 08/10 0701 - 08/11 0700 In: 1487.9 [I.V.:887.9] Out: -  Intake/Output this shift: No intake/output data recorded.  General appearance: alert, cooperative, and no distress GI: Soft, nontender.  Difficult to assess for abdominal distention due to body habitus.  Occasional bowel sounds appreciated.  Lab Results:  Recent Labs    11/21/20 0431 11/22/20 0500  WBC 7.6 8.6  HGB 12.3* 12.6*  HCT 38.4* 39.7  PLT 176 158   BMET Recent Labs    11/21/20 0431 11/22/20 0500  NA 136 137  K 3.1* 3.4*  CL 95* 97*  CO2 32 32  GLUCOSE 92 91  BUN 24* 20  CREATININE 0.87 0.84  CALCIUM 8.3* 8.4*   PT/INR Recent Labs    11/21/20 0431 11/22/20 0500  LABPROT 21.9* 18.2*  INR 1.9* 1.5*    Studies/Results: DG Abd 1 View  Result Date: 11/21/2020 CLINICAL DATA:  NG tube placement EXAM: ABDOMEN - 1 VIEW COMPARISON:  11/21/2020 FINDINGS: NG tube in the upper abdomen, NG tube tip in the fundus of the stomach. Dilated bowel loops again noted as seen on CT compatible with small bowel obstruction. IMPRESSION: NG tube tip in the fundus of the stomach. Electronically Signed   By: Rolm Baptise M.D.   On: 11/21/2020 19:09   DG Abd 1 View  Result Date: 11/21/2020 CLINICAL DATA:  NG tube placement EXAM: ABDOMEN - 1 VIEW COMPARISON:  11/21/2020 FINDINGS: NG tube tip is in the upper thoracic esophagus near the thoracic inlet. Cardiomegaly. Bibasilar atelectasis. Small left effusion. IMPRESSION: NG tube tip in the upper thoracic  esophagus near the thoracic inlet. Electronically Signed   By: Rolm Baptise M.D.   On: 11/21/2020 18:05   DG Abd 1 View  Result Date: 11/21/2020 CLINICAL DATA:  70 year old male with small bowel obstruction. EXAM: ABDOMEN - 1 VIEW COMPARISON:  CT Abdomen and Pelvis 11/20/2020. FINDINGS: Portable AP supine view at 0443 hours. Small-bowel obstruction pattern persists, and has not significantly changed. Some of the gas-filled small bowel loops may be slightly less dilated since yesterday. Excreted IV contrast in the urinary bladder. No enteric tube identified. Stable elevated left hemidiaphragm. No acute osseous abnormality identified. IMPRESSION: Small-bowel obstruction gas pattern not significantly changed since yesterday. Electronically Signed   By: Genevie Ann M.D.   On: 11/21/2020 06:09   CT ABDOMEN PELVIS W CONTRAST  Result Date: 11/20/2020 CLINICAL DATA:  Acute abdominal pain.  Vomiting. EXAM: CT ABDOMEN AND PELVIS WITH CONTRAST TECHNIQUE: Multidetector CT imaging of the abdomen and pelvis was performed using the standard protocol following bolus administration of intravenous contrast. CONTRAST:  130m OMNIPAQUE IOHEXOL 350 MG/ML SOLN COMPARISON:  CT abdomen 04/22/2018 FINDINGS: Lower chest: Atelectasis LEFT lung base. Elevated LEFT hemidiaphragm diaphragm Hepatobiliary: Simple fluid attenuation lesion in the LEFT hepatic lobe not changed from prior. Probable collection of small benign cysts. No biliary duct dilatation. Gallbladder normal. Suggestion of sludge within the gallbladder. Pancreas: Pancreas is normal. No ductal dilatation. No pancreatic inflammation. Spleen: Normal spleen Adrenals/urinary tract: Adrenal  glands and kidneys are normal. The ureters and bladder normal. Stomach/Bowel: Stomach is normal.  Duodenum is normal. There is laxity of the abdominal wall such that the entirety of the abdomen is not imaged. There are dilated loops of small bowel within the mid jejunum and ileum measuring up to  5.4 cm (coronal image 23/series 5) the small bowel is collapsed leading up to the terminal ileum. Appendix normal. No transitional point is identified No pneumatosis or portal venous gas. Small volume stool in the colon. Gas throughout colon. Gas in the rectum. Vascular/Lymphatic: Abdominal aorta is normal caliber with atherosclerotic calcification. There is no retroperitoneal or periportal lymphadenopathy. No pelvic lymphadenopathy. Reproductive: Prostate un remark Other: No intraperitoneal free air.  No intraperitoneal free fluid Musculoskeletal: No aggressive osseous lesion. IMPRESSION: 1. Small bowel obstruction pattern with multiple dilated loops of mid small bowel and collapsed distal ileum. No discrete transition point identified. 2. No pneumatosis or portal venous gas identified.  No perforation 3. Stool and gas in the colon suggests early obstruction. Electronically Signed   By: Suzy Bouchard M.D.   On: 11/20/2020 17:32    Anti-infectives: Anti-infectives (From admission, onward)    None       Assessment/Plan: Impression: Small bowel obstruction.  NG tube has been placed. Plan: We will get small bowel follow-through and see whether patient has a complete obstruction.  Further management is pending those results.  Hypokalemia being addressed.  LOS: 1 day    Aviva Signs 11/22/2020

## 2020-11-22 NOTE — Progress Notes (Signed)
PROGRESS NOTE    Jesus Rodriguez  [id number] DOB: 04/18/1950 DOA: 11/20/2020 PCP: Patient, [name] (Inactive)     Brief Narrative:  Jesus Rodriguez  is a 70 y.o. male, with history of anxiety, congestive heart failure, atrial flutter, degenerative disc disease, essential hypertension, mixed hyperlipidemia, persistent atrial fibrillation, rheumatoid arthritis, sleep apnea and more presents to ED with a chief complaint of abdominal pain.  Findings consistent with SBO.   New events last 24 hours / Subjective: Patient had worsening nausea yesterday afternoon, agreeable to NG tube which was placed yesterday.  He states that he had a bowel movement this morning, has been passing gas.  No nausea.  Assessment & Plan:   Principal Problem:   SBO (small bowel obstruction) (HCC) Active Problems:   Sleep apnea   Atrial fibrillation (HCC)   Obesity, Class III, BMI 40-49.9 (morbid obesity) ([facility])   Tobacco use disorder   Mild protein-calorie malnutrition (HCC)   SBO -General surgery following -Continue NG tube, IVF, NPO  -SBO protocol in process  Chronic hypoxemic respiratory failure -Uses 4 L O2 at baseline  COPD -Continue breo/incruse   Hypertension -Benazepril and metoprolol on hold due to hypotension  Hyperlipidemia -Hold Zocor while n.p.o.  BPH -Hold Flomax while n.p.o.  Tobacco abuse -Cessation counseling -Nicotine patch   Hypokalemia -Replace, trend  History of atrial fibrillation/flutter -Coumadin on hold  Chronic diastolic heart failure -Demadex on due to hypotension  OSA -CPAP qhs      DVT prophylaxis: Coumadin PTA heparin injection 5,000 Units Start: 11/20/20 2315 SCDs Start: 11/20/20 2229  Code Status:     Code Status Orders  (From admission, onward)           Start     Ordered   11/20/20 2229  Full code  Continuous        11/20/20 2228           Code Status History     Date Active Date Inactive Code Status Order ID Comments  User Context   11/29/2019 0936 11/30/2019 2203 Full Code [id number]  Satira Sark, MD Inpatient   11/27/2019 1712 11/29/2019 0936 Full Code [id number]  Vashti Hey, MD ED   05/11/2018 1645 05/14/2018 1753 Full Code [id number]  Oliver Barre, PA Inpatient      Advance Directive Documentation    Flowsheet Row Most Recent Value  Type of Advance Directive Living will  Pre-existing out of facility DNR order (yellow form or pink MOST form) --  "MOST" Form in Place? --      Family Communication: No family at bedside Disposition Plan:  Status is: Inpatient  Remains inpatient appropriate because:Inpatient level of care appropriate due to severity of illness  Dispo: The patient is from: Home              Anticipated d/c is to: Home              Patient currently is not medically stable to d/c.   Difficult to place patient No      Consultants:  General surgery  Procedures:  None  Antimicrobials:  Anti-infectives (From admission, onward)    None        Objective: Vitals:   11/21/20 1950 11/21/20 2121 11/22/20 0606 11/22/20 0833  BP:  (!) 96/59 (!) 103/49   Pulse:  85 92   Resp:  18 19   Temp:  98.5 F (36.9 C) (!) 97.5 F (36.4 C)   TempSrc:   Oral  SpO2: 99% 97% 97% 96%  Weight:      Height:        Intake/Output Summary (Last 24 hours) at 11/22/2020 1153 Last data filed at 11/22/2020 0700 Gross per 24 hour  Intake 1487.94 ml  Output 1 ml  Net 1486.94 ml   Filed Weights   11/20/20 1639  Weight: (!) 168 kg    Examination:  General exam: Appears calm and comfortable  Respiratory system: Clear to auscultation. Respiratory effort normal. No respiratory distress. No conversational dyspnea.  Cardiovascular system: S1 & S2 heard, RRR. No murmurs. No pedal edema. Gastrointestinal system: Abdomen is mildly distended but nontender to palpation, soft, no bowel sounds heard Central nervous system: Alert and oriented. No focal neurological deficits.  Speech clear.  Extremities: Symmetric in appearance  Skin: No rashes, lesions or ulcers on exposed skin  Psychiatry: Judgement and insight appear normal. Mood & affect appropriate.   Data Reviewed: I have personally reviewed following labs and imaging studies  CBC: Recent Labs  Lab 11/20/20 1550 11/21/20 0431 11/22/20 0500  WBC 9.0 7.6 8.6  NEUTROABS 7.1 5.8  --   HGB 13.5 12.3* 12.6*  HCT 41.6 38.4* 39.7  MCV 93.9 94.1 95.4  PLT 192 176 0000000   Basic Metabolic Panel: Recent Labs  Lab 11/20/20 1550 11/21/20 0431 11/22/20 0500  NA 132* 136 137  K 3.5 3.1* 3.4*  CL 93* 95* 97*  CO2 28 32 32  GLUCOSE 115* 92 91  BUN 29* 24* 20  CREATININE 0.98 0.87 0.84  CALCIUM 8.4* 8.3* 8.4*  MG  --  2.0 2.0   GFR: Estimated Creatinine Clearance: 133.2 mL/min (by C-G formula based on SCr of 0.84 mg/dL). Liver Function Tests: Recent Labs  Lab 11/20/20 1550 11/21/20 0431 11/22/20 0500  AST 32 23 20  ALT '18 17 18  '$ ALKPHOS 78 70 73  BILITOT 1.5* 1.6* 1.6*  PROT 6.8 5.9* 6.2*  ALBUMIN 3.2* 2.8* 2.9*   Recent Labs  Lab 11/20/20 1550  LIPASE 21   No results for input(s): AMMONIA in the last 168 hours. Coagulation Profile: Recent Labs  Lab 11/21/20 0431 11/22/20 0500  INR 1.9* 1.5*   Cardiac Enzymes: No results for input(s): CKTOTAL, CKMB, CKMBINDEX, TROPONINI in the last 168 hours. BNP (last 3 results) No results for input(s): PROBNP in the last 8760 hours. HbA1C: No results for input(s): HGBA1C in the last 72 hours. CBG: No results for input(s): GLUCAP in the last 168 hours. Lipid Profile: No results for input(s): CHOL, HDL, LDLCALC, TRIG, CHOLHDL, LDLDIRECT in the last 72 hours. Thyroid Function Tests: No results for input(s): TSH, T4TOTAL, FREET4, T3FREE, THYROIDAB in the last 72 hours. Anemia Panel: No results for input(s): VITAMINB12, FOLATE, FERRITIN, TIBC, IRON, RETICCTPCT in the last 72 hours. Sepsis Labs: No results for input(s): PROCALCITON, LATICACIDVEN  in the last 168 hours.  Recent Results (from the past 240 hour(s))  Resp Panel by RT-PCR (Flu A&B, Covid) Nasopharyngeal Swab     Status: None   Collection Time: 11/20/20  3:31 PM   Specimen: Nasopharyngeal Swab; Nasopharyngeal(NP) swabs in vial transport medium  Result Value Ref Range Status   SARS Coronavirus 2 by RT PCR NEGATIVE NEGATIVE Final    Comment: (NOTE) SARS-CoV-2 target nucleic acids are NOT DETECTED.  The SARS-CoV-2 RNA is generally detectable in upper respiratory specimens during the acute phase of infection. The lowest concentration of SARS-CoV-2 viral copies this assay can detect is 138 copies/mL. A negative result does not preclude SARS-Cov-2  infection and should not be used as the sole basis for treatment or other patient management decisions. A negative result may occur with  improper specimen collection/handling, submission of specimen other than nasopharyngeal swab, presence of viral mutation(s) within the areas targeted by this assay, and inadequate number of viral copies(<138 copies/mL). A negative result must be combined with clinical observations, patient history, and epidemiological information. The expected result is Negative.  Fact Sheet for Patients:  EntrepreneurPulse.com.au  Fact Sheet for Healthcare Providers:  IncredibleEmployment.be  This test is no t yet approved or cleared by the [place] FDA and  has been authorized for detection and/or diagnosis of SARS-CoV-2 by FDA under an Emergency Use Authorization (EUA). This EUA will remain  in effect (meaning this test can be used) for the duration of the COVID-19 declaration under Section 564(b)(1) of the Act, 21 U.S.C.section 360bbb-3(b)(1), unless the authorization is terminated  or revoked sooner.       Influenza A by PCR NEGATIVE NEGATIVE Final   Influenza B by PCR NEGATIVE NEGATIVE Final    Comment: (NOTE) The Xpert Xpress SARS-CoV-2/FLU/RSV plus  assay is intended as an aid in the diagnosis of influenza from Nasopharyngeal swab specimens and should not be used as a sole basis for treatment. Nasal washings and aspirates are unacceptable for Xpert Xpress SARS-CoV-2/FLU/RSV testing.  Fact Sheet for Patients: EntrepreneurPulse.com.au  Fact Sheet for Healthcare Providers: IncredibleEmployment.be  This test is not yet approved or cleared by the [place] FDA and has been authorized for detection and/or diagnosis of SARS-CoV-2 by FDA under an Emergency Use Authorization (EUA). This EUA will remain in effect (meaning this test can be used) for the duration of the COVID-19 declaration under Section 564(b)(1) of the Act, 21 U.S.C. section 360bbb-3(b)(1), unless the authorization is terminated or revoked.  Performed at [facility] [address], [place], [place] [address]       Radiology Studies: DG Abd 1 View  Result Date: 11/22/2020 CLINICAL DATA:  Small-bowel obstruction. EXAM: ABDOMEN - 1 VIEW COMPARISON:  11/21/2020 and CT on 11/20/2020 FINDINGS: Nasogastric tube extends into the proximal stomach and terminates in the region of the fundus. Multiple dilated loops of small bowel again noted with similar caliber compared to the prior CT. Maximal small bowel caliber is approximately 5 cm. No evidence of free intraperitoneal air or pneumatosis. There is some sparse air scattered in the colon and findings are consistent with partial small bowel obstruction. IMPRESSION: 1. Stable nasogastric tube positioning with the tip terminating in the region of the fundus. 2. Similar appearance dilated small bowel and sparse air in the colon consistent with partial small bowel obstruction. No evidence of free air. Electronically Signed   By: Aletta Edouard M.D.   On: 11/22/2020 08:56   DG Abd 1 View  Result Date: 11/21/2020 CLINICAL DATA:  NG tube placement EXAM: ABDOMEN - 1 VIEW COMPARISON:  11/21/2020  FINDINGS: NG tube in the upper abdomen, NG tube tip in the fundus of the stomach. Dilated bowel loops again noted as seen on CT compatible with small bowel obstruction. IMPRESSION: NG tube tip in the fundus of the stomach. Electronically Signed   By: Rolm Baptise M.D.   On: 11/21/2020 19:09   DG Abd 1 View  Result Date: 11/21/2020 CLINICAL DATA:  NG tube placement EXAM: ABDOMEN - 1 VIEW COMPARISON:  11/21/2020 FINDINGS: NG tube tip is in the upper thoracic esophagus near the thoracic inlet. Cardiomegaly. Bibasilar atelectasis. Small left effusion. IMPRESSION: NG tube tip  in the upper thoracic esophagus near the thoracic inlet. Electronically Signed   By: Rolm Baptise M.D.   On: 11/21/2020 18:05   DG Abd 1 View  Result Date: 11/21/2020 CLINICAL DATA:  70 year old male with small bowel obstruction. EXAM: ABDOMEN - 1 VIEW COMPARISON:  CT Abdomen and Pelvis 11/20/2020. FINDINGS: Portable AP supine view at 0443 hours. Small-bowel obstruction pattern persists, and has not significantly changed. Some of the gas-filled small bowel loops may be slightly less dilated since yesterday. Excreted IV contrast in the urinary bladder. No enteric tube identified. Stable elevated left hemidiaphragm. No acute osseous abnormality identified. IMPRESSION: Small-bowel obstruction gas pattern not significantly changed since yesterday. Electronically Signed   By: Genevie Ann M.D.   On: 11/21/2020 06:09   CT ABDOMEN PELVIS W CONTRAST  Result Date: 11/20/2020 CLINICAL DATA:  Acute abdominal pain.  Vomiting. EXAM: CT ABDOMEN AND PELVIS WITH CONTRAST TECHNIQUE: Multidetector CT imaging of the abdomen and pelvis was performed using the standard protocol following bolus administration of intravenous contrast. CONTRAST:  172m OMNIPAQUE IOHEXOL 350 MG/ML SOLN COMPARISON:  CT abdomen 04/22/2018 FINDINGS: Lower chest: Atelectasis LEFT lung base. Elevated LEFT hemidiaphragm diaphragm Hepatobiliary: Simple fluid attenuation lesion in the LEFT  hepatic lobe not changed from prior. Probable collection of small benign cysts. No biliary duct dilatation. Gallbladder normal. Suggestion of sludge within the gallbladder. Pancreas: Pancreas is normal. No ductal dilatation. No pancreatic inflammation. Spleen: Normal spleen Adrenals/urinary tract: Adrenal glands and kidneys are normal. The ureters and bladder normal. Stomach/Bowel: Stomach is normal.  Duodenum is normal. There is laxity of the abdominal wall such that the entirety of the abdomen is not imaged. There are dilated loops of small bowel within the mid jejunum and ileum measuring up to 5.4 cm (coronal image 23/series 5) the small bowel is collapsed leading up to the terminal ileum. Appendix normal. No transitional point is identified No pneumatosis or portal venous gas. Small volume stool in the colon. Gas throughout colon. Gas in the rectum. Vascular/Lymphatic: Abdominal aorta is normal caliber with atherosclerotic calcification. There is no retroperitoneal or periportal lymphadenopathy. No pelvic lymphadenopathy. Reproductive: Prostate un remark Other: No intraperitoneal free air.  No intraperitoneal free fluid Musculoskeletal: No aggressive osseous lesion. IMPRESSION: 1. Small bowel obstruction pattern with multiple dilated loops of mid small bowel and collapsed distal ileum. No discrete transition point identified. 2. No pneumatosis or portal venous gas identified.  No perforation 3. Stool and gas in the colon suggests early obstruction. Electronically Signed   By: SSuzy BouchardM.D.   On: 11/20/2020 17:32      Scheduled Meds:  diatrizoate meglumine-sodium       fluticasone furoate-vilanterol  1 puff Inhalation Daily   And   umeclidinium bromide  1 puff Inhalation Daily   heparin  5,000 Units Subcutaneous Q8H   nicotine  21 mg Transdermal Daily   triamcinolone cream  1 application Topical TID   Continuous Infusions:  sodium chloride 75 mL/hr at 11/22/20 0741   potassium chloride 10  mEq (11/22/20 1034)     LOS: 1 day      Time spent: 30 minutes   JDessa Phi DO [facility] 11/22/2020, 11:53 AM   Available via Epic secure chat 7am-7pm After these hours, please refer to coverage provider listed on [email or web address]

## 2020-11-22 NOTE — Progress Notes (Signed)
Patient's NG tube is out and RT checked CPAP to make sure it was ready for use tonight. Patient states he is able to get on and off by himself. O2 connected and machine ready for use and within reach of patient.

## 2020-11-23 ENCOUNTER — Encounter (HOSPITAL_COMMUNITY): Payer: Self-pay | Admitting: Internal Medicine

## 2020-11-23 DIAGNOSIS — K56609 Unspecified intestinal obstruction, unspecified as to partial versus complete obstruction: Secondary | ICD-10-CM | POA: Diagnosis not present

## 2020-11-23 LAB — BASIC METABOLIC PANEL
Anion gap: 10 (ref 5–15)
BUN: 12 mg/dL (ref 8–23)
CO2: 29 mmol/L (ref 22–32)
Calcium: 8.4 mg/dL — ABNORMAL LOW (ref 8.9–10.3)
Chloride: 101 mmol/L (ref 98–111)
Creatinine, Ser: 0.81 mg/dL (ref 0.61–1.24)
GFR, Estimated: >60 mL/min (ref >60)
Glucose, Bld: 94 mg/dL (ref 70–99)
Potassium: 3.8 mmol/L (ref 3.5–5.1)
Sodium: 140 mmol/L (ref 135–145)

## 2020-11-23 LAB — MAGNESIUM: Magnesium: 2 mg/dL (ref 1.7–2.4)

## 2020-11-23 MED ORDER — TORSEMIDE 20 MG PO TABS
60.0000 mg | ORAL_TABLET | Freq: Two times a day (BID) | ORAL | Status: DC
  Administered 2020-11-23: 60 mg via ORAL
  Filled 2020-11-23: qty 3

## 2020-11-23 MED ORDER — BENAZEPRIL HCL 10 MG PO TABS
10.0000 mg | ORAL_TABLET | Freq: Every day | ORAL | Status: DC
  Administered 2020-11-23: 10 mg via ORAL
  Filled 2020-11-23: qty 1

## 2020-11-23 MED ORDER — LACTATED RINGERS IV SOLN: INTRAVENOUS | Status: DC

## 2020-11-23 MED ORDER — WARFARIN SODIUM 5 MG PO TABS: 5.0000 mg | ORAL_TABLET | ORAL | Status: DC

## 2020-11-23 MED ORDER — METOPROLOL TARTRATE 50 MG PO TABS
75.0000 mg | ORAL_TABLET | Freq: Two times a day (BID) | ORAL | Status: DC
  Administered 2020-11-23: 75 mg via ORAL
  Filled 2020-11-23: qty 1

## 2020-11-23 MED ORDER — WARFARIN - PHARMACIST DOSING INPATIENT: Freq: Every day | Status: DC

## 2020-11-23 MED ORDER — WARFARIN SODIUM 5 MG PO TABS
10.0000 mg | ORAL_TABLET | Freq: Once | ORAL | Status: AC
  Administered 2020-11-23: 10 mg via ORAL
  Filled 2020-11-23: qty 2

## 2020-11-23 MED ORDER — SIMVASTATIN 20 MG PO TABS
20.0000 mg | ORAL_TABLET | Freq: Every day | ORAL | Status: DC
  Administered 2020-11-23 – 2020-11-26 (×4): 20 mg via ORAL
  Filled 2020-11-23 (×4): qty 1

## 2020-11-23 NOTE — Progress Notes (Addendum)
PROGRESS NOTE    Jesus Rodriguez  [id number] DOB: [birth year] DOA: 11/20/2020 PCP: Patient, [name] (Inactive)     Brief Narrative:  Jesus Rodriguez  is a 70 y.o. male, with history of anxiety, congestive heart failure, atrial flutter, degenerative disc disease, essential hypertension, mixed hyperlipidemia, persistent atrial fibrillation, rheumatoid arthritis, sleep apnea and more presents to ED with a chief complaint of abdominal pain.  Findings consistent with SBO.   New events last 24 hours / Subjective: Reporting that he has had gas and 1 bowel movement.  Stating that NG tube accidentally was taken out Stating general surgery has seen him earlier this morning, ordering further work-up   Assessment & Plan:   Principal Problem:   SBO (small bowel obstruction) ([place]) Active Problems:   Sleep apnea   Atrial fibrillation (HCC)   Obesity, Class III, BMI 40-49.9 (morbid obesity) ([place])   Tobacco use disorder   Mild protein-calorie malnutrition ([place])   SBO -General surgery following -NG tube was accidentally discontinued, patient reporting gas and 1 BM -General surgery ordered small bowel studies -Per surgery initiating clear liquid diet advancing slowly  Ambulating patient  Chronic hypoxemic respiratory failure -Uses 4 L O2 at baseline, stable satting 98%  COPD -Continue breo/incruse  On 4 L satting 90%  Hypertension -Benazepril and metoprolol on hold due to hypotension Stable at this time, resuming above meds once tolerating p.o.  Hyperlipidemia -Resuming Zocor once tolerating p.o.  BPH -Resuming Flomax once tolerating p.o.  Tobacco abuse -Cessation counseling -Nicotine patch   Hypokalemia -Repleting with IV fluids Return  History of atrial fibrillation/flutter -Resuming home medication of Coumadin  Chronic diastolic heart failure -Demadex on due to hypotension  OSA -CPAP qhs      DVT prophylaxis: Coumadin PTA heparin injection 5,000 Units  Start: 11/20/20 2315 SCDs Start: 11/20/20 2229  Code Status: Full Code  Family Communication: No family at bedside Disposition Plan:  Status is: Inpatient  Remains inpatient appropriate because:Inpatient level of care appropriate due to severity of illness  Dispo: The patient is from: Home              Anticipated d/c is to: Home in AM               Patient currently is not medically stable to d/c.   Difficult to place patient No      Consultants:  General surgery  Procedures:  None  Antimicrobials:  Anti-infectives (From admission, onward)    None        Objective: Vitals:   11/23/20 0342 11/23/20 0537 11/23/20 0808 11/23/20 1300  BP:  111/60  (!) 141/70  Pulse:  98  88  Resp:  18  18  Temp:  98.5 F (36.9 C)  98.1 F (36.7 C)  TempSrc:    Oral  SpO2: (!) 4% 96% 96% 98%  Weight:      Height:        Intake/Output Summary (Last 24 hours) at 11/23/2020 1518 Last data filed at 11/23/2020 0700 Gross per 24 hour  Intake 1815.62 ml  Output --  Net 1815.62 ml   Filed Weights   11/20/20 1639  Weight: (!) 168 kg      Physical Exam:   General:  Alert, oriented, cooperative, no distress;   HEENT:  Normocephalic, PERRL, otherwise with in Normal limits   Neuro:  CNII-XII intact. , normal motor and sensation, reflexes intact   Lungs:   Clear to auscultation BL, Respirations unlabored, no wheezes /  crackles  Cardio:    S1/S2, RRR, No murmure, No Rubs or Gallops   Abdomen:   Soft distended abdomen, hypoactive bowel sounds, negative fluid shift no guarding or peritoneal signs.  Muscular skeletal:  Limited exam - in bed, able to move all 4 extremities, Normal strength,  2+ pulses,  symmetric, No pitting edema  Skin:  Dry, warm to touch, negative for any Rashes,  Wounds: Please see nursing documentation  Pressure Injury 11/28/19 Buttocks Left Stage 1 -  Intact skin with non-blanchable redness of a localized area usually over a bony prominence. (Active)   11/28/19 1330  Location: Buttocks  Location Orientation: Left  Staging: Stage 1 -  Intact skin with non-blanchable redness of a localized area usually over a bony prominence.  Wound Description (Comments):   Present on Admission: Yes     Pressure Injury 11/28/19 Buttocks Right Stage 1 -  Intact skin with non-blanchable redness of a localized area usually over a bony prominence. (Active)  11/28/19 1333  Location: Buttocks  Location Orientation: Right  Staging: Stage 1 -  Intact skin with non-blanchable redness of a localized area usually over a bony prominence.  Wound Description (Comments):   Present on Admission: Yes         Data Reviewed: I have personally reviewed following labs and imaging studies  CBC: Recent Labs  Lab 11/20/20 1550 11/21/20 0431 11/22/20 0500  WBC 9.0 7.6 8.6  NEUTROABS 7.1 5.8  --   HGB 13.5 12.3* 12.6*  HCT 41.6 38.4* 39.7  MCV 93.9 94.1 95.4  PLT 192 176 0000000   Basic Metabolic Panel: Recent Labs  Lab 11/20/20 1550 11/21/20 0431 11/22/20 0500 11/23/20 0459  NA 132* 136 137 140  K 3.5 3.1* 3.4* 3.8  CL 93* 95* 97* 101  CO2 28 32 32 29  GLUCOSE 115* 92 91 94  BUN 29* 24* 20 12  CREATININE 0.98 0.87 0.84 0.81  CALCIUM 8.4* 8.3* 8.4* 8.4*  MG  --  2.0 2.0 2.0   GFR: Estimated Creatinine Clearance: 138.2 mL/min (by C-G formula based on SCr of 0.81 mg/dL). Liver Function Tests: Recent Labs  Lab 11/20/20 1550 11/21/20 0431 11/22/20 0500  AST 32 23 20  ALT '18 17 18  '$ ALKPHOS 78 70 73  BILITOT 1.5* 1.6* 1.6*  PROT 6.8 5.9* 6.2*  ALBUMIN 3.2* 2.8* 2.9*   Recent Labs  Lab 11/20/20 1550  LIPASE 21   No results for input(s): AMMONIA in the last 168 hours. Coagulation Profile: Recent Labs  Lab 11/21/20 0431 11/22/20 0500  INR 1.9* 1.5*   Cardiac Enzymes: No results for input(s): CKTOTAL, CKMB, CKMBINDEX, TROPONINI in the last 168 hours. BNP (last 3 results) No results for input(s): PROBNP in the last 8760 hours. HbA1C: No  results for input(s): HGBA1C in the last 72 hours. CBG: No results for input(s): GLUCAP in the last 168 hours. Lipid Profile: No results for input(s): CHOL, HDL, LDLCALC, TRIG, CHOLHDL, LDLDIRECT in the last 72 hours. Thyroid Function Tests: No results for input(s): TSH, T4TOTAL, FREET4, T3FREE, THYROIDAB in the last 72 hours. Anemia Panel: No results for input(s): VITAMINB12, FOLATE, FERRITIN, TIBC, IRON, RETICCTPCT in the last 72 hours. Sepsis Labs: No results for input(s): PROCALCITON, LATICACIDVEN in the last 168 hours.  Recent Results (from the past 240 hour(s))  Resp Panel by RT-PCR (Flu A&B, Covid) Nasopharyngeal Swab     Status: None   Collection Time: 11/20/20  3:31 PM   Specimen: Nasopharyngeal Swab; Nasopharyngeal(NP) swabs in  vial transport medium  Result Value Ref Range Status   SARS Coronavirus 2 by RT PCR NEGATIVE NEGATIVE Final    Comment: (NOTE) SARS-CoV-2 target nucleic acids are NOT DETECTED.  The SARS-CoV-2 RNA is generally detectable in upper respiratory specimens during the acute phase of infection. The lowest concentration of SARS-CoV-2 viral copies this assay can detect is 138 copies/mL. A negative result does not preclude SARS-Cov-2 infection and should not be used as the sole basis for treatment or other patient management decisions. A negative result may occur with  improper specimen collection/handling, submission of specimen other than nasopharyngeal swab, presence of viral mutation(s) within the areas targeted by this assay, and inadequate number of viral copies(<138 copies/mL). A negative result must be combined with clinical observations, patient history, and epidemiological information. The expected result is Negative.  Fact Sheet for Patients:  EntrepreneurPulse.com.au  Fact Sheet for Healthcare Providers:  IncredibleEmployment.be  This test is no t yet approved or cleared by the [place] FDA and  has  been authorized for detection and/or diagnosis of SARS-CoV-2 by FDA under an Emergency Use Authorization (EUA). This EUA will remain  in effect (meaning this test can be used) for the duration of the COVID-19 declaration under Section 564(b)(1) of the Act, 21 U.S.C.section 360bbb-3(b)(1), unless the authorization is terminated  or revoked sooner.       Influenza A by PCR NEGATIVE NEGATIVE Final   Influenza B by PCR NEGATIVE NEGATIVE Final    Comment: (NOTE) The Xpert Xpress SARS-CoV-2/FLU/RSV plus assay is intended as an aid in the diagnosis of influenza from Nasopharyngeal swab specimens and should not be used as a sole basis for treatment. Nasal washings and aspirates are unacceptable for Xpert Xpress SARS-CoV-2/FLU/RSV testing.  Fact Sheet for Patients: EntrepreneurPulse.com.au  Fact Sheet for Healthcare Providers: IncredibleEmployment.be  This test is not yet approved or cleared by the [place] FDA and has been authorized for detection and/or diagnosis of SARS-CoV-2 by FDA under an Emergency Use Authorization (EUA). This EUA will remain in effect (meaning this test can be used) for the duration of the COVID-19 declaration under Section 564(b)(1) of the Act, 21 U.S.C. section 360bbb-3(b)(1), unless the authorization is terminated or revoked.  Performed at [facility], [address] [place], [place] [address]       Radiology Studies: DG Abd 1 View  Result Date: 11/22/2020 CLINICAL DATA:  Small-bowel obstruction. EXAM: ABDOMEN - 1 VIEW COMPARISON:  11/21/2020 and CT on 11/20/2020 FINDINGS: Nasogastric tube extends into the proximal stomach and terminates in the region of the fundus. Multiple dilated loops of small bowel again noted with similar caliber compared to the prior CT. Maximal small bowel caliber is approximately 5 cm. No evidence of free intraperitoneal air or pneumatosis. There is some sparse air scattered in the colon and  findings are consistent with partial small bowel obstruction. IMPRESSION: 1. Stable nasogastric tube positioning with the tip terminating in the region of the fundus. 2. Similar appearance dilated small bowel and sparse air in the colon consistent with partial small bowel obstruction. No evidence of free air. Electronically Signed   By: Aletta Edouard M.D.   On: 11/22/2020 08:56   DG Abd 1 View  Result Date: 11/21/2020 CLINICAL DATA:  NG tube placement EXAM: ABDOMEN - 1 VIEW COMPARISON:  11/21/2020 FINDINGS: NG tube in the upper abdomen, NG tube tip in the fundus of the stomach. Dilated bowel loops again noted as seen on CT compatible with small bowel obstruction. IMPRESSION: NG  tube tip in the fundus of the stomach. Electronically Signed   By: Rolm Baptise M.D.   On: 11/21/2020 19:09   DG Abd 1 View  Result Date: 11/21/2020 CLINICAL DATA:  NG tube placement EXAM: ABDOMEN - 1 VIEW COMPARISON:  11/21/2020 FINDINGS: NG tube tip is in the upper thoracic esophagus near the thoracic inlet. Cardiomegaly. Bibasilar atelectasis. Small left effusion. IMPRESSION: NG tube tip in the upper thoracic esophagus near the thoracic inlet. Electronically Signed   By: Rolm Baptise M.D.   On: 11/21/2020 18:05   DG Abd Portable 1V-Small Bowel Obstruction Protocol-initial, 8 hr delay  Result Date: 11/22/2020 CLINICAL DATA:  Small bowel obstruction EXAM: PORTABLE ABDOMEN - 1 VIEW COMPARISON:  11/22/2020 FINDINGS: Continued gaseous distention of bowel. This appears to involve both large and small bowel raising the possibility of ileus. No free air organomegaly. IMPRESSION: Continued gaseous distention of bowel, both large and small bowel currently noted. Question ileus versus bowel obstruction. Electronically Signed   By: Rolm Baptise M.D.   On: 11/22/2020 18:25      Scheduled Meds:  fluticasone furoate-vilanterol  1 puff Inhalation Daily   And   umeclidinium bromide  1 puff Inhalation Daily   heparin  5,000 Units  Subcutaneous Q8H   lidocaine  1 application Other Once   nicotine  21 mg Transdermal Daily   triamcinolone cream  1 application Topical TID   Continuous Infusions:  lactated ringers 100 mL/hr at 11/23/20 0843     LOS: 2 days      Time spent: 30 minutes   Deatra James, MD [facility] 11/23/2020, 3:18 PM   Available via Epic secure chat 7am-7pm After these hours, please refer to coverage provider listed on [email or web address]

## 2020-11-23 NOTE — Progress Notes (Signed)
Pt has been talked to by myself and his day shift RN, Darden Dates, about the benefits of replacing his NGT. Pt is upset that he can't have ice chips at the moment.  Pt then requested to get on [facility] for a BM. Pt was passing gas while on BSC. When pt was done, pt stated that his stomach was no longer hurting. Pt's BM was large, dark brown, and of mush consistency.  XRAY came and did a KUB.  Pt still refusing NGT to be replaced when night medications were given. Pt stated that he will wait until he can talk with MD in the morning.

## 2020-11-23 NOTE — Progress Notes (Signed)
ANTICOAGULATION CONSULT NOTE - Initial Consult  Pharmacy Consult for warfarin Indication: atrial fibrillation  Allergies  Allergen Reactions   Celebrex [Celecoxib]    Methotrexate Derivatives Other (See Comments)    Arrhythmia    Sulfa Antibiotics     Patient Measurements: Height: '6\' 1"'$  (185.4 cm) Weight: (!) 168 kg (370 lb 6 oz) IBW/kg (Calculated) : 79.9 Heparin Dosing Weight:   Vital Signs: Temp: 98.1 F (36.7 C) (08/12 1300) Temp Source: Oral (08/12 1300) BP: 141/70 (08/12 1300) Pulse Rate: 88 (08/12 1300)  Labs: Recent Labs    11/20/20 1550 11/21/20 0431 11/22/20 0500 11/23/20 0459  HGB 13.5 12.3* 12.6*  --   HCT 41.6 38.4* 39.7  --   PLT 192 176 158  --   LABPROT  --  21.9* 18.2*  --   INR  --  1.9* 1.5*  --   CREATININE 0.98 0.87 0.84 0.81    Estimated Creatinine Clearance: 138.2 mL/min (by C-G formula based on SCr of 0.81 mg/dL).   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      albuterol (VENTOLIN HFA) 108 (90 Base) MCG/ACT inhaler Inhale 1 puff into the lungs every 6 (six) hours as needed.      benazepril (LOTENSIN) 10 MG tablet Take 1 tablet (10 mg total) by mouth daily. 30 tablet 6 Past Week   doxycycline (VIBRAMYCIN) 100 MG capsule Take 100 mg by mouth daily.   Past Week   ketoconazole (NIZORAL) 2 % cream Apply 1 application topically 2  (two) times daily as needed for irritation.   Past Week   Metoprolol Tartrate 75 MG TABS TAKE 1 TABLET TWICE A DAY 180 tablet 2 Past Week   simvastatin (ZOCOR) 20 MG tablet TAKE 1 TABLET BY MOUTH DAILY AT 6 PM. 90 tablet 1 Past Week   tamsulosin (FLOMAX) 0.4 MG CAPS capsule Take by mouth daily.   Past Month   torsemide (DEMADEX) 20 MG tablet Take 3 tablets (60 mg total) by mouth 2 (two) times daily. 180 tablet 6 Past Week   TRELEGY ELLIPTA 100-62.5-25 MCG/INH AEPB Inhale 1 puff into the lungs daily.   Past Week   triamcinolone (KENALOG) 0.1 % Apply 1 application topically 3 (three) times daily.   Past Week   warfarin (COUMADIN) 10 MG tablet TAKE 1/2 TO 1 TABLET DAILY AS DIRECTED BY COUMADIN CLINIC. (Patient taking differently: Take 5-10 mg by mouth as directed. Take 1/2 to 1 tablet daily as directed by coumadin clinic.) 90 tablet 1 Past Week at unknown    Assessment: Pharmacy consulted to dose warfarin in patient with atrial fibrillation.  INR currently 1.5.  Home dose listed as 5 mg every Sun/Thurs and 10 mg ROW per anticoag clinic.  Goal of Therapy:  INR 2-3 Monitor platelets by anticoagulation protocol: Yes   Plan:  Warfarin 10 mg x 1 dose.  Monitor daily INR and s/s of bleeding.  Ramond Craver 11/23/2020,3:39 PM

## 2020-11-23 NOTE — Care Management Important Message (Signed)
Important Message  Patient Details  Name: Jesus Rodriguez MRN: [id number] Date of Birth: [birth year]   Medicare Important Message Given:  Yes     Tommy Medal 11/23/2020, 11:30 AM

## 2020-11-23 NOTE — Progress Notes (Signed)
Subjective: Patient has had multiple large bowel movements.  His NG tube came out.  He is hungry.  He denies any abdominal pain.  He is still passing flatus.  Objective: Vital signs in last 24 hours: Temp:  [97.4 F (36.3 C)-98.5 F (36.9 C)] 98.5 F (36.9 C) (08/12 0537) Pulse Rate:  [98-105] 98 (08/12 0537) Resp:  [18-20] 18 (08/12 0537) BP: (108-111)/(55-72) 111/60 (08/12 0537) SpO2:  [4 %-99 %] 96 % (08/12 0808) Last BM Date: 11/22/20  Intake/Output from previous day: 08/11 0701 - 08/12 0700 In: 2198.9 [I.V.:1815.6; IV Piggyback:383.2] Out: -  Intake/Output this shift: No intake/output data recorded.  General appearance: alert, cooperative, and no distress GI: Soft, nontender.  Bowel sounds present.  Difficult to assess distention due to body habitus.  Lab Results:  Recent Labs    11/21/20 0431 11/22/20 0500  WBC 7.6 8.6  HGB 12.3* 12.6*  HCT 38.4* 39.7  PLT 176 158   BMET Recent Labs    11/22/20 0500 11/23/20 0459  NA 137 140  K 3.4* 3.8  CL 97* 101  CO2 32 29  GLUCOSE 91 94  BUN 20 12  CREATININE 0.84 0.81  CALCIUM 8.4* 8.4*   PT/INR Recent Labs    11/21/20 0431 11/22/20 0500  LABPROT 21.9* 18.2*  INR 1.9* 1.5*    Studies/Results: DG Abd 1 View  Result Date: 11/22/2020 CLINICAL DATA:  Small-bowel obstruction. EXAM: ABDOMEN - 1 VIEW COMPARISON:  11/21/2020 and CT on 11/20/2020 FINDINGS: Nasogastric tube extends into the proximal stomach and terminates in the region of the fundus. Multiple dilated loops of small bowel again noted with similar caliber compared to the prior CT. Maximal small bowel caliber is approximately 5 cm. No evidence of free intraperitoneal air or pneumatosis. There is some sparse air scattered in the colon and findings are consistent with partial small bowel obstruction. IMPRESSION: 1. Stable nasogastric tube positioning with the tip terminating in the region of the fundus. 2. Similar appearance dilated small bowel and sparse  air in the colon consistent with partial small bowel obstruction. No evidence of free air. Electronically Signed   By: Aletta Edouard M.D.   On: 11/22/2020 08:56   DG Abd 1 View  Result Date: 11/21/2020 CLINICAL DATA:  NG tube placement EXAM: ABDOMEN - 1 VIEW COMPARISON:  11/21/2020 FINDINGS: NG tube in the upper abdomen, NG tube tip in the fundus of the stomach. Dilated bowel loops again noted as seen on CT compatible with small bowel obstruction. IMPRESSION: NG tube tip in the fundus of the stomach. Electronically Signed   By: Rolm Baptise M.D.   On: 11/21/2020 19:09   DG Abd 1 View  Result Date: 11/21/2020 CLINICAL DATA:  NG tube placement EXAM: ABDOMEN - 1 VIEW COMPARISON:  11/21/2020 FINDINGS: NG tube tip is in the upper thoracic esophagus near the thoracic inlet. Cardiomegaly. Bibasilar atelectasis. Small left effusion. IMPRESSION: NG tube tip in the upper thoracic esophagus near the thoracic inlet. Electronically Signed   By: Rolm Baptise M.D.   On: 11/21/2020 18:05   DG Abd Portable 1V-Small Bowel Obstruction Protocol-initial, 8 hr delay  Result Date: 11/22/2020 CLINICAL DATA:  Small bowel obstruction EXAM: PORTABLE ABDOMEN - 1 VIEW COMPARISON:  11/22/2020 FINDINGS: Continued gaseous distention of bowel. This appears to involve both large and small bowel raising the possibility of ileus. No free air organomegaly. IMPRESSION: Continued gaseous distention of bowel, both large and small bowel currently noted. Question ileus versus bowel obstruction. Electronically Signed  By: Rolm Baptise M.D.   On: 11/22/2020 18:25    Anti-infectives: Anti-infectives (From admission, onward)    None       Assessment/Plan: Impression: Probable ileus or resolved partial small bowel obstruction as small bowel follow-through did not show any obstruction.  Bowel function has returned.  No need for surgical intervention. Plan: Will advance diet as tolerated.   LOS: 2 days    Aviva Signs 11/23/2020

## 2020-11-24 DIAGNOSIS — G4733 Obstructive sleep apnea (adult) (pediatric): Secondary | ICD-10-CM

## 2020-11-24 DIAGNOSIS — N179 Acute kidney failure, unspecified: Secondary | ICD-10-CM

## 2020-11-24 DIAGNOSIS — F17200 Nicotine dependence, unspecified, uncomplicated: Secondary | ICD-10-CM

## 2020-11-24 DIAGNOSIS — I5032 Chronic diastolic (congestive) heart failure: Secondary | ICD-10-CM

## 2020-11-24 DIAGNOSIS — J9611 Chronic respiratory failure with hypoxia: Secondary | ICD-10-CM

## 2020-11-24 DIAGNOSIS — K56609 Unspecified intestinal obstruction, unspecified as to partial versus complete obstruction: Secondary | ICD-10-CM | POA: Diagnosis not present

## 2020-11-24 DIAGNOSIS — I4819 Other persistent atrial fibrillation: Secondary | ICD-10-CM

## 2020-11-24 LAB — MAGNESIUM: Magnesium: 1.7 mg/dL (ref 1.7–2.4)

## 2020-11-24 LAB — CBC
HCT: 41.7 % (ref 39.0–52.0)
Hemoglobin: 13 g/dL (ref 13.0–17.0)
MCH: 30.6 pg (ref 26.0–34.0)
MCHC: 31.2 g/dL (ref 30.0–36.0)
MCV: 98.1 fL (ref 80.0–100.0)
Platelets: 181 10*3/uL (ref 150–400)
RBC: 4.25 MIL/uL (ref 4.22–5.81)
RDW: 15.4 % (ref 11.5–15.5)
WBC: 8.7 10*3/uL (ref 4.0–10.5)
nRBC: 0 % (ref 0.0–0.2)

## 2020-11-24 LAB — BASIC METABOLIC PANEL
Anion gap: 9 (ref 5–15)
BUN: 13 mg/dL (ref 8–23)
CO2: 30 mmol/L (ref 22–32)
Calcium: 8.4 mg/dL — ABNORMAL LOW (ref 8.9–10.3)
Chloride: 95 mmol/L — ABNORMAL LOW (ref 98–111)
Creatinine, Ser: 1.32 mg/dL — ABNORMAL HIGH (ref 0.61–1.24)
GFR, Estimated: 58 mL/min — ABNORMAL LOW (ref >60)
Glucose, Bld: 124 mg/dL — ABNORMAL HIGH (ref 70–99)
Potassium: 4.2 mmol/L (ref 3.5–5.1)
Sodium: 134 mmol/L — ABNORMAL LOW (ref 135–145)

## 2020-11-24 LAB — PHOSPHORUS: Phosphorus: 2.4 mg/dL — ABNORMAL LOW (ref 2.5–4.6)

## 2020-11-24 LAB — BRAIN NATRIURETIC PEPTIDE: B Natriuretic Peptide: 164 pg/mL — ABNORMAL HIGH (ref 0.0–100.0)

## 2020-11-24 LAB — PROTIME-INR
INR: 1.4 — ABNORMAL HIGH (ref 0.8–1.2)
Prothrombin Time: 17.4 seconds — ABNORMAL HIGH (ref 11.4–15.2)

## 2020-11-24 MED ORDER — WARFARIN SODIUM 5 MG PO TABS
10.0000 mg | ORAL_TABLET | Freq: Once | ORAL | Status: AC
  Administered 2020-11-24: 10 mg via ORAL
  Filled 2020-11-24: qty 2

## 2020-11-24 MED ORDER — MAGNESIUM SULFATE 2 GM/50ML IV SOLN
2.0000 g | Freq: Once | INTRAVENOUS | Status: AC
  Administered 2020-11-24: 2 g via INTRAVENOUS
  Filled 2020-11-24: qty 50

## 2020-11-24 MED ORDER — SODIUM CHLORIDE 0.9 % IV BOLUS
500.0000 mL | Freq: Once | INTRAVENOUS | Status: AC
  Administered 2020-11-24: 500 mL via INTRAVENOUS

## 2020-11-24 MED ORDER — K PHOS MONO-SOD PHOS DI & MONO 155-852-130 MG PO TABS
500.0000 mg | ORAL_TABLET | Freq: Two times a day (BID) | ORAL | Status: DC
  Administered 2020-11-24 – 2020-11-27 (×7): 500 mg via ORAL
  Filled 2020-11-24 (×7): qty 2

## 2020-11-24 MED ORDER — METOPROLOL TARTRATE 25 MG PO TABS
25.0000 mg | ORAL_TABLET | Freq: Two times a day (BID) | ORAL | Status: DC
  Administered 2020-11-24: 25 mg via ORAL
  Filled 2020-11-24: qty 1

## 2020-11-24 NOTE — Progress Notes (Signed)
  Subjective: Patient tolerating full liquid diet well.  Has had multiple bowel movements.  He did have an episode of an upset stomach after he started using his CPAP machine yesterday evening, but he had no emesis.  He continues to have flatus.  Objective: Vital signs in last 24 hours: Temp:  [97.5 F (36.4 C)-98.4 F (36.9 C)] 98.4 F (36.9 C) (08/13 0930) Pulse Rate:  [87-134] 118 (08/13 0930) Resp:  [18-20] 20 (08/13 0930) BP: (86-141)/(42-85) 93/58 (08/13 0930) SpO2:  [86 %-98 %] 97 % (08/13 0930) Last BM Date: 11/23/20  Intake/Output from previous day: 08/12 0701 - 08/13 0700 In: 2626.1 [I.V.:2125.8; IV Piggyback:500.3] Out: -  Intake/Output this shift: No intake/output data recorded.  General appearance: alert, cooperative, and no distress GI: soft, non-tender; bowel sounds normal; no masses,  no organomegaly  Lab Results:  Recent Labs    11/22/20 0500 11/24/20 0610  WBC 8.6 8.7  HGB 12.6* 13.0  HCT 39.7 41.7  PLT 158 181   BMET Recent Labs    11/23/20 0459 11/24/20 0610  NA 140 134*  K 3.8 4.2  CL 101 95*  CO2 29 30  GLUCOSE 94 124*  BUN 12 13  CREATININE 0.81 1.32*  CALCIUM 8.4* 8.4*   PT/INR Recent Labs    11/22/20 0500 11/24/20 0610  LABPROT 18.2* 17.4*  INR 1.5* 1.4*    Studies/Results: DG Abd Portable 1V-Small Bowel Obstruction Protocol-initial, 8 hr delay  Result Date: 11/22/2020 CLINICAL DATA:  Small bowel obstruction EXAM: PORTABLE ABDOMEN - 1 VIEW COMPARISON:  11/22/2020 FINDINGS: Continued gaseous distention of bowel. This appears to involve both large and small bowel raising the possibility of ileus. No free air organomegaly. IMPRESSION: Continued gaseous distention of bowel, both large and small bowel currently noted. Question ileus versus bowel obstruction. Electronically Signed   By: Rolm Baptise M.D.   On: 11/22/2020 18:25    Anti-infectives: Anti-infectives (From admission, onward)    None        Assessment/Plan: Impression: Ileus, resolving.  No need for acute surgical intervention.  Will advance to heart healthy diet.  LOS: 3 days    Aviva Signs 11/24/2020

## 2020-11-24 NOTE — Progress Notes (Signed)
PROGRESS NOTE  Jesus Rodriguez [id number] DOB: 17-Feb-1951 DOA: 11/20/2020 PCP: Patient, [name] (Inactive)  Brief History:  70 year old male with a history of hypertension, hyperlipidemia, COPD, diastolic CHF, atrial fibrillation, rheumatoid arthritis, depression, scrotal carcinoma presenting with 4-day history of abdominal pain with nausea and vomiting.  CT in the ED showed small bowel obstruction pattern with multiple dilated loops of small bowel and collapsed distal ileum.  The patient was placed on IV fluids and bowel rest.  General surgery was consulted to assist with management.  Since his hospitalization, the patient has developed some atrial fibrillation with RVR.  His bowel function gradually improved.  He began having bowel movements.  His diet was advanced which she has been tolerating.  Assessment/Plan:  Small bowel obstruction -Appreciate general surgery follow-up -Patient initially required NG decompression -Patient had 3 bowel movements in last 24 hours -Diet advancement per general surgery -Currently on full liquids -Patient was appropriately fluid resuscitated  Atrial fibrillation with RVR, persistent -Patient was n.p.o. limiting intake of his p.o. medications -Restart metoprolol tartrate at lower dose -Restart warfarin - Previously followed by [facility].  Failed sotalol, started on dofetilide, followed by Dr. Rayann Heman.  Previous ablation 2017 -No longer on dofetilide  COPD/chronic respiratory failure with hypoxia -Patient is on 4 L nasal cannula at home -Currently with oxygen saturation 92% on 3.5 L -Continue bronchodilators  AKI -due to volume depletion, possible component of ATN from soft BPs -baseline creatinine 0.8-0.9 -serum creatinine peaking 1.32 -hold torsemide temporatrily  Essential hypertension -Holding benazepril secondary to soft blood pressure -Decrease dose of metoprolol titrate secondary to soft blood  pressure  Hyperlipidemia -Continue Zocor  Chronic diastolic CHF -123456 echo EF 55 to 60%, indeterminate diastolic function, normal RV -Normally on torsemide 60 mg twice daily which will be restarted  OSA -Patient is normally on CPAP at home with which she is compliant  Tobacco abuse -Tobacco cessation discussed  Hypophosphatemia -replete  Morbid obesity -BMI 48.86 -Lifestyle modification       Status is: Inpatient  Remains inpatient appropriate because:Inpatient level of care appropriate due to severity of illness  Dispo: The patient is from: Home              Anticipated d/c is to: Home              Patient currently is not medically stable to d/c.   Difficult to place patient No        Family Communication:   no Family at bedside  Consultants:  general surgery  Code Status:  FULL   DVT Prophylaxis:  coumadin   Procedures: As Listed in Progress Note Above  Antibiotics: None     Subjective: Patient denies fevers, chills, headache, chest pain, dyspnea, nausea, vomiting, diarrhea, abdominal pain, dysuria, hematuria, hematochezia, and melena. Had 3 BM yesterday.  Intermittent abd pain, but overall improving  Objective: Vitals:   11/24/20 0400 11/24/20 0407 11/24/20 0559 11/24/20 0757  BP:  (!) 86/60 (!) 89/57   Pulse: (!) 104 (!) 109 (!) 104   Resp: '18 18 18   '$ Temp: 98.4 F (36.9 C) 98.4 F (36.9 C) 98.1 F (36.7 C)   TempSrc: Oral Oral Oral   SpO2: (!) 87% (!) 86% 91% 92%  Weight:      Height:        Intake/Output Summary (Last 24 hours) at 11/24/2020 0857 Last data filed at 11/24/2020 0609 Gross per 24 hour  Intake 2626.09 ml  Output --  Net 2626.09 ml   Weight change:  Exam:  General:  Pt is alert, follows commands appropriately, not in acute distress HEENT: No icterus, No thrush, No neck mass, [place]/AT Cardiovascular: RRR, S1/S2, no rubs, no gallops Respiratory: bibasilar crackles. No wheeze Abdomen: Soft/+BS, non tender,  non distended, no guarding Extremities: trace LE edema, No lymphangitis, No petechiae, No rashes, no synovitis   Data Reviewed: I have personally reviewed following labs and imaging studies Basic Metabolic Panel: Recent Labs  Lab 11/20/20 1550 11/21/20 0431 11/22/20 0500 11/23/20 0459 11/24/20 0610  NA 132* 136 137 140 134*  K 3.5 3.1* 3.4* 3.8 4.2  CL 93* 95* 97* 101 95*  CO2 28 32 32 29 30  GLUCOSE 115* 92 91 94 124*  BUN 29* 24* '20 12 13  '$ CREATININE 0.98 0.87 0.84 0.81 1.32*  CALCIUM 8.4* 8.3* 8.4* 8.4* 8.4*  MG  --  2.0 2.0 2.0 1.7  PHOS  --   --   --   --  2.4*   Liver Function Tests: Recent Labs  Lab 11/20/20 1550 11/21/20 0431 11/22/20 0500  AST 32 23 20  ALT '18 17 18  '$ ALKPHOS 78 70 73  BILITOT 1.5* 1.6* 1.6*  PROT 6.8 5.9* 6.2*  ALBUMIN 3.2* 2.8* 2.9*   Recent Labs  Lab 11/20/20 1550  LIPASE 21   No results for input(s): AMMONIA in the last 168 hours. Coagulation Profile: Recent Labs  Lab 11/21/20 0431 11/22/20 0500 11/24/20 0610  INR 1.9* 1.5* 1.4*   CBC: Recent Labs  Lab 11/20/20 1550 11/21/20 0431 11/22/20 0500 11/24/20 0610  WBC 9.0 7.6 8.6 8.7  NEUTROABS 7.1 5.8  --   --   HGB 13.5 12.3* 12.6* 13.0  HCT 41.6 38.4* 39.7 41.7  MCV 93.9 94.1 95.4 98.1  PLT 192 176 158 181   Cardiac Enzymes: No results for input(s): CKTOTAL, CKMB, CKMBINDEX, TROPONINI in the last 168 hours. BNP: Invalid input(s): POCBNP CBG: No results for input(s): GLUCAP in the last 168 hours. HbA1C: No results for input(s): HGBA1C in the last 72 hours. Urine analysis: No results found for: COLORURINE, APPEARANCEUR, LABSPEC, PHURINE, GLUCOSEU, HGBUR, BILIRUBINUR, KETONESUR, PROTEINUR, UROBILINOGEN, NITRITE, LEUKOCYTESUR Sepsis Labs: '@LABRCNTIP'$ (procalcitonin:4,lacticidven:4) ) Recent Results (from the past 240 hour(s))  Resp Panel by RT-PCR (Flu A&B, Covid) Nasopharyngeal Swab     Status: None   Collection Time: 11/20/20  3:31 PM   Specimen: Nasopharyngeal  Swab; Nasopharyngeal(NP) swabs in vial transport medium  Result Value Ref Range Status   SARS Coronavirus 2 by RT PCR NEGATIVE NEGATIVE Final    Comment: (NOTE) SARS-CoV-2 target nucleic acids are NOT DETECTED.  The SARS-CoV-2 RNA is generally detectable in upper respiratory specimens during the acute phase of infection. The lowest concentration of SARS-CoV-2 viral copies this assay can detect is 138 copies/mL. A negative result does not preclude SARS-Cov-2 infection and should not be used as the sole basis for treatment or other patient management decisions. A negative result may occur with  improper specimen collection/handling, submission of specimen other than nasopharyngeal swab, presence of viral mutation(s) within the areas targeted by this assay, and inadequate number of viral copies(<138 copies/mL). A negative result must be combined with clinical observations, patient history, and epidemiological information. The expected result is Negative.  Fact Sheet for Patients:  EntrepreneurPulse.com.au  Fact Sheet for Healthcare Providers:  IncredibleEmployment.be  This test is no t yet approved or cleared by the [place] FDA and  has  been authorized for detection and/or diagnosis of SARS-CoV-2 by FDA under an Emergency Use Authorization (EUA). This EUA will remain  in effect (meaning this test can be used) for the duration of the COVID-19 declaration under Section 564(b)(1) of the Act, 21 U.S.C.section 360bbb-3(b)(1), unless the authorization is terminated  or revoked sooner.       Influenza A by PCR NEGATIVE NEGATIVE Final   Influenza B by PCR NEGATIVE NEGATIVE Final    Comment: (NOTE) The Xpert Xpress SARS-CoV-2/FLU/RSV plus assay is intended as an aid in the diagnosis of influenza from Nasopharyngeal swab specimens and should not be used as a sole basis for treatment. Nasal washings and aspirates are unacceptable for Xpert Xpress  SARS-CoV-2/FLU/RSV testing.  Fact Sheet for Patients: EntrepreneurPulse.com.au  Fact Sheet for Healthcare Providers: IncredibleEmployment.be  This test is not yet approved or cleared by the [place] FDA and has been authorized for detection and/or diagnosis of SARS-CoV-2 by FDA under an Emergency Use Authorization (EUA). This EUA will remain in effect (meaning this test can be used) for the duration of the COVID-19 declaration under Section 564(b)(1) of the Act, 21 U.S.C. section 360bbb-3(b)(1), unless the authorization is terminated or revoked.  Performed at [facility], [address], [place], [place] [address]      Scheduled Meds:  fluticasone furoate-vilanterol  1 puff Inhalation Daily   And   umeclidinium bromide  1 puff Inhalation Daily   heparin  5,000 Units Subcutaneous Q8H   lidocaine  1 application Other Once   metoprolol tartrate  25 mg Oral BID   nicotine  21 mg Transdermal Daily   simvastatin  20 mg Oral q1800   triamcinolone cream  1 application Topical TID   Warfarin - Pharmacist Dosing Inpatient   Does not apply q1600   Continuous Infusions:  Procedures/Studies: DG Abd 1 View  Result Date: 11/22/2020 CLINICAL DATA:  Small-bowel obstruction. EXAM: ABDOMEN - 1 VIEW COMPARISON:  11/21/2020 and CT on 11/20/2020 FINDINGS: Nasogastric tube extends into the proximal stomach and terminates in the region of the fundus. Multiple dilated loops of small bowel again noted with similar caliber compared to the prior CT. Maximal small bowel caliber is approximately 5 cm. No evidence of free intraperitoneal air or pneumatosis. There is some sparse air scattered in the colon and findings are consistent with partial small bowel obstruction. IMPRESSION: 1. Stable nasogastric tube positioning with the tip terminating in the region of the fundus. 2. Similar appearance dilated small bowel and sparse air in the colon consistent with partial small  bowel obstruction. No evidence of free air. Electronically Signed   By: Aletta Edouard M.D.   On: 11/22/2020 08:56   DG Abd 1 View  Result Date: 11/21/2020 CLINICAL DATA:  NG tube placement EXAM: ABDOMEN - 1 VIEW COMPARISON:  11/21/2020 FINDINGS: NG tube in the upper abdomen, NG tube tip in the fundus of the stomach. Dilated bowel loops again noted as seen on CT compatible with small bowel obstruction. IMPRESSION: NG tube tip in the fundus of the stomach. Electronically Signed   By: Rolm Baptise M.D.   On: 11/21/2020 19:09   DG Abd 1 View  Result Date: 11/21/2020 CLINICAL DATA:  NG tube placement EXAM: ABDOMEN - 1 VIEW COMPARISON:  11/21/2020 FINDINGS: NG tube tip is in the upper thoracic esophagus near the thoracic inlet. Cardiomegaly. Bibasilar atelectasis. Small left effusion. IMPRESSION: NG tube tip in the upper thoracic esophagus near the thoracic inlet. Electronically Signed   By: Lennette Bihari  [place] M.D.   On: 11/21/2020 18:05   DG Abd 1 View  Result Date: 11/21/2020 CLINICAL DATA:  71 year old male with small bowel obstruction. EXAM: ABDOMEN - 1 VIEW COMPARISON:  CT Abdomen and Pelvis 11/20/2020. FINDINGS: Portable AP supine view at 0443 hours. Small-bowel obstruction pattern persists, and has not significantly changed. Some of the gas-filled small bowel loops may be slightly less dilated since yesterday. Excreted IV contrast in the urinary bladder. No enteric tube identified. Stable elevated left hemidiaphragm. No acute osseous abnormality identified. IMPRESSION: Small-bowel obstruction gas pattern not significantly changed since yesterday. Electronically Signed   By: Genevie Ann M.D.   On: 11/21/2020 06:09   CT ABDOMEN PELVIS W CONTRAST  Result Date: 11/20/2020 CLINICAL DATA:  Acute abdominal pain.  Vomiting. EXAM: CT ABDOMEN AND PELVIS WITH CONTRAST TECHNIQUE: Multidetector CT imaging of the abdomen and pelvis was performed using the standard protocol following bolus administration of intravenous  contrast. CONTRAST:  140m OMNIPAQUE IOHEXOL 350 MG/ML SOLN COMPARISON:  CT abdomen 04/22/2018 FINDINGS: Lower chest: Atelectasis LEFT lung base. Elevated LEFT hemidiaphragm diaphragm Hepatobiliary: Simple fluid attenuation lesion in the LEFT hepatic lobe not changed from prior. Probable collection of small benign cysts. No biliary duct dilatation. Gallbladder normal. Suggestion of sludge within the gallbladder. Pancreas: Pancreas is normal. No ductal dilatation. No pancreatic inflammation. Spleen: Normal spleen Adrenals/urinary tract: Adrenal glands and kidneys are normal. The ureters and bladder normal. Stomach/Bowel: Stomach is normal.  Duodenum is normal. There is laxity of the abdominal wall such that the entirety of the abdomen is not imaged. There are dilated loops of small bowel within the mid jejunum and ileum measuring up to 5.4 cm (coronal image 23/series 5) the small bowel is collapsed leading up to the terminal ileum. Appendix normal. No transitional point is identified No pneumatosis or portal venous gas. Small volume stool in the colon. Gas throughout colon. Gas in the rectum. Vascular/Lymphatic: Abdominal aorta is normal caliber with atherosclerotic calcification. There is no retroperitoneal or periportal lymphadenopathy. No pelvic lymphadenopathy. Reproductive: Prostate un remark Other: No intraperitoneal free air.  No intraperitoneal free fluid Musculoskeletal: No aggressive osseous lesion. IMPRESSION: 1. Small bowel obstruction pattern with multiple dilated loops of mid small bowel and collapsed distal ileum. No discrete transition point identified. 2. No pneumatosis or portal venous gas identified.  No perforation 3. Stool and gas in the colon suggests early obstruction. Electronically Signed   By: SSuzy BouchardM.D.   On: 11/20/2020 17:32   DG Abd Portable 1V-Small Bowel Obstruction Protocol-initial, 8 hr delay  Result Date: 11/22/2020 CLINICAL DATA:  Small bowel obstruction EXAM:  PORTABLE ABDOMEN - 1 VIEW COMPARISON:  11/22/2020 FINDINGS: Continued gaseous distention of bowel. This appears to involve both large and small bowel raising the possibility of ileus. No free air organomegaly. IMPRESSION: Continued gaseous distention of bowel, both large and small bowel currently noted. Question ileus versus bowel obstruction. Electronically Signed   By: KRolm BaptiseM.D.   On: 11/22/2020 18:25    DOrson Eva DO  [facility]  If 7PM-7AM, please contact night-coverage [email or web address] Password [email or web address]8/13/2022, 8:57 AM   LOS: 3 days

## 2020-11-24 NOTE — Progress Notes (Signed)
ANTICOAGULATION CONSULT NOTE -  Pharmacy Consult for warfarin Indication: atrial fibrillation  Allergies  Allergen Reactions   Celebrex [Celecoxib]    Methotrexate Derivatives Other (See Comments)    Arrhythmia    Sulfa Antibiotics     Patient Measurements: Height: '6\' 1"'$  (185.4 cm) Weight: (!) 168 kg (370 lb 6 oz) IBW/kg (Calculated) : 79.9 Heparin Dosing Weight:   Vital Signs: Temp: 99 F (37.2 C) (08/13 1152) Temp Source: Oral (08/13 1152) BP: 92/55 (08/13 1152) Pulse Rate: 111 (08/13 1152)  Labs: Recent Labs    11/22/20 0500 11/23/20 0459 11/24/20 0610  HGB 12.6*  --  13.0  HCT 39.7  --  41.7  PLT 158  --  181  LABPROT 18.2*  --  17.4*  INR 1.5*  --  1.4*  CREATININE 0.84 0.81 1.32*     Estimated Creatinine Clearance: 84.8 mL/min (A) (by C-G formula based on SCr of 1.32 mg/dL (H)).   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      albuterol (VENTOLIN HFA) 108 (90 Base) MCG/ACT inhaler Inhale 1 puff into the lungs every 6 (six) hours as needed.      benazepril (LOTENSIN) 10 MG tablet Take 1 tablet (10 mg total) by mouth daily. 30 tablet 6 Past Week   doxycycline (VIBRAMYCIN) 100 MG capsule Take 100 mg by mouth daily.   Past Week   ketoconazole (NIZORAL) 2 % cream Apply 1 application topically 2 (two) times daily as needed for irritation.   Past Week    Metoprolol Tartrate 75 MG TABS TAKE 1 TABLET TWICE A DAY 180 tablet 2 Past Week   simvastatin (ZOCOR) 20 MG tablet TAKE 1 TABLET BY MOUTH DAILY AT 6 PM. 90 tablet 1 Past Week   tamsulosin (FLOMAX) 0.4 MG CAPS capsule Take by mouth daily.   Past Month   torsemide (DEMADEX) 20 MG tablet Take 3 tablets (60 mg total) by mouth 2 (two) times daily. 180 tablet 6 Past Week   TRELEGY ELLIPTA 100-62.5-25 MCG/INH AEPB Inhale 1 puff into the lungs daily.   Past Week   triamcinolone (KENALOG) 0.1 % Apply 1 application topically 3 (three) times daily.   Past Week   warfarin (COUMADIN) 10 MG tablet TAKE 1/2 TO 1 TABLET DAILY AS DIRECTED BY COUMADIN CLINIC. (Patient taking differently: Take 5-10 mg by mouth as directed. Take 1/2 to 1 tablet daily as directed by coumadin clinic.) 90 tablet 1 Past Week at unknown    Assessment: Pharmacy consulted to dose warfarin in patient with atrial fibrillation.  INR currently 1.5.  Home dose listed as 5 mg every Sun/Thurs and 10 mg ROW per anticoag clinic.  Goal of Therapy:  INR 2-3 Monitor platelets by anticoagulation protocol: Yes   Plan:  Warfarin 10 mg x 1 dose today Monitor daily INR and s/s of bleeding.  Hart Robinsons  A 11/24/2020,12:29 PM

## 2020-11-25 ENCOUNTER — Other Ambulatory Visit (HOSPITAL_COMMUNITY): Payer: Medicare Other

## 2020-11-25 DIAGNOSIS — I9589 Other hypotension: Secondary | ICD-10-CM

## 2020-11-25 DIAGNOSIS — I959 Hypotension, unspecified: Secondary | ICD-10-CM

## 2020-11-25 DIAGNOSIS — K56609 Unspecified intestinal obstruction, unspecified as to partial versus complete obstruction: Secondary | ICD-10-CM | POA: Diagnosis not present

## 2020-11-25 LAB — CBC
HCT: 36.6 % — ABNORMAL LOW (ref 39.0–52.0)
HCT: 37.6 % — ABNORMAL LOW (ref 39.0–52.0)
Hemoglobin: 11.8 g/dL — ABNORMAL LOW (ref 13.0–17.0)
Hemoglobin: 12 g/dL — ABNORMAL LOW (ref 13.0–17.0)
MCH: 30.9 pg (ref 26.0–34.0)
MCH: 30.5 pg (ref 26.0–34.0)
MCHC: 32.2 g/dL (ref 30.0–36.0)
MCHC: 31.9 g/dL (ref 30.0–36.0)
MCV: 95.8 fL (ref 80.0–100.0)
MCV: 95.7 fL (ref 80.0–100.0)
Platelets: 180 10*3/uL (ref 150–400)
Platelets: 175 10*3/uL (ref 150–400)
RBC: 3.82 MIL/uL — ABNORMAL LOW (ref 4.22–5.81)
RBC: 3.93 MIL/uL — ABNORMAL LOW (ref 4.22–5.81)
RDW: 15.2 % (ref 11.5–15.5)
RDW: 15 % (ref 11.5–15.5)
WBC: 7.8 10*3/uL (ref 4.0–10.5)
WBC: 7.2 10*3/uL (ref 4.0–10.5)
nRBC: 0 % (ref 0.0–0.2)
nRBC: 0 % (ref 0.0–0.2)

## 2020-11-25 LAB — MRSA NEXT GEN BY PCR, NASAL: MRSA by PCR Next Gen: NOT DETECTED

## 2020-11-25 LAB — BASIC METABOLIC PANEL
Anion gap: 3 — ABNORMAL LOW (ref 5–15)
BUN: 27 mg/dL — ABNORMAL HIGH (ref 8–23)
CO2: 29 mmol/L (ref 22–32)
Calcium: 7.2 mg/dL — ABNORMAL LOW (ref 8.9–10.3)
Chloride: 98 mmol/L (ref 98–111)
Creatinine, Ser: 1.6 mg/dL — ABNORMAL HIGH (ref 0.61–1.24)
GFR, Estimated: 46 mL/min — ABNORMAL LOW (ref >60)
Glucose, Bld: 100 mg/dL — ABNORMAL HIGH (ref 70–99)
Potassium: 3.7 mmol/L (ref 3.5–5.1)
Sodium: 130 mmol/L — ABNORMAL LOW (ref 135–145)

## 2020-11-25 LAB — TSH: TSH: 1.421 u[IU]/mL (ref 0.350–4.500)

## 2020-11-25 LAB — PROCALCITONIN: Procalcitonin: 0.41 ng/mL

## 2020-11-25 LAB — PHOSPHORUS: Phosphorus: 3.8 mg/dL (ref 2.5–4.6)

## 2020-11-25 LAB — MAGNESIUM: Magnesium: 1.8 mg/dL (ref 1.7–2.4)

## 2020-11-25 LAB — LACTIC ACID, PLASMA: Lactic Acid, Venous: 0.8 mmol/L (ref 0.5–1.9)

## 2020-11-25 LAB — PROTIME-INR
INR: 1.7 — ABNORMAL HIGH (ref 0.8–1.2)
Prothrombin Time: 19.7 seconds — ABNORMAL HIGH (ref 11.4–15.2)

## 2020-11-25 MED ORDER — HYDROCORTISONE NA SUCCINATE PF 100 MG IJ SOLR
100.0000 mg | Freq: Three times a day (TID) | INTRAMUSCULAR | Status: DC
  Administered 2020-11-25 – 2020-11-27 (×6): 100 mg via INTRAVENOUS
  Filled 2020-11-25 (×6): qty 2

## 2020-11-25 MED ORDER — SODIUM CHLORIDE 0.9 % IV BOLUS
500.0000 mL | Freq: Once | INTRAVENOUS | Status: AC
  Administered 2020-11-25: 500 mL via INTRAVENOUS

## 2020-11-25 MED ORDER — AMIODARONE HCL IN DEXTROSE 360-4.14 MG/200ML-% IV SOLN
30.0000 mg/h | INTRAVENOUS | Status: DC
  Administered 2020-11-25 (×2): 30 mg/h via INTRAVENOUS
  Filled 2020-11-25 (×2): qty 200

## 2020-11-25 MED ORDER — VANCOMYCIN HCL 2000 MG/400ML IV SOLN
2000.0000 mg | Freq: Once | INTRAVENOUS | Status: AC
  Administered 2020-11-25: 2000 mg via INTRAVENOUS
  Filled 2020-11-25: qty 400

## 2020-11-25 MED ORDER — AMIODARONE LOAD VIA INFUSION
150.0000 mg | Freq: Once | INTRAVENOUS | Status: AC
  Administered 2020-11-25: 150 mg via INTRAVENOUS
  Filled 2020-11-25: qty 83.34

## 2020-11-25 MED ORDER — HYDROMORPHONE HCL 1 MG/ML IJ SOLN: 0.5000 mg | INTRAMUSCULAR | Status: DC | PRN

## 2020-11-25 MED ORDER — CHLORHEXIDINE GLUCONATE CLOTH 2 % EX PADS
6.0000 | MEDICATED_PAD | Freq: Every day | CUTANEOUS | Status: DC
  Administered 2020-11-25 – 2020-11-27 (×4): 6 via TOPICAL

## 2020-11-25 MED ORDER — WARFARIN SODIUM 5 MG PO TABS
10.0000 mg | ORAL_TABLET | Freq: Once | ORAL | Status: AC
  Administered 2020-11-25: 10 mg via ORAL
  Filled 2020-11-25: qty 2

## 2020-11-25 MED ORDER — VANCOMYCIN HCL 1500 MG/300ML IV SOLN
1500.0000 mg | Freq: Two times a day (BID) | INTRAVENOUS | Status: DC
  Administered 2020-11-26 – 2020-11-27 (×3): 1500 mg via INTRAVENOUS
  Filled 2020-11-25 (×3): qty 300

## 2020-11-25 MED ORDER — SODIUM CHLORIDE 0.9 % IV SOLN
2.0000 g | Freq: Two times a day (BID) | INTRAVENOUS | Status: DC
  Administered 2020-11-25: 2 g via INTRAVENOUS
  Filled 2020-11-25 (×2): qty 2

## 2020-11-25 MED ORDER — AMIODARONE HCL IN DEXTROSE 360-4.14 MG/200ML-% IV SOLN
60.0000 mg/h | INTRAVENOUS | Status: DC
  Administered 2020-11-25 (×2): 60 mg/h via INTRAVENOUS
  Filled 2020-11-25: qty 200

## 2020-11-25 MED ORDER — LACTATED RINGERS IV BOLUS
1000.0000 mL | Freq: Once | INTRAVENOUS | Status: AC
  Administered 2020-11-25: 1000 mL via INTRAVENOUS

## 2020-11-25 MED ORDER — SODIUM CHLORIDE 0.9 % IV BOLUS
1000.0000 mL | Freq: Once | INTRAVENOUS | Status: AC
  Administered 2020-11-25: 1000 mL via INTRAVENOUS

## 2020-11-25 MED ORDER — ALBUMIN HUMAN 25 % IV SOLN
25.0000 g | Freq: Once | INTRAVENOUS | Status: AC
  Administered 2020-11-25: 25 g via INTRAVENOUS
  Filled 2020-11-25: qty 100

## 2020-11-25 NOTE — Progress Notes (Signed)
Updated sister at beside at 1400.  DTat

## 2020-11-25 NOTE — Progress Notes (Signed)
Cross-coverage note:   Notified by secure chat of SBP 70 and HR 127. Patient asymptomatic.   Plan to give 1 liter bolus and transfer to stepdown unit.

## 2020-11-25 NOTE — Progress Notes (Signed)
   11/25/20 0520  Assess: MEWS Score  Temp 97.9 F (36.6 C)  BP (!) 70/45  Pulse Rate (!) 127  Resp 18  Level of Consciousness Alert  SpO2 92 %  O2 Device Nasal Cannula  O2 Flow Rate (L/min) 4 L/min  Assess: MEWS Score  MEWS Temp 0  MEWS Systolic 2  MEWS Pulse 2  MEWS RR 0  MEWS LOC 0  MEWS Score 4  MEWS Score Color Red  Notify: Charge Nurse/RN  Name of Charge Nurse/RN Notified Hillary Schwegler  Date Charge Nurse/RN Notified 11/25/20  Time Charge Nurse/RN Notified [phone]  Notify: Provider  Provider Name/Title Mitzi Hansen  Date Provider Notified 11/25/20  Time Provider Notified [phone]  Notification Type Page  Notification Reason Other (Comment) ([place])  Provider response No new orders

## 2020-11-25 NOTE — Progress Notes (Signed)
Called and updated patient's daughter listed on face sheet.  DTat

## 2020-11-25 NOTE — Progress Notes (Signed)
ANTICOAGULATION CONSULT NOTE -  Pharmacy Consult for warfarin Indication: atrial fibrillation  Allergies  Allergen Reactions   Celebrex [Celecoxib]    Methotrexate Derivatives Other (See Comments)    Arrhythmia    Sulfa Antibiotics     Patient Measurements: Height: '6\' 1"'$  (185.4 cm) Weight: (!) 169.4 kg (373 lb 7.4 oz) IBW/kg (Calculated) : 79.9 Heparin Dosing Weight:   Vital Signs: Temp: 97.8 F (36.6 C) (08/14 0736) Temp Source: Oral (08/14 0736) BP: 107/43 (08/14 0641) Pulse Rate: 138 (08/14 0736)  Labs: Recent Labs    11/23/20 0459 11/24/20 0610 11/24/20 0610 11/25/20 0557 11/25/20 0601 11/25/20 0822  HGB  --  13.0   < >  --  11.8* 12.0*  HCT  --  41.7  --   --  36.6* 37.6*  PLT  --  181  --   --  180 175  LABPROT  --  17.4*  --  19.7*  --   --   INR  --  1.4*  --  1.7*  --   --   CREATININE 0.81 1.32*  --  1.60*  --   --    < > = values in this interval not displayed.     Estimated Creatinine Clearance: 70.3 mL/min (A) (by C-G formula based on SCr of 1.6 mg/dL (H)).   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      albuterol (VENTOLIN HFA) 108 (90 Base) MCG/ACT inhaler Inhale 1 puff into the lungs every 6 (six) hours as needed.      benazepril (LOTENSIN) 10 MG tablet Take 1 tablet (10 mg total) by mouth daily. 30 tablet 6 Past Week   doxycycline  (VIBRAMYCIN) 100 MG capsule Take 100 mg by mouth daily.   Past Week   ketoconazole (NIZORAL) 2 % cream Apply 1 application topically 2 (two) times daily as needed for irritation.   Past Week   Metoprolol Tartrate 75 MG TABS TAKE 1 TABLET TWICE A DAY 180 tablet 2 Past Week   simvastatin (ZOCOR) 20 MG tablet TAKE 1 TABLET BY MOUTH DAILY AT 6 PM. 90 tablet 1 Past Week   tamsulosin (FLOMAX) 0.4 MG CAPS capsule Take by mouth daily.   Past Month   torsemide (DEMADEX) 20 MG tablet Take 3 tablets (60 mg total) by mouth 2 (two) times daily. 180 tablet 6 Past Week   TRELEGY ELLIPTA 100-62.5-25 MCG/INH AEPB Inhale 1 puff into the lungs daily.   Past Week   triamcinolone (KENALOG) 0.1 % Apply 1 application topically 3 (three) times daily.   Past Week   warfarin (COUMADIN) 10 MG tablet TAKE 1/2 TO 1 TABLET DAILY AS DIRECTED BY COUMADIN CLINIC. (Patient taking differently: Take 5-10 mg by mouth as directed. Take 1/2 to 1 tablet daily as directed by coumadin clinic.) 90 tablet 1 Past Week at unknown  Assessment: Pharmacy consulted to dose warfarin in patient with atrial fibrillation.  INR currently 1.5.  Home dose listed as 5 mg every Sun/Thurs and 10 mg ROW per anticoag clinic.  Goal of Therapy:  INR 2-3 Monitor platelets by anticoagulation protocol: Yes   Plan:  Warfarin 10 mg x 1 dose today (due to low INR) Monitor daily INR and s/s of bleeding.  Hart Robinsons A 11/25/2020,9:22 AM

## 2020-11-25 NOTE — Progress Notes (Signed)
Pharmacy Antibiotic Note  Jesus Rodriguez is a 70 y.o. male admitted on 11/20/2020 with sepsis.  Pharmacy has been consulted for Vancomycin dosing.  Plan: Vancomycin 2gm x 1 then '1500mg'$  IV q12hrs  Height: '6\' 1"'$  (185.4 cm) Weight: (!) 169.4 kg (373 lb 7.4 oz) IBW/kg (Calculated) : 79.9  Temp (24hrs), Avg:98 F (36.7 C), Min:97.5 F (36.4 C), Max:99.1 F (37.3 C)  Recent Labs  Lab 11/21/20 0431 11/22/20 0500 11/23/20 0459 11/24/20 0610 11/25/20 0557 11/25/20 0601 11/25/20 0822  WBC 7.6 8.6  --  8.7  --  7.8 7.2  CREATININE 0.87 0.84 0.81 1.32* 1.60*  --   --   LATICACIDVEN  --   --   --   --   --   --  0.8    Estimated Creatinine Clearance: 70.3 mL/min (A) (by C-G formula based on SCr of 1.6 mg/dL (H)).    Allergies  Allergen Reactions   Celebrex [Celecoxib]    Methotrexate Derivatives Other (See Comments)    Arrhythmia    Sulfa Antibiotics     Antimicrobials this admission: Vancomycin 8/14 >>    Dose adjustments this admission:   Microbiology results:  BCx: pending  UCx: pending   Sputum: pending   MRSA PCR: pending  Thank you for allowing pharmacy to be a part of this patient's care.  Hart Robinsons A 11/25/2020 5:18 PM

## 2020-11-25 NOTE — Progress Notes (Signed)
Patient was hypotensive and tachycardic throughout entire shift. MD on duty made aware throughout shift. Given x2 575m Bolus and x1 10057mbolus per MD orders in response to multiple pages about different low blood pressure readings. Patient was ttransfered to the ICU.

## 2020-11-25 NOTE — Progress Notes (Addendum)
PROGRESS NOTE  Jesus Rodriguez [id number] DOB: Apr 22, 1950 DOA: 11/20/2020 PCP: Patient, [name] (Inactive)   Brief History:  70 year old male with a history of hypertension, hyperlipidemia, COPD, diastolic CHF, atrial fibrillation, rheumatoid arthritis, depression, scrotal carcinoma presenting with 4-day history of abdominal pain with nausea and vomiting.  CT in the ED showed small bowel obstruction pattern with multiple dilated loops of small bowel and collapsed distal ileum.  The patient was placed on IV fluids and bowel rest.  General surgery was consulted to assist with management.  Since his hospitalization, the patient has developed some atrial fibrillation with RVR.  His bowel function gradually improved.  He began having bowel movements.  His diet was advanced which she has been tolerating. On 11/24/20 evening, patient developed Afib with RVR with hypotension refractory to IVF bolus.  He was transferred to SDU.   Assessment/Plan:   Small bowel obstruction -Appreciate general surgery follow-up -Patient initially required NG decompression -Patient had 3 bowel movements in last 24 hours -Diet advancement per general surgery -Currently on full liquids>>heart healthy diet -Patient was appropriately fluid resuscitated -overall improved   Atrial fibrillation with RVR, persistent -Patient was n.p.o. limiting intake of his p.o. medications -Restart metoprolol tartrate at lower dose -Restart warfarin - Previously followed by [facility].  Failed sotalol, started on dofetilide, followed by Dr. Rayann Heman.  Previous ablation 2017 -No longer on dofetilide -11/24/20 evening--developed RVR with hypotension refractory to IVF boluses -start amiodarone -may need cardioversion if no improvement   COPD/chronic respiratory failure with hypoxia -Patient is on 4 L nasal cannula at home -Currently with oxygen saturation 92% on 3.5 L -Continue bronchodilators   AKI -due to volume depletion,  possible component of ATN from soft BPs -baseline creatinine 0.8-0.9 -serum creatinine peaking 1.32 -hold torsemide temporatrily   Essential hypertension -Holding benazepril secondary to soft blood pressure -holding metoprolol tartrate due to hypotension   Hyperlipidemia -Continue Zocor   Chronic diastolic CHF -123456 echo EF 55 to 60%, indeterminate diastolic function, normal RV -Normally on torsemide 60 mg twice daily which will be restarted   OSA -Patient is normally on CPAP at home with which she is compliant   Tobacco abuse -Tobacco cessation discussed   Hypophosphatemia -replete   Morbid obesity -BMI 48.86 -Lifestyle modification             Status is: Inpatient   Remains inpatient appropriate because:Inpatient level of care appropriate due to severity of illness   Dispo: The patient is from: Home              Anticipated d/c is to: Home              Patient currently is not medically stable to d/c.              Difficult to place patient No               Family Communication:   no Family at bedside   Consultants:  general surgery   Code Status:  FULL    DVT Prophylaxis:  coumadin     Procedures: As Listed in Progress Note Above   Antibiotics: None    The patient is critically ill with multiple organ systems failure and requires high complexity decision making for assessment and support, frequent evaluation and titration of therapies, application of advanced monitoring technologies and extensive interpretation of multiple databases.  Critical care time - 35 mins.  Subjective: Patient denies fevers, chills, headache, chest pain, dyspnea, nausea, vomiting, diarrhea, abdominal pain, dysuria, hematuria, hematochezia, and melena. Had 2 BMs in last 24 hours.  Objective: Vitals:   11/25/20 0640 11/25/20 0641 11/25/20 0736 11/25/20 0742  BP:  (!) 107/43    Pulse:  (!) 124 (!) 138   Resp:  (!) 29 (!) 27   Temp: 98.1 F (36.7 C)  98.1 F (36.7 C) 97.8 F (36.6 C)   TempSrc: Oral Oral Oral   SpO2:  98% 94% 96%  Weight:  (!) 169.4 kg    Height:  '6\' 1"'$  (1.854 m)      Intake/Output Summary (Last 24 hours) at 11/25/2020 0801 Last data filed at 11/25/2020 0646 Gross per 24 hour  Intake 2177.34 ml  Output 250 ml  Net 1927.34 ml   Weight change:  Exam:  General:  Pt is alert, follows commands appropriately, not in acute distress HEENT: No icterus, No thrush, No neck mass, [place]/AT Cardiovascular: IRRR, S1/S2, no rubs, no gallops Respiratory: left basilar crackles. No wheeze. R-CTA Abdomen: Soft/+BS, non tender, non distended, no guarding Extremities: trace LEedema, No lymphangitis, No petechiae, No rashes, no synovitis   Data Reviewed: I have personally reviewed following labs and imaging studies Basic Metabolic Panel: Recent Labs  Lab 11/20/20 1550 11/21/20 0431 11/22/20 0500 11/23/20 0459 11/24/20 0610  NA 132* 136 137 140 134*  K 3.5 3.1* 3.4* 3.8 4.2  CL 93* 95* 97* 101 95*  CO2 28 32 32 29 30  GLUCOSE 115* 92 91 94 124*  BUN 29* 24* '20 12 13  '$ CREATININE 0.98 0.87 0.84 0.81 1.32*  CALCIUM 8.4* 8.3* 8.4* 8.4* 8.4*  MG  --  2.0 2.0 2.0 1.7  PHOS  --   --   --   --  2.4*   Liver Function Tests: Recent Labs  Lab 11/20/20 1550 11/21/20 0431 11/22/20 0500  AST 32 23 20  ALT '18 17 18  '$ ALKPHOS 78 70 73  BILITOT 1.5* 1.6* 1.6*  PROT 6.8 5.9* 6.2*  ALBUMIN 3.2* 2.8* 2.9*   Recent Labs  Lab 11/20/20 1550  LIPASE 21   No results for input(s): AMMONIA in the last 168 hours. Coagulation Profile: Recent Labs  Lab 11/21/20 0431 11/22/20 0500 11/24/20 0610  INR 1.9* 1.5* 1.4*   CBC: Recent Labs  Lab 11/20/20 1550 11/21/20 0431 11/22/20 0500 11/24/20 0610 11/25/20 0601  WBC 9.0 7.6 8.6 8.7 7.8  NEUTROABS 7.1 5.8  --   --   --   HGB 13.5 12.3* 12.6* 13.0 11.8*  HCT 41.6 38.4* 39.7 41.7 36.6*  MCV 93.9 94.1 95.4 98.1 95.8  PLT 192 176 158 181 180   Cardiac Enzymes: No results for  input(s): CKTOTAL, CKMB, CKMBINDEX, TROPONINI in the last 168 hours. BNP: Invalid input(s): POCBNP CBG: No results for input(s): GLUCAP in the last 168 hours. HbA1C: No results for input(s): HGBA1C in the last 72 hours. Urine analysis: No results found for: COLORURINE, APPEARANCEUR, LABSPEC, PHURINE, GLUCOSEU, HGBUR, BILIRUBINUR, KETONESUR, PROTEINUR, UROBILINOGEN, NITRITE, LEUKOCYTESUR Sepsis Labs: '@LABRCNTIP'$ (procalcitonin:4,lacticidven:4) ) Recent Results (from the past 240 hour(s))  Resp Panel by RT-PCR (Flu A&B, Covid) Nasopharyngeal Swab     Status: None   Collection Time: 11/20/20  3:31 PM   Specimen: Nasopharyngeal Swab; Nasopharyngeal(NP) swabs in vial transport medium  Result Value Ref Range Status   SARS Coronavirus 2 by RT PCR NEGATIVE NEGATIVE Final    Comment: (NOTE) SARS-CoV-2 target nucleic acids are NOT DETECTED.  The  SARS-CoV-2 RNA is generally detectable in upper respiratory specimens during the acute phase of infection. The lowest concentration of SARS-CoV-2 viral copies this assay can detect is 138 copies/mL. A negative result does not preclude SARS-Cov-2 infection and should not be used as the sole basis for treatment or other patient management decisions. A negative result may occur with  improper specimen collection/handling, submission of specimen other than nasopharyngeal swab, presence of viral mutation(s) within the areas targeted by this assay, and inadequate number of viral copies(<138 copies/mL). A negative result must be combined with clinical observations, patient history, and epidemiological information. The expected result is Negative.  Fact Sheet for Patients:  EntrepreneurPulse.com.au  Fact Sheet for Healthcare Providers:  IncredibleEmployment.be  This test is no t yet approved or cleared by the [place] FDA and  has been authorized for detection and/or diagnosis of SARS-CoV-2 by FDA under an  Emergency Use Authorization (EUA). This EUA will remain  in effect (meaning this test can be used) for the duration of the COVID-19 declaration under Section 564(b)(1) of the Act, 21 U.S.C.section 360bbb-3(b)(1), unless the authorization is terminated  or revoked sooner.       Influenza A by PCR NEGATIVE NEGATIVE Final   Influenza B by PCR NEGATIVE NEGATIVE Final    Comment: (NOTE) The Xpert Xpress SARS-CoV-2/FLU/RSV plus assay is intended as an aid in the diagnosis of influenza from Nasopharyngeal swab specimens and should not be used as a sole basis for treatment. Nasal washings and aspirates are unacceptable for Xpert Xpress SARS-CoV-2/FLU/RSV testing.  Fact Sheet for Patients: EntrepreneurPulse.com.au  Fact Sheet for Healthcare Providers: IncredibleEmployment.be  This test is not yet approved or cleared by the [place] FDA and has been authorized for detection and/or diagnosis of SARS-CoV-2 by FDA under an Emergency Use Authorization (EUA). This EUA will remain in effect (meaning this test can be used) for the duration of the COVID-19 declaration under Section 564(b)(1) of the Act, 21 U.S.C. section 360bbb-3(b)(1), unless the authorization is terminated or revoked.  Performed at [facility], [address], [place], [place] [address]      Scheduled Meds:  amiodarone  150 mg Intravenous Once   Chlorhexidine Gluconate Cloth  6 each Topical Daily   fluticasone furoate-vilanterol  1 puff Inhalation Daily   And   umeclidinium bromide  1 puff Inhalation Daily   heparin  5,000 Units Subcutaneous Q8H   lidocaine  1 application Other Once   nicotine  21 mg Transdermal Daily   phosphorus  500 mg Oral BID   simvastatin  20 mg Oral q1800   triamcinolone cream  1 application Topical TID   Warfarin - Pharmacist Dosing Inpatient   Does not apply q1600   Continuous Infusions:  amiodarone     Followed by   amiodarone       Procedures/Studies: DG Abd 1 View  Result Date: 11/22/2020 CLINICAL DATA:  Small-bowel obstruction. EXAM: ABDOMEN - 1 VIEW COMPARISON:  11/21/2020 and CT on 11/20/2020 FINDINGS: Nasogastric tube extends into the proximal stomach and terminates in the region of the fundus. Multiple dilated loops of small bowel again noted with similar caliber compared to the prior CT. Maximal small bowel caliber is approximately 5 cm. No evidence of free intraperitoneal air or pneumatosis. There is some sparse air scattered in the colon and findings are consistent with partial small bowel obstruction. IMPRESSION: 1. Stable nasogastric tube positioning with the tip terminating in the region of the fundus. 2. Similar appearance dilated small bowel and  sparse air in the colon consistent with partial small bowel obstruction. No evidence of free air. Electronically Signed   By: Aletta Edouard M.D.   On: 11/22/2020 08:56   DG Abd 1 View  Result Date: 11/21/2020 CLINICAL DATA:  NG tube placement EXAM: ABDOMEN - 1 VIEW COMPARISON:  11/21/2020 FINDINGS: NG tube in the upper abdomen, NG tube tip in the fundus of the stomach. Dilated bowel loops again noted as seen on CT compatible with small bowel obstruction. IMPRESSION: NG tube tip in the fundus of the stomach. Electronically Signed   By: Rolm Baptise M.D.   On: 11/21/2020 19:09   DG Abd 1 View  Result Date: 11/21/2020 CLINICAL DATA:  NG tube placement EXAM: ABDOMEN - 1 VIEW COMPARISON:  11/21/2020 FINDINGS: NG tube tip is in the upper thoracic esophagus near the thoracic inlet. Cardiomegaly. Bibasilar atelectasis. Small left effusion. IMPRESSION: NG tube tip in the upper thoracic esophagus near the thoracic inlet. Electronically Signed   By: Rolm Baptise M.D.   On: 11/21/2020 18:05   DG Abd 1 View  Result Date: 11/21/2020 CLINICAL DATA:  70 year old male with small bowel obstruction. EXAM: ABDOMEN - 1 VIEW COMPARISON:  CT Abdomen and Pelvis 11/20/2020. FINDINGS:  Portable AP supine view at 0443 hours. Small-bowel obstruction pattern persists, and has not significantly changed. Some of the gas-filled small bowel loops may be slightly less dilated since yesterday. Excreted IV contrast in the urinary bladder. No enteric tube identified. Stable elevated left hemidiaphragm. No acute osseous abnormality identified. IMPRESSION: Small-bowel obstruction gas pattern not significantly changed since yesterday. Electronically Signed   By: Genevie Ann M.D.   On: 11/21/2020 06:09   CT ABDOMEN PELVIS W CONTRAST  Result Date: 11/20/2020 CLINICAL DATA:  Acute abdominal pain.  Vomiting. EXAM: CT ABDOMEN AND PELVIS WITH CONTRAST TECHNIQUE: Multidetector CT imaging of the abdomen and pelvis was performed using the standard protocol following bolus administration of intravenous contrast. CONTRAST:  171m OMNIPAQUE IOHEXOL 350 MG/ML SOLN COMPARISON:  CT abdomen 04/22/2018 FINDINGS: Lower chest: Atelectasis LEFT lung base. Elevated LEFT hemidiaphragm diaphragm Hepatobiliary: Simple fluid attenuation lesion in the LEFT hepatic lobe not changed from prior. Probable collection of small benign cysts. No biliary duct dilatation. Gallbladder normal. Suggestion of sludge within the gallbladder. Pancreas: Pancreas is normal. No ductal dilatation. No pancreatic inflammation. Spleen: Normal spleen Adrenals/urinary tract: Adrenal glands and kidneys are normal. The ureters and bladder normal. Stomach/Bowel: Stomach is normal.  Duodenum is normal. There is laxity of the abdominal wall such that the entirety of the abdomen is not imaged. There are dilated loops of small bowel within the mid jejunum and ileum measuring up to 5.4 cm (coronal image 23/series 5) the small bowel is collapsed leading up to the terminal ileum. Appendix normal. No transitional point is identified No pneumatosis or portal venous gas. Small volume stool in the colon. Gas throughout colon. Gas in the rectum. Vascular/Lymphatic: Abdominal  aorta is normal caliber with atherosclerotic calcification. There is no retroperitoneal or periportal lymphadenopathy. No pelvic lymphadenopathy. Reproductive: Prostate un remark Other: No intraperitoneal free air.  No intraperitoneal free fluid Musculoskeletal: No aggressive osseous lesion. IMPRESSION: 1. Small bowel obstruction pattern with multiple dilated loops of mid small bowel and collapsed distal ileum. No discrete transition point identified. 2. No pneumatosis or portal venous gas identified.  No perforation 3. Stool and gas in the colon suggests early obstruction. Electronically Signed   By: SSuzy BouchardM.D.   On: 11/20/2020 17:32   DG Abd  Portable 1V-Small Bowel Obstruction Protocol-initial, 8 hr delay  Result Date: 11/22/2020 CLINICAL DATA:  Small bowel obstruction EXAM: PORTABLE ABDOMEN - 1 VIEW COMPARISON:  11/22/2020 FINDINGS: Continued gaseous distention of bowel. This appears to involve both large and small bowel raising the possibility of ileus. No free air organomegaly. IMPRESSION: Continued gaseous distention of bowel, both large and small bowel currently noted. Question ileus versus bowel obstruction. Electronically Signed   By: Rolm Baptise M.D.   On: 11/22/2020 18:25    Orson Eva, DO  [facility]  If 7PM-7AM, please contact night-coverage [email or web address] Password [email or web address] 11/25/2020, 8:01 AM   LOS: 4 days

## 2020-11-25 NOTE — Progress Notes (Addendum)
Cross-coverage note:   Notified of asymptomatic hypotension. He was given IV fluid boluses and started on IV amiodarone earlier today and HR and BP have improved. Plan to give albumin, continue close monitoring.   Addendum: Pt remains hypotensive despite fluid boluses and albumin, continues to be asymptomatic. Lactic acid was normal and H&H stable. HR low 100s. Plan to start phenylephrine.

## 2020-11-25 NOTE — Progress Notes (Signed)
  Subjective: Patient transferred to the intensive care unit yesterday evening with rapid atrial fibrillation.  Patient states he has no abdominal pain, nausea, or vomiting.  He is continuing to have bowel movements and tolerating regular diet well.  Objective: Vital signs in last 24 hours: Temp:  [97.6 F (36.4 C)-99.1 F (37.3 C)] 97.8 F (36.6 C) (08/14 0736) Pulse Rate:  [85-146] 138 (08/14 0736) Resp:  [18-29] 27 (08/14 0736) BP: (56-120)/(34-90) 107/43 (08/14 0641) SpO2:  [88 %-98 %] 96 % (08/14 0742) Weight:  [169.4 kg] 169.4 kg (08/14 0641) Last BM Date: 11/23/20  Intake/Output from previous day: 08/13 0701 - 08/14 0700 In: 2177.3 [P.O.:525; IV Piggyback:1652.3] Out: 250 [Urine:250] Intake/Output this shift: No intake/output data recorded.  General appearance: alert, cooperative, and no distress GI: soft, non-tender; bowel sounds normal; no masses,  no organomegaly  Lab Results:  Recent Labs    11/25/20 0601 11/25/20 0822  WBC 7.8 7.2  HGB 11.8* 12.0*  HCT 36.6* 37.6*  PLT 180 175   BMET Recent Labs    11/24/20 0610 11/25/20 0557  NA 134* 130*  K 4.2 3.7  CL 95* 98  CO2 30 29  GLUCOSE 124* 100*  BUN 13 27*  CREATININE 1.32* 1.60*  CALCIUM 8.4* 7.2*   PT/INR Recent Labs    11/24/20 0610 11/25/20 0557  LABPROT 17.4* 19.7*  INR 1.4* 1.7*    Studies/Results: No results found.  Anti-infectives: Anti-infectives (From admission, onward)    None       Assessment/Plan: Impression: Ileus, resolved.  Now in the intensive care unit with uncontrolled atrial fibrillation.  This is being managed by Dr. Carles Collet. Plan: Nothing to add from the surgery standpoint.  We will follow peripherally with you.  LOS: 4 days    Aviva Signs 11/25/2020

## 2020-11-26 ENCOUNTER — Encounter (HOSPITAL_COMMUNITY): Payer: Self-pay | Admitting: Internal Medicine

## 2020-11-26 ENCOUNTER — Inpatient Hospital Stay (HOSPITAL_COMMUNITY): Payer: Medicare Other

## 2020-11-26 ENCOUNTER — Telehealth: Payer: Self-pay | Admitting: Cardiology

## 2020-11-26 ENCOUNTER — Inpatient Hospital Stay: Payer: Self-pay

## 2020-11-26 DIAGNOSIS — I4891 Unspecified atrial fibrillation: Secondary | ICD-10-CM | POA: Diagnosis not present

## 2020-11-26 LAB — URINALYSIS, COMPLETE (UACMP) WITH MICROSCOPIC
Bacteria, UA: NONE SEEN
Bilirubin Urine: NEGATIVE
Glucose, UA: NEGATIVE mg/dL
Hgb urine dipstick: NEGATIVE
Ketones, ur: NEGATIVE mg/dL
Leukocytes,Ua: NEGATIVE
Nitrite: NEGATIVE
Protein, ur: NEGATIVE mg/dL
Specific Gravity, Urine: 1.017 (ref 1.005–1.030)
pH: 5 (ref 5.0–8.0)

## 2020-11-26 LAB — COMPREHENSIVE METABOLIC PANEL
ALT: 13 U/L (ref 0–44)
AST: 17 U/L (ref 15–41)
Albumin: 2.6 g/dL — ABNORMAL LOW (ref 3.5–5.0)
Alkaline Phosphatase: 59 U/L (ref 38–126)
Anion gap: 9 (ref 5–15)
BUN: 29 mg/dL — ABNORMAL HIGH (ref 8–23)
CO2: 28 mmol/L (ref 22–32)
Calcium: 7.8 mg/dL — ABNORMAL LOW (ref 8.9–10.3)
Chloride: 97 mmol/L — ABNORMAL LOW (ref 98–111)
Creatinine, Ser: 1.09 mg/dL (ref 0.61–1.24)
GFR, Estimated: >60 mL/min (ref >60)
Glucose, Bld: 150 mg/dL — ABNORMAL HIGH (ref 70–99)
Potassium: 3.9 mmol/L (ref 3.5–5.1)
Sodium: 134 mmol/L — ABNORMAL LOW (ref 135–145)
Total Bilirubin: 1.1 mg/dL (ref 0.3–1.2)
Total Protein: 6.1 g/dL — ABNORMAL LOW (ref 6.5–8.1)

## 2020-11-26 LAB — ECHOCARDIOGRAM COMPLETE
AR max vel: 2.02 cm2
AV Area VTI: 1.74 cm2
AV Area mean vel: 1.73 cm2
AV Mean grad: 5 mmHg
AV Peak grad: 8.9 mmHg
Ao pk vel: 1.49 m/s
Area-P 1/2: 3.97 cm2
Height: 73 in
MV VTI: 1.57 cm2
S' Lateral: 2.88 cm
Weight: 5975.35 oz

## 2020-11-26 LAB — PROTIME-INR
INR: 1.9 — ABNORMAL HIGH (ref 0.8–1.2)
Prothrombin Time: 22 seconds — ABNORMAL HIGH (ref 11.4–15.2)

## 2020-11-26 LAB — CBC
HCT: 39.3 % (ref 39.0–52.0)
Hemoglobin: 12.5 g/dL — ABNORMAL LOW (ref 13.0–17.0)
MCH: 30.6 pg (ref 26.0–34.0)
MCHC: 31.8 g/dL (ref 30.0–36.0)
MCV: 96.1 fL (ref 80.0–100.0)
Platelets: 253 10*3/uL (ref 150–400)
RBC: 4.09 MIL/uL — ABNORMAL LOW (ref 4.22–5.81)
RDW: 15 % (ref 11.5–15.5)
WBC: 7.1 10*3/uL (ref 4.0–10.5)
nRBC: 0 % (ref 0.0–0.2)

## 2020-11-26 LAB — PHOSPHORUS: Phosphorus: 4.2 mg/dL (ref 2.5–4.6)

## 2020-11-26 LAB — MAGNESIUM: Magnesium: 2 mg/dL (ref 1.7–2.4)

## 2020-11-26 MED ORDER — WARFARIN SODIUM 5 MG PO TABS
10.0000 mg | ORAL_TABLET | Freq: Once | ORAL | Status: AC
  Administered 2020-11-26: 10 mg via ORAL
  Filled 2020-11-26: qty 2

## 2020-11-26 MED ORDER — SODIUM CHLORIDE 0.9% FLUSH: 10.0000 mL | INTRAVENOUS | Status: DC | PRN

## 2020-11-26 MED ORDER — SODIUM CHLORIDE 0.9 % IV SOLN
250.0000 mL | INTRAVENOUS | Status: DC
  Administered 2020-11-26: 250 mL via INTRAVENOUS

## 2020-11-26 MED ORDER — DIGOXIN 0.25 MG/ML IJ SOLN
0.5000 mg | Freq: Once | INTRAMUSCULAR | Status: AC
  Administered 2020-11-26: 0.5 mg via INTRAVENOUS
  Filled 2020-11-26: qty 2

## 2020-11-26 MED ORDER — SODIUM CHLORIDE 0.9 % IV SOLN
2.0000 g | Freq: Three times a day (TID) | INTRAVENOUS | Status: DC
  Administered 2020-11-26 – 2020-11-27 (×3): 2 g via INTRAVENOUS
  Filled 2020-11-26 (×3): qty 2

## 2020-11-26 MED ORDER — PHENYLEPHRINE HCL-NACL 20-0.9 MG/250ML-% IV SOLN
25.0000 ug/min | INTRAVENOUS | Status: DC
  Administered 2020-11-26: 25 ug/min via INTRAVENOUS
  Administered 2020-11-26: 55 ug/min via INTRAVENOUS
  Administered 2020-11-26: 95 ug/min via INTRAVENOUS
  Filled 2020-11-26 (×3): qty 250

## 2020-11-26 MED ORDER — SODIUM CHLORIDE 0.9% FLUSH
10.0000 mL | Freq: Two times a day (BID) | INTRAVENOUS | Status: DC
  Administered 2020-11-26 (×2): 10 mL
  Administered 2020-11-27: 30 mL

## 2020-11-26 NOTE — Telephone Encounter (Signed)
Spoke to pt's daughter and clarified that pt was seen by hospitalist and Ermalinda Barrios, PA-C this morning. Medications were ordered for pt's heart rate. Pt's daughter made aware that he is being followed by cardiology and hospitalist in the ICU. Pt's heart rate issue was addressed and best to direct questions to ICU team v. Dr's office. She inquired about Dr. Harl Bowie, and was informed that he is currently in the [facility] office and that pt's care is followed closely by ICU hospitalist and on call locum.

## 2020-11-26 NOTE — Consult Note (Signed)
Cardiology Consultation:   Patient ID: Jesus Rodriguez MRN: [id number]; DOB: October 30, 1950  Admit date: 11/20/2020 Date of Consult: 11/26/2020  PCP:  Patient, [name] (Inactive)   [facility] Providers Cardiologist:  Carlyle Dolly, MD  Electrophysiologist:  Thompson Grayer, MD       Patient Profile:   Jesus Rodriguez is a 70 y.o. male with a hx of Aflutter ablation 2017, Afib who is being seen 11/26/2020 for the evaluation of Afib with RVR at the request of Dr. Carles Collet.  History of Present Illness:   Jesus Rodriguez is a 70 yo male patient with history HTN, HLD, of Afib ablation 2017 previously followed by [facility] but now Dr. Rayann Rodriguez, failed sotalol, and Tikosyn, plan for rate control, chronic diastolic CHF, COPD, OSA on CPAP.  Patient admitted with ileus and was NPO and developed rapid AFib with hypotension refractory to IV fluid bolus. Was started on IV Amiodarone and phenylephrine. Now taking po and was restarted on  warfarin. Patient says BP usually comes up when he takes metoprolol and HR controlled. Denies and cardiac complaints. Sitting up in bed eating breakfast.  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 Authorizing Provider  acetaminophen (TYLENOL) 500 MG tablet Take 500 mg by mouth every 6 (six) hours as needed.   Yes [provider]  albuterol (VENTOLIN HFA) 108 (90 Base) MCG/ACT inhaler Inhale 1 puff into the lungs every 6 (six) hours as needed. 11/10/19  Yes [provider]  benazepril (LOTENSIN) 10 MG tablet Take 1 tablet (10 mg total) by mouth daily. 07/13/20  Yes Allred, Jeneen Rinks, MD  doxycycline (VIBRAMYCIN) 100 MG capsule Take 100 mg by mouth daily. 02/25/20  Yes [provider]  ketoconazole (NIZORAL) 2 % cream Apply 1 application topically 2 (two) times daily as needed for irritation. 03/27/20  Yes [provider]  Metoprolol Tartrate 75 MG TABS TAKE 1 TABLET TWICE A DAY 06/27/20  Yes Branch, Alphonse Guild, MD  simvastatin (ZOCOR) 20 MG tablet TAKE 1 TABLET BY MOUTH DAILY AT 6 PM. 09/21/20  Yes Branch, Alphonse Guild, MD  tamsulosin (FLOMAX) 0.4 MG CAPS capsule Take by mouth daily. 07/23/17  Yes [provider]  torsemide (DEMADEX) 20 MG tablet Take 3 tablets (60 mg total) by mouth 2 (two) times daily. 07/13/20  Yes Allred, Jeneen Rinks, MD  TRELEGY ELLIPTA 100-62.5-25 MCG/INH AEPB Inhale 1 puff into the lungs daily. 04/02/20  Yes [provider]  triamcinolone (KENALOG) 0.1 % Apply 1 application topically 3 (three) times daily. 02/25/20  Yes [provider]  warfarin (COUMADIN) 10 MG tablet TAKE 1/2 TO 1 TABLET DAILY AS DIRECTED  BY COUMADIN CLINIC. Patient taking differently: Take 5-10 mg by mouth as directed. Take 1/2 to 1 tablet daily as directed by coumadin clinic. 05/31/20  Yes BranchAlphonse Guild, MD    Inpatient Medications: Scheduled Meds:  Chlorhexidine Gluconate Cloth  6 each Topical Daily   fluticasone furoate-vilanterol  1 puff Inhalation Daily   And   umeclidinium bromide  1 puff Inhalation Daily   heparin  5,000 Units Subcutaneous Q8H   hydrocortisone sod succinate (SOLU-CORTEF) inj  100 mg  Intravenous Q8H   lidocaine  1 application Other Once   nicotine  21 mg Transdermal Daily   phosphorus  500 mg Oral BID   simvastatin  20 mg Oral q1800   triamcinolone cream  1 application Topical TID   Warfarin - Pharmacist Dosing Inpatient   Does not apply q1600   Continuous Infusions:  sodium chloride Stopped (11/26/20 0512)   amiodarone 30 mg/hr (11/26/20 0641)   ceFEPime (MAXIPIME) IV Stopped (11/25/20 2311)   phenylephrine (NEO-SYNEPHRINE) Adult infusion 95 mcg/min (11/26/20 0641)   vancomycin 150 mL/hr at 11/26/20 0641   PRN Meds: acetaminophen **OR** acetaminophen, albuterol, HYDROmorphone (DILAUDID) injection, ondansetron **OR** ondansetron (ZOFRAN) IV, phenol  Allergies:    Allergies  Allergen Reactions   Celebrex [Celecoxib]    Methotrexate Derivatives Other (See Comments)    Arrhythmia    Sulfa Antibiotics     Social History:   Social History   Socioeconomic History   Marital status: Divorced    Spouse name: Not on file   Number of children: Not on file   Years of education: Not on file   Highest education level: Not on file  Occupational History   Not on file  Tobacco Use   Smoking status: Every Day    Packs/day: 2.00    Years: 35.00    Pack years: 70.00    Types: Cigarettes    Start date: 05/28/1971   Smokeless tobacco: Never  Vaping Use   Vaping Use: Never used  Substance and Sexual Activity   Alcohol use: No   Drug use: No   Sexual activity: Not on file  Other Topics Concern   Not on file  Social History Narrative   Not on file   Social Determinants of Health   Financial Resource Strain: Not on file  Food Insecurity: Not on file  Transportation Needs: Not on file  Physical Activity: Not on file  Stress: Not on file  Social Connections: Not on file  Intimate Partner Violence: Not on file    Family History:     Family History  Problem Relation Age of Onset   Hypertension Father    Heart attack Father    Prostate cancer Father     Hypertension Sister    Hypertension Sister    Hypertension Sister    Epilepsy Son    Diabetes Son      ROS:  Please see the history of present illness.  Review of Systems  Constitutional: Negative.  HENT: Negative.    Cardiovascular:  Positive for dyspnea on exertion and leg swelling.  Respiratory: Negative.    Endocrine: Negative.   Hematologic/Lymphatic: Negative.   Musculoskeletal: Negative.   Gastrointestinal: Negative.   Genitourinary: Negative.   Neurological: Negative.    All other ROS reviewed and negative.     Physical Exam/Data:   Vitals:   11/26/20 0600 11/26/20 0615 11/26/20 0630 11/26/20 0700  BP: (!) 88/44 (!) 84/42 (!) 92/48   Pulse: 95 (!) 117 95  Resp: '17 17 18   '$ Temp:    98 F (36.7 C)  TempSrc:    Oral  SpO2: 94% 96% 96%   Weight:      Height:        Intake/Output Summary (Last 24 hours) at 11/26/2020 0825 Last data filed at 11/26/2020 0641 Gross per 24 hour  Intake 2935.34 ml  Output 1400 ml  Net 1535.34 ml   Last 3 Weights 11/25/2020 11/20/2020 11/15/2020  Weight (lbs) 373 lb 7.4 oz 370 lb 6 oz 365 lb 6.4 oz  Weight (kg) 169.4 kg 168 kg 165.744 kg     Body mass index is 49.27 kg/m.  General:  Obese, in no acute distress  HEENT: normal Lymph: no adenopathy Neck: no JVD Endocrine:  No thryomegaly Vascular: No carotid bruits; FA pulses 2+ bilaterally without bruits  Cardiac:  normal S1, S2; irreg irreg no murmur   Lungs:  clear to auscultation bilaterally, no wheezing, rhonchi or rales  Abd: soft, nontender, no hepatomegaly  Ext: brawny changes from chronic edema Musculoskeletal:  No deformities, BUE and BLE strength normal and equal Skin: warm and dry  Neuro:  CNs 2-12 intact, no focal abnormalities noted Psych:  Normal affect   EKG:  The EKG was personally reviewed and demonstrates:  Afib at 77/m with PVC's Telemetry:  Telemetry was personally reviewed and demonstrates:  Afib 103-117 with PVC's, couplets  Relevant CV Studies: 10/2018  echo  1. Images are limited.  2. The left ventricle has grossly normal systolic function, with an ejection fraction of 55-60%. The cavity size was normal. There is mildly increased left ventricular wall thickness. Left ventricular diastolic Doppler parameters are indeterminate.  3. The right ventricle has normal systolic function. The cavity was normal. There is no increase in right ventricular wall thickness. Right ventricular systolic pressure is normal with an estimated pressure of 12.5 mmHg.  4. The aortic valve is tricuspid. Mild calcification of the aortic valve. Mild aortic annular calcification noted.  5. The mitral valve is grossly normal. There is mild mitral annular calcification present.  6. The tricuspid valve is grossly normal.  7. The aorta is abnormal in size and structure.  8. There is mild dilatation of the aortic root.  9. The interatrial septum was not well visualized  Laboratory Data:  High Sensitivity Troponin:  No results for input(s): TROPONINIHS in the last 720 hours.   Chemistry Recent Labs  Lab 11/24/20 0610 11/25/20 0557 11/26/20 0422  NA 134* 130* 134*  K 4.2 3.7 3.9  CL 95* 98 97*  CO2 '30 29 28  '$ GLUCOSE 124* 100* 150*  BUN 13 27* 29*  CREATININE 1.32* 1.60* 1.09  CALCIUM 8.4* 7.2* 7.8*  GFRNONAA 58* 46* >60  ANIONGAP 9 3* 9    Recent Labs  Lab 11/21/20 0431 11/22/20 0500 11/26/20 0422  PROT 5.9* 6.2* 6.1*  ALBUMIN 2.8* 2.9* 2.6*  AST '23 20 17  '$ ALT '17 18 13  '$ ALKPHOS 70 73 59  BILITOT 1.6* 1.6* 1.1   Hematology Recent Labs  Lab 11/25/20 0601 11/25/20 0822 11/26/20 0422  WBC 7.8 7.2 7.1  RBC 3.82* 3.93* 4.09*  HGB 11.8* 12.0* 12.5*  HCT 36.6* 37.6* 39.3  MCV 95.8 95.7 96.1  MCH 30.9 30.5 30.6  MCHC 32.2 31.9 31.8  RDW 15.2 15.0 15.0  PLT 180 175 253   BNP Recent Labs  Lab 11/24/20 0610  BNP 164.0*    DDimer No results for input(s): DDIMER in the last 168  hours.   Radiology/Studies:  DG Abd Portable 1V-Small Bowel  Obstruction Protocol-initial, 8 hr delay  Result Date: 11/22/2020 CLINICAL DATA:  Small bowel obstruction EXAM: PORTABLE ABDOMEN - 1 VIEW COMPARISON:  11/22/2020 FINDINGS: Continued gaseous distention of bowel. This appears to involve both large and small bowel raising the possibility of ileus. No free air organomegaly. IMPRESSION: Continued gaseous distention of bowel, both large and small bowel currently noted. Question ileus versus bowel obstruction. Electronically Signed   By: Rolm Baptise M.D.   On: 11/22/2020 18:25   [place] EKG SITE RITE  Result Date: 11/26/2020 If Site Rite image not attached, placement could not be confirmed due to current cardiac rhythm.    Assessment and Plan:   Persistent Afib with RVR when NPO for SBO/ileus. Followed by Dr. Rayann Rodriguez and plan for rate control with metoprolol after failing dolfetilide and sotolal. On Coumadin. Currently on IV Amio 30 mg/hr and phenylephrine. HR low 100's. Hopefully can wean phenylephrine and get back on metorolol today.  HTN now with hypotension in setting of rapid Afib and ileus. Previously on lotensin and demadex as OP-on hold. Now that he's eating and BP up hopefully can wean phenylephrine and restart metoprolol-was on 75 mg bid PTA.   Morbid Obesity  OSA on CPAP  COPD   Risk Assessment/Risk Scores:        New York Heart Association (NYHA) Functional Class NYHA Class II  CHA2DS2-VASc Score = 3  This indicates a 3.2% annual risk of stroke. The patient's score is based upon: CHF History: Yes HTN History: Yes Diabetes History: No Stroke History: No Vascular Disease History: No Age Score: 1 Gender Score: 0        For questions or updates, please contact [facility] Please consult [email or web address] for contact info under    Signed, Ermalinda Barrios, PA-C  11/26/2020 8:25 AM

## 2020-11-26 NOTE — Progress Notes (Addendum)
PROGRESS NOTE  Jesus Rodriguez [id number] DOB: [birth year] DOA: 11/20/2020 PCP: Patient, [name] (Inactive)  Brief History:  70 year old male with a history of hypertension, hyperlipidemia, COPD, diastolic CHF, atrial fibrillation, rheumatoid arthritis, depression, scrotal carcinoma presenting with 4-day history of abdominal pain with nausea and vomiting.  CT in the ED showed small bowel obstruction pattern with multiple dilated loops of small bowel and collapsed distal ileum.  The patient was placed on IV fluids and bowel rest.  General surgery was consulted to assist with management.  Since his hospitalization, the patient has developed some atrial fibrillation with RVR.  His bowel function gradually improved.  He began having bowel movements.  His diet was advanced which she has been tolerating. On 11/24/20 evening, patient developed Afib with RVR with hypotension refractory to IVF bolus.  He was transferred to SDU.   Assessment/Plan:   Small bowel obstruction -Appreciate general surgery follow-up -Patient initially required NG decompression -Patient had 3 bowel movements in 24 hours 8/13 -Diet advancement per general surgery -clears>>ull liquids>>heart healthy diet on 8/14 -Patient was appropriately fluid resuscitated -overall improved -now tolerating diet   Atrial fibrillation with RVR, persistent -Patient was n.p.o. limiting intake of his p.o. medications and physiologic stress also contributed -Restart metoprolol tartrate at lower dose -Restart warfarin - Previously followed by [facility].  Failed sotalol, started on dofetilide, followed by Dr. Rayann Heman.  Previous ablation 2017 -No longer on dofetilide -11/24/20 evening--developed RVR with hypotension refractory to IVF boluses -started amiodarone 8/14 due to soft BP and elevated creatinine to help with rate control -8/15--appreciate cardiology consult>>gave digoxin, wean off amio, wean off neosynephrine -PICC line placement  for neosynphrine 8/15   COPD/chronic respiratory failure with hypoxia -Patient is on 4 L nasal cannula at home -Currently with oxygen saturation 92% on 4 L -Continue bronchodilators  Hypotension -started IV solucortef -lactic acid 0.8 -PCT 0.41 -blood cultures x 2 sets -started empiric IV vanco and cefepime    AKI -due to volume depletion, possible component of ATN from soft BPs -baseline creatinine 0.8-0.9 -serum creatinine peaking 1.60 -hold torsemide temporatrily   Essential hypertension -Holding benazepril secondary to soft blood pressure -holding metoprolol tartrate due to hypotension   Hyperlipidemia -Continue Zocor   Chronic diastolic CHF -123456 echo EF 55 to 60%, indeterminate diastolic function, normal RV -Normally on torsemide 60 mg twice daily which will be restarted when hemodynamically stable   OSA -Patient is normally on CPAP at home with which she is compliant   Tobacco abuse -Tobacco cessation discussed   Hypophosphatemia -replete   Morbid obesity -BMI 48.86 -Lifestyle modification             Status is: Inpatient   Remains inpatient appropriate because:Inpatient level of care appropriate due to severity of illness   Dispo: The patient is from: Home              Anticipated d/c is to: Home              Patient currently is not medically stable to d/c.              Difficult to place patient No               Family Communication:   no Family at bedside   Consultants:  general surgery   Code Status:  FULL    DVT Prophylaxis:  coumadin     Procedures: As Listed in Progress Note Above  Antibiotics: None       The patient is critically ill with multiple organ systems failure and requires high complexity decision making for assessment and support, frequent evaluation and titration of therapies, application of advanced monitoring technologies and extensive interpretation of multiple databases.  Critical care time - 35  mins.       Subjective: Patient denies fevers, chills, headache, chest pain, dyspnea, nausea, vomiting, diarrhea, abdominal pain, dysuria, hematuria, hematochezia, and melena.   Objective: Vitals:   11/26/20 1045 11/26/20 1100 11/26/20 1115 11/26/20 1130  BP: (!) 123/53 (!) 118/57 (!) 132/45 (!) 139/44  Pulse: (!) 108 94 90 94  Resp: (!) 28 (!) '29 16 17  '$ Temp:  98.2 F (36.8 C)    TempSrc:  Oral    SpO2: 98% 95% 97% 96%  Weight:      Height:        Intake/Output Summary (Last 24 hours) at 11/26/2020 1219 Last data filed at 11/26/2020 1130 Gross per 24 hour  Intake 3490.01 ml  Output 1850 ml  Net 1640.01 ml   Weight change:  Exam:  General:  Pt is alert, follows commands appropriately, not in acute distress HEENT: No icterus, No thrush, No neck mass, [place]/AT Cardiovascular: IRRR, S1/S2, no rubs, no gallops Respiratory: bibasilar crackles. No wheeze Abdomen: Soft/+BS, non tender, non distended, no guarding Extremities: 1+ LE edema, No lymphangitis, No petechiae, No rashes, no synovitis   Data Reviewed: I have personally reviewed following labs and imaging studies Basic Metabolic Panel: Recent Labs  Lab 11/22/20 0500 11/23/20 0459 11/24/20 0610 11/25/20 0557 11/26/20 0422  NA 137 140 134* 130* 134*  K 3.4* 3.8 4.2 3.7 3.9  CL 97* 101 95* 98 97*  CO2 32 '29 30 29 28  '$ GLUCOSE 91 94 124* 100* 150*  BUN '20 12 13 '$ 27* 29*  CREATININE 0.84 0.81 1.32* 1.60* 1.09  CALCIUM 8.4* 8.4* 8.4* 7.2* 7.8*  MG 2.0 2.0 1.7 1.8 2.0  PHOS  --   --  2.4* 3.8 4.2   Liver Function Tests: Recent Labs  Lab 11/20/20 1550 11/21/20 0431 11/22/20 0500 11/26/20 0422  AST 32 '23 20 17  '$ ALT '18 17 18 13  '$ ALKPHOS 78 70 73 59  BILITOT 1.5* 1.6* 1.6* 1.1  PROT 6.8 5.9* 6.2* 6.1*  ALBUMIN 3.2* 2.8* 2.9* 2.6*   Recent Labs  Lab 11/20/20 1550  LIPASE 21   No results for input(s): AMMONIA in the last 168 hours. Coagulation Profile: Recent Labs  Lab 11/21/20 0431 11/22/20 0500  11/24/20 0610 11/25/20 0557 11/26/20 0422  INR 1.9* 1.5* 1.4* 1.7* 1.9*   CBC: Recent Labs  Lab 11/20/20 1550 11/21/20 0431 11/22/20 0500 11/24/20 0610 11/25/20 0601 11/25/20 0822 11/26/20 0422  WBC 9.0 7.6 8.6 8.7 7.8 7.2 7.1  NEUTROABS 7.1 5.8  --   --   --   --   --   HGB 13.5 12.3* 12.6* 13.0 11.8* 12.0* 12.5*  HCT 41.6 38.4* 39.7 41.7 36.6* 37.6* 39.3  MCV 93.9 94.1 95.4 98.1 95.8 95.7 96.1  PLT 192 176 158 181 180 175 253   Cardiac Enzymes: No results for input(s): CKTOTAL, CKMB, CKMBINDEX, TROPONINI in the last 168 hours. BNP: Invalid input(s): POCBNP CBG: No results for input(s): GLUCAP in the last 168 hours. HbA1C: No results for input(s): HGBA1C in the last 72 hours. Urine analysis: No results found for: COLORURINE, APPEARANCEUR, LABSPEC, PHURINE, GLUCOSEU, HGBUR, BILIRUBINUR, KETONESUR, PROTEINUR, UROBILINOGEN, NITRITE, LEUKOCYTESUR Sepsis Labs: '@LABRCNTIP'$ (procalcitonin:4,lacticidven:4) ) Recent Results (from  the past 240 hour(s))  Resp Panel by RT-PCR (Flu A&B, Covid) Nasopharyngeal Swab     Status: None   Collection Time: 11/20/20  3:31 PM   Specimen: Nasopharyngeal Swab; Nasopharyngeal(NP) swabs in vial transport medium  Result Value Ref Range Status   SARS Coronavirus 2 by RT PCR NEGATIVE NEGATIVE Final    Comment: (NOTE) SARS-CoV-2 target nucleic acids are NOT DETECTED.  The SARS-CoV-2 RNA is generally detectable in upper respiratory specimens during the acute phase of infection. The lowest concentration of SARS-CoV-2 viral copies this assay can detect is 138 copies/mL. A negative result does not preclude SARS-Cov-2 infection and should not be used as the sole basis for treatment or other patient management decisions. A negative result may occur with  improper specimen collection/handling, submission of specimen other than nasopharyngeal swab, presence of viral mutation(s) within the areas targeted by this assay, and inadequate number of  viral copies(<138 copies/mL). A negative result must be combined with clinical observations, patient history, and epidemiological information. The expected result is Negative.  Fact Sheet for Patients:  EntrepreneurPulse.com.au  Fact Sheet for Healthcare Providers:  IncredibleEmployment.be  This test is no t yet approved or cleared by the [place] FDA and  has been authorized for detection and/or diagnosis of SARS-CoV-2 by FDA under an Emergency Use Authorization (EUA). This EUA will remain  in effect (meaning this test can be used) for the duration of the COVID-19 declaration under Section 564(b)(1) of the Act, 21 U.S.C.section 360bbb-3(b)(1), unless the authorization is terminated  or revoked sooner.       Influenza A by PCR NEGATIVE NEGATIVE Final   Influenza B by PCR NEGATIVE NEGATIVE Final    Comment: (NOTE) The Xpert Xpress SARS-CoV-2/FLU/RSV plus assay is intended as an aid in the diagnosis of influenza from Nasopharyngeal swab specimens and should not be used as a sole basis for treatment. Nasal washings and aspirates are unacceptable for Xpert Xpress SARS-CoV-2/FLU/RSV testing.  Fact Sheet for Patients: EntrepreneurPulse.com.au  Fact Sheet for Healthcare Providers: IncredibleEmployment.be  This test is not yet approved or cleared by the [place] FDA and has been authorized for detection and/or diagnosis of SARS-CoV-2 by FDA under an Emergency Use Authorization (EUA). This EUA will remain in effect (meaning this test can be used) for the duration of the COVID-19 declaration under Section 564(b)(1) of the Act, 21 U.S.C. section 360bbb-3(b)(1), unless the authorization is terminated or revoked.  Performed at [facility], [address] [place], [place] [address]   MRSA Next Gen by PCR, Nasal     Status: None   Collection Time: 11/25/20  6:31 AM   Specimen: Nasal Mucosa; Nasal Swab   Result Value Ref Range Status   MRSA by PCR Next Gen NOT DETECTED NOT DETECTED Final    Comment: (NOTE) The GeneXpert MRSA Assay (FDA approved for NASAL specimens only), is one component of a comprehensive MRSA colonization surveillance program. It is not intended to diagnose MRSA infection nor to guide or monitor treatment for MRSA infections. Test performance is not FDA approved in patients less than 60 years old. Performed at [facility], [address] [place], [place] [address]   Culture, blood (Routine X 2) w Reflex to ID Panel     Status: None (Preliminary result)   Collection Time: 11/25/20  8:17 AM   Specimen: Right Antecubital; Blood  Result Value Ref Range Status   Specimen Description   Final    RIGHT ANTECUBITAL BOTTLES DRAWN AEROBIC AND ANAEROBIC  Special Requests   Final    Blood Culture results may not be optimal due to an excessive volume of blood received in culture bottles Performed at [facility], [address] [place], [place] [address]    Culture PENDING  Incomplete   Report Status PENDING  Incomplete  Culture, blood (Routine X 2) w Reflex to ID Panel     Status: None (Preliminary result)   Collection Time: 11/25/20  8:28 AM   Specimen: BLOOD RIGHT HAND  Result Value Ref Range Status   Specimen Description   Final    BLOOD RIGHT HAND BOTTLES DRAWN AEROBIC AND ANAEROBIC   Special Requests   Final    Blood Culture results may not be optimal due to an excessive volume of blood received in culture bottles Performed at [facility], [address], [place], [place] [address]    Culture PENDING  Incomplete   Report Status PENDING  Incomplete     Scheduled Meds:  Chlorhexidine Gluconate Cloth  6 each Topical Daily   fluticasone furoate-vilanterol  1 puff Inhalation Daily   And   umeclidinium bromide  1 puff Inhalation Daily   heparin  5,000 Units Subcutaneous Q8H   hydrocortisone sod succinate (SOLU-CORTEF) inj  100 mg Intravenous Q8H   lidocaine  1  application Other Once   nicotine  21 mg Transdermal Daily   phosphorus  500 mg Oral BID   simvastatin  20 mg Oral q1800   triamcinolone cream  1 application Topical TID   warfarin  10 mg Oral ONCE-1600   Warfarin - Pharmacist Dosing Inpatient   Does not apply q1600   Continuous Infusions:  sodium chloride 20 mL/hr at 11/26/20 1130   amiodarone Stopped (11/26/20 1056)   ceFEPime (MAXIPIME) IV     phenylephrine (NEO-SYNEPHRINE) Adult infusion 55 mcg/min (11/26/20 1130)   vancomycin Stopped (11/26/20 0714)    Procedures/Studies: DG Abd 1 View  Result Date: 11/22/2020 CLINICAL DATA:  Small-bowel obstruction. EXAM: ABDOMEN - 1 VIEW COMPARISON:  11/21/2020 and CT on 11/20/2020 FINDINGS: Nasogastric tube extends into the proximal stomach and terminates in the region of the fundus. Multiple dilated loops of small bowel again noted with similar caliber compared to the prior CT. Maximal small bowel caliber is approximately 5 cm. No evidence of free intraperitoneal air or pneumatosis. There is some sparse air scattered in the colon and findings are consistent with partial small bowel obstruction. IMPRESSION: 1. Stable nasogastric tube positioning with the tip terminating in the region of the fundus. 2. Similar appearance dilated small bowel and sparse air in the colon consistent with partial small bowel obstruction. No evidence of free air. Electronically Signed   By: Aletta Edouard M.D.   On: 11/22/2020 08:56   DG Abd 1 View  Result Date: 11/21/2020 CLINICAL DATA:  NG tube placement EXAM: ABDOMEN - 1 VIEW COMPARISON:  11/21/2020 FINDINGS: NG tube in the upper abdomen, NG tube tip in the fundus of the stomach. Dilated bowel loops again noted as seen on CT compatible with small bowel obstruction. IMPRESSION: NG tube tip in the fundus of the stomach. Electronically Signed   By: Rolm Baptise M.D.   On: 11/21/2020 19:09   DG Abd 1 View  Result Date: 11/21/2020 CLINICAL DATA:  NG tube placement EXAM:  ABDOMEN - 1 VIEW COMPARISON:  11/21/2020 FINDINGS: NG tube tip is in the upper thoracic esophagus near the thoracic inlet. Cardiomegaly. Bibasilar atelectasis. Small left effusion. IMPRESSION: NG tube tip in the upper thoracic  esophagus near the thoracic inlet. Electronically Signed   By: Rolm Baptise M.D.   On: 11/21/2020 18:05   DG Abd 1 View  Result Date: 11/21/2020 CLINICAL DATA:  70 year old male with small bowel obstruction. EXAM: ABDOMEN - 1 VIEW COMPARISON:  CT Abdomen and Pelvis 11/20/2020. FINDINGS: Portable AP supine view at 0443 hours. Small-bowel obstruction pattern persists, and has not significantly changed. Some of the gas-filled small bowel loops may be slightly less dilated since yesterday. Excreted IV contrast in the urinary bladder. No enteric tube identified. Stable elevated left hemidiaphragm. No acute osseous abnormality identified. IMPRESSION: Small-bowel obstruction gas pattern not significantly changed since yesterday. Electronically Signed   By: Genevie Ann M.D.   On: 11/21/2020 06:09   CT ABDOMEN PELVIS W CONTRAST  Result Date: 11/20/2020 CLINICAL DATA:  Acute abdominal pain.  Vomiting. EXAM: CT ABDOMEN AND PELVIS WITH CONTRAST TECHNIQUE: Multidetector CT imaging of the abdomen and pelvis was performed using the standard protocol following bolus administration of intravenous contrast. CONTRAST:  111m OMNIPAQUE IOHEXOL 350 MG/ML SOLN COMPARISON:  CT abdomen 04/22/2018 FINDINGS: Lower chest: Atelectasis LEFT lung base. Elevated LEFT hemidiaphragm diaphragm Hepatobiliary: Simple fluid attenuation lesion in the LEFT hepatic lobe not changed from prior. Probable collection of small benign cysts. No biliary duct dilatation. Gallbladder normal. Suggestion of sludge within the gallbladder. Pancreas: Pancreas is normal. No ductal dilatation. No pancreatic inflammation. Spleen: Normal spleen Adrenals/urinary tract: Adrenal glands and kidneys are normal. The ureters and bladder normal.  Stomach/Bowel: Stomach is normal.  Duodenum is normal. There is laxity of the abdominal wall such that the entirety of the abdomen is not imaged. There are dilated loops of small bowel within the mid jejunum and ileum measuring up to 5.4 cm (coronal image 23/series 5) the small bowel is collapsed leading up to the terminal ileum. Appendix normal. No transitional point is identified No pneumatosis or portal venous gas. Small volume stool in the colon. Gas throughout colon. Gas in the rectum. Vascular/Lymphatic: Abdominal aorta is normal caliber with atherosclerotic calcification. There is no retroperitoneal or periportal lymphadenopathy. No pelvic lymphadenopathy. Reproductive: Prostate un remark Other: No intraperitoneal free air.  No intraperitoneal free fluid Musculoskeletal: No aggressive osseous lesion. IMPRESSION: 1. Small bowel obstruction pattern with multiple dilated loops of mid small bowel and collapsed distal ileum. No discrete transition point identified. 2. No pneumatosis or portal venous gas identified.  No perforation 3. Stool and gas in the colon suggests early obstruction. Electronically Signed   By: SSuzy BouchardM.D.   On: 11/20/2020 17:32   DG Abd Portable 1V-Small Bowel Obstruction Protocol-initial, 8 hr delay  Result Date: 11/22/2020 CLINICAL DATA:  Small bowel obstruction EXAM: PORTABLE ABDOMEN - 1 VIEW COMPARISON:  11/22/2020 FINDINGS: Continued gaseous distention of bowel. This appears to involve both large and small bowel raising the possibility of ileus. No free air organomegaly. IMPRESSION: Continued gaseous distention of bowel, both large and small bowel currently noted. Question ileus versus bowel obstruction. Electronically Signed   By: KRolm BaptiseM.D.   On: 11/22/2020 18:25   [place]KG SITE RITE  Result Date: 11/26/2020 If Site Rite image not attached, placement could not be confirmed due to current cardiac rhythm.   DOrson Eva DO  [facility]  If 7PM-7AM, please  contact night-coverage [email or web address] Password [email or web address] 11/26/2020, 12:19 PM   LOS: 4 days

## 2020-11-26 NOTE — Progress Notes (Signed)
PICC order received. Spoke with primary RN re: PICC placement, per RN call back this afternoon as the parient may not need the PICC.

## 2020-11-26 NOTE — Progress Notes (Signed)
Peripherally Inserted Central Catheter Placement  The IV Nurse has discussed with the patient and/or persons authorized to consent for the patient, the purpose of this procedure and the potential benefits and risks involved with this procedure.  The benefits include less needle sticks, lab draws from the catheter, and the patient may be discharged home with the catheter. Risks include, but not limited to, infection, bleeding, blood clot (thrombus formation), and puncture of an artery; nerve damage and irregular heartbeat and possibility to perform a PICC exchange if needed/ordered by physician.  Alternatives to this procedure were also discussed.  Bard Power PICC patient education guide, fact sheet on infection prevention and patient information card has been provided to patient /or left at bedside.    PICC Placement Documentation  PICC Double Lumen 11/26/20 PICC Right Brachial 44 cm 0 cm (Active)  Indication for Insertion or Continuance of Line Vasoactive infusions 11/26/20 1322  Exposed Catheter (cm) 0 cm 11/26/20 1322  Site Assessment Clean;Dry;Intact 11/26/20 1322  Lumen #1 Status Flushed;Blood return noted 11/26/20 1322  Lumen #2 Status Flushed;Blood return noted 11/26/20 1322  Dressing Type Transparent 11/26/20 1322  Dressing Status Clean;Dry;Intact 11/26/20 1322  Antimicrobial disc in place? Yes 11/26/20 1322  Dressing Intervention New dressing;Other (Comment) 11/26/20 1322  Dressing Change Due 12/03/20 11/26/20 1322       Christella Noa Albarece 11/26/2020, 1:23 PM

## 2020-11-26 NOTE — Progress Notes (Signed)
*  PRELIMINARY RESULTS* Echocardiogram 2D Echocardiogram has been performed.  Jesus Rodriguez 11/26/2020, 3:58 PM

## 2020-11-26 NOTE — Telephone Encounter (Signed)
New Message    Patient daughter called his condition is getting worse , he is currently admitted to [facility] ICU and they are not doing anything for him, he has not been taking his AFIB medication , they said his heart rate is high and they are waiting for it to come down, but how are they getting his HR down if they wont give him his meds?  They would like someone from out to come see him today.

## 2020-11-26 NOTE — Progress Notes (Signed)
ANTICOAGULATION CONSULT NOTE -  Pharmacy Consult for warfarin Indication: atrial fibrillation  Allergies  Allergen Reactions   Celebrex [Celecoxib]    Methotrexate Derivatives Other (See Comments)    Arrhythmia    Sulfa Antibiotics     Patient Measurements: Height: '6\' 1"'$  (185.4 cm) Weight: (!) 169.4 kg (373 lb 7.4 oz) IBW/kg (Calculated) : 79.9  Vital Signs: Temp: 98 F (36.7 C) (08/15 0700) Temp Source: Oral (08/15 0700) BP: 92/48 (08/15 0630) Pulse Rate: 95 (08/15 0630)  Labs: Recent Labs    11/24/20 0610 11/25/20 0557 11/25/20 0601 11/25/20 0822 11/26/20 0422  HGB 13.0  --  11.8* 12.0* 12.5*  HCT 41.7  --  36.6* 37.6* 39.3  PLT 181  --  180 175 253  LABPROT 17.4* 19.7*  --   --  22.0*  INR 1.4* 1.7*  --   --  1.9*  CREATININE 1.32* 1.60*  --   --  1.09     Estimated Creatinine Clearance: 103.2 mL/min (by C-G formula based on SCr of 1.09 mg/dL).   Medical History: Past Medical History:  Diagnosis Date   Anxiety 02/13/2016   Atrial flutter ([place]) 02/13/2016   s/p CTI ablation at [facility]   DDD (degenerative disc disease), lumbar 02/13/2016   Essential hypertension 02/13/2016   Insomnia 02/13/2016   Mixed hyperlipidemia 02/13/2016   Osteoarthritis of both hands    Persistent atrial fibrillation (HCC)    RA (rheumatoid arthritis) ([place]) 02/13/2016   Sero Negative   Scrotal cancer ([place]) 02/13/2016   Sleep apnea 02/13/2016    Medications:  Medications Prior to Admission  Medication Sig Dispense Refill Last Dose   acetaminophen (TYLENOL) 500 MG tablet Take 500 mg by mouth every 6 (six) hours as needed.      albuterol (VENTOLIN HFA) 108 (90 Base) MCG/ACT inhaler Inhale 1 puff into the lungs every 6 (six) hours as needed.      benazepril (LOTENSIN) 10 MG tablet Take 1 tablet (10 mg total) by mouth daily. 30 tablet 6 Past Week   doxycycline (VIBRAMYCIN) 100 MG capsule Take 100 mg by mouth daily.   Past Week   ketoconazole (NIZORAL) 2 % cream Apply 1 application  topically 2 (two) times daily as needed for irritation.   Past Week   Metoprolol Tartrate 75 MG TABS TAKE 1 TABLET TWICE A DAY 180 tablet 2 Past Week   simvastatin (ZOCOR) 20 MG tablet TAKE 1 TABLET BY MOUTH DAILY AT 6 PM. 90 tablet 1 Past Week   tamsulosin (FLOMAX) 0.4 MG CAPS capsule Take by mouth daily.   Past Month   torsemide (DEMADEX) 20 MG tablet Take 3 tablets (60 mg total) by mouth 2 (two) times daily. 180 tablet 6 Past Week   TRELEGY ELLIPTA 100-62.5-25 MCG/INH AEPB Inhale 1 puff into the lungs daily.   Past Week   triamcinolone (KENALOG) 0.1 % Apply 1 application topically 3 (three) times daily.   Past Week   warfarin (COUMADIN) 10 MG tablet TAKE 1/2 TO 1 TABLET DAILY AS DIRECTED BY COUMADIN CLINIC. (Patient taking differently: Take 5-10 mg by mouth as directed. Take 1/2 to 1 tablet daily as directed by coumadin clinic.) 90 tablet 1 Past Week at unknown    Assessment: Pharmacy consulted to dose warfarin in patient with atrial fibrillation.  INR currently 1.5.  Home dose listed as 5 mg every Sun/Thurs and 10 mg ROW per anticoag clinic.  INR 1.9, slightly subtherapeutic but trending up.  Goal of Therapy:  INR 2-3 Monitor  platelets by anticoagulation protocol: Yes   Plan:  Warfarin 10 mg x 1 dose today  Monitor daily INR and s/s of bleeding.  Isac Sarna, BS Vena [place], BCPS Clinical Pharmacist Pager [phone] 11/26/2020,8:44 AM

## 2020-11-27 LAB — PROTIME-INR
INR: 2.8 — ABNORMAL HIGH (ref 0.8–1.2)
Prothrombin Time: 29.1 seconds — ABNORMAL HIGH (ref 11.4–15.2)

## 2020-11-27 MED ORDER — METOPROLOL TARTRATE 50 MG PO TABS
50.0000 mg | ORAL_TABLET | Freq: Two times a day (BID) | ORAL | Status: DC
  Administered 2020-11-27: 50 mg via ORAL
  Filled 2020-11-27: qty 1

## 2020-11-27 NOTE — Care Management Important Message (Signed)
Important Message  Patient Details  Name: Jesus Rodriguez MRN: [id number] Date of Birth: [birth year]   Medicare Important Message Given:  Yes     Tommy Medal 11/27/2020, 1:52 PM

## 2020-11-27 NOTE — Progress Notes (Signed)
ANTICOAGULATION CONSULT NOTE -  Pharmacy Consult for warfarin Indication: atrial fibrillation  Allergies  Allergen Reactions   Celebrex [Celecoxib]    Methotrexate Derivatives Other (See Comments)    Arrhythmia    Sulfa Antibiotics     Patient Measurements: Height: '6\' 1"'$  (185.4 cm) Weight: (!) 174.8 kg (385 lb 5.8 oz) IBW/kg (Calculated) : 79.9  Vital Signs: Temp: 97.6 F (36.4 C) (08/16 0457) Temp Source: Oral (08/16 0457) BP: 105/59 (08/16 0700) Pulse Rate: 90 (08/16 0700)  Labs: Recent Labs    11/25/20 0557 11/25/20 0601 11/25/20 0601 11/25/20 0822 11/26/20 0422 11/27/20 0522  HGB  --  11.8*   < > 12.0* 12.5*  --   HCT  --  36.6*  --  37.6* 39.3  --   PLT  --  180  --  175 253  --   LABPROT 19.7*  --   --   --  22.0* 29.1*  INR 1.7*  --   --   --  1.9* 2.8*  CREATININE 1.60*  --   --   --  1.09  --    < > = values in this interval not displayed.     Estimated Creatinine Clearance: 105.2 mL/min (by C-G formula based on SCr of 1.09 mg/dL).   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      albuterol (VENTOLIN HFA) 108 (90 Base) MCG/ACT inhaler Inhale 1 puff into the lungs every 6 (six) hours as needed.      benazepril (LOTENSIN) 10 MG tablet Take 1 tablet (10 mg total) by mouth daily. 30 tablet 6 Past Week   doxycycline  (VIBRAMYCIN) 100 MG capsule Take 100 mg by mouth daily.   Past Week   ketoconazole (NIZORAL) 2 % cream Apply 1 application topically 2 (two) times daily as needed for irritation.   Past Week   Metoprolol Tartrate 75 MG TABS TAKE 1 TABLET TWICE A DAY 180 tablet 2 Past Week   simvastatin (ZOCOR) 20 MG tablet TAKE 1 TABLET BY MOUTH DAILY AT 6 PM. 90 tablet 1 Past Week   tamsulosin (FLOMAX) 0.4 MG CAPS capsule Take by mouth daily.   Past Month   torsemide (DEMADEX) 20 MG tablet Take 3 tablets (60 mg total) by mouth 2 (two) times daily. 180 tablet 6 Past Week   TRELEGY ELLIPTA 100-62.5-25 MCG/INH AEPB Inhale 1 puff into the lungs daily.   Past Week   triamcinolone (KENALOG) 0.1 % Apply 1 application topically 3 (three) times daily.   Past Week   warfarin (COUMADIN) 10 MG tablet TAKE 1/2 TO 1 TABLET DAILY AS DIRECTED BY COUMADIN CLINIC. (Patient taking differently: Take 5-10 mg by mouth as directed. Take 1/2 to 1 tablet daily as directed by coumadin clinic.) 90 tablet 1 Past Week at unknown    Assessment:  Pharmacy consulted to dose warfarin in patient with atrial fibrillation.  Home dose listed as 5 mg every Sun/Thurs and 10 mg ROW per anticoag clinic.  INR 1.9> 2.8,   On amiodarone 8/14 > 8/15  Goal of Therapy:  INR 2-3 Monitor platelets by anticoagulation protocol: Yes   Plan:  Hold warfarin x 1 dose due to large increase in INR Monitor daily INR and s/s of bleeding.  Margot Ables, PharmD Clinical Pharmacist 11/27/2020 8:29 AM

## 2020-11-27 NOTE — TOC Transition Note (Signed)
Transition of Care [facility]) - CM/SW Discharge Note   Patient Details  Name: Jesus Rodriguez MRN: [id number] Date of Birth: March 16, 1951  Transition of Care [facility]) CM/SW Contact:  Boneta Lucks, RN Phone Number: 11/27/2020, 1:14 PM   Clinical Narrative:   Patient is medically ready for discharge. He does not have a PCP, TOC left a message with [facility].  Vaughan Basta with [facility] accepted the referral for home health RN/PT.  TOC sent a message to Dr Algis Liming to see if he will sign orders until patient can get established with PCP.   Final next level of care: [place] Barriers to Discharge: Barriers Resolved  Patient Goals and CMS Choice Patient states their goals for this hospitalization and ongoing recovery are:: to go home. CMS Medicare.gov Compare Post Acute Care list provided to:: Patient    Discharge Placement    Patient and family notified of of transfer: 11/27/20  Discharge Plan and Services    HH Arranged: RN, PT [facility] Agency: [place] ([place]) Date [place]: 11/27/20 Time [place]: [place] Representative spoke with at [place]: Romualdo Bolk  Readmission Risk Interventions Readmission Risk Prevention Plan 11/27/2020 11/30/2019  Transportation Screening Complete Complete  PCP or Specialist Appt within 5-7 Days Not Complete -  Home Care Screening Complete Complete  Medication Review (RN CM) Complete Complete  Some recent data might be hidden

## 2020-11-27 NOTE — Progress Notes (Signed)
Progress Note  Patient Name: Jesus Rodriguez Date of Encounter: 11/27/2020  [facility] Cardiologist: Carlyle Dolly, MD    Subjective   Feeling better. Wants to go home.  Inpatient Medications    Scheduled Meds:  Chlorhexidine Gluconate Cloth  6 each Topical Daily   fluticasone furoate-vilanterol  1 puff Inhalation Daily   And   umeclidinium bromide  1 puff Inhalation Daily   hydrocortisone sod succinate (SOLU-CORTEF) inj  100 mg Intravenous Q8H   lidocaine  1 application Other Once   nicotine  21 mg Transdermal Daily   phosphorus  500 mg Oral BID   simvastatin  20 mg Oral q1800   sodium chloride flush  10-40 mL Intracatheter Q12H   triamcinolone cream  1 application Topical TID   Warfarin - Pharmacist Dosing Inpatient   Does not apply q1600   Continuous Infusions:  sodium chloride 10 mL/hr at 11/27/20 0212   ceFEPime (MAXIPIME) IV 2 g (11/27/20 0509)   phenylephrine (NEO-SYNEPHRINE) Adult infusion Stopped (11/26/20 1727)   vancomycin 1,500 mg (11/27/20 0607)   PRN Meds: acetaminophen **OR** acetaminophen, albuterol, HYDROmorphone (DILAUDID) injection, ondansetron **OR** ondansetron (ZOFRAN) IV, phenol, sodium chloride flush   Vital Signs    Vitals:   11/27/20 0530 11/27/20 0600 11/27/20 0700 11/27/20 0743  BP: 117/66 111/63 (!) 105/59   Pulse: 93 90 90   Resp: (!) 24 (!) 26 20   Temp:      TempSrc:      SpO2: 94% 93% 93% 94%  Weight:      Height:        Intake/Output Summary (Last 24 hours) at 11/27/2020 0805 Last data filed at 11/27/2020 0457 Gross per 24 hour  Intake 2277.88 ml  Output 3600 ml  Net -1322.12 ml   Last 3 Weights 11/27/2020 11/25/2020 11/20/2020  Weight (lbs) 385 lb 5.8 oz 373 lb 7.4 oz 370 lb 6 oz  Weight (kg) 174.8 kg 169.4 kg 168 kg      Telemetry    Afib with rates 70-110/m occasional PVC, couplet - Personally Reviewed  ECG       Physical Exam    GEN: Obese, No acute distress.   Neck: No JVD Cardiac: irreg, no murmurs,  rubs, or gallops.  Respiratory: decreased breath sounds but Clear to auscultation bilaterally. GI: Soft, nontender, non-distended  MS: brawny changes of chronic edema; No deformity. Neuro:  Nonfocal  Psych: Normal affect   Labs    High Sensitivity Troponin:  No results for input(s): TROPONINIHS in the last 720 hours.    Chemistry Recent Labs  Lab 11/21/20 0431 11/22/20 0500 11/23/20 0459 11/24/20 0610 11/25/20 0557 11/26/20 0422  NA 136 137   < > 134* 130* 134*  K 3.1* 3.4*   < > 4.2 3.7 3.9  CL 95* 97*   < > 95* 98 97*  CO2 32 32   < > '30 29 28  '$ GLUCOSE 92 91   < > 124* 100* 150*  BUN 24* 20   < > 13 27* 29*  CREATININE 0.87 0.84   < > 1.32* 1.60* 1.09  CALCIUM 8.3* 8.4*   < > 8.4* 7.2* 7.8*  PROT 5.9* 6.2*  --   --   --  6.1*  ALBUMIN 2.8* 2.9*  --   --   --  2.6*  AST 23 20  --   --   --  17  ALT 17 18  --   --   --  13  ALKPHOS 70 73  --   --   --  59  BILITOT 1.6* 1.6*  --   --   --  1.1  GFRNONAA >60 >60   < > 58* 46* >60  ANIONGAP 9 8   < > 9 3* 9   < > = values in this interval not displayed.     Hematology Recent Labs  Lab 11/25/20 0601 11/25/20 0822 11/26/20 0422  WBC 7.8 7.2 7.1  RBC 3.82* 3.93* 4.09*  HGB 11.8* 12.0* 12.5*  HCT 36.6* 37.6* 39.3  MCV 95.8 95.7 96.1  MCH 30.9 30.5 30.6  MCHC 32.2 31.9 31.8  RDW 15.2 15.0 15.0  PLT 180 175 253    BNP Recent Labs  Lab 11/24/20 0610  BNP 164.0*     DDimer No results for input(s): DDIMER in the last 168 hours.   Radiology    DG Chest Port 1 View  Result Date: 11/26/2020 CLINICAL DATA:  Status post PICC placement. EXAM: PORTABLE CHEST 1 VIEW COMPARISON:  April 22, 2020. FINDINGS: Stable cardiomegaly with central pulmonary vascular congestion. No pneumothorax is noted. Right-sided PICC line is noted with distal tip in expected position of the SVC. Elevated left hemidiaphragm is noted. Bilateral pleural effusions are noted with associated atelectasis. Bony thorax is unremarkable. IMPRESSION:  Interval placement of right-sided PICC line with distal tip in expected position of the SVC. Stable cardiomegaly with central pulmonary vascular congestion. Bilateral pleural effusions are noted with associated subsegmental atelectasis. Electronically Signed   By: Marijo Conception M.D.   On: 11/26/2020 14:16   ECHOCARDIOGRAM COMPLETE  Result Date: 11/26/2020    ECHOCARDIOGRAM REPORT   Patient Name:   Jesus Rodriguez Date of Exam: 11/26/2020 Medical Rec #:  [id number]      Height:       73.0 in Accession #:    [id number]     Weight:       373.5 lb Date of Birth:  June 17, 1950      BSA:          2.806 m Patient Age:    70 years       BP:           137/43 mmHg Patient Gender: M              HR:           106 bpm. Exam Location:  [facility] Procedure: 2D Echo, Cardiac Doppler and Color Doppler Indications:    Atrial Fibrillation  History:        Patient has prior history of Echocardiogram examinations, most                 recent 11/04/2018. CHF, COPD, Arrythmias:Atrial Fibrillation;                 Risk Factors:Hypertension and Current Smoker.  Sonographer:    Wenda Low Referring Phys: [phone] DAVID TAT IMPRESSIONS  1. Left ventricular ejection fraction, by estimation, is 55 to 60%. The left ventricle has normal function. Left ventricular endocardial border not optimally defined to evaluate regional wall motion. There is mild left ventricular hypertrophy. Left ventricular diastolic parameters are indeterminate.  2. Right ventricular systolic function is normal. The right ventricular size is normal. There is moderately elevated pulmonary artery systolic pressure.  3. Left atrial size was upper normal.  4. Right atrial size was upper normal.  5. The mitral valve is grossly normal. Trivial mitral valve regurgitation.  6. The aortic valve  is tricuspid. Aortic valve regurgitation is not visualized. Aortic valve mean gradient measures 5.0 mmHg.  7. Aortic dilatation noted. There is mild dilatation of the aortic root,  measuring 40 mm. Comparison(s): Prior images reviewed side by side. Stable LVEF and aortic root size. PASP has increased. FINDINGS  Left Ventricle: Left ventricular ejection fraction, by estimation, is 55 to 60%. The left ventricle has normal function. Left ventricular endocardial border not optimally defined to evaluate regional wall motion. The left ventricular internal cavity size was normal in size. There is mild left ventricular hypertrophy. Left ventricular diastolic parameters are indeterminate. Right Ventricle: The right ventricular size is normal. No increase in right ventricular wall thickness. Right ventricular systolic function is normal. There is moderately elevated pulmonary artery systolic pressure. The tricuspid regurgitant velocity is 3.43 m/s, and with an assumed right atrial pressure of 8 mmHg, the estimated right ventricular systolic pressure is 123456 mmHg. Left Atrium: Left atrial size was upper normal. Right Atrium: Right atrial size was upper normal. Pericardium: There is no evidence of pericardial effusion. Presence of pericardial fat pad. Mitral Valve: The mitral valve is grossly normal. Mild mitral annular calcification. Trivial mitral valve regurgitation. MV peak gradient, 6.1 mmHg. The mean mitral valve gradient is 2.0 mmHg. Tricuspid Valve: The tricuspid valve is grossly normal. Tricuspid valve regurgitation is mild. Aortic Valve: The aortic valve is tricuspid. There is mild aortic valve annular calcification. Aortic valve regurgitation is not visualized. Aortic valve mean gradient measures 5.0 mmHg. Aortic valve peak gradient measures 8.9 mmHg. Aortic valve area, by  VTI measures 1.74 cm. Pulmonic Valve: The pulmonic valve was grossly normal. Pulmonic valve regurgitation is trivial. Aorta: The aortic root is normal in size and structure and aortic dilatation noted. There is mild dilatation of the aortic root, measuring 40 mm. IAS/Shunts: No atrial level shunt detected by color flow  Doppler.  LEFT VENTRICLE PLAX 2D LVIDd:         4.92 cm  Diastology LVIDs:         2.88 cm  LV e' medial:    14.70 cm/s LV PW:         1.23 cm  LV E/e' medial:  6.8 LV IVS:        1.25 cm  LV e' lateral:   14.00 cm/s LVOT diam:     2.00 cm  LV E/e' lateral: 7.1 LV SV:         53 LV SV Index:   19 LVOT Area:     3.14 cm  RIGHT VENTRICLE RV Basal diam:  4.35 cm RV Mid diam:    3.70 cm RV S prime:     19.40 cm/s TAPSE (M-mode): 3.4 cm LEFT ATRIUM             Index       RIGHT ATRIUM           Index LA diam:        4.20 cm 1.50 cm/m  RA Area:     29.40 cm LA Vol (A2C):   53.8 ml 19.18 ml/m RA Volume:   99.20 ml  35.36 ml/m LA Vol (A4C):   96.5 ml 34.39 ml/m LA Biplane Vol: 77.4 ml 27.59 ml/m  AORTIC VALVE AV Area (Vmax):    2.02 cm AV Area (Vmean):   1.73 cm AV Area (VTI):     1.74 cm AV Vmax:           149.00 cm/s AV Vmean:  101.000 cm/s AV VTI:            0.305 m AV Peak Grad:      8.9 mmHg AV Mean Grad:      5.0 mmHg LVOT Vmax:         96.00 cm/s LVOT Vmean:        55.500 cm/s LVOT VTI:          0.169 m LVOT/AV VTI ratio: 0.55  AORTA Ao Root diam: 3.70 cm MITRAL VALVE               TRICUSPID VALVE MV Area (PHT): 3.97 cm    TR Peak grad:   47.1 mmHg MV Area VTI:   1.57 cm    TR Vmax:        343.00 cm/s MV Peak grad:  6.1 mmHg MV Mean grad:  2.0 mmHg    SHUNTS MV Vmax:       1.23 m/s    Systemic VTI:  0.17 m MV Vmean:      57.3 cm/s   Systemic Diam: 2.00 cm MV Decel Time: 191 msec MV E velocity: 99.80 cm/s Rozann Lesches MD Electronically signed by Rozann Lesches MD Signature Date/Time: 11/26/2020/5:14:59 PM    Final    [place] EKG SITE RITE  Result Date: 11/26/2020 If Site Rite image not attached, placement could not be confirmed due to current cardiac rhythm.   Cardiac Studies  Echo 11/26/20 IMPRESSIONS     1. Left ventricular ejection fraction, by estimation, is 55 to 60%. The  left ventricle has normal function. Left ventricular endocardial border  not optimally defined to evaluate  regional wall motion. There is mild left  ventricular hypertrophy. Left  ventricular diastolic parameters are indeterminate.   2. Right ventricular systolic function is normal. The right ventricular  size is normal. There is moderately elevated pulmonary artery systolic  pressure.   3. Left atrial size was upper normal.   4. Right atrial size was upper normal.   5. The mitral valve is grossly normal. Trivial mitral valve  regurgitation.   6. The aortic valve is tricuspid. Aortic valve regurgitation is not  visualized. Aortic valve mean gradient measures 5.0 mmHg.   7. Aortic dilatation noted. There is mild dilatation of the aortic root,  measuring 40 mm.   Comparison(s): Prior images reviewed side by side. Stable LVEF and aortic  root size. PASP has increased.   10/2018 echo  1. Images are limited.  2. The left ventricle has grossly normal systolic function, with an ejection fraction of 55-60%. The cavity size was normal. There is mildly increased left ventricular wall thickness. Left ventricular diastolic Doppler parameters are indeterminate.  3. The right ventricle has normal systolic function. The cavity was normal. There is no increase in right ventricular wall thickness. Right ventricular systolic pressure is normal with an estimated pressure of 12.5 mmHg.  4. The aortic valve is tricuspid. Mild calcification of the aortic valve. Mild aortic annular calcification noted.  5. The mitral valve is grossly normal. There is mild mitral annular calcification present.  6. The tricuspid valve is grossly normal.  7. The aorta is abnormal in size and structure.  8. There is mild dilatation of the aortic root.  9. The interatrial septum was not well visualized    Patient Profile     70 y.o. male  with a hx of Aflutter ablation 2017, Afib who is being seen 11/26/2020 for the evaluation of Afib with RVR in the  setting of ileus/NPO at the request of Dr. Carles Collet.   Assessment & Plan    Persistent  Afib with RVR when NPO for SBO/ileus. Followed by Dr. Rayann Heman and plan for rate control with metoprolol after failing dolfetilide and sotolal. On Coumadin. Rate better off Amio 30 mg/hr and phenylephrine. HR low 70- low 100's. Received Dig 0.5 mg IV yest. Restart metoprolol at 50 mg bid to see how he tolerates. Increase to 75 mg bid at discharge.   HTN now with hypotension in setting of rapid Afib and ileus. Previously on lotensin and demadex as OP-on hold. Now that he's eating  BP coming up- restart metoprolol-was on 75 mg bid PTA. restart at 50 mg bid and if tolerates back to 75 mg bid    Morbid Obesity   OSA on CPAP   COPD     For questions or updates, please contact [place] Please consult [email or web address] for contact info under        Signed, Ermalinda Barrios, PA-C  11/27/2020, 8:05 AM

## 2020-11-27 NOTE — Progress Notes (Signed)
Pt d/c to home, went over d/c instructions with pt, PICC line and IV removed, no distress noted. Family here to get pt.

## 2020-11-27 NOTE — Discharge Summary (Signed)
Physician Discharge Summary  Jesus Rodriguez Jesus Rodriguez DOB: [birth year] DOA: 11/20/2020  PCP: Patient, [name] (Inactive)  Admit date: 11/20/2020 Discharge date: 11/27/2020  Admitted From: home Disposition:  Home  Recommendations for Outpatient Follow-up:  Follow up with PCP in 1-2 weeks Please obtain BMP/CBC in one week   Home Health:HHPT Equipment/Devices:4L [place]  Discharge Condition: Stable CODE STATUS: FULL Diet recommendation: Heart Healthy / Carb Modified    Brief/Interim Summary: 70 year old male with a history of hypertension, hyperlipidemia, COPD, diastolic CHF, atrial fibrillation, rheumatoid arthritis, depression, scrotal carcinoma presenting with 4-day history of abdominal pain with nausea and vomiting.  CT in the ED showed small bowel obstruction pattern with multiple dilated loops of small bowel and collapsed distal ileum.  The patient was placed on IV fluids and bowel rest.  General surgery was consulted to assist with management.  Since his hospitalization, the patient has developed some atrial fibrillation with RVR.  His bowel function gradually improved.  He began having bowel movements.  His diet was advanced which she has been tolerating. On 11/24/20 evening, patient developed Afib with RVR with hypotension refractory to IVF bolus.  He was transferred to SDU.  He was started on neosynphrine and amiodarone IV initially.  Cardiology was consulted.  He was given digoxin and amiodarone was weaned off .  He was started back on his metoprolol and tolerating well.   Echo showed EF 55-60% although it was difficult to evaluate for WMA.  There was moderately elevated PASP with normal RV function.    Discharge Diagnoses:  Small bowel obstruction -Appreciate general surgery follow-up -Patient initially required NG decompression -Patient had 3 bowel movements in 24 hours 8/13 -Diet advancement per general surgery -clears>>full liquids>>heart healthy diet on 8/14 -Patient was  appropriately fluid resuscitated -overall improved -now tolerating diet   Atrial fibrillation with RVR, persistent -Patient was n.p.o. limiting intake of his p.o. medications and physiologic stress also contributed -Restart metoprolol tartrate at lower dose initially  -Restart warfarin - Previously followed by [facility].  Failed sotalol, started on dofetilide, followed by Dr. Rayann Heman.  Previous ablation 2017 -No longer on dofetilide -11/24/20 evening--developed RVR with hypotension refractory to IVF boluses -started amiodarone 8/14 due to soft BP and elevated creatinine to help with rate control -8/15--appreciate cardiology consult>>gave digoxin, wean off amio, wean off neosynephrine -PICC line placement for neosynphrine 8/15 -weaned off neo after about 12 hours -metoprolol restarted   COPD/chronic respiratory failure with hypoxia -Patient is on 4 L nasal cannula at home -Currently with oxygen saturation 92% on 4 L -Continue bronchodilators   Hypotension -started IV solucortef initially -lactic acid 0.8 -PCT 0.41 -blood cultures x 2 sets--neg -started empiric IV vanco and cefepime -ultimately this was felt to due to fluid shifts and development of afib with RVR -required neosynphrine about 12 hours -ultimately BP improved with IVF and control of Afib -steroids and antibiotics were discontinued    AKI -due to volume depletion, possible component of ATN from soft BPs -baseline creatinine 0.8-0.9 -serum creatinine peaking 1.60 -hold torsemide temporatrily>>restart after d/c -serum creatinine 1.09 at time of d/c   Essential hypertension -Holding benazepril secondary to soft blood pressure -holding metoprolol tartrate due to hypotension -restarted metoprolol after bp improved   Hyperlipidemia -Continue Zocor   Chronic diastolic CHF -123456 echo EF 55 to 60%, indeterminate diastolic function, normal RV -Normally on torsemide 60 mg twice daily which will be restarted when  hemodynamically stable   OSA -Patient is normally on CPAP at home with  which she is compliant   Tobacco abuse -Tobacco cessation discussed   Hypophosphatemia -replete   Morbid obesity -BMI 48.86 -Lifestyle modification    Discharge Instructions   Allergies as of 11/27/2020       Reactions   Celebrex [celecoxib]    Methotrexate Derivatives Other (See Comments)   Arrhythmia    Sulfa Antibiotics         Medication List     STOP taking these medications    doxycycline 100 MG capsule Commonly known as: VIBRAMYCIN       TAKE these medications    acetaminophen 500 MG tablet Commonly known as: TYLENOL Take 500 mg by mouth every 6 (six) hours as needed.   albuterol 108 (90 Base) MCG/ACT inhaler Commonly known as: VENTOLIN HFA Inhale 1 puff into the lungs every 6 (six) hours as needed.   benazepril 10 MG tablet Commonly known as: LOTENSIN Take 1 tablet (10 mg total) by mouth daily.   ketoconazole 2 % cream Commonly known as: NIZORAL Apply 1 application topically 2 (two) times daily as needed for irritation.   Metoprolol Tartrate 75 MG Tabs TAKE 1 TABLET TWICE A DAY   simvastatin 20 MG tablet Commonly known as: ZOCOR TAKE 1 TABLET BY MOUTH DAILY AT 6 PM.   tamsulosin 0.4 MG Caps capsule Commonly known as: FLOMAX Take by mouth daily.   torsemide 20 MG tablet Commonly known as: DEMADEX Take 3 tablets (60 mg total) by mouth 2 (two) times daily.   Trelegy Ellipta 100-62.5-25 MCG/INH Aepb Generic drug: Fluticasone-Umeclidin-Vilant Inhale 1 puff into the lungs daily.   triamcinolone cream 0.1 % Commonly known as: KENALOG Apply 1 application topically 3 (three) times daily.   warfarin 10 MG tablet Commonly known as: COUMADIN Take as directed. If you are unsure how to take this medication, talk to your nurse or doctor. Original instructions: TAKE 1/2 TO 1 TABLET DAILY AS DIRECTED BY COUMADIN CLINIC. What changed: See the new instructions.         Allergies  Allergen Reactions   Celebrex [Celecoxib]    Methotrexate Derivatives Other (See Comments)    Arrhythmia    Sulfa Antibiotics     Consultations: General surgery cardiology   Procedures/Studies: DG Abd 1 View  Result Date: 11/22/2020 CLINICAL DATA:  Small-bowel obstruction. EXAM: ABDOMEN - 1 VIEW COMPARISON:  11/21/2020 and CT on 11/20/2020 FINDINGS: Nasogastric tube extends into the proximal stomach and terminates in the region of the fundus. Multiple dilated loops of small bowel again noted with similar caliber compared to the prior CT. Maximal small bowel caliber is approximately 5 cm. No evidence of free intraperitoneal air or pneumatosis. There is some sparse air scattered in the colon and findings are consistent with partial small bowel obstruction. IMPRESSION: 1. Stable nasogastric tube positioning with the tip terminating in the region of the fundus. 2. Similar appearance dilated small bowel and sparse air in the colon consistent with partial small bowel obstruction. No evidence of free air. Electronically Signed   By: Aletta Edouard M.D.   On: 11/22/2020 08:56   DG Abd 1 View  Result Date: 11/21/2020 CLINICAL DATA:  NG tube placement EXAM: ABDOMEN - 1 VIEW COMPARISON:  11/21/2020 FINDINGS: NG tube in the upper abdomen, NG tube tip in the fundus of the stomach. Dilated bowel loops again noted as seen on CT compatible with small bowel obstruction. IMPRESSION: NG tube tip in the fundus of the stomach. Electronically Signed   By: Rolm Baptise M.D.  On: 11/21/2020 19:09   DG Abd 1 View  Result Date: 11/21/2020 CLINICAL DATA:  NG tube placement EXAM: ABDOMEN - 1 VIEW COMPARISON:  11/21/2020 FINDINGS: NG tube tip is in the upper thoracic esophagus near the thoracic inlet. Cardiomegaly. Bibasilar atelectasis. Small left effusion. IMPRESSION: NG tube tip in the upper thoracic esophagus near the thoracic inlet. Electronically Signed   By: Rolm Baptise M.D.   On: 11/21/2020  18:05   DG Abd 1 View  Result Date: 11/21/2020 CLINICAL DATA:  70 year old male with small bowel obstruction. EXAM: ABDOMEN - 1 VIEW COMPARISON:  CT Abdomen and Pelvis 11/20/2020. FINDINGS: Portable AP supine view at 0443 hours. Small-bowel obstruction pattern persists, and has not significantly changed. Some of the gas-filled small bowel loops may be slightly less dilated since yesterday. Excreted IV contrast in the urinary bladder. No enteric tube identified. Stable elevated left hemidiaphragm. No acute osseous abnormality identified. IMPRESSION: Small-bowel obstruction gas pattern not significantly changed since yesterday. Electronically Signed   By: Genevie Ann M.D.   On: 11/21/2020 06:09   CT ABDOMEN PELVIS W CONTRAST  Result Date: 11/20/2020 CLINICAL DATA:  Acute abdominal pain.  Vomiting. EXAM: CT ABDOMEN AND PELVIS WITH CONTRAST TECHNIQUE: Multidetector CT imaging of the abdomen and pelvis was performed using the standard protocol following bolus administration of intravenous contrast. CONTRAST:  119m OMNIPAQUE IOHEXOL 350 MG/ML SOLN COMPARISON:  CT abdomen 04/22/2018 FINDINGS: Lower chest: Atelectasis LEFT lung base. Elevated LEFT hemidiaphragm diaphragm Hepatobiliary: Simple fluid attenuation lesion in the LEFT hepatic lobe not changed from prior. Probable collection of small benign cysts. No biliary duct dilatation. Gallbladder normal. Suggestion of sludge within the gallbladder. Pancreas: Pancreas is normal. No ductal dilatation. No pancreatic inflammation. Spleen: Normal spleen Adrenals/urinary tract: Adrenal glands and kidneys are normal. The ureters and bladder normal. Stomach/Bowel: Stomach is normal.  Duodenum is normal. There is laxity of the abdominal wall such that the entirety of the abdomen is not imaged. There are dilated loops of small bowel within the mid jejunum and ileum measuring up to 5.4 cm (coronal image 23/series 5) the small bowel is collapsed leading up to the terminal ileum.  Appendix normal. No transitional point is identified No pneumatosis or portal venous gas. Small volume stool in the colon. Gas throughout colon. Gas in the rectum. Vascular/Lymphatic: Abdominal aorta is normal caliber with atherosclerotic calcification. There is no retroperitoneal or periportal lymphadenopathy. No pelvic lymphadenopathy. Reproductive: Prostate un remark Other: No intraperitoneal free air.  No intraperitoneal free fluid Musculoskeletal: No aggressive osseous lesion. IMPRESSION: 1. Small bowel obstruction pattern with multiple dilated loops of mid small bowel and collapsed distal ileum. No discrete transition point identified. 2. No pneumatosis or portal venous gas identified.  No perforation 3. Stool and gas in the colon suggests early obstruction. Electronically Signed   By: SSuzy BouchardM.D.   On: 11/20/2020 17:32   DG Chest Port 1 View  Result Date: 11/26/2020 CLINICAL DATA:  Status post PICC placement. EXAM: PORTABLE CHEST 1 VIEW COMPARISON:  April 22, 2020. FINDINGS: Stable cardiomegaly with central pulmonary vascular congestion. No pneumothorax is noted. Right-sided PICC line is noted with distal tip in expected position of the SVC. Elevated left hemidiaphragm is noted. Bilateral pleural effusions are noted with associated atelectasis. Bony thorax is unremarkable. IMPRESSION: Interval placement of right-sided PICC line with distal tip in expected position of the SVC. Stable cardiomegaly with central pulmonary vascular congestion. Bilateral pleural effusions are noted with associated subsegmental atelectasis. Electronically Signed   By: JJeneen Rinks  Murlean Caller M.D.   On: 11/26/2020 14:16   DG Abd Portable 1V-Small Bowel Obstruction Protocol-initial, 8 hr delay  Result Date: 11/22/2020 CLINICAL DATA:  Small bowel obstruction EXAM: PORTABLE ABDOMEN - 1 VIEW COMPARISON:  11/22/2020 FINDINGS: Continued gaseous distention of bowel. This appears to involve both large and small bowel raising the  possibility of ileus. No free air organomegaly. IMPRESSION: Continued gaseous distention of bowel, both large and small bowel currently noted. Question ileus versus bowel obstruction. Electronically Signed   By: Rolm Baptise M.D.   On: 11/22/2020 18:25   ECHOCARDIOGRAM COMPLETE  Result Date: 11/26/2020    ECHOCARDIOGRAM REPORT   Patient Name:   Jesus Rodriguez Date of Exam: 11/26/2020 Medical Rec #:  [id number]      Height:       73.0 in Accession #:    [id number]     Weight:       373.5 lb Date of Birth:  February 22, 1951      BSA:          2.806 m Patient Age:    56 years       BP:           137/43 mmHg Patient Gender: M              HR:           106 bpm. Exam Location:  [facility] Procedure: 2D Echo, Cardiac Doppler and Color Doppler Indications:    Atrial Fibrillation  History:        Patient has prior history of Echocardiogram examinations, most                 recent 11/04/2018. CHF, COPD, Arrythmias:Atrial Fibrillation;                 Risk Factors:Hypertension and Current Smoker.  Sonographer:    Wenda Low Referring Phys: [phone] Xzaiver Vayda IMPRESSIONS  1. Left ventricular ejection fraction, by estimation, is 55 to 60%. The left ventricle has normal function. Left ventricular endocardial border not optimally defined to evaluate regional wall motion. There is mild left ventricular hypertrophy. Left ventricular diastolic parameters are indeterminate.  2. Right ventricular systolic function is normal. The right ventricular size is normal. There is moderately elevated pulmonary artery systolic pressure.  3. Left atrial size was upper normal.  4. Right atrial size was upper normal.  5. The mitral valve is grossly normal. Trivial mitral valve regurgitation.  6. The aortic valve is tricuspid. Aortic valve regurgitation is not visualized. Aortic valve mean gradient measures 5.0 mmHg.  7. Aortic dilatation noted. There is mild dilatation of the aortic root, measuring 40 mm. Comparison(s): Prior images reviewed side by  side. Stable LVEF and aortic root size. PASP has increased. FINDINGS  Left Ventricle: Left ventricular ejection fraction, by estimation, is 55 to 60%. The left ventricle has normal function. Left ventricular endocardial border not optimally defined to evaluate regional wall motion. The left ventricular internal cavity size was normal in size. There is mild left ventricular hypertrophy. Left ventricular diastolic parameters are indeterminate. Right Ventricle: The right ventricular size is normal. No increase in right ventricular wall thickness. Right ventricular systolic function is normal. There is moderately elevated pulmonary artery systolic pressure. The tricuspid regurgitant velocity is 3.43 m/s, and with an assumed right atrial pressure of 8 mmHg, the estimated right ventricular systolic pressure is 123456 mmHg. Left Atrium: Left atrial size was upper normal. Right Atrium: Right atrial size was upper normal. Pericardium: There  is no evidence of pericardial effusion. Presence of pericardial fat pad. Mitral Valve: The mitral valve is grossly normal. Mild mitral annular calcification. Trivial mitral valve regurgitation. MV peak gradient, 6.1 mmHg. The mean mitral valve gradient is 2.0 mmHg. Tricuspid Valve: The tricuspid valve is grossly normal. Tricuspid valve regurgitation is mild. Aortic Valve: The aortic valve is tricuspid. There is mild aortic valve annular calcification. Aortic valve regurgitation is not visualized. Aortic valve mean gradient measures 5.0 mmHg. Aortic valve peak gradient measures 8.9 mmHg. Aortic valve area, by  VTI measures 1.74 cm. Pulmonic Valve: The pulmonic valve was grossly normal. Pulmonic valve regurgitation is trivial. Aorta: The aortic root is normal in size and structure and aortic dilatation noted. There is mild dilatation of the aortic root, measuring 40 mm. IAS/Shunts: No atrial level shunt detected by color flow Doppler.  LEFT VENTRICLE PLAX 2D LVIDd:         4.92 cm   Diastology LVIDs:         2.88 cm  LV e' medial:    14.70 cm/s LV PW:         1.23 cm  LV E/e' medial:  6.8 LV IVS:        1.25 cm  LV e' lateral:   14.00 cm/s LVOT diam:     2.00 cm  LV E/e' lateral: 7.1 LV SV:         53 LV SV Index:   19 LVOT Area:     3.14 cm  RIGHT VENTRICLE RV Basal diam:  4.35 cm RV Mid diam:    3.70 cm RV S prime:     19.40 cm/s TAPSE (M-mode): 3.4 cm LEFT ATRIUM             Index       RIGHT ATRIUM           Index LA diam:        4.20 cm 1.50 cm/m  RA Area:     29.40 cm LA Vol (A2C):   53.8 ml 19.18 ml/m RA Volume:   99.20 ml  35.36 ml/m LA Vol (A4C):   96.5 ml 34.39 ml/m LA Biplane Vol: 77.4 ml 27.59 ml/m  AORTIC VALVE AV Area (Vmax):    2.02 cm AV Area (Vmean):   1.73 cm AV Area (VTI):     1.74 cm AV Vmax:           149.00 cm/s AV Vmean:          101.000 cm/s AV VTI:            0.305 m AV Peak Grad:      8.9 mmHg AV Mean Grad:      5.0 mmHg LVOT Vmax:         96.00 cm/s LVOT Vmean:        55.500 cm/s LVOT VTI:          0.169 m LVOT/AV VTI ratio: 0.55  AORTA Ao Root diam: 3.70 cm MITRAL VALVE               TRICUSPID VALVE MV Area (PHT): 3.97 cm    TR Peak grad:   47.1 mmHg MV Area VTI:   1.57 cm    TR Vmax:        343.00 cm/s MV Peak grad:  6.1 mmHg MV Mean grad:  2.0 mmHg    SHUNTS MV Vmax:       1.23 m/s    Systemic VTI:  0.17 m MV Vmean:      57.3 cm/s   Systemic Diam: 2.00 cm MV Decel Time: 191 msec MV E velocity: 99.80 cm/s Rozann Lesches MD Electronically signed by Rozann Lesches MD Signature Date/Time: 11/26/2020/5:14:59 PM    Final    [place] EKG SITE RITE  Result Date: 11/26/2020 If Site Rite image not attached, placement could not be confirmed due to current cardiac rhythm.       Discharge Exam: Vitals:   11/27/20 1000 11/27/20 1213  BP: 127/64   Pulse: 68   Resp: 20   Temp:  98.1 F (36.7 C)  SpO2: 96%    Vitals:   11/27/20 0800 11/27/20 0900 11/27/20 1000 11/27/20 1213  BP: 139/72 (!) 148/82 127/64   Pulse: 82 82 68   Resp: '20 20 20   '$ Temp:     98.1 F (36.7 C)  TempSrc:    Oral  SpO2: 91% 93% 96%   Weight:      Height:        General: Pt is alert, awake, not in acute distress Cardiovascular: IRRR, S1/S2 +, no rubs, no gallops Respiratory: bibasilar rales.  No wheeze Abdominal: Soft, NT, ND, bowel sounds + Extremities: trace LE edema, no cyanosis   The results of significant diagnostics from this hospitalization (including imaging, microbiology, ancillary and laboratory) are listed below for reference.    Significant Diagnostic Studies: DG Abd 1 View  Result Date: 11/22/2020 CLINICAL DATA:  Small-bowel obstruction. EXAM: ABDOMEN - 1 VIEW COMPARISON:  11/21/2020 and CT on 11/20/2020 FINDINGS: Nasogastric tube extends into the proximal stomach and terminates in the region of the fundus. Multiple dilated loops of small bowel again noted with similar caliber compared to the prior CT. Maximal small bowel caliber is approximately 5 cm. No evidence of free intraperitoneal air or pneumatosis. There is some sparse air scattered in the colon and findings are consistent with partial small bowel obstruction. IMPRESSION: 1. Stable nasogastric tube positioning with the tip terminating in the region of the fundus. 2. Similar appearance dilated small bowel and sparse air in the colon consistent with partial small bowel obstruction. No evidence of free air. Electronically Signed   By: Aletta Edouard M.D.   On: 11/22/2020 08:56   DG Abd 1 View  Result Date: 11/21/2020 CLINICAL DATA:  NG tube placement EXAM: ABDOMEN - 1 VIEW COMPARISON:  11/21/2020 FINDINGS: NG tube in the upper abdomen, NG tube tip in the fundus of the stomach. Dilated bowel loops again noted as seen on CT compatible with small bowel obstruction. IMPRESSION: NG tube tip in the fundus of the stomach. Electronically Signed   By: Rolm Baptise M.D.   On: 11/21/2020 19:09   DG Abd 1 View  Result Date: 11/21/2020 CLINICAL DATA:  NG tube placement EXAM: ABDOMEN - 1 VIEW COMPARISON:   11/21/2020 FINDINGS: NG tube tip is in the upper thoracic esophagus near the thoracic inlet. Cardiomegaly. Bibasilar atelectasis. Small left effusion. IMPRESSION: NG tube tip in the upper thoracic esophagus near the thoracic inlet. Electronically Signed   By: Rolm Baptise M.D.   On: 11/21/2020 18:05   DG Abd 1 View  Result Date: 11/21/2020 CLINICAL DATA:  70 year old male with small bowel obstruction. EXAM: ABDOMEN - 1 VIEW COMPARISON:  CT Abdomen and Pelvis 11/20/2020. FINDINGS: Portable AP supine view at 0443 hours. Small-bowel obstruction pattern persists, and has not significantly changed. Some of the gas-filled small bowel loops may be slightly less dilated since yesterday. Excreted IV contrast  in the urinary bladder. No enteric tube identified. Stable elevated left hemidiaphragm. No acute osseous abnormality identified. IMPRESSION: Small-bowel obstruction gas pattern not significantly changed since yesterday. Electronically Signed   By: Genevie Ann M.D.   On: 11/21/2020 06:09   CT ABDOMEN PELVIS W CONTRAST  Result Date: 11/20/2020 CLINICAL DATA:  Acute abdominal pain.  Vomiting. EXAM: CT ABDOMEN AND PELVIS WITH CONTRAST TECHNIQUE: Multidetector CT imaging of the abdomen and pelvis was performed using the standard protocol following bolus administration of intravenous contrast. CONTRAST:  142m OMNIPAQUE IOHEXOL 350 MG/ML SOLN COMPARISON:  CT abdomen 04/22/2018 FINDINGS: Lower chest: Atelectasis LEFT lung base. Elevated LEFT hemidiaphragm diaphragm Hepatobiliary: Simple fluid attenuation lesion in the LEFT hepatic lobe not changed from prior. Probable collection of small benign cysts. No biliary duct dilatation. Gallbladder normal. Suggestion of sludge within the gallbladder. Pancreas: Pancreas is normal. No ductal dilatation. No pancreatic inflammation. Spleen: Normal spleen Adrenals/urinary tract: Adrenal glands and kidneys are normal. The ureters and bladder normal. Stomach/Bowel: Stomach is normal.   Duodenum is normal. There is laxity of the abdominal wall such that the entirety of the abdomen is not imaged. There are dilated loops of small bowel within the mid jejunum and ileum measuring up to 5.4 cm (coronal image 23/series 5) the small bowel is collapsed leading up to the terminal ileum. Appendix normal. No transitional point is identified No pneumatosis or portal venous gas. Small volume stool in the colon. Gas throughout colon. Gas in the rectum. Vascular/Lymphatic: Abdominal aorta is normal caliber with atherosclerotic calcification. There is no retroperitoneal or periportal lymphadenopathy. No pelvic lymphadenopathy. Reproductive: Prostate un remark Other: No intraperitoneal free air.  No intraperitoneal free fluid Musculoskeletal: No aggressive osseous lesion. IMPRESSION: 1. Small bowel obstruction pattern with multiple dilated loops of mid small bowel and collapsed distal ileum. No discrete transition point identified. 2. No pneumatosis or portal venous gas identified.  No perforation 3. Stool and gas in the colon suggests early obstruction. Electronically Signed   By: SSuzy BouchardM.D.   On: 11/20/2020 17:32   DG Chest Port 1 View  Result Date: 11/26/2020 CLINICAL DATA:  Status post PICC placement. EXAM: PORTABLE CHEST 1 VIEW COMPARISON:  April 22, 2020. FINDINGS: Stable cardiomegaly with central pulmonary vascular congestion. No pneumothorax is noted. Right-sided PICC line is noted with distal tip in expected position of the SVC. Elevated left hemidiaphragm is noted. Bilateral pleural effusions are noted with associated atelectasis. Bony thorax is unremarkable. IMPRESSION: Interval placement of right-sided PICC line with distal tip in expected position of the SVC. Stable cardiomegaly with central pulmonary vascular congestion. Bilateral pleural effusions are noted with associated subsegmental atelectasis. Electronically Signed   By: JMarijo ConceptionM.D.   On: 11/26/2020 14:16   DG Abd  Portable 1V-Small Bowel Obstruction Protocol-initial, 8 hr delay  Result Date: 11/22/2020 CLINICAL DATA:  Small bowel obstruction EXAM: PORTABLE ABDOMEN - 1 VIEW COMPARISON:  11/22/2020 FINDINGS: Continued gaseous distention of bowel. This appears to involve both large and small bowel raising the possibility of ileus. No free air organomegaly. IMPRESSION: Continued gaseous distention of bowel, both large and small bowel currently noted. Question ileus versus bowel obstruction. Electronically Signed   By: KRolm BaptiseM.D.   On: 11/22/2020 18:25   ECHOCARDIOGRAM COMPLETE  Result Date: 11/26/2020    ECHOCARDIOGRAM REPORT   Patient Name:   Jesus WOODRINGDate of Exam: 11/26/2020 Medical Rec #:  [id number]     Height:       73.0  in Accession #:    [id number]     Weight:       373.5 lb Date of Birth:  1950/10/24      BSA:          2.806 m Patient Age:    42 years       BP:           137/43 mmHg Patient Gender: M              HR:           106 bpm. Exam Location:  [facility] Procedure: 2D Echo, Cardiac Doppler and Color Doppler Indications:    Atrial Fibrillation  History:        Patient has prior history of Echocardiogram examinations, most                 recent 11/04/2018. CHF, COPD, Arrythmias:Atrial Fibrillation;                 Risk Factors:Hypertension and Current Smoker.  Sonographer:    Wenda Low Referring Phys: [phone] Litisha Guagliardo IMPRESSIONS  1. Left ventricular ejection fraction, by estimation, is 55 to 60%. The left ventricle has normal function. Left ventricular endocardial border not optimally defined to evaluate regional wall motion. There is mild left ventricular hypertrophy. Left ventricular diastolic parameters are indeterminate.  2. Right ventricular systolic function is normal. The right ventricular size is normal. There is moderately elevated pulmonary artery systolic pressure.  3. Left atrial size was upper normal.  4. Right atrial size was upper normal.  5. The mitral valve is grossly normal.  Trivial mitral valve regurgitation.  6. The aortic valve is tricuspid. Aortic valve regurgitation is not visualized. Aortic valve mean gradient measures 5.0 mmHg.  7. Aortic dilatation noted. There is mild dilatation of the aortic root, measuring 40 mm. Comparison(s): Prior images reviewed side by side. Stable LVEF and aortic root size. PASP has increased. FINDINGS  Left Ventricle: Left ventricular ejection fraction, by estimation, is 55 to 60%. The left ventricle has normal function. Left ventricular endocardial border not optimally defined to evaluate regional wall motion. The left ventricular internal cavity size was normal in size. There is mild left ventricular hypertrophy. Left ventricular diastolic parameters are indeterminate. Right Ventricle: The right ventricular size is normal. No increase in right ventricular wall thickness. Right ventricular systolic function is normal. There is moderately elevated pulmonary artery systolic pressure. The tricuspid regurgitant velocity is 3.43 m/s, and with an assumed right atrial pressure of 8 mmHg, the estimated right ventricular systolic pressure is 123456 mmHg. Left Atrium: Left atrial size was upper normal. Right Atrium: Right atrial size was upper normal. Pericardium: There is no evidence of pericardial effusion. Presence of pericardial fat pad. Mitral Valve: The mitral valve is grossly normal. Mild mitral annular calcification. Trivial mitral valve regurgitation. MV peak gradient, 6.1 mmHg. The mean mitral valve gradient is 2.0 mmHg. Tricuspid Valve: The tricuspid valve is grossly normal. Tricuspid valve regurgitation is mild. Aortic Valve: The aortic valve is tricuspid. There is mild aortic valve annular calcification. Aortic valve regurgitation is not visualized. Aortic valve mean gradient measures 5.0 mmHg. Aortic valve peak gradient measures 8.9 mmHg. Aortic valve area, by  VTI measures 1.74 cm. Pulmonic Valve: The pulmonic valve was grossly normal. Pulmonic  valve regurgitation is trivial. Aorta: The aortic root is normal in size and structure and aortic dilatation noted. There is mild dilatation of the aortic root, measuring 40 mm. IAS/Shunts: No atrial level  shunt detected by color flow Doppler.  LEFT VENTRICLE PLAX 2D LVIDd:         4.92 cm  Diastology LVIDs:         2.88 cm  LV e' medial:    14.70 cm/s LV PW:         1.23 cm  LV E/e' medial:  6.8 LV IVS:        1.25 cm  LV e' lateral:   14.00 cm/s LVOT diam:     2.00 cm  LV E/e' lateral: 7.1 LV SV:         53 LV SV Index:   19 LVOT Area:     3.14 cm  RIGHT VENTRICLE RV Basal diam:  4.35 cm RV Mid diam:    3.70 cm RV S prime:     19.40 cm/s TAPSE (M-mode): 3.4 cm LEFT ATRIUM             Index       RIGHT ATRIUM           Index LA diam:        4.20 cm 1.50 cm/m  RA Area:     29.40 cm LA Vol (A2C):   53.8 ml 19.18 ml/m RA Volume:   99.20 ml  35.36 ml/m LA Vol (A4C):   96.5 ml 34.39 ml/m LA Biplane Vol: 77.4 ml 27.59 ml/m  AORTIC VALVE AV Area (Vmax):    2.02 cm AV Area (Vmean):   1.73 cm AV Area (VTI):     1.74 cm AV Vmax:           149.00 cm/s AV Vmean:          101.000 cm/s AV VTI:            0.305 m AV Peak Grad:      8.9 mmHg AV Mean Grad:      5.0 mmHg LVOT Vmax:         96.00 cm/s LVOT Vmean:        55.500 cm/s LVOT VTI:          0.169 m LVOT/AV VTI ratio: 0.55  AORTA Ao Root diam: 3.70 cm MITRAL VALVE               TRICUSPID VALVE MV Area (PHT): 3.97 cm    TR Peak grad:   47.1 mmHg MV Area VTI:   1.57 cm    TR Vmax:        343.00 cm/s MV Peak grad:  6.1 mmHg MV Mean grad:  2.0 mmHg    SHUNTS MV Vmax:       1.23 m/s    Systemic VTI:  0.17 m MV Vmean:      57.3 cm/s   Systemic Diam: 2.00 cm MV Decel Time: 191 msec MV E velocity: 99.80 cm/s Rozann Lesches MD Electronically signed by Rozann Lesches MD Signature Date/Time: 11/26/2020/5:14:59 PM    Final    [place] EKG SITE RITE  Result Date: 11/26/2020 If Site Rite image not attached, placement could not be confirmed due to current cardiac rhythm.    Microbiology: Recent Results (from the past 240 hour(s))  Resp Panel by RT-PCR (Flu A&B, Covid) Nasopharyngeal Swab     Status: None   Collection Time: 11/20/20  3:31 PM   Specimen: Nasopharyngeal Swab; Nasopharyngeal(NP) swabs in vial transport medium  Result Value Ref Range Status   SARS Coronavirus 2 by RT PCR NEGATIVE NEGATIVE Final    Comment: (NOTE)  SARS-CoV-2 target nucleic acids are NOT DETECTED.  The SARS-CoV-2 RNA is generally detectable in upper respiratory specimens during the acute phase of infection. The lowest concentration of SARS-CoV-2 viral copies this assay can detect is 138 copies/mL. A negative result does not preclude SARS-Cov-2 infection and should not be used as the sole basis for treatment or other patient management decisions. A negative result may occur with  improper specimen collection/handling, submission of specimen other than nasopharyngeal swab, presence of viral mutation(s) within the areas targeted by this assay, and inadequate number of viral copies(<138 copies/mL). A negative result must be combined with clinical observations, patient history, and epidemiological information. The expected result is Negative.  Fact Sheet for Patients:  EntrepreneurPulse.com.au  Fact Sheet for Healthcare Providers:  IncredibleEmployment.be  This test is no t yet approved or cleared by the [place] FDA and  has been authorized for detection and/or diagnosis of SARS-CoV-2 by FDA under an Emergency Use Authorization (EUA). This EUA will remain  in effect (meaning this test can be used) for the duration of the COVID-19 declaration under Section 564(b)(1) of the Act, 21 U.S.C.section 360bbb-3(b)(1), unless the authorization is terminated  or revoked sooner.       Influenza A by PCR NEGATIVE NEGATIVE Final   Influenza B by PCR NEGATIVE NEGATIVE Final    Comment: (NOTE) The Xpert Xpress SARS-CoV-2/FLU/RSV plus assay is  intended as an aid in the diagnosis of influenza from Nasopharyngeal swab specimens and should not be used as a sole basis for treatment. Nasal washings and aspirates are unacceptable for Xpert Xpress SARS-CoV-2/FLU/RSV testing.  Fact Sheet for Patients: EntrepreneurPulse.com.au  Fact Sheet for Healthcare Providers: IncredibleEmployment.be  This test is not yet approved or cleared by the [place] FDA and has been authorized for detection and/or diagnosis of SARS-CoV-2 by FDA under an Emergency Use Authorization (EUA). This EUA will remain in effect (meaning this test can be used) for the duration of the COVID-19 declaration under Section 564(b)(1) of the Act, 21 U.S.C. section 360bbb-3(b)(1), unless the authorization is terminated or revoked.  Performed at [facility] [address], [place], [place] [address]   MRSA Next Gen by PCR, Nasal     Status: None   Collection Time: 11/25/20  6:31 AM   Specimen: Nasal Mucosa; Nasal Swab  Result Value Ref Range Status   MRSA by PCR Next Gen NOT DETECTED NOT DETECTED Final    Comment: (NOTE) The GeneXpert MRSA Assay (FDA approved for NASAL specimens only), is one component of a comprehensive MRSA colonization surveillance program. It is not intended to diagnose MRSA infection nor to guide or monitor treatment for MRSA infections. Test performance is not FDA approved in patients less than 85 years old. Performed at [facility], [address] [place], [place] [address]   Culture, blood (Routine X 2) w Reflex to ID Panel     Status: None (Preliminary result)   Collection Time: 11/25/20  8:17 AM   Specimen: Right Antecubital; Blood  Result Value Ref Range Status   Specimen Description   Final    RIGHT ANTECUBITAL BOTTLES DRAWN AEROBIC AND ANAEROBIC   Special Requests   Final    Blood Culture results may not be optimal due to an excessive volume of blood received in culture bottles   Culture    Final    NO GROWTH 2 DAYS Performed at [facility], [address] [place], [place] [address]    Report Status PENDING  Incomplete  Culture, blood (  Routine X 2) w Reflex to ID Panel     Status: None (Preliminary result)   Collection Time: 11/25/20  8:28 AM   Specimen: BLOOD RIGHT HAND  Result Value Ref Range Status   Specimen Description   Final    BLOOD RIGHT HAND BOTTLES DRAWN AEROBIC AND ANAEROBIC   Special Requests   Final    Blood Culture results may not be optimal due to an excessive volume of blood received in culture bottles   Culture   Final    NO GROWTH 2 DAYS Performed at [facility] [address] [place], [place] [address]    Report Status PENDING  Incomplete     Labs: Basic Metabolic Panel: Recent Labs  Lab 11/22/20 0500 11/23/20 0459 11/24/20 0610 11/25/20 0557 11/26/20 0422  NA 137 140 134* 130* 134*  K 3.4* 3.8 4.2 3.7 3.9  CL 97* 101 95* 98 97*  CO2 32 '29 30 29 28  '$ GLUCOSE 91 94 124* 100* 150*  BUN '20 12 13 '$ 27* 29*  CREATININE 0.84 0.81 1.32* 1.60* 1.09  CALCIUM 8.4* 8.4* 8.4* 7.2* 7.8*  MG 2.0 2.0 1.7 1.8 2.0  PHOS  --   --  2.4* 3.8 4.2   Liver Function Tests: Recent Labs  Lab 11/20/20 1550 11/21/20 0431 11/22/20 0500 11/26/20 0422  AST 32 '23 20 17  '$ ALT '18 17 18 13  '$ ALKPHOS 78 70 73 59  BILITOT 1.5* 1.6* 1.6* 1.1  PROT 6.8 5.9* 6.2* 6.1*  ALBUMIN 3.2* 2.8* 2.9* 2.6*   Recent Labs  Lab 11/20/20 1550  LIPASE 21   No results for input(s): AMMONIA in the last 168 hours. CBC: Recent Labs  Lab 11/20/20 1550 11/21/20 0431 11/22/20 0500 11/24/20 0610 11/25/20 0601 11/25/20 0822 11/26/20 0422  WBC 9.0 7.6 8.6 8.7 7.8 7.2 7.1  NEUTROABS 7.1 5.8  --   --   --   --   --   HGB 13.5 12.3* 12.6* 13.0 11.8* 12.0* 12.5*  HCT 41.6 38.4* 39.7 41.7 36.6* 37.6* 39.3  MCV 93.9 94.1 95.4 98.1 95.8 95.7 96.1  PLT 192 176 158 181 180 175 253   Cardiac Enzymes: No results for input(s): CKTOTAL, CKMB, CKMBINDEX, TROPONINI in the last 168  hours. BNP: Invalid input(s): POCBNP CBG: No results for input(s): GLUCAP in the last 168 hours.  Time coordinating discharge:  36 minutes  Signed:  Orson Eva, DO [facility] Pager: [phone] 11/27/2020, 12:53 PM

## 2020-11-28 LAB — URINE CULTURE: Culture: NO GROWTH

## 2020-11-30 LAB — CULTURE, BLOOD (ROUTINE X 2)
Culture: NO GROWTH
Culture: NO GROWTH

## 2020-12-05 ENCOUNTER — Ambulatory Visit: Payer: Medicare Other | Admitting: Nurse Practitioner

## 2020-12-27 ENCOUNTER — Other Ambulatory Visit: Payer: Self-pay | Admitting: Cardiology

## 2020-12-27 ENCOUNTER — Ambulatory Visit (INDEPENDENT_AMBULATORY_CARE_PROVIDER_SITE_OTHER): Payer: Medicare Other | Admitting: *Deleted

## 2020-12-27 DIAGNOSIS — I4891 Unspecified atrial fibrillation: Secondary | ICD-10-CM | POA: Diagnosis not present

## 2020-12-27 DIAGNOSIS — Z5181 Encounter for therapeutic drug level monitoring: Secondary | ICD-10-CM

## 2020-12-27 LAB — POCT INR: INR: 2.2 (ref 2.0–3.0)

## 2020-12-27 NOTE — Patient Instructions (Signed)
Description    Continue to take warfarin 1 tablet daily except for 1/2 a tablet on Sunday and Thursdays. Recheck INR 6 weeks.

## 2021-01-08 ENCOUNTER — Other Ambulatory Visit: Payer: Self-pay | Admitting: Cardiology

## 2021-01-08 ENCOUNTER — Ambulatory Visit: Payer: Medicare Other | Admitting: Family Medicine

## 2021-01-14 NOTE — Progress Notes (Signed)
Cardiology Office Note  Date: 01/15/2021   ID: Jesus Rodriguez, DOB November 16, 1950, MRN [id number]  PCP:  [facility]  Cardiologist:  Carlyle Dolly, MD Electrophysiologist:  Thompson Grayer, MD   Chief Complaint: 6-week follow-up   History of Present Illness: Jesus Rodriguez is a 70 y.o. male with a history of atrial fibrillation/flutter, chronic diastolic heart failure, COPD, OSA on CPAP.  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  Previously followed by [facility].  Failed sotalol, started on dofetilide, followed by Dr. Rayann Heman at EP some recent palpitations, heart rates in the 60s and 70s.  Chronic diastolic heart failure.  11/01/2018 echo showed LVEF of 55 to 60%.  Phone call end of April concerning increased edema and shortness of breath.  Torsemide was increased to 60 mg p.o. twice daily.    Previously saw Dr. Harl Bowie 11/22/2019 via telemedicine visit. He had some recent palpitations and heart rates were in the 60s and 70s.  Office weight on 09/06/2019 was 396 pounds.  Home weight was 388-393 pounds. Being followed by pulmonary in [place] for COPD and OSA.   He saw Dr. Rayann Heman on 07/13/2020 for routine electrophysiology follow-up.  He reported doing reasonably well.  He was not very active.  Reported positive shortness of breath with heavy activity.  He was otherwise without complaint.  His CHA2DS2-VASc score was 2.  He was on Coumadin.  Rate control was planned long-term.  Blood pressure was low and benazepril was decreased to 10 mg daily.  He was advised on lifestyle modification for morbid obesity.  Compliance was advised with CPAP for OSA.  Chronic diastolic dysfunction was stable with no changes.   He was recently admitted on 11/20/2020 for small bowel obstruction.Marland Kitchen  He presented with abdominal pain, nausea, vomiting.  CT in ED showed small bowel obstruction.  He was placed on IV fluids and bowel rest.  During hospitalization he developed atrial fibrillation with  RVR.  He later began having bowel movements.  On 11/24/2020 he developed A. fib with RVR with hypotension refractory to IV fluids.  He was started on Neo-Synephrine and amiodarone IV.  Cardiology was consulted.  He was given digoxin and amiodarone and weaned off IV amiodarone.  He was started back on metoprolol and tolerating well.  Echo showed EF 55 to 60%.  Mildly elevated PASP with normal RV function.    He was on chronic O2 at 4 L at home for chronic respiratory failure.  He had AKI due to volume depletion.  Baseline creatinine 0.8-0.9.  Serum creatinine peaked at 1.60.  Plans to hold torsemide temporarily and restart after discharge.  Serum creatinine was 1.09 at time of discharge.  His benazepril and metoprolol were held due to hypotension.  Toprol was restarted after blood pressure improved.  He was continuing Zocor for hyperlipidemia.  He was normally on torsemide 60 mg p.o. twice daily.  Plan was to restart when hemodynamically stable per hospitalist note.  Normally on CPAP at home.  Tobacco cessation was discussed.  Lifestyle modification was discussed for morbid obesity with BMI of 48    He is here with with his daughter today for hospital follow-up.  He denies any rapid heart rates since discharge.  His weight today is 355.  He states his weight at hospital discharge was 361.  Currently denies any SOB or DOE.  He is on chronic O2 for chronic respiratory failure.  States his swelling is much better in his lower extremities.  Blood  pressure is low normal at 100/62.  He denies any issues with that blood pressure.  Denies any orthopnea or PND.  Heart rate is controlled today at 91 on metoprolol 75 mg p.o. twice daily.  He continues Coumadin as directed by Coumadin clinic.  He denies any bleeding on Coumadin.  He is finishing his course of Levaquin for bronchitis.  He states today is the last day.    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     albuterol (VENTOLIN HFA) 108 (90 Base) MCG/ACT inhaler Inhale 1 puff into the lungs every 6 (six) hours as needed.     benazepril (LOTENSIN) 10 MG tablet Take 1 tablet (10 mg total) by mouth daily. 30 tablet 6   ketoconazole (NIZORAL) 2 % cream Apply 1 application topically 2 (two) times daily as needed for irritation.     levofloxacin (LEVAQUIN) 500 MG tablet Take 500 mg by mouth daily. Today is last day.     Metoprolol Tartrate 75 MG TABS TAKE 1 TABLET TWICE A DAY 180 tablet 2   simvastatin (ZOCOR) 20 MG tablet TAKE 1 TABLET BY MOUTH DAILY AT 6 PM. 90 tablet 1   tamsulosin (FLOMAX) 0.4 MG CAPS capsule Take by mouth daily.     torsemide (DEMADEX) 20 MG tablet Take 3 tablets (60 mg total) by mouth 2 (two) times daily. 180 tablet 6   TRELEGY ELLIPTA 100-62.5-25 MCG/INH AEPB Inhale 1 puff into the lungs daily.     triamcinolone (KENALOG) 0.1 % Apply 1 application topically 3 (three) times daily.     warfarin (COUMADIN) 10 MG tablet TAKE 1/2 TO 1 TABLET DAILY AS DIRECTED BY COUMADIN CLINIC. 90 tablet 3   No current facility-administered medications for  this visit.   Allergies:  Celebrex [celecoxib], Methotrexate derivatives, and Sulfa antibiotics   Social History: The patient  reports that he has been smoking cigarettes. He started smoking about 49 years ago. He has a 70.00 pack-year smoking history. He has never used smokeless tobacco. He reports that he does not drink alcohol and does not use drugs.   Family History: The patient's family history includes Diabetes in his son; Epilepsy in his son; Heart attack in his father; Hypertension in his father, sister, sister, and sister; Prostate cancer in his father.   ROS:  Please see the history of present illness. Otherwise, complete review of systems is positive for none.  All other systems are reviewed and negative.   Physical Exam: VS:  BP 100/62   Pulse 91  Ht 6\' 1"  (1.854 m)   Wt (!) 355 lb (161 kg)   SpO2 100% Comment: doing 2L/ming today  BMI 46.84 kg/m , BMI Body mass index is 46.84 kg/m.  Wt Readings from Last 3 Encounters:  01/15/21 (!) 355 lb (161 kg)  11/27/20 (!) 385 lb 5.8 oz (174.8 kg)  11/15/20 (!) 365 lb 6.4 oz (165.7 kg)    General: Severe morbid obese patient appears comfortable at rest. Neck: Supple, no elevated JVP or carotid bruits, no thyromegaly. Lungs: Clear to auscultation, prolonged expiratory phase, nonlabored breathing at rest. Cardiac: Irregularly irregular rate and rhythm, no S3 or significant systolic murmur, no pericardial rub. Extremities: No pitting edema, distal pulses 2+. Skin: Warm and dry. Musculoskeletal: No kyphosis. Neuropsychiatric: Alert and oriented x3, affect grossly appropriate.  ECG:  EKG November 29, 2019 atrial fibrillation with heart rate of 91.  ST and T wave abnormality consider anterolateral ischemia.  Recent Labwork: 11/24/2020: B Natriuretic Peptide 164.0 11/25/2020: TSH 1.421 11/26/2020: ALT 13; AST 17; BUN 29; Creatinine, Ser 1.09; Hemoglobin 12.5; Magnesium 2.0; Platelets 253; Potassium 3.9; Sodium 134  No results found for:  CHOL, TRIG, HDL, CHOLHDL, VLDL, LDLCALC, LDLDIRECT  Other Studies Reviewed Today:  Echocardiogram 11/26/2020  Left ventricular ejection fraction, by estimation, is 55 to 60%. The left ventricle has normal function. Left ventricular endocardial border not optimally defined to evaluate regional wall motion. There is mild left ventricular hypertrophy. Left ventricular diastolic parameters are indeterminate. 1. Right ventricular systolic function is normal. The right ventricular size is normal. There is moderately elevated pulmonary artery systolic pressure. 2. 3. Left atrial size was upper normal. 4. Right atrial size was upper normal. 5. The mitral valve is grossly normal. Trivial mitral valve regurgitation. The aortic valve is tricuspid. Aortic valve regurgitation is not visualized. Aortic valve mean gradient measures 5.0 mmHg. 6. 7. Aortic dilatation noted. There is mild dilatation of the aortic root, measuring 40 mm. Comparison(s): Prior images reviewed side by side. Stable LVEF and aortic root size. PASP has increased.    10/2018 echo  1. Images are limited.  2. The left ventricle has grossly normal systolic function, with an ejection fraction of 55-60%. The cavity size was normal. There is mildly increased left ventricular wall thickness. Left ventricular diastolic Doppler parameters are indeterminate.  3. The right ventricle has normal systolic function. The cavity was normal. There is no increase in right ventricular wall thickness. Right ventricular systolic pressure is normal with an estimated pressure of 12.5 mmHg.  4. The aortic valve is tricuspid. Mild calcification of the aortic valve. Mild aortic annular calcification noted.  5. The mitral valve is grossly normal. There is mild mitral annular calcification present.  6. The tricuspid valve is grossly normal.  7. The aorta is abnormal in size and structure.  8. There is mild dilatation of the aortic root.  9. The  interatrial septum was not well visualized.  Assessment and Plan:   1. Atrial fibrillation, unspecified type ([place]) Heart rate controlled today at 91 and irregularly irregular.  Continue metoprolol 75 mg p.o. twice daily.  Continue Coumadin as directed by Coumadin clinic.  2. Acute on chronic diastolic heart failure ([place]) Had recent hospitalization for small bowel obstruction which resolved.  In the hospital with he went into atrial fibrillation with RVR.  Initially had problems with rate control.  States he had a significant amount of weight gain and edema during hospital stay.  States he eventually heart rate was controlled.  He states  discharge weight was 361 pounds.  Weight today is 355.  No lower extremity edema noted today on exam.  Continue torsemide 60 mg p.o. twice daily.  Continue benazepril 10 mg p.o. daily.  3. COPD with acute exacerbation ([place]) At last visit he had stopped smoking.  He was on continuous O2 at home 2 L.  At last visit  referral was made to pulmonary for COPD evaluation and possible PFTs.  He was previously seeing Dr. Linde Gillis pulmonologist in [place].  He recently had 2 new inhalers prescribed by Dr. Koleen Nimrod.  4. OSA (obstructive sleep apnea) Patient is on CPAP at home and compliant per his statement.  At last visit he had received his new machine and states he is very compliant with his CPAP therapy.  He states he even wears it when he takes naps during the day.  5. Obesity, Class III, BMI 40-49.9 (morbid obesity) (HCC) Significant obesity.  Advised weight loss.  Medication Adjustments/Labs and Tests Ordered: Current medicines are reviewed at length with the patient today.  Concerns regarding medicines are outlined above.   Disposition: Follow-up with Dr. Harl Bowie or APP 6 months  signed, Levell July, NP 01/15/2021 2:53 PM    [facility] [place], [place], [place] [address] Phone: [phone]; Fax:  [phone]

## 2021-01-15 ENCOUNTER — Encounter: Payer: Self-pay | Admitting: Family Medicine

## 2021-01-15 ENCOUNTER — Ambulatory Visit: Payer: Medicare Other | Admitting: Family Medicine

## 2021-01-15 VITALS — BP 100/62 | HR 91 | Ht 73.0 in | Wt 355.0 lb

## 2021-01-15 DIAGNOSIS — I5033 Acute on chronic diastolic (congestive) heart failure: Secondary | ICD-10-CM

## 2021-01-15 DIAGNOSIS — G4733 Obstructive sleep apnea (adult) (pediatric): Secondary | ICD-10-CM

## 2021-01-15 DIAGNOSIS — E66813 Obesity, class 3: Secondary | ICD-10-CM

## 2021-01-15 DIAGNOSIS — Z9989 Dependence on other enabling machines and devices: Secondary | ICD-10-CM

## 2021-01-15 DIAGNOSIS — I4891 Unspecified atrial fibrillation: Secondary | ICD-10-CM

## 2021-01-15 DIAGNOSIS — J441 Chronic obstructive pulmonary disease with (acute) exacerbation: Secondary | ICD-10-CM | POA: Diagnosis not present

## 2021-01-15 DIAGNOSIS — Z23 Encounter for immunization: Secondary | ICD-10-CM | POA: Diagnosis not present

## 2021-01-15 NOTE — Patient Instructions (Signed)

## 2021-02-07 ENCOUNTER — Other Ambulatory Visit: Payer: Self-pay

## 2021-02-07 ENCOUNTER — Ambulatory Visit (INDEPENDENT_AMBULATORY_CARE_PROVIDER_SITE_OTHER): Payer: Medicare Other | Admitting: *Deleted

## 2021-02-07 DIAGNOSIS — I4891 Unspecified atrial fibrillation: Secondary | ICD-10-CM | POA: Diagnosis not present

## 2021-02-07 DIAGNOSIS — Z5181 Encounter for therapeutic drug level monitoring: Secondary | ICD-10-CM

## 2021-02-07 LAB — POCT INR: INR: 3.9 — AB (ref 2.0–3.0)

## 2021-02-07 NOTE — Patient Instructions (Addendum)
Description    Hold warfarin today, continue to take warfarin 1 tablet daily except for 1/2 a tablet on Sunday and Thursdays. Recheck INR 3 weeks.

## 2021-02-28 ENCOUNTER — Ambulatory Visit (INDEPENDENT_AMBULATORY_CARE_PROVIDER_SITE_OTHER): Payer: Medicare Other | Admitting: *Deleted

## 2021-02-28 DIAGNOSIS — I4891 Unspecified atrial fibrillation: Secondary | ICD-10-CM

## 2021-02-28 DIAGNOSIS — Z5181 Encounter for therapeutic drug level monitoring: Secondary | ICD-10-CM

## 2021-02-28 LAB — POCT INR: INR: 3.4 — AB (ref 2.0–3.0)

## 2021-02-28 NOTE — Patient Instructions (Signed)
Hold warfarin today then decrease dose to 1 tablet daily except for 1/2  tablet on Sunday, Tuesdays and Thursdays. Recheck INR 3 weeks.

## 2021-03-26 ENCOUNTER — Ambulatory Visit (INDEPENDENT_AMBULATORY_CARE_PROVIDER_SITE_OTHER): Payer: Medicare Other | Admitting: *Deleted

## 2021-03-26 DIAGNOSIS — I4891 Unspecified atrial fibrillation: Secondary | ICD-10-CM | POA: Diagnosis not present

## 2021-03-26 DIAGNOSIS — Z5181 Encounter for therapeutic drug level monitoring: Secondary | ICD-10-CM

## 2021-03-26 LAB — POCT INR: INR: 2.3 (ref 2.0–3.0)

## 2021-03-26 NOTE — Patient Instructions (Signed)
Continue warfarin 1 tablet daily except for 1/2  tablet on Sunday, Tuesdays and Thursdays. Recheck INR 4 weeks.

## 2021-03-29 ENCOUNTER — Other Ambulatory Visit: Payer: Self-pay | Admitting: Cardiology

## 2021-04-23 ENCOUNTER — Ambulatory Visit (INDEPENDENT_AMBULATORY_CARE_PROVIDER_SITE_OTHER): Payer: Medicare Other | Admitting: *Deleted

## 2021-04-23 DIAGNOSIS — I4891 Unspecified atrial fibrillation: Secondary | ICD-10-CM | POA: Diagnosis not present

## 2021-04-23 DIAGNOSIS — Z5181 Encounter for therapeutic drug level monitoring: Secondary | ICD-10-CM | POA: Diagnosis not present

## 2021-04-23 LAB — POCT INR: INR: 2.4 (ref 2.0–3.0)

## 2021-04-23 NOTE — Patient Instructions (Signed)
Continue warfarin 1 tablet daily except for 1/2  tablet on Sunday, Tuesdays and Thursdays. Recheck INR 5 weeks.

## 2021-04-25 DIAGNOSIS — Z9981 Dependence on supplemental oxygen: Secondary | ICD-10-CM | POA: Insufficient documentation

## 2021-05-21 ENCOUNTER — Ambulatory Visit: Payer: Medicare Other | Admitting: Cardiology

## 2021-07-08 ENCOUNTER — Ambulatory Visit (INDEPENDENT_AMBULATORY_CARE_PROVIDER_SITE_OTHER): Payer: Medicare Other | Admitting: *Deleted

## 2021-07-08 DIAGNOSIS — I4891 Unspecified atrial fibrillation: Secondary | ICD-10-CM | POA: Diagnosis not present

## 2021-07-08 DIAGNOSIS — Z5181 Encounter for therapeutic drug level monitoring: Secondary | ICD-10-CM | POA: Diagnosis not present

## 2021-07-08 LAB — POCT INR: INR: 2.2 (ref 2.0–3.0)

## 2021-07-08 NOTE — Patient Instructions (Signed)
Continue warfarin 1 tablet daily except for 1/2  tablet on Sunday, Tuesdays and Thursdays. Recheck INR 6 weeks.  ?

## 2021-07-14 ENCOUNTER — Other Ambulatory Visit: Payer: Self-pay | Admitting: Cardiology

## 2021-07-15 ENCOUNTER — Other Ambulatory Visit: Payer: Self-pay | Admitting: Cardiology

## 2021-07-29 IMAGING — DX DG CHEST 2V
2 series · 2 of 2 positions shown · non-contrast
Comparison: 11/27/2019

CLINICAL DATA: Short of breath several years. History of scrotal
cancer and hypertension

EXAM:
CHEST - 2 VIEW

[chest lat]
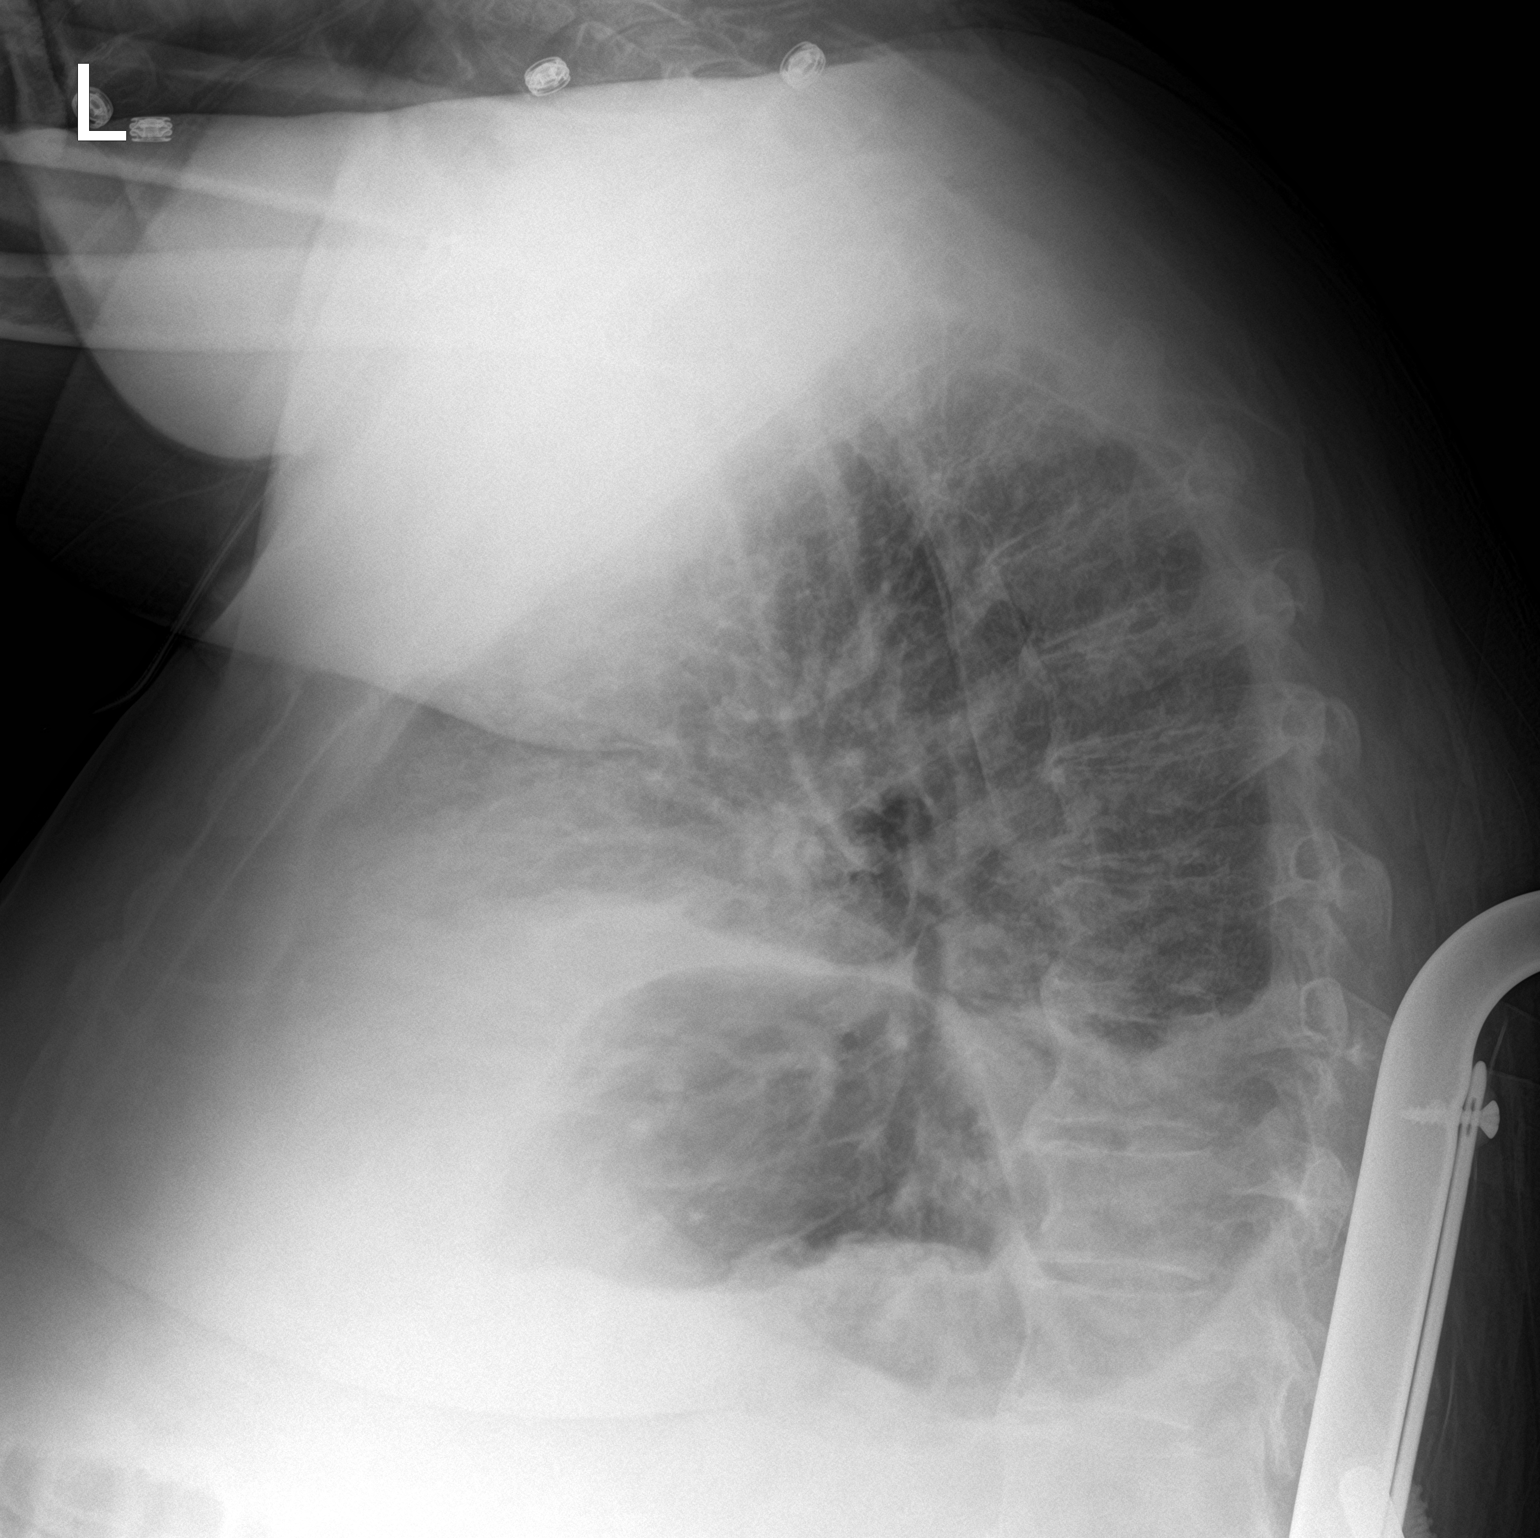

[chest ap]
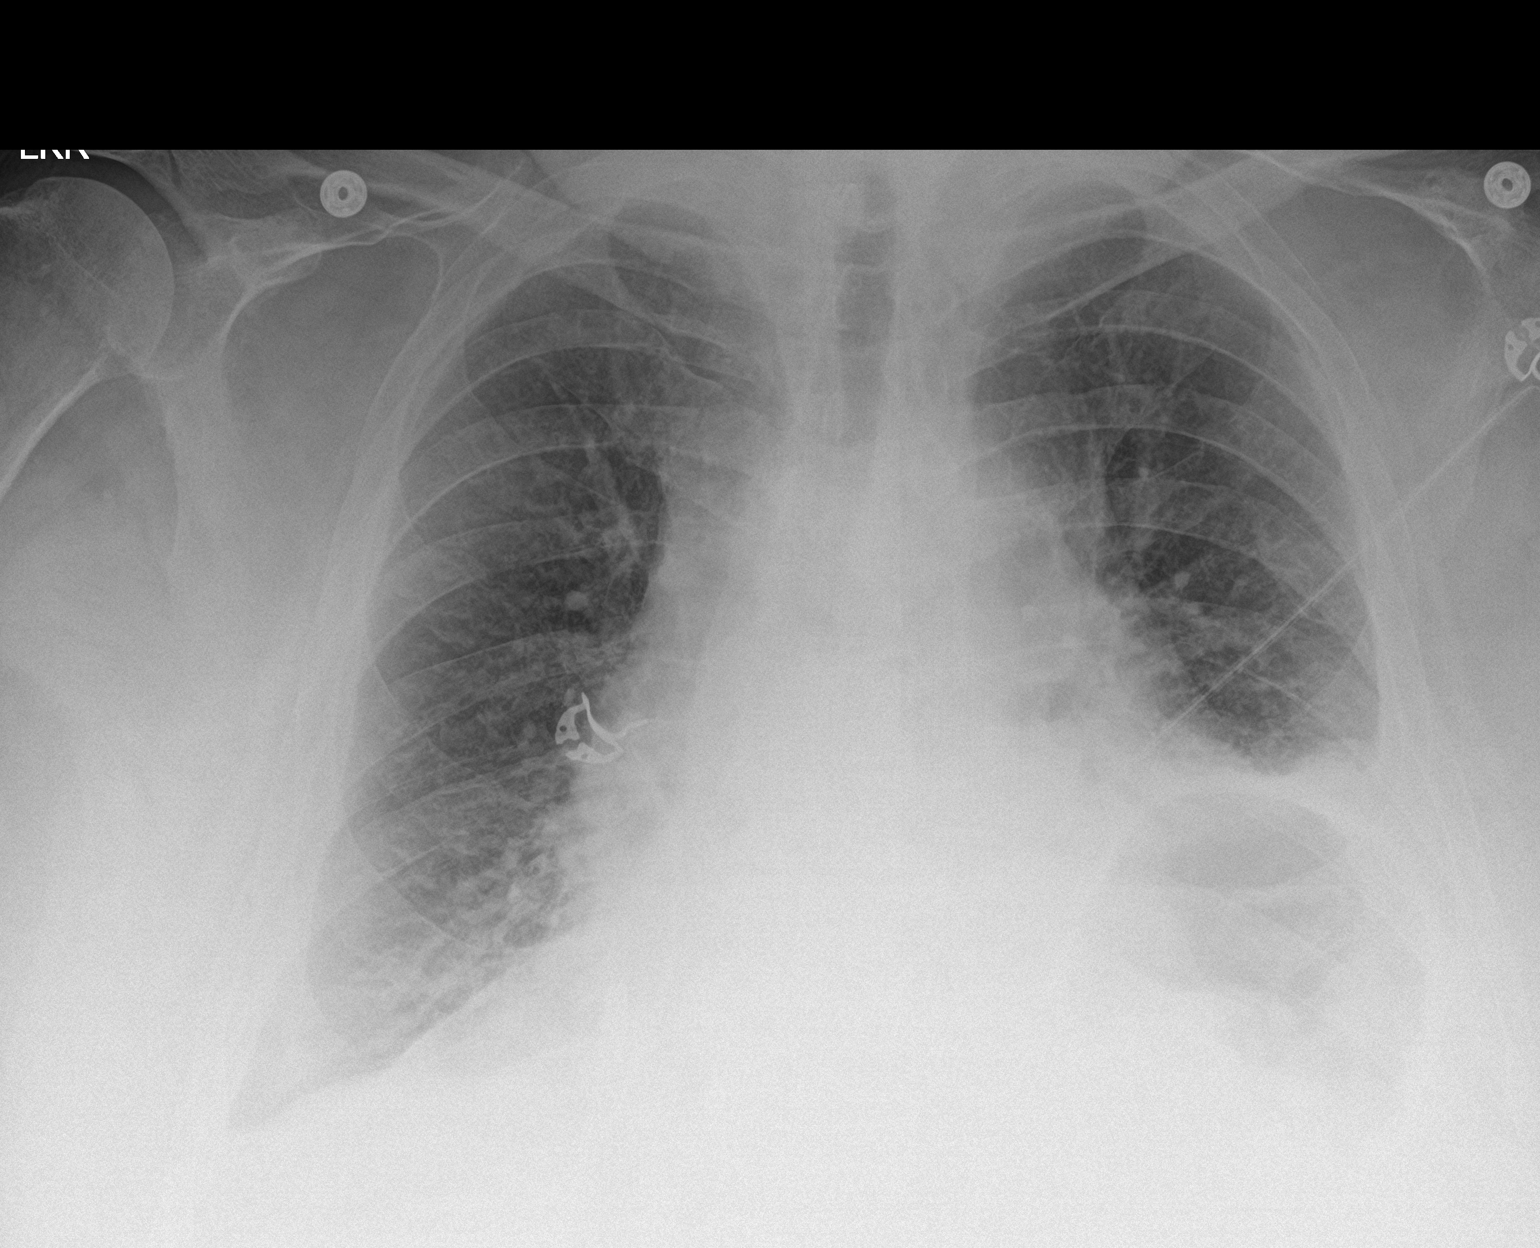

[2 of 2 positions shown; findings below may reference images not displayed]

FINDINGS: Cardiac enlargement.  Interval improvement in vascular congestion

Elevated left hemidiaphragm with left lower lobe atelectasis
unchanged. Possible small left pleural effusion. Improved aeration
in the right lung base. No airspace disease or effusion on the
right.
IMPRESSION: Improvement in vascular congestion.

Left lower lobe atelectasis remains. Improved aeration in the right
lung base.

## 2021-08-07 ENCOUNTER — Ambulatory Visit: Payer: Medicare Other | Admitting: Cardiology

## 2021-08-22 ENCOUNTER — Ambulatory Visit (INDEPENDENT_AMBULATORY_CARE_PROVIDER_SITE_OTHER): Payer: Medicare Other | Admitting: *Deleted

## 2021-08-22 DIAGNOSIS — Z5181 Encounter for therapeutic drug level monitoring: Secondary | ICD-10-CM | POA: Diagnosis not present

## 2021-08-22 DIAGNOSIS — I4891 Unspecified atrial fibrillation: Secondary | ICD-10-CM | POA: Diagnosis not present

## 2021-08-22 LAB — POCT INR: INR: 2 (ref 2.0–3.0)

## 2021-08-22 NOTE — Patient Instructions (Signed)
Continue warfarin 1 tablet daily except for 1/2  tablet on Sunday, Tuesdays and Thursdays. Recheck INR 7 weeks.  

## 2021-09-05 ENCOUNTER — Other Ambulatory Visit: Payer: Self-pay | Admitting: Internal Medicine

## 2021-09-10 ENCOUNTER — Telehealth: Payer: Self-pay | Admitting: Cardiology

## 2021-09-10 NOTE — Telephone Encounter (Signed)
Seen by Katina Dung, NP last - no mention of pending labs prior to next OV.  Please advise.

## 2021-09-10 NOTE — Telephone Encounter (Signed)
Patient has appt on 6/8, he wants to know if he should have lab work done prior to his appt.

## 2021-09-11 NOTE — Telephone Encounter (Signed)
Nothing ahead of time, during appt will determine if anything needed at that time   Zandra Abts MD

## 2021-09-12 ENCOUNTER — Encounter: Payer: Self-pay | Admitting: *Deleted

## 2021-09-12 NOTE — Telephone Encounter (Signed)
Notified via mychart.

## 2021-09-17 ENCOUNTER — Inpatient Hospital Stay (HOSPITAL_COMMUNITY)
Admission: EM | Admit: 2021-09-17 | Discharge: 2021-09-20 | DRG: 291 | Disposition: A | Discharge status: Home Health | Payer: Medicare Other | Attending: Family Medicine | Admitting: Family Medicine

## 2021-09-17 ENCOUNTER — Emergency Department (HOSPITAL_COMMUNITY): Payer: Medicare Other

## 2021-09-17 ENCOUNTER — Other Ambulatory Visit (HOSPITAL_COMMUNITY): Payer: Medicare Other

## 2021-09-17 ENCOUNTER — Other Ambulatory Visit: Payer: Self-pay

## 2021-09-17 ENCOUNTER — Encounter (HOSPITAL_COMMUNITY): Payer: Self-pay | Admitting: Emergency Medicine

## 2021-09-17 DIAGNOSIS — Z20822 Contact with and (suspected) exposure to covid-19: Secondary | ICD-10-CM | POA: Diagnosis present

## 2021-09-17 DIAGNOSIS — Z79899 Other long term (current) drug therapy: Secondary | ICD-10-CM | POA: Diagnosis not present

## 2021-09-17 DIAGNOSIS — Z6841 Body Mass Index (BMI) 40.0 and over, adult: Secondary | ICD-10-CM | POA: Diagnosis not present

## 2021-09-17 DIAGNOSIS — I878 Other specified disorders of veins: Secondary | ICD-10-CM | POA: Diagnosis present

## 2021-09-17 DIAGNOSIS — E874 Mixed disorder of acid-base balance: Secondary | ICD-10-CM | POA: Diagnosis present

## 2021-09-17 DIAGNOSIS — Z7951 Long term (current) use of inhaled steroids: Secondary | ICD-10-CM | POA: Diagnosis not present

## 2021-09-17 DIAGNOSIS — I110 Hypertensive heart disease with heart failure: Principal | ICD-10-CM | POA: Diagnosis present

## 2021-09-17 DIAGNOSIS — J9622 Acute and chronic respiratory failure with hypercapnia: Secondary | ICD-10-CM | POA: Diagnosis not present

## 2021-09-17 DIAGNOSIS — F17200 Nicotine dependence, unspecified, uncomplicated: Secondary | ICD-10-CM | POA: Diagnosis present

## 2021-09-17 DIAGNOSIS — Z8249 Family history of ischemic heart disease and other diseases of the circulatory system: Secondary | ICD-10-CM

## 2021-09-17 DIAGNOSIS — M069 Rheumatoid arthritis, unspecified: Secondary | ICD-10-CM | POA: Diagnosis present

## 2021-09-17 DIAGNOSIS — I5033 Acute on chronic diastolic (congestive) heart failure: Secondary | ICD-10-CM | POA: Diagnosis present

## 2021-09-17 DIAGNOSIS — E876 Hypokalemia: Secondary | ICD-10-CM | POA: Diagnosis present

## 2021-09-17 DIAGNOSIS — F32A Depression, unspecified: Secondary | ICD-10-CM | POA: Diagnosis present

## 2021-09-17 DIAGNOSIS — I10 Essential (primary) hypertension: Secondary | ICD-10-CM | POA: Diagnosis not present

## 2021-09-17 DIAGNOSIS — I4891 Unspecified atrial fibrillation: Secondary | ICD-10-CM | POA: Diagnosis present

## 2021-09-17 DIAGNOSIS — G4700 Insomnia, unspecified: Secondary | ICD-10-CM | POA: Diagnosis present

## 2021-09-17 DIAGNOSIS — J441 Chronic obstructive pulmonary disease with (acute) exacerbation: Secondary | ICD-10-CM | POA: Diagnosis present

## 2021-09-17 DIAGNOSIS — Z7901 Long term (current) use of anticoagulants: Secondary | ICD-10-CM | POA: Diagnosis not present

## 2021-09-17 DIAGNOSIS — I4819 Other persistent atrial fibrillation: Secondary | ICD-10-CM | POA: Diagnosis present

## 2021-09-17 DIAGNOSIS — Z8549 Personal history of malignant neoplasm of other male genital organs: Secondary | ICD-10-CM

## 2021-09-17 DIAGNOSIS — Z882 Allergy status to sulfonamides status: Secondary | ICD-10-CM

## 2021-09-17 DIAGNOSIS — F419 Anxiety disorder, unspecified: Secondary | ICD-10-CM | POA: Diagnosis present

## 2021-09-17 DIAGNOSIS — F17210 Nicotine dependence, cigarettes, uncomplicated: Secondary | ICD-10-CM | POA: Diagnosis present

## 2021-09-17 DIAGNOSIS — Z9981 Dependence on supplemental oxygen: Secondary | ICD-10-CM | POA: Diagnosis not present

## 2021-09-17 DIAGNOSIS — G4730 Sleep apnea, unspecified: Secondary | ICD-10-CM | POA: Diagnosis not present

## 2021-09-17 DIAGNOSIS — G4733 Obstructive sleep apnea (adult) (pediatric): Secondary | ICD-10-CM | POA: Diagnosis present

## 2021-09-17 DIAGNOSIS — E782 Mixed hyperlipidemia: Secondary | ICD-10-CM | POA: Diagnosis present

## 2021-09-17 DIAGNOSIS — R0603 Acute respiratory distress: Secondary | ICD-10-CM | POA: Diagnosis present

## 2021-09-17 DIAGNOSIS — Z716 Tobacco abuse counseling: Secondary | ICD-10-CM

## 2021-09-17 DIAGNOSIS — Z888 Allergy status to other drugs, medicaments and biological substances status: Secondary | ICD-10-CM

## 2021-09-17 DIAGNOSIS — J9621 Acute and chronic respiratory failure with hypoxia: Secondary | ICD-10-CM | POA: Diagnosis present

## 2021-09-17 LAB — BRAIN NATRIURETIC PEPTIDE: B Natriuretic Peptide: 161 pg/mL — ABNORMAL HIGH (ref 0.0–100.0)

## 2021-09-17 LAB — CBC WITH DIFFERENTIAL/PLATELET
Abs Immature Granulocytes: 0.02 10*3/uL (ref 0.00–0.07)
Basophils Absolute: 0.1 10*3/uL (ref 0.0–0.1)
Basophils Relative: 1 %
Eosinophils Absolute: 0.4 10*3/uL (ref 0.0–0.5)
Eosinophils Relative: 5 %
HCT: 40 % (ref 39.0–52.0)
Hemoglobin: 12.4 g/dL — ABNORMAL LOW (ref 13.0–17.0)
Immature Granulocytes: 0 %
Lymphocytes Relative: 13 %
Lymphs Abs: 1 10*3/uL (ref 0.7–4.0)
MCH: 30 pg (ref 26.0–34.0)
MCHC: 31 g/dL (ref 30.0–36.0)
MCV: 96.6 fL (ref 80.0–100.0)
Monocytes Absolute: 0.5 10*3/uL (ref 0.1–1.0)
Monocytes Relative: 7 %
Neutro Abs: 5.5 10*3/uL (ref 1.7–7.7)
Neutrophils Relative %: 74 %
Platelets: 192 10*3/uL (ref 150–400)
RBC: 4.14 MIL/uL — ABNORMAL LOW (ref 4.22–5.81)
RDW: 14.7 % (ref 11.5–15.5)
WBC: 7.5 10*3/uL (ref 4.0–10.5)
nRBC: 0 % (ref 0.0–0.2)

## 2021-09-17 LAB — COMPREHENSIVE METABOLIC PANEL
ALT: 11 U/L (ref 0–44)
AST: 17 U/L (ref 15–41)
Albumin: 3.2 g/dL — ABNORMAL LOW (ref 3.5–5.0)
Alkaline Phosphatase: 98 U/L (ref 38–126)
BUN: 16 mg/dL (ref 8–23)
CO2: >45 mmol/L — ABNORMAL HIGH (ref 22–32)
Calcium: 9.1 mg/dL (ref 8.9–10.3)
Chloride: 85 mmol/L — ABNORMAL LOW (ref 98–111)
Creatinine, Ser: 0.8 mg/dL (ref 0.61–1.24)
GFR, Estimated: >60 mL/min (ref >60)
Glucose, Bld: 109 mg/dL — ABNORMAL HIGH (ref 70–99)
Potassium: 3 mmol/L — ABNORMAL LOW (ref 3.5–5.1)
Sodium: 143 mmol/L (ref 135–145)
Total Bilirubin: 0.9 mg/dL (ref 0.3–1.2)
Total Protein: 7.1 g/dL (ref 6.5–8.1)

## 2021-09-17 LAB — PROTIME-INR
INR: 2.2 — ABNORMAL HIGH (ref 0.8–1.2)
Prothrombin Time: 24.4 seconds — ABNORMAL HIGH (ref 11.4–15.2)

## 2021-09-17 LAB — RESP PANEL BY RT-PCR (FLU A&B, COVID) ARPGX2
Influenza A by PCR: NEGATIVE
Influenza B by PCR: NEGATIVE
SARS Coronavirus 2 by RT PCR: NEGATIVE

## 2021-09-17 MED ORDER — ACETAMINOPHEN 325 MG PO TABS: 650.0000 mg | ORAL_TABLET | ORAL | Status: DC | PRN

## 2021-09-17 MED ORDER — WARFARIN SODIUM 5 MG PO TABS
5.0000 mg | ORAL_TABLET | Freq: Once | ORAL | Status: AC
  Administered 2021-09-17: 5 mg via ORAL
  Filled 2021-09-17: qty 1

## 2021-09-17 MED ORDER — ALBUTEROL SULFATE (2.5 MG/3ML) 0.083% IN NEBU
2.5000 mg/h | INHALATION_SOLUTION | RESPIRATORY_TRACT | Status: AC
  Administered 2021-09-17: 2.5 mg/h via RESPIRATORY_TRACT
  Filled 2021-09-17: qty 12

## 2021-09-17 MED ORDER — ALBUTEROL (5 MG/ML) CONTINUOUS INHALATION SOLN: 10.0000 mg/h | INHALATION_SOLUTION | Freq: Once | RESPIRATORY_TRACT | Status: DC

## 2021-09-17 MED ORDER — BUDESONIDE 0.5 MG/2ML IN SUSP
0.5000 mg | Freq: Two times a day (BID) | RESPIRATORY_TRACT | Status: DC
  Administered 2021-09-17 – 2021-09-20 (×6): 0.5 mg via RESPIRATORY_TRACT
  Filled 2021-09-17 (×6): qty 2

## 2021-09-17 MED ORDER — SODIUM CHLORIDE 0.9% FLUSH: 3.0000 mL | INTRAVENOUS | Status: DC | PRN

## 2021-09-17 MED ORDER — ONDANSETRON HCL 4 MG/2ML IJ SOLN: 4.0000 mg | Freq: Four times a day (QID) | INTRAMUSCULAR | Status: DC | PRN

## 2021-09-17 MED ORDER — CHLORHEXIDINE GLUCONATE CLOTH 2 % EX PADS
6.0000 | MEDICATED_PAD | Freq: Every day | CUTANEOUS | Status: DC
Start: 2021-09-18 — End: 2021-09-20
  Administered 2021-09-17 – 2021-09-20 (×4): 6 via TOPICAL

## 2021-09-17 MED ORDER — FUROSEMIDE 10 MG/ML IJ SOLN
80.0000 mg | Freq: Two times a day (BID) | INTRAMUSCULAR | Status: DC
  Administered 2021-09-17: 80 mg via INTRAVENOUS
  Filled 2021-09-17: qty 8

## 2021-09-17 MED ORDER — SODIUM CHLORIDE 0.9% FLUSH
3.0000 mL | Freq: Two times a day (BID) | INTRAVENOUS | Status: DC
  Administered 2021-09-17 – 2021-09-20 (×6): 3 mL via INTRAVENOUS

## 2021-09-17 MED ORDER — FUROSEMIDE 10 MG/ML IJ SOLN: 40.0000 mg | Freq: Once | INTRAMUSCULAR | Status: DC

## 2021-09-17 MED ORDER — METOPROLOL TARTRATE 50 MG PO TABS
75.0000 mg | ORAL_TABLET | Freq: Two times a day (BID) | ORAL | Status: DC
  Administered 2021-09-17 – 2021-09-20 (×6): 75 mg via ORAL
  Filled 2021-09-17 (×6): qty 2

## 2021-09-17 MED ORDER — IPRATROPIUM-ALBUTEROL 0.5-2.5 (3) MG/3ML IN SOLN
3.0000 mL | Freq: Four times a day (QID) | RESPIRATORY_TRACT | Status: DC
  Administered 2021-09-17 – 2021-09-18 (×4): 3 mL via RESPIRATORY_TRACT
  Filled 2021-09-17 (×4): qty 3

## 2021-09-17 MED ORDER — SODIUM CHLORIDE 0.9 % IV SOLN: 250.0000 mL | INTRAVENOUS | Status: DC | PRN

## 2021-09-17 MED ORDER — FUROSEMIDE 10 MG/ML IJ SOLN
80.0000 mg | Freq: Once | INTRAMUSCULAR | Status: AC
  Administered 2021-09-17: 80 mg via INTRAVENOUS
  Filled 2021-09-17: qty 8

## 2021-09-17 MED ORDER — METHYLPREDNISOLONE SODIUM SUCC 125 MG IJ SOLR
125.0000 mg | Freq: Once | INTRAMUSCULAR | Status: AC
  Administered 2021-09-17: 125 mg via INTRAVENOUS
  Filled 2021-09-17: qty 2

## 2021-09-17 MED ORDER — SIMVASTATIN 20 MG PO TABS
20.0000 mg | ORAL_TABLET | Freq: Every day | ORAL | Status: DC
  Administered 2021-09-17 – 2021-09-19 (×3): 20 mg via ORAL
  Filled 2021-09-17 (×3): qty 1

## 2021-09-17 MED ORDER — METHYLPREDNISOLONE SODIUM SUCC 125 MG IJ SOLR
60.0000 mg | Freq: Two times a day (BID) | INTRAMUSCULAR | Status: AC
  Administered 2021-09-17 – 2021-09-19 (×5): 60 mg via INTRAVENOUS
  Filled 2021-09-17 (×5): qty 2

## 2021-09-17 MED ORDER — WARFARIN - PHARMACIST DOSING INPATIENT: Freq: Every day | Status: DC

## 2021-09-17 MED ORDER — ARFORMOTEROL TARTRATE 15 MCG/2ML IN NEBU
15.0000 ug | INHALATION_SOLUTION | Freq: Two times a day (BID) | RESPIRATORY_TRACT | Status: DC
  Administered 2021-09-17 – 2021-09-20 (×6): 15 ug via RESPIRATORY_TRACT
  Filled 2021-09-17 (×6): qty 2

## 2021-09-17 NOTE — Assessment & Plan Note (Signed)
Continue statin. 

## 2021-09-17 NOTE — Assessment & Plan Note (Addendum)
Tobacco cessation discussed Nicotine patch ordered

## 2021-09-17 NOTE — Assessment & Plan Note (Signed)
Start DuoNebs Start Pulmicort Start IV Solu-Medrol

## 2021-09-17 NOTE — H&P (Signed)
History and Physical    Patient: Jesus Rodriguez TIR:[id number] DOB: Jun 16, 1950 DOA: 09/17/2021 DOS: the patient was seen and examined on 09/17/2021 [facility]CP: [facility] [place]  Patient coming from: Home  Chief Complaint:  Chief Complaint  Patient presents with   Shortness of Breath   HPI: [name] is a 71 year old male with a history of hypertension, hyperlipidemia, COPD, diastolic CHF, atrial fibrillation, rheumatoid arthritis, depression, scrotal carcinoma presenting with 3-day history of shortness of breath and increasing abdominal girth.  The patient is chronically on 3 L nasal cannula at home.  The patient also describes orthopnea type symptoms.  He states that his leg edema is about the same as usual.  He denies any fevers, chills, hemoptysis, abdominal pain, nausea, vomiting, diarrhea.  He continues to smoke 1 pack/day.  Because of his continued shortness of breath, the patient presented for further evaluation and treatment. In the ED, the patient was afebrile hemodynamically stable with oxygen saturation 95% on 6 L.  WBC 7.5, hemoglobin 12.4, platelets 192,000.  BMP showed sodium 143, potassium 3.0, bicarbonate greater than 45, serum creatinine 0.80.  EKG showed atrial fibrillation with nonspecific ST/T wave changes.  Chest x-ray showed pulmonary edema and bilateral pleural effusions.  INR was 2.0.  COVID PCR was negative.  I ordered for the patient to receive 80 mg IV Lasix.  He had received albuterol nebulizer and Solu-Medrol 125 mg IV.  Review of Systems: As mentioned in the history of present illness. All other systems reviewed and are negative. 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 He started smoking about 50 years ago. He has a 70.00 pack-year smoking history. He has never used smokeless tobacco. He reports that he does not drink alcohol and does not use drugs.  Allergies  Allergen Reactions   Celebrex [Celecoxib]    Methotrexate Derivatives Other (See Comments)    Arrhythmia    Sulfa Antibiotics     Family History  Problem Relation Age of Onset   Hypertension Father    Heart attack Father    Prostate cancer Father    Hypertension Sister    Hypertension Sister    Hypertension Sister    Epilepsy Son    Diabetes Son     Prior to Admission medications   Medication Sig Start Date End Date Taking? Authorizing Provider  acetaminophen (TYLENOL) 500 MG tablet Take 500 mg by mouth every 6 (six) hours as needed.   Yes [provider]  albuterol (VENTOLIN HFA) 108 (90 Base) MCG/ACT inhaler Inhale 1 puff into the lungs every 6 (six) hours as needed. 11/10/19  Yes [provider]  benazepril (LOTENSIN)  10 MG tablet Take 1 tablet (10 mg total) by mouth daily. 07/15/21  Yes Branch, Alphonse Guild, MD  doxycycline (VIBRAMYCIN) 100 MG capsule Take 100 mg by mouth daily. 07/02/21  Yes [provider]  ketoconazole (NIZORAL) 2 % cream Apply 1 application topically 2 (two) times daily as needed for irritation. 03/27/20  Yes [provider]  Metoprolol Tartrate 75 MG TABS TAKE 1 TABLET BY MOUTH TWICE A DAY 03/29/21  Yes Branch,  Alphonse Guild, MD  simvastatin (ZOCOR) 20 MG tablet TAKE 1 TABLET BY MOUTH DAILY AT 6 PM. 07/15/21  Yes Branch, Alphonse Guild, MD  torsemide (DEMADEX) 20 MG tablet Take 3 tablets (60 mg total) by mouth 2 (two) times daily. 09/05/21  Yes BranchAlphonse Guild, MD  TRELEGY ELLIPTA 100-62.5-25 MCG/INH AEPB Inhale 1 puff into the lungs daily. 04/02/20  Yes [provider]  triamcinolone (KENALOG) 0.1 % Apply 1 application topically 3 (three) times daily. 02/25/20  Yes [provider]  warfarin (COUMADIN) 10 MG tablet TAKE 1/2 TO 1 TABLET DAILY AS DIRECTED BY COUMADIN CLINIC. Patient taking differently: Take 5-10 mg by mouth daily. Tue thur and Sunday take '5mg'$  and mon, wed, fri, and sat take '10mg'$  01/08/21  Yes Branch, Alphonse Guild, MD  benazepril (LOTENSIN) 10 MG tablet Take 1 tablet (10 mg total) by mouth daily. Patient not taking: Reported on 09/17/2021 07/13/20   Thompson Grayer, MD    Physical Exam: Vitals:   09/17/21 0858  BP: (!) 121/54  Pulse: 76  Resp: (!) 28  Temp: (!) 97.5 F (36.4 C)  TempSrc: Oral  SpO2: (!) 76%   GENERAL:  A&O x 3, NAD, well developed, cooperative, follows commands HEENT: [place]/AT, No thrush, No icterus, No oral ulcers Neck:  No neck mass, No meningismus, soft, supple CV: IRRR, no S3, no S4, no rub, no JVD Lungs:  bilateral crackles.  Bilateral exp wheeze Abd: soft/NT +BS, nondistended Ext: 2 +LE edema, no lymphangitis, no cyanosis, no rashes   Data Reviewed: Data reviewed in history  Assessment and Plan: * Acute on chronic respiratory failure with hypoxia ([place]) Secondary to pulmonary edema and COPD exacerbation Chronically on 3 L at home Currently stable on 6 L Wean oxygen back to baseline  Mixed hyperlipidemia Continue statin  Acute on chronic diastolic CHF (congestive heart failure) (HCC) Start furosemide 80 mg IV twice daily Daily weights Accurate I's and O's Update echocardiogram 11/26/2020 echo EF 55 to 60%, trivial MR, moderate PASP  elevation  Tobacco use disorder Tobacco cessation discussed  COPD with acute exacerbation (HCC) Start DuoNebs Start Pulmicort Start IV Solu-Medrol  Obesity, Class III, BMI 40-49.9 (morbid obesity) (HCC) BMI>40 Lifestyle modification  Atrial fibrillation (HCC) Continue warfarin Continue metoprolol heart rate Currently rate controlled  Hypertension Continue metoprolol heart rate      Advance Care Planning: FULL  Consults: none  Family Communication: none  Severity of Illness: The appropriate patient status for this patient is INPATIENT. Inpatient status is judged to be reasonable and necessary in order to provide the required intensity of service to ensure the patient's safety. The patient's presenting symptoms, physical exam findings, and initial radiographic and laboratory data in the context of their chronic comorbidities is felt to place them at high risk for further clinical deterioration. Furthermore, it is not anticipated that the patient will be medically stable for discharge from the hospital within 2 midnights of admission.   * I certify that at the point of admission it is my clinical judgment that  the patient will require inpatient hospital care spanning beyond 2 midnights from the point of admission due to high intensity of service, high risk for further deterioration and high frequency of surveillance required.*  Author: Orson Eva, MD 09/17/2021 11:27 AM  For on call review [email or web address].

## 2021-09-17 NOTE — Assessment & Plan Note (Signed)
Continue warfarin Continue metoprolol heart rate Currently rate controlled

## 2021-09-17 NOTE — Assessment & Plan Note (Signed)
Continue metoprolol heart rate

## 2021-09-17 NOTE — Hospital Course (Signed)
71 year old male with a history of hypertension, hyperlipidemia, COPD, diastolic CHF, atrial fibrillation, rheumatoid arthritis, depression, scrotal carcinoma presenting with 3-day history of shortness of breath and increasing abdominal girth.  The patient is chronically on 3 L nasal cannula at home.  The patient also describes orthopnea type symptoms.  He states that his leg edema is about the same as usual.  He denies any fevers, chills, hemoptysis, abdominal pain, nausea, vomiting, diarrhea.  He continues to smoke 1 pack/day.  Because of his continued shortness of breath, the patient presented for further evaluation and treatment. In the ED, the patient was afebrile hemodynamically stable with oxygen saturation 95% on 6 L.  WBC 7.5, hemoglobin 12.4, platelets 192,000.  BMP showed sodium 143, potassium 3.0, bicarbonate greater than 45, serum creatinine 0.80.  EKG showed atrial fibrillation with nonspecific ST/T wave changes.  Chest x-ray showed pulmonary edema and bilateral pleural effusions.  INR was 2.0.  COVID PCR was negative.  I ordered for the patient to receive 80 mg IV Lasix.  He had received albuterol nebulizer and Solu-Medrol 125 mg IV.

## 2021-09-17 NOTE — ED Triage Notes (Signed)
Pt BIB [facility] with reports of SHOB x 2-3 days. Pt on 3L chronically.

## 2021-09-17 NOTE — Assessment & Plan Note (Addendum)
Initially treated with furosemide 80 mg IV twice daily and now reduced to 60 mg daily  Daily weights Accurate I's and O's Update echocardiogram 11/26/2020 echo EF 55 to 60%, trivial MR, moderate PASP elevation I/O last 3 completed shifts: In: 480 [P.O.:480] Out: 2000 [Urine:2000] Total I/O In: 3 [I.V.:3] Out: 2350 [Urine:2350]  Filed Weights   09/18/21 0417 09/19/21 0500 09/20/21 0700  Weight: (!) 172.9 kg (!) 173.4 kg (!) 173.4 kg   Pt can resume his oral torsemide 60 mg BID with potassium supplement.  He needs to take it regularly and he should do very well.  Unfortunately compliance has been a long-term issue   He already has outpatient follow up with cardiology clinic on 10/23/21 with B. Strader.

## 2021-09-17 NOTE — Progress Notes (Signed)
   09/17/21 1855  ReDS Vest / Clip  Station Marker D  Ruler Value 34  ReDS Value Range 36 - 40  ReDS Actual Value 40

## 2021-09-17 NOTE — ED Provider Notes (Signed)
[facility] EMERGENCY DEPARTMENTProvider Note   CSN: [id number] Arrival date & time: 09/17/21  1962     History  Chief Complaint  Patient presents with   Shortness of Breath    Jesus Rodriguez is a 71 y.o. male.  HPI Patient presents via EMS due to dyspnea.  Patient has multiple medical issues including COPD, wears oxygen 24/7, 3 L.  Over the past 3 days patient has had increased dyspnea without pain.  EMS reports the patient required 6 L of nasal cannula for appropriate saturation.  He was hypertensive in route, but awake, alert, not complaining of pain.    Home Medications Prior to Admission medications   Medication Sig Start Date End Date Taking? Authorizing Provider  acetaminophen (TYLENOL) 500 MG tablet Take 500 mg by mouth every 6 (six) hours as needed.    [provider]  albuterol (VENTOLIN HFA) 108 (90 Base) MCG/ACT inhaler Inhale 1 puff into the lungs every 6 (six) hours as needed. 11/10/19   [provider]  benazepril (LOTENSIN) 10 MG tablet Take 1 tablet (10 mg total) by mouth daily. 07/13/20   Allred, Jeneen Rinks, MD  benazepril (LOTENSIN) 10 MG tablet Take 1 tablet (10 mg total) by mouth daily. 07/15/21   Arnoldo Lenis, MD  ketoconazole (NIZORAL) 2 % cream Apply 1 application topically 2 (two) times daily as needed for irritation. 03/27/20   [provider]  levofloxacin (LEVAQUIN) 500 MG tablet Take 500 mg by mouth daily. Today is last day. 01/14/21   [provider]  Metoprolol Tartrate 75 MG TABS TAKE 1 TABLET BY MOUTH TWICE A DAY 03/29/21   Arnoldo Lenis, MD  simvastatin (ZOCOR) 20 MG tablet TAKE 1 TABLET BY MOUTH DAILY AT 6 PM. 07/15/21   Branch, Alphonse Guild, MD  tamsulosin (FLOMAX) 0.4 MG CAPS capsule Take by mouth daily. 07/23/17   [provider]  torsemide (DEMADEX) 20 MG tablet Take 3 tablets (60 mg total) by mouth 2 (two) times daily. 09/05/21   Arnoldo Lenis, MD  TRELEGY ELLIPTA 100-62.5-25 MCG/INH AEPB Inhale 1 puff  into the lungs daily. 04/02/20   [provider]  triamcinolone (KENALOG) 0.1 % Apply 1 application topically 3 (three) times daily. 02/25/20   [provider]  warfarin (COUMADIN) 10 MG tablet TAKE 1/2 TO 1 TABLET DAILY AS DIRECTED BY COUMADIN CLINIC. 01/08/21   Arnoldo Lenis, MD      Allergies    Celebrex [celecoxib], Methotrexate derivatives, and Sulfa antibiotics    Review of Systems   Review of Systems  All other systems reviewed and are negative.  Physical Exam Updated Vital Signs There were no vitals taken for this visit. Physical Exam Vitals and nursing note reviewed.  Constitutional:      General: He is not in acute distress.    Appearance: He is well-developed. He is obese. He is ill-appearing and diaphoretic.  HENT:     Head: Normocephalic and atraumatic.  Eyes:     Conjunctiva/sclera: Conjunctivae normal.  Cardiovascular:     Rate and Rhythm: Normal rate and regular rhythm.  Pulmonary:     Effort: Tachypnea present.     Breath sounds: Decreased breath sounds and wheezing present.  Abdominal:     General: There is no distension.  Musculoskeletal:     Right lower leg: Edema present.     Left lower leg: Edema present.  Skin:    General: Skin is warm.     Comments: Evidence for chronic venous  stasis lower extremities, no erythema.  Skin is generally purple, according to EMS, family states this is normal.  Neurological:     Mental Status: He is alert and oriented to person, place, and time.    ED Results / Procedures / Treatments   Labs (all labs ordered are listed, but only abnormal results are displayed) Labs Reviewed - No data to display  EKG None  Radiology No results found.  Procedures Procedures    Medications Ordered in ED Medications  methylPREDNISolone sodium succinate (SOLU-MEDROL) 125 mg/2 mL injection 125 mg (has no administration in time range)  albuterol (PROVENTIL,VENTOLIN) solution continuous neb (has no  administration in time range)    ED Course/ Medical Decision Making/ A&P   This patient with a Hx of COPD chronic oxygen dependency, obesity presents to the ED for concern of dyspnea, this involves an extensive number of treatment options, and is a complaint that carries with it a high risk of complications and morbidity.    The differential diagnosis includes COPD exacerbation, pneumonia, bacteremia, sepsis, CHF   Social Determinants of Health:  Obesity, home oxygen dependency  Additional history obtained:  Additional history and/or information obtained from EMS, notable for history as above   After the initial evaluation, orders, including: Labs continuous nebulizer treatment oxygen x-ray were initiated.   Patient placed on Cardiac and Pulse-Oximetry Monitors. The patient was maintained on a cardiac monitor.  The cardiac monitored showed an rhythm of 75 sinus normal The patient was also maintained on pulse oximetry. The readings were typically 98% 6 L nasal cannula.  With typical home oxygen 2 or 3 L he is 75%   On repeat evaluation of the patient improved  Lab Tests:  I personally interpreted labs.  The pertinent results include: Elevated BNP, mild hypokalemia negative COVID  Imaging Studies ordered:  I independently visualized and interpreted imaging which showed no obvious pneumonia, though there is pulmonary congestion I agree with the radiologist interpretation  Consultations Obtained:  I requested consultation with the hospitalist,  and discussed lab and imaging findings as well as pertinent plan - they recommend: [place]  Dispostion / Final MDM:  After consideration of the diagnostic results and the patient's response to treatment, this adult male, morbidly obese with COPD, CHF, home oxygen dependency presents with worsening fatigue and dyspnea, no pain.  ECG is nonischemic, patient is hypoxic on room air requiring additional supplemental oxygen, IV diuresis with  Lasix, steroids provided IV and continuous albuterol.  Patient admitted for further monitoring, management of respiratory distress, likely multifactorial, COPD and CHF without overt evidence for pneumonia.   Final Clinical Impression(s) / ED Diagnoses Final diagnoses:  Respiratory distress  CRITICAL CARE Performed by: Carmin Muskrat Total critical care time: 35 minutes Critical care time was exclusive of separately billable procedures and treating other patients. Critical care was necessary to treat or prevent imminent or life-threatening deterioration. Critical care was time spent personally by me on the following activities: development of treatment plan with patient and/or surrogate as well as nursing, discussions with consultants, evaluation of patient's response to treatment, examination of patient, obtaining history from patient or surrogate, ordering and performing treatments and interventions, ordering and review of laboratory studies, ordering and review of radiographic studies, pulse oximetry and re-evaluation of patient's condition.    Carmin Muskrat, MD 09/17/21 1050

## 2021-09-17 NOTE — Assessment & Plan Note (Signed)
BMI>40 Lifestyle modification

## 2021-09-17 NOTE — Plan of Care (Signed)
  Problem: Education: Goal: Knowledge of General Education information will improve Description Including pain rating scale, medication(s)/side effects and non-pharmacologic comfort measures Outcome: Progressing   Problem: Health Behavior/Discharge Planning: Goal: Ability to manage health-related needs will improve Outcome: Progressing   

## 2021-09-17 NOTE — Progress Notes (Signed)
1920:  Assumed care of pt at this time.   2015:  Respiratory here to give pt a respiratory treatment at this time.  Pt sat is only 80% on CPAP 15 with 6L bled in.  Spoke to charge to nurse regarding pt sat and his probable need to transfer to ICU/SD due to oxygen sats and demand for different CPAP machine.  Son at bedside and is aware of pt condition.  2025:  Notified Dr. Lazarus Salines of pt condition and received order to transfer pt to ICU/SD.  2045:  Pt to be transferred to ICU bed 3.   2100:  Pt transferred to ICU bed 3 at this time.  Belongings sent with pt.  Pt transferred on 6L N/C via bed at this time.  Pt has no c/o shortness of breath at this time.  Son with [place] for transfer to ICU. Report given to [facility] in ICU.  Respiratory in unit to see pt.

## 2021-09-17 NOTE — ED Notes (Signed)
Report given to 300 RN. 

## 2021-09-17 NOTE — Assessment & Plan Note (Addendum)
Secondary to pulmonary edema and COPD exacerbation He is much better today.  He is feeling back to baseline and wants to go home I spoke with pulmonary Dr. Halford Chessman and ok for patient to discharge home today.  Supplemental oxygen back to baseline Pt presented with very high pCO2 level and I have requested inpatient pulmonary consultation  Pt has a home CPAP and bipap but not using the bipap.  I encouraged him to use his equipment regularly.   Pt was treated with yupelri, pulmicort, brovana nebs in hospital Plan is for him to resume trelegy after discharge He will follow up with [facility].

## 2021-09-17 NOTE — Progress Notes (Signed)
ANTICOAGULATION CONSULT NOTE - Initial Consult  Pharmacy Consult for warfarin Indication: atrial fibrillation  Allergies  Allergen Reactions   Celebrex [Celecoxib]    Methotrexate Derivatives Other (See Comments)    Arrhythmia    Sulfa Antibiotics     Patient Measurements:   Heparin Dosing Weight:   Vital Signs: Temp: 97.5 F (36.4 C) (06/06 0858) Temp Source: Oral (06/06 0858) BP: 123/60 (06/06 1100) Pulse Rate: 83 (06/06 1200)  Labs: Recent Labs    09/17/21 0857 09/17/21 1230  HGB 12.4*  --   HCT 40.0  --   PLT 192  --   LABPROT  --  24.4*  INR  --  2.2*  CREATININE 0.80  --     CrCl cannot be calculated (Unknown ideal weight.).   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 INR on admission is therapeutic at 2.2 with last dosze 6/4 2100.  Home dose listed as 5 mg on Tue-Thur-Sun and 10 mg ROW  Goal of Therapy:  INR 2-3 Monitor platelets by anticoagulation protocol: Yes   Plan:  Warfarin 5 mg x 1 dose Monitor daily INR and s/s of bleeding  Margot Ables, PharmD Clinical Pharmacist 09/17/2021 12:35 PM

## 2021-09-17 NOTE — ED Notes (Signed)
Pt 76% on 3L. Pt now 93% on 6L. Md notified

## 2021-09-17 NOTE — Progress Notes (Signed)
Pt spo2 80% on CPAP 15 with 6lpm bleed in neb treatment given spo2 85% nurse inform pt needs to V60 for pressure monitoring and fio2

## 2021-09-18 ENCOUNTER — Telehealth: Payer: Self-pay | Admitting: Cardiology

## 2021-09-18 ENCOUNTER — Inpatient Hospital Stay (HOSPITAL_COMMUNITY): Payer: Medicare Other

## 2021-09-18 DIAGNOSIS — J9621 Acute and chronic respiratory failure with hypoxia: Secondary | ICD-10-CM | POA: Diagnosis not present

## 2021-09-18 DIAGNOSIS — I4891 Unspecified atrial fibrillation: Secondary | ICD-10-CM

## 2021-09-18 DIAGNOSIS — G4730 Sleep apnea, unspecified: Secondary | ICD-10-CM

## 2021-09-18 DIAGNOSIS — J441 Chronic obstructive pulmonary disease with (acute) exacerbation: Secondary | ICD-10-CM | POA: Diagnosis not present

## 2021-09-18 DIAGNOSIS — I5033 Acute on chronic diastolic (congestive) heart failure: Secondary | ICD-10-CM

## 2021-09-18 LAB — BASIC METABOLIC PANEL
BUN: 20 mg/dL (ref 8–23)
CO2: <45 mmol/L — ABNORMAL HIGH (ref 22–32)
Calcium: 8.8 mg/dL — ABNORMAL LOW (ref 8.9–10.3)
Chloride: 88 mmol/L — ABNORMAL LOW (ref 98–111)
Creatinine, Ser: 0.79 mg/dL (ref 0.61–1.24)
GFR, Estimated: >60 mL/min (ref >60)
Glucose, Bld: 154 mg/dL — ABNORMAL HIGH (ref 70–99)
Potassium: 3.3 mmol/L — ABNORMAL LOW (ref 3.5–5.1)
Sodium: 142 mmol/L (ref 135–145)

## 2021-09-18 LAB — PROTIME-INR
INR: 2.1 — ABNORMAL HIGH (ref 0.8–1.2)
Prothrombin Time: 23.5 seconds — ABNORMAL HIGH (ref 11.4–15.2)

## 2021-09-18 LAB — BLOOD GAS, ARTERIAL
Acid-Base Excess: 32.8 mmol/L — ABNORMAL HIGH (ref 0.0–2.0)
Bicarbonate: 62.1 mmol/L — ABNORMAL HIGH (ref 20.0–28.0)
Drawn by: 2407
FIO2: 50 %
O2 Saturation: 95.3 %
Patient temperature: 37.1
pCO2 arterial: 76 mmHg (ref 32–48)
pH, Arterial: 7.52 — ABNORMAL HIGH (ref 7.35–7.45)
pO2, Arterial: 61 mmHg — ABNORMAL LOW (ref 83–108)

## 2021-09-18 LAB — ECHOCARDIOGRAM COMPLETE
AR max vel: 2.59 cm2
AV Area VTI: 2.55 cm2
AV Area mean vel: 2.71 cm2
AV Mean grad: 4 mmHg
AV Peak grad: 8.5 mmHg
Ao pk vel: 1.46 m/s
Area-P 1/2: 4.26 cm2
Height: 73 in
MV VTI: 2.14 cm2
S' Lateral: 3.6 cm
Weight: 6098.81 oz

## 2021-09-18 LAB — MRSA NEXT GEN BY PCR, NASAL: MRSA by PCR Next Gen: NOT DETECTED

## 2021-09-18 MED ORDER — ORAL CARE MOUTH RINSE
15.0000 mL | Freq: Two times a day (BID) | OROMUCOSAL | Status: DC
  Administered 2021-09-18 – 2021-09-20 (×5): 15 mL via OROMUCOSAL

## 2021-09-18 MED ORDER — LIVING BETTER WITH HEART FAILURE BOOK
Freq: Once | Status: AC
  Administered 2021-09-20: 1

## 2021-09-18 MED ORDER — ACETAZOLAMIDE 250 MG PO TABS
250.0000 mg | ORAL_TABLET | Freq: Two times a day (BID) | ORAL | Status: AC
  Administered 2021-09-19: 250 mg via ORAL
  Filled 2021-09-18 (×7): qty 1

## 2021-09-18 MED ORDER — WARFARIN SODIUM 5 MG PO TABS
10.0000 mg | ORAL_TABLET | Freq: Once | ORAL | Status: AC
  Administered 2021-09-18: 10 mg via ORAL
  Filled 2021-09-18: qty 2

## 2021-09-18 MED ORDER — FUROSEMIDE 10 MG/ML IJ SOLN
60.0000 mg | Freq: Two times a day (BID) | INTRAMUSCULAR | Status: DC
  Administered 2021-09-18: 60 mg via INTRAVENOUS
  Filled 2021-09-18: qty 6

## 2021-09-18 MED ORDER — FUROSEMIDE 10 MG/ML IJ SOLN
60.0000 mg | Freq: Every day | INTRAMUSCULAR | Status: DC
  Administered 2021-09-19 – 2021-09-20 (×2): 60 mg via INTRAVENOUS
  Filled 2021-09-18 (×2): qty 6

## 2021-09-18 MED ORDER — CHLORHEXIDINE GLUCONATE 0.12 % MT SOLN
15.0000 mL | Freq: Two times a day (BID) | OROMUCOSAL | Status: DC
  Administered 2021-09-18 – 2021-09-20 (×2): 15 mL via OROMUCOSAL
  Filled 2021-09-18 (×2): qty 15

## 2021-09-18 MED ORDER — ALBUTEROL SULFATE (2.5 MG/3ML) 0.083% IN NEBU: 2.5000 mg | INHALATION_SOLUTION | RESPIRATORY_TRACT | Status: DC | PRN

## 2021-09-18 MED ORDER — POTASSIUM CHLORIDE CRYS ER 20 MEQ PO TBCR
30.0000 meq | EXTENDED_RELEASE_TABLET | Freq: Two times a day (BID) | ORAL | Status: DC
  Administered 2021-09-18 – 2021-09-20 (×5): 30 meq via ORAL
  Filled 2021-09-18 (×5): qty 1

## 2021-09-18 MED ORDER — REVEFENACIN 175 MCG/3ML IN SOLN
175.0000 ug | Freq: Every day | RESPIRATORY_TRACT | Status: DC
  Administered 2021-09-19 – 2021-09-20 (×2): 175 ug via RESPIRATORY_TRACT
  Filled 2021-09-18 (×2): qty 3

## 2021-09-18 NOTE — Progress Notes (Signed)
ANTICOAGULATION CONSULT NOTE - Initial Consult  Pharmacy Consult for warfarin Indication: atrial fibrillation  Allergies  Allergen Reactions   Celebrex [Celecoxib]    Methotrexate Derivatives Other (See Comments)    Arrhythmia    Sulfa Antibiotics     Patient Measurements: Height: '6\' 1"'$  (185.4 cm) Weight: (!) 172.9 kg (381 lb 2.8 oz) IBW/kg (Calculated) : 79.9 Heparin Dosing Weight:   Vital Signs: Temp: 98.8 F (37.1 C) (06/07 0700) Temp Source: Axillary (06/07 0700) BP: 113/76 (06/07 0700) Pulse Rate: 97 (06/07 0800)  Labs: Recent Labs    09/17/21 0857 09/17/21 1230 09/18/21 0337  HGB 12.4*  --   --   HCT 40.0  --   --   PLT 192  --   --   LABPROT  --  24.4* 23.5*  INR  --  2.2* 2.1*  CREATININE 0.80  --  0.79     Estimated Creatinine Clearance: 140.3 mL/min (by C-G formula based on SCr of 0.79 mg/dL).   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   unk   albuterol (VENTOLIN HFA) 108 (90 Base) MCG/ACT inhaler Inhale 1 puff into the lungs every 6 (six) hours as needed.   unk   benazepril (LOTENSIN) 10 MG tablet Take 1 tablet (10 mg total) by mouth daily. 90 tablet 3 09/16/2021   doxycycline (VIBRAMYCIN) 100 MG capsule Take 100 mg by mouth daily.   09/16/2021   ketoconazole (NIZORAL) 2 % cream Apply 1 application topically 2  (two) times daily as needed for irritation.   09/16/2021   Metoprolol Tartrate 75 MG TABS TAKE 1 TABLET BY MOUTH TWICE A DAY 180 tablet 2 09/17/2021 at 0430   simvastatin (ZOCOR) 20 MG tablet TAKE 1 TABLET BY MOUTH DAILY AT 6 PM. 90 tablet 1 09/16/2021   torsemide (DEMADEX) 20 MG tablet Take 3 tablets (60 mg total) by mouth 2 (two) times daily. 540 tablet 0 09/16/2021   TRELEGY ELLIPTA 100-62.5-25 MCG/INH AEPB Inhale 1 puff into the lungs daily.   09/16/2021   triamcinolone (KENALOG) 0.1 % Apply 1 application topically 3 (three) times daily.   09/16/2021   warfarin (COUMADIN) 10 MG tablet TAKE 1/2 TO 1 TABLET DAILY AS DIRECTED BY COUMADIN CLINIC. (Patient taking differently: Take 5-10 mg by mouth daily. Tue thur and Sunday take '5mg'$  and mon, wed, fri, and sat take '10mg'$ ) 90 tablet 3 09/15/2021 at 2100   benazepril (LOTENSIN) 10 MG tablet Take 1 tablet (10 mg total) by mouth daily. (Patient not taking: Reported on 09/17/2021) 30 tablet 6 Not Taking    Assessment: Pharmacy consulted to dose warfarin in patient with atrial fibrillation. INR on admission is therapeutic at 2.2 with last dosze 6/4 2100.  Home dose listed as 5 mg on Tue-Thur-Sun and 10 mg ROW  Goal of Therapy:  INR 2-3  Monitor platelets by anticoagulation protocol: Yes   Plan:  Warfarin 10 mg x 1 dose Monitor daily INR and s/s of bleeding  Thomasenia Sales, PharmD, [facility] Clinical Pharmacist  09/18/2021 8:46 AM

## 2021-09-18 NOTE — TOC Progression Note (Signed)
   Transition of Care [facility]) Screening Note   Patient Details  Name: Jesus Rodriguez Date of Birth: 1950-09-08   Transition of Care [facility]) CM/SW Contact:    Boneta Lucks, RN Phone Number: 09/18/2021, 3:06 PM  CHF consult, patient is not THN, TOC will offer home health RN for education. CHF book ordered.   Transition of Care Department [facility]) has reviewed patient and no TOC needs have been identified at this time. We will continue to monitor patient advancement through interdisciplinary progression rounds. If new patient transition needs arise, please place a TOC consult.    Expected Discharge Plan: [place] Barriers to Discharge: Continued Medical Work up  Expected Discharge Plan and Services Expected Discharge Plan: [place]

## 2021-09-18 NOTE — Telephone Encounter (Signed)
Caller just calling to let office know that Pt is in hospital currently and he went ahead and cancelled in office appt for tomorrow.

## 2021-09-18 NOTE — Progress Notes (Signed)
Patient taken off BIPAP and placed on 6L HFNC. Tolerating well at this time.  RT will continue to monitor.

## 2021-09-18 NOTE — Consult Note (Signed)
[facility]   Patient name: Jesus Rodriguez Admit date: 09/17/2021  DOB: [birth year] LOS: 1  MRN: [id number] Consult date: 09/18/2021  Referring provider: Dr. Wynetta Emery, [facility]: Dyspnea    History:  71 yo male smoker presented to [facility] ER with worsening dyspnea, bloating for 3 days.  Needing 6 liters oxygen in ER.  Started on therapy for COPD exacerbation and CHF exacerbation.  Found to have hypercapnia with hypoxia and started on Bipap.  PCCM consulted to assist with respiratory management.  Past medical history:  HTN, HLD, COPD, Chronic respiratory failure on 3 liters, Diastolic CHF,  A fib, RA, Depression, Scrotal cancer, Anxiety, OSA  Significant events:  6/06 Admit 6/07 start on Bipap  Studies:  Echo 09/18/21 >> EF 55 to 60%, mild LVH, aortic root 40 mm  Micro:  COVID/Flu 6/06 >> negative  Lines:     Antibiotics:    Consults:      Interim history:  He is a English as a second language teacher.  Gets primary care now through [facility], but difficult to commute.  He was being followed by pulmonary in [facility] for COPD and sleep apnea.  He has both CPAP and Bipap at home, but prefers his old CPAP.  He would like to switch pulmonary care to [facility].  He has trouble laying flat and usually sleeps sitting up.  He has cough and wheeze still.  Leg swelling better.  He says that he will stop smoking, and asked his daughter to sell all of his cigarettes at home.  Vital signs:  BP 128/63   Pulse 90   Temp 98.1 F (36.7 C) (Oral)   Resp (!) 30   Ht '6\' 1"'$  (1.854 m)   Wt (!) 172.9 kg   SpO2 92%   BMI 50.29 kg/m   Intake/output:  I/O last 3 completed shifts: In: 240 [P.O.:240] Out: 650 [Urine:650]   Physical exam:   General - alert Eyes - pupils reactive ENT - no sinus tenderness, no stridor Cardiac - regular rate/rhythm, no murmur Chest - faint b/l wheezing Abdomen - soft, non tender, + bowel sounds Extremities - 1+ edema Skin - venous  stasis changes Neuro - normal strength, moves extremities, follows commands Psych - normal mood and behavior  Best practice:   DVT - Coumadin SUP - N/A Nutrition - Heart healthy   Assessment/plan:   COPD exacerbation. - yupelri, brovana, pulmicort for now; eventual plan to transition back to trelegy - continue solumedrol 60 mg q12h - prn albuterol  Sleep disordered breathing with acute on chronic hypoxic/hypercapnic respiratory failure. - goal SpO2 > 90% - Bipap qhs and prn  Metabolic alkalosis in setting of respiratory acidosis. - might also have component of contraction alkalosis from recent increase in diuresis - add diamox - change lasix to 60 mg IV daily  Tobacco abuse. - smoking cessation  Acute on chronic diastolic CHF. Atrial fibrillation. - per primary team - will need new outpt PCP in [facility] or [place]  Resolved hospital problems:    Goals of care/Family discussions:  Code status: full  Labs:      Latest Ref Rng & Units 09/18/2021    3:37 AM 09/17/2021    8:57 AM 11/26/2020    4:22 AM  CMP  Glucose 70 - 99 mg/dL 154   109   150    BUN 8 - 23 mg/dL '20   16   29    '$ Creatinine 0.61 - 1.24 mg/dL 0.79   0.80  1.09    Sodium 135 - 145 mmol/L 142   143   134    Potassium 3.5 - 5.1 mmol/L 3.3   3.0   3.9    Chloride 98 - 111 mmol/L 88   85   97    CO2 22 - 32 mmol/L <45   >45   28    Calcium 8.9 - 10.3 mg/dL 8.8   9.1   7.8    Total Protein 6.5 - 8.1 g/dL  7.1   6.1    Total Bilirubin 0.3 - 1.2 mg/dL  0.9   1.1    Alkaline Phos 38 - 126 U/L  98   59    AST 15 - 41 U/L  17   17    ALT 0 - 44 U/L  11   13         Latest Ref Rng & Units 09/17/2021    8:57 AM 11/26/2020    4:22 AM 11/25/2020    8:22 AM  CBC  WBC 4.0 - 10.5 K/uL 7.5   7.1   7.2    Hemoglobin 13.0 - 17.0 g/dL 12.4   12.5   12.0    Hematocrit 39.0 - 52.0 % 40.0   39.3   37.6    Platelets 150 - 400 K/uL 192   253   175      ABG    Component Value Date/Time   PHART 7.52 (H) 09/18/2021  0835   PCO2ART 76 (HH) 09/18/2021 0835   PO2ART 61 (L) 09/18/2021 0835   HCO3 62.1 (H) 09/18/2021 0835   O2SAT 95.3 09/18/2021 0835    CBG (last 3)  No results for input(s): GLUCAP in the last 72 hours.   Past surgical history:  He  has a past surgical history that includes A-FLUTTER ABLATION; Cardioversion (02/10/2018); Cataract extraction; and Cardioversion (N/A, 05/13/2018).  Social history:  He  reports that he has been smoking cigarettes. He started smoking about 50 years ago. He has a 70.00 pack-year smoking history. He has never used smokeless tobacco. He reports that he does not drink alcohol and does not use drugs.   Review of systems:  Reviewed and negative  Family history:  His family history includes Diabetes in his son; Epilepsy in his son; Heart attack in his father; Hypertension in his father, sister, sister, and sister; Prostate cancer in his father.    Medications:   No current facility-administered medications on file prior to encounter.   Current Outpatient Medications on File Prior to Encounter  Medication Sig   acetaminophen (TYLENOL) 500 MG tablet Take 500 mg by mouth every 6 (six) hours as needed.   albuterol (VENTOLIN HFA) 108 (90 Base) MCG/ACT inhaler Inhale 1 puff into the lungs every 6 (six) hours as needed.   benazepril (LOTENSIN) 10 MG tablet Take 1 tablet (10 mg total) by mouth daily.   doxycycline (VIBRAMYCIN) 100 MG capsule Take 100 mg by mouth daily.   ketoconazole (NIZORAL) 2 % cream Apply 1 application topically 2 (two) times daily as needed for irritation.   Metoprolol Tartrate 75 MG TABS TAKE 1 TABLET BY MOUTH TWICE A DAY   simvastatin (ZOCOR) 20 MG tablet TAKE 1 TABLET BY MOUTH DAILY AT 6 PM.   torsemide (DEMADEX) 20 MG tablet Take 3 tablets (60 mg total) by mouth 2 (two) times daily.   TRELEGY ELLIPTA 100-62.5-25 MCG/INH AEPB Inhale 1 puff into the lungs daily.   triamcinolone (KENALOG) 0.1 % Apply  1 application topically 3 (three) times  daily.   warfarin (COUMADIN) 10 MG tablet TAKE 1/2 TO 1 TABLET DAILY AS DIRECTED BY COUMADIN CLINIC. (Patient taking differently: Take 5-10 mg by mouth daily. Tue thur and Sunday take '5mg'$  and mon, wed, fri, and sat take '10mg'$ )   benazepril (LOTENSIN) 10 MG tablet Take 1 tablet (10 mg total) by mouth daily. (Patient not taking: Reported on 09/17/2021)     Signature:  Chesley Mires, MD [place] Pager - [phone] 09/18/2021, 1:43 PM

## 2021-09-18 NOTE — Progress Notes (Signed)
Fio2 increased to 50% by nurse due to spo2 dropped to 84% and staying

## 2021-09-18 NOTE — Progress Notes (Incomplete)
*  PRELIMINARY RESULTS* Echocardiogram 2D Echocardiogram has been performed.  Jesus Rodriguez 09/18/2021, 10:36 AM

## 2021-09-18 NOTE — Progress Notes (Signed)
PROGRESS NOTE   Jesus Rodriguez  KNL:[id number] DOB: 1951/02/21 DOA: 09/17/2021 PCP: [facility]   Chief Complaint  Patient presents with   Shortness of Breath   Level of care: Stepdown  Brief Admission History:  71 year old male with a history of hypertension, hyperlipidemia, COPD, diastolic CHF, atrial fibrillation, rheumatoid arthritis, depression, scrotal carcinoma presenting with 3-day history of shortness of breath and increasing abdominal girth.  The patient is chronically on 3 L nasal cannula at home.  The patient also describes orthopnea type symptoms.  He states that his leg edema is about the same as usual.  He denies any fevers, chills, hemoptysis, abdominal pain, nausea, vomiting, diarrhea.  He continues to smoke 1 pack/day.  Because of his continued shortness of breath, the patient presented for further evaluation and treatment.  In the ED, the patient was afebrile hemodynamically stable with oxygen saturation 95% on 6 L.  WBC 7.5, hemoglobin 12.4, platelets 192,000.  BMP showed sodium 143, potassium 3.0, bicarbonate greater than 45, serum creatinine 0.80.  EKG showed atrial fibrillation with nonspecific ST/T wave changes.  Chest x-ray showed pulmonary edema and bilateral pleural effusions.  INR was 2.0.  COVID PCR was negative.  I ordered for the patient to receive 80 mg IV Lasix.  He had received albuterol nebulizer and Solu-Medrol 125 mg IV.   Assessment and Plan: * Acute on chronic respiratory failure with hypoxia and hypercarbia Secondary to pulmonary edema and COPD exacerbation Chronically on 3 L at home Currently stable on 6 L Wean oxygen back to baseline He has very high pCO2 level and I have requested inpatient pulmonary consultation   Mixed hyperlipidemia Continue statin  Acute on chronic diastolic CHF (congestive heart failure) ([place]) Initially treated with furosemide 80 mg IV twice daily and now reduced to 60 mg daily  Daily weights Accurate I's and  O's Update echocardiogram 11/26/2020 echo EF 55 to 60%, trivial MR, moderate PASP elevation I/O last 3 completed shifts: In: 240 [P.O.:240] Out: 650 [Urine:650] Total I/O In: 360 [P.O.:360] Out: 800 [Urine:800]  Filed Weights   09/17/21 1319 09/18/21 0417  Weight: (!) 175.1 kg (!) 172.9 kg      Tobacco use disorder Tobacco cessation discussed  COPD with acute exacerbation (HCC) Continue pulmicort, brovana, yupelri nebs per pulmonology team  Continue IV Solu-Medrol  Obesity, Class III, BMI 40-49.9 (morbid obesity) (HCC) BMI>40 Lifestyle modification  Atrial fibrillation (HCC) Continue warfarin Continue metoprolol heart rate Currently rate controlled  Hypertension Continue metoprolol for blood pressure management.   DVT prophylaxis: warfarin  Code Status: full  Family Communication:  Disposition: Status is: Inpatient Remains inpatient appropriate because: he still requires care in stepdown ICU    Consultants:  PCCM  Procedures:  Bipap  Antimicrobials:  levo >>   Subjective: Pt reports cough and chest congestion, white sputum production.  Objective: Vitals:   09/18/21 1158 09/18/21 1200 09/18/21 1300 09/18/21 1400  BP:  (!) 110/41 (!) 107/53 (!) 125/57  Pulse: 90 78 88 86  Resp: (!) 32 (!) 32 (!) 29 (!) 24  Temp:      TempSrc:      SpO2: 93% 91% (!) 88% 95%  Weight:      Height:        Intake/Output Summary (Last 24 hours) at 09/18/2021 1606 Last data filed at 09/18/2021 1200 Gross per 24 hour  Intake 600 ml  Output 1450 ml  Net -850 ml   Filed Weights   09/17/21 1319 09/18/21 0417  Weight: Marland Kitchen)  175.1 kg (!) 172.9 kg   Examination:  General exam: Appears chronically ill, he appears older than stated age, he is on bipap, seems to be mentating ok right now.  Respiratory system: diffuse expiratory wheezing and bilateral rales heard.  Cardiovascular system: normal S1 & S2 heard. No JVD, murmurs, rubs, gallops or clicks. No pedal  edema. Gastrointestinal system: Abdomen is nondistended, soft and nontender. No organomegaly or masses felt. Normal bowel sounds heard. Central nervous system: Alert and oriented. No focal neurological deficits. Extremities: Symmetric 5 x 5 power. Skin: No rashes, lesions or ulcers. Psychiatry: Judgement and insight appear poor. Mood & affect appropriate.   Data Reviewed: I have personally reviewed following labs and imaging studies  CBC: Recent Labs  Lab 09/17/21 0857  WBC 7.5  NEUTROABS 5.5  HGB 12.4*  HCT 40.0  MCV 96.6  PLT 742    Basic Metabolic Panel: Recent Labs  Lab 09/17/21 0857 09/18/21 0337  NA 143 142  K 3.0* 3.3*  CL 85* 88*  CO2 >45* <45*  GLUCOSE 109* 154*  BUN 16 20  CREATININE 0.80 0.79  CALCIUM 9.1 8.8*    CBG: No results for input(s): GLUCAP in the last 168 hours.  Recent Results (from the past 240 hour(s))  Resp Panel by RT-PCR (Flu A&B, Covid) Anterior Nasal Swab     Status: None   Collection Time: 09/17/21  8:57 AM   Specimen: Anterior Nasal Swab  Result Value Ref Range Status   SARS Coronavirus 2 by RT PCR NEGATIVE NEGATIVE Final    Comment: (NOTE) SARS-CoV-2 target nucleic acids are NOT DETECTED.  The SARS-CoV-2 RNA is generally detectable in upper respiratory specimens during the acute phase of infection. The lowest concentration of SARS-CoV-2 viral copies this assay can detect is 138 copies/mL. A negative result does not preclude SARS-Cov-2 infection and should not be used as the sole basis for treatment or other patient management decisions. A negative result may occur with  improper specimen collection/handling, submission of specimen other than nasopharyngeal swab, presence of viral mutation(s) within the areas targeted by this assay, and inadequate number of viral copies(<138 copies/mL). A negative result must be combined with clinical observations, patient history, and epidemiological information. The expected result is  Negative.  Fact Sheet for Patients:  EntrepreneurPulse.com.au  Fact Sheet for Healthcare Providers:  IncredibleEmployment.be  This test is no t yet approved or cleared by the [place] FDA and  has been authorized for detection and/or diagnosis of SARS-CoV-2 by FDA under an Emergency Use Authorization (EUA). This EUA will remain  in effect (meaning this test can be used) for the duration of the COVID-19 declaration under Section 564(b)(1) of the Act, 21 U.S.C.section 360bbb-3(b)(1), unless the authorization is terminated  or revoked sooner.       Influenza A by PCR NEGATIVE NEGATIVE Final   Influenza B by PCR NEGATIVE NEGATIVE Final    Comment: (NOTE) The Xpert Xpress SARS-CoV-2/FLU/RSV plus assay is intended as an aid in the diagnosis of influenza from Nasopharyngeal swab specimens and should not be used as a sole basis for treatment. Nasal washings and aspirates are unacceptable for Xpert Xpress SARS-CoV-2/FLU/RSV testing.  Fact Sheet for Patients: EntrepreneurPulse.com.au  Fact Sheet for Healthcare Providers: IncredibleEmployment.be  This test is not yet approved or cleared by the [place] FDA and has been authorized for detection and/or diagnosis of SARS-CoV-2 by FDA under an Emergency Use Authorization (EUA). This EUA will remain in effect (meaning this test can be used) for  the duration of the COVID-19 declaration under Section 564(b)(1) of the Act, 21 U.S.C. section 360bbb-3(b)(1), unless the authorization is terminated or revoked.  Performed at [facility], [address], [place], [place] [address]   MRSA Next Gen by PCR, Nasal     Status: None   Collection Time: 09/18/21 12:56 PM   Specimen: Nasal Mucosa; Nasal Swab  Result Value Ref Range Status   MRSA by PCR Next Gen NOT DETECTED NOT DETECTED Final    Comment: (NOTE) The GeneXpert MRSA Assay (FDA approved for NASAL specimens  only), is one component of a comprehensive MRSA colonization surveillance program. It is not intended to diagnose MRSA infection nor to guide or monitor treatment for MRSA infections. Test performance is not FDA approved in patients less than 34 years old. Performed at [facility], [address] [place], [place] [address]      Radiology Studies: [facility] Chest [facility] 1 View  Result Date: 09/17/2021 CLINICAL DATA:  Shortness of breath EXAM: PORTABLE CHEST 1 VIEW COMPARISON:  01/12/2021 FINDINGS: Stable cardiomegaly. Low lung volumes. Layering bilateral pleural effusions with associated bibasilar opacities. Additional bilateral perihilar opacities. Diffuse interstitial prominence. No pneumothorax. IMPRESSION: Findings suggestive of CHF with pulmonary edema and layering bilateral pleural effusions. Superimposed infectious process would be difficult to exclude. Electronically Signed   By: Davina Poke D.O.   On: 09/17/2021 09:19   ECHOCARDIOGRAM COMPLETE  Result Date: 09/18/2021    ECHOCARDIOGRAM REPORT   Patient Name:   COBIN CADAVID Date of Exam: 09/18/2021 Medical Rec #:  [id number]      Height:       73.0 in Accession #:    [id number]     Weight:       381.2 lb Date of Birth:  09/07/1950      BSA:          2.830 m Patient Age:    79 years       BP:           97/48 mmHg Patient Gender: M              HR:           80 bpm. Exam Location:  [facility] Procedure: 2D Echo, Cardiac Doppler and Color Doppler Indications:    CHF  History:        Patient has prior history of Echocardiogram examinations, most                 recent 11/26/2020. CHF, COPD, Arrythmias:Atrial Fibrillation;                 Risk Factors:Hypertension, Dyslipidemia and Current Smoker.                 Patient is sitting for the echo due to SOB and is continually                 coughing.  Sonographer:    Wenda Low Referring Phys: [phone] DAVID TAT  Sonographer Comments: Patient is morbidly obese. Image acquisition challenging due to COPD  and Image acquisition challenging due to respiratory motion. IMPRESSIONS  1. Abnormal septal motion . Left ventricular ejection fraction, by estimation, is 55 to 60%. The left ventricle has normal function. The left ventricle has no regional wall motion abnormalities. There is mild left ventricular hypertrophy. Left ventricular diastolic parameters are indeterminate.  2. Right ventricular systolic function is normal. The right ventricular size is normal. There is normal pulmonary artery systolic pressure.  3. Right atrial size was mildly dilated.  4. The mitral valve is abnormal. Trivial mitral valve regurgitation. No evidence of mitral stenosis.  5. The aortic valve is tricuspid. There is moderate calcification of the aortic valve. There is moderate thickening of the aortic valve. Aortic valve regurgitation is not visualized. Aortic valve sclerosis/calcification is present, without any evidence of aortic stenosis.  6. Aortic dilatation noted. There is mild dilatation of the aortic root, measuring 40 mm.  7. The inferior vena cava is dilated in size with >50% respiratory variability, suggesting right atrial pressure of 8 mmHg. FINDINGS  Left Ventricle: Abnormal septal motion. Left ventricular ejection fraction, by estimation, is 55 to 60%. The left ventricle has normal function. The left ventricle has no regional wall motion abnormalities. The left ventricular internal cavity size was normal in size. There is mild left ventricular hypertrophy. Left ventricular diastolic parameters are indeterminate. Right Ventricle: The right ventricular size is normal. No increase in right ventricular wall thickness. Right ventricular systolic function is normal. There is normal pulmonary artery systolic pressure. The tricuspid regurgitant velocity is 2.04 m/s, and  with an assumed right atrial pressure of 8 mmHg, the estimated right ventricular systolic pressure is 40.9 mmHg. Left Atrium: Left atrial size was normal in size. Right  Atrium: Right atrial size was mildly dilated. Pericardium: There is no evidence of pericardial effusion. Mitral Valve: The mitral valve is abnormal. There is mild thickening of the mitral valve leaflet(s). There is moderate calcification of the mitral valve leaflet(s). Trivial mitral valve regurgitation. No evidence of mitral valve stenosis. MV peak gradient, 9.5 mmHg. The mean mitral valve gradient is 5.0 mmHg. Tricuspid Valve: The tricuspid valve is normal in structure. Tricuspid valve regurgitation is trivial. No evidence of tricuspid stenosis. Aortic Valve: The aortic valve is tricuspid. There is moderate calcification of the aortic valve. There is moderate thickening of the aortic valve. Aortic valve regurgitation is not visualized. Aortic valve sclerosis/calcification is present, without any  evidence of aortic stenosis. Aortic valve mean gradient measures 4.0 mmHg. Aortic valve peak gradient measures 8.5 mmHg. Aortic valve area, by VTI measures 2.55 cm. Pulmonic Valve: The pulmonic valve was normal in structure. Pulmonic valve regurgitation is not visualized. No evidence of pulmonic stenosis. Aorta: Aortic dilatation noted. There is mild dilatation of the aortic root, measuring 40 mm. Venous: The inferior vena cava is dilated in size with greater than 50% respiratory variability, suggesting right atrial pressure of 8 mmHg. IAS/Shunts: No atrial level shunt detected by color flow Doppler.  LEFT VENTRICLE PLAX 2D LVIDd:         5.90 cm   Diastology LVIDs:         3.60 cm   LV e' medial:    9.90 cm/s LV PW:         1.20 cm   LV E/e' medial:  10.2 LV IVS:        1.30 cm   LV e' lateral:   14.10 cm/s LVOT diam:     2.10 cm   LV E/e' lateral: 7.2 LV SV:         71 LV SV Index:   25 LVOT Area:     3.46 cm  RIGHT VENTRICLE RV Basal diam:  4.30 cm RV Mid diam:    4.50 cm TAPSE (M-mode): 3.2 cm LEFT ATRIUM            Index        RIGHT ATRIUM  Index LA diam:      4.40 cm  1.55 cm/m   RA Area:     31.90 cm  LA Vol (A4C): 123.0 ml 43.46 ml/m  RA Volume:   119.00 ml 42.05 ml/m  AORTIC VALVE                    PULMONIC VALVE AV Area (Vmax):    2.59 cm     PV Vmax:       0.75 m/s AV Area (Vmean):   2.71 cm     PV Peak grad:  2.3 mmHg AV Area (VTI):     2.55 cm AV Vmax:           146.00 cm/s AV Vmean:          90.500 cm/s AV VTI:            0.280 m AV Peak Grad:      8.5 mmHg AV Mean Grad:      4.0 mmHg LVOT Vmax:         109.00 cm/s LVOT Vmean:        70.900 cm/s LVOT VTI:          0.206 m LVOT/AV VTI ratio: 0.74  AORTA Ao Root diam: 4.00 cm Ao Asc diam:  3.60 cm MITRAL VALVE                TRICUSPID VALVE MV Area (PHT): 4.26 cm     TR Peak grad:   16.6 mmHg MV Area VTI:   2.14 cm     TR Vmax:        204.00 cm/s MV Peak grad:  9.5 mmHg MV Mean grad:  5.0 mmHg     SHUNTS MV Vmax:       1.54 m/s     Systemic VTI:  0.21 m MV Vmean:      96.1 cm/s    Systemic Diam: 2.10 cm MV Decel Time: 178 msec MV E velocity: 101.00 cm/s Jenkins Rouge MD Electronically signed by Jenkins Rouge MD Signature Date/Time: 09/18/2021/11:40:06 AM    Final     Scheduled Meds:  acetaZOLAMIDE  250 mg Oral BID   arformoterol  15 mcg Nebulization BID   budesonide (PULMICORT) nebulizer solution  0.5 mg Nebulization BID   chlorhexidine  15 mL Mouth Rinse BID   Chlorhexidine Gluconate Cloth  6 each Topical Daily   [START ON 09/19/2021] furosemide  60 mg Intravenous Daily   Living Better with Heart Failure Book   Does not apply Once   mouth rinse  15 mL Mouth Rinse q12n4p   methylPREDNISolone (SOLU-MEDROL) injection  60 mg Intravenous Q12H   metoprolol tartrate  75 mg Oral BID   potassium chloride  30 mEq Oral BID   revefenacin  175 mcg Nebulization Daily   simvastatin  20 mg Oral q1800   sodium chloride flush  3 mL Intravenous Q12H   warfarin  10 mg Oral ONCE-1600   Warfarin - Pharmacist Dosing Inpatient   Does not apply q1600   Continuous Infusions:  sodium chloride       LOS: 1 day  Critical Care Procedure Note Authorized and  Performed by: Murvin Natal MD  Total Critical Care time:  45 mins Due to a high probability of clinically significant, life threatening deterioration, the patient required my highest level of preparedness to intervene emergently and I personally spent this critical care time directly and personally managing the patient.  This critical  care time included obtaining a history; examining the patient, pulse oximetry; ordering and review of studies; arranging urgent treatment with development of a management plan; evaluation of patient's response of treatment; frequent reassessment; and discussions with other providers.  This critical care time was performed to assess and manage the high probability of imminent and life threatening deterioration that could result in multi-organ failure.  It was exclusive of separately billable procedures and treating other patients and teaching time.    Irwin Brakeman, MD How to contact the [facility] Attending or Consulting provider [place] or covering provider during after hours [place], for this patient?  Check the care team in [facility] and look for a) attending/consulting [facility] provider listed and b) the [facility] team listed Log into [email or web address] and use [place]'s universal password to access. If you do not have the password, please contact the hospital operator. Locate the [facility] provider you are looking for under [facility] and page to a number that you can be directly reached. If you still have difficulty reaching the provider, please page the [facility] (Director on Call) for the Hospitalists listed on amion for assistance.  09/18/2021, 4:06 PM

## 2021-09-18 NOTE — Telephone Encounter (Signed)
noted 

## 2021-09-18 NOTE — Plan of Care (Signed)

## 2021-09-19 ENCOUNTER — Ambulatory Visit: Payer: Medicare Other | Admitting: Cardiology

## 2021-09-19 DIAGNOSIS — J9621 Acute and chronic respiratory failure with hypoxia: Secondary | ICD-10-CM | POA: Diagnosis not present

## 2021-09-19 DIAGNOSIS — F17200 Nicotine dependence, unspecified, uncomplicated: Secondary | ICD-10-CM

## 2021-09-19 DIAGNOSIS — I5033 Acute on chronic diastolic (congestive) heart failure: Secondary | ICD-10-CM | POA: Diagnosis not present

## 2021-09-19 DIAGNOSIS — J441 Chronic obstructive pulmonary disease with (acute) exacerbation: Secondary | ICD-10-CM | POA: Diagnosis not present

## 2021-09-19 DIAGNOSIS — I4891 Unspecified atrial fibrillation: Secondary | ICD-10-CM | POA: Diagnosis not present

## 2021-09-19 DIAGNOSIS — I10 Essential (primary) hypertension: Secondary | ICD-10-CM

## 2021-09-19 DIAGNOSIS — E782 Mixed hyperlipidemia: Secondary | ICD-10-CM

## 2021-09-19 LAB — CBC
HCT: 40.2 % (ref 39.0–52.0)
Hemoglobin: 12.3 g/dL — ABNORMAL LOW (ref 13.0–17.0)
MCH: 29.7 pg (ref 26.0–34.0)
MCHC: 30.6 g/dL (ref 30.0–36.0)
MCV: 97.1 fL (ref 80.0–100.0)
Platelets: 202 10*3/uL (ref 150–400)
RBC: 4.14 MIL/uL — ABNORMAL LOW (ref 4.22–5.81)
RDW: 14.4 % (ref 11.5–15.5)
WBC: 8.5 10*3/uL (ref 4.0–10.5)
nRBC: 0 % (ref 0.0–0.2)

## 2021-09-19 LAB — PROTIME-INR
INR: 1.7 — ABNORMAL HIGH (ref 0.8–1.2)
Prothrombin Time: 20.1 seconds — ABNORMAL HIGH (ref 11.4–15.2)

## 2021-09-19 LAB — BASIC METABOLIC PANEL
Anion gap: 11 (ref 5–15)
BUN: 22 mg/dL (ref 8–23)
CO2: 43 mmol/L — ABNORMAL HIGH (ref 22–32)
Calcium: 9 mg/dL (ref 8.9–10.3)
Chloride: 86 mmol/L — ABNORMAL LOW (ref 98–111)
Creatinine, Ser: 0.82 mg/dL (ref 0.61–1.24)
GFR, Estimated: >60 mL/min (ref >60)
Glucose, Bld: 165 mg/dL — ABNORMAL HIGH (ref 70–99)
Potassium: 4 mmol/L (ref 3.5–5.1)
Sodium: 140 mmol/L (ref 135–145)

## 2021-09-19 LAB — MAGNESIUM: Magnesium: 2.5 mg/dL — ABNORMAL HIGH (ref 1.7–2.4)

## 2021-09-19 MED ORDER — WARFARIN SODIUM 7.5 MG PO TABS
7.5000 mg | ORAL_TABLET | Freq: Once | ORAL | Status: AC
  Administered 2021-09-19: 7.5 mg via ORAL
  Filled 2021-09-19: qty 1

## 2021-09-19 MED ORDER — NICOTINE 21 MG/24HR TD PT24
21.0000 mg | MEDICATED_PATCH | Freq: Every day | TRANSDERMAL | Status: DC
  Administered 2021-09-19 – 2021-09-20 (×2): 21 mg via TRANSDERMAL
  Filled 2021-09-19 (×2): qty 1

## 2021-09-19 MED ORDER — PREDNISONE 20 MG PO TABS
30.0000 mg | ORAL_TABLET | Freq: Every day | ORAL | Status: DC
  Administered 2021-09-20: 30 mg via ORAL
  Filled 2021-09-19: qty 1

## 2021-09-19 NOTE — Plan of Care (Signed)
  Problem: Acute Rehab PT Goals(only PT should resolve) Goal: Pt Will Go Supine/Side To Sit Outcome: Progressing Flowsheets (Taken 09/19/2021 1538) Pt will go Supine/Side to Sit:  with modified independence  with supervision Goal: Patient Will Transfer Sit To/From Stand Outcome: Progressing Flowsheets (Taken 09/19/2021 1538) Patient will transfer sit to/from stand:  with modified independence  with supervision Goal: Pt Will Transfer Bed To Chair/Chair To Bed Outcome: Progressing Flowsheets (Taken 09/19/2021 1538) Pt will Transfer Bed to Chair/Chair to Bed:  with modified independence  with supervision Goal: Pt Will Ambulate Outcome: Progressing Flowsheets (Taken 09/19/2021 1538) Pt will Ambulate:  50 feet  with modified independence  with rolling walker   3:39 PM, 09/19/21 Lonell Grandchild, MPT Physical Therapist with [facility] [phone] office [phone] mobile phone

## 2021-09-19 NOTE — Consult Note (Signed)
[facility]   Patient name: Jesus Rodriguez Admit date: 09/17/2021  DOB: January 05, 1951 LOS: 2  MRN: [id number] Consult date: 09/18/2021  Referring provider: Dr. Wynetta Emery, [facility]: Dyspnea    History:  71 yo male smoker presented to [facility] ER with worsening dyspnea, bloating for 3 days.  Needing 6 liters oxygen in ER.  Started on therapy for COPD exacerbation and CHF exacerbation.  Found to have hypercapnia with hypoxia and started on Bipap.  PCCM consulted to assist with respiratory management.  Past medical history:  HTN, HLD, COPD, Chronic respiratory failure on 3 liters, Diastolic CHF,  A fib, RA, Depression, Scrotal cancer, Anxiety, OSA  Significant events:  6/06 Admit 6/07 start on Bipap  Studies:  Echo 09/18/21 >> EF 55 to 60%, mild LVH, aortic root 40 mm  Micro:  COVID/Flu 6/06 >> negative  Lines:     Antibiotics:    Consults:      Interim history:  Breathing better.  Leg swelling down.  Feels weak and unsteady when standing/walking in his room.    Vital signs:  BP 137/75   Pulse 85   Temp 97.8 F (36.6 C) (Oral)   Resp (!) 22   Ht '6\' 1"'$  (1.854 m)   Wt (!) 173.4 kg   SpO2 (!) 89%   BMI 50.44 kg/m   Intake/output:  I/O last 3 completed shifts: In: 600 [P.O.:600] Out: 2700 [Urine:2700]   Physical exam:   General - alert Eyes - pupils reactive ENT - no sinus tenderness, no stridor Cardiac - regular rate/rhythm, no murmur Chest - equal breath sounds b/l, no wheezing or rales Abdomen - soft, non tender, + bowel sounds Extremities - no cyanosis, clubbing, or edema Skin - venous stasis changes Neuro - normal strength, moves extremities, follows commands Psych - normal mood and behavior    Best practice:   DVT - Coumadin SUP - N/A Nutrition - Heart healthy   Assessment/plan:   COPD exacerbation. - yupelri, brovana, pulmicort for now; eventual plan to transition back to trelegy - will transition  to prednisone on 6/09 - prn albuterol - will need PFT as outpt  Sleep disordered breathing with acute on chronic hypoxic/hypercapnic respiratory failure. - goal SpO2 > 90% - Bipap qhs and prn - will need further sleep assessment as outpt  Metabolic alkalosis in setting of respiratory acidosis. - might also have component of contraction alkalosis from recent increase in diuresis - diamox started 6/08 for 2 days - continue lasix 60 mg IV daily for now  Tobacco abuse. - smoking cessation  Acute on chronic diastolic CHF. Atrial fibrillation. - per primary team - will need new outpt PCP in [facility] or [place]  Deconditioning. - will ask PT to assess  Resolved hospital problems:    Goals of care/Family discussions:  Code status: full  Labs:      Latest Ref Rng & Units 09/19/2021    4:19 AM 09/18/2021    3:37 AM 09/17/2021    8:57 AM  CMP  Glucose 70 - 99 mg/dL 165  154  109   BUN 8 - 23 mg/dL '22  20  16   '$ Creatinine 0.61 - 1.24 mg/dL 0.82  0.79  0.80   Sodium 135 - 145 mmol/L 140  142  143   Potassium 3.5 - 5.1 mmol/L 4.0  3.3  3.0   Chloride 98 - 111 mmol/L 86  88  85   CO2 22 - 32 mmol/L 43  <45  >  45   Calcium 8.9 - 10.3 mg/dL 9.0  8.8  9.1   Total Protein 6.5 - 8.1 g/dL   7.1   Total Bilirubin 0.3 - 1.2 mg/dL   0.9   Alkaline Phos 38 - 126 U/L   98   AST 15 - 41 U/L   17   ALT 0 - 44 U/L   11        Latest Ref Rng & Units 09/19/2021    4:19 AM 09/17/2021    8:57 AM 11/26/2020    4:22 AM  CBC  WBC 4.0 - 10.5 K/uL 8.5  7.5  7.1   Hemoglobin 13.0 - 17.0 g/dL 12.3  12.4  12.5   Hematocrit 39.0 - 52.0 % 40.2  40.0  39.3   Platelets 150 - 400 K/uL 202  192  253     ABG    Component Value Date/Time   PHART 7.52 (H) 09/18/2021 0835   PCO2ART 76 (HH) 09/18/2021 0835   PO2ART 61 (L) 09/18/2021 0835   HCO3 62.1 (H) 09/18/2021 0835   O2SAT 95.3 09/18/2021 0835    Signature:  Chesley Mires, MD [place] Pager - [phone] 09/19/2021, 1:43  PM

## 2021-09-19 NOTE — Evaluation (Signed)
Physical Therapy Evaluation Patient Details Name: Jesus Rodriguez MRN: [id number] DOB: 1951-03-08 Today's Date: 09/19/2021  History of Present Illness  Jesus Rodriguez is a 71 year old male with a history of hypertension, hyperlipidemia, COPD, diastolic CHF, atrial fibrillation, rheumatoid arthritis, depression, scrotal carcinoma presenting with 3-day history of shortness of breath and increasing abdominal girth.  The patient is chronically on 3 L nasal cannula at home.  The patient also describes orthopnea type symptoms.  He states that his leg edema is about the same as usual.  He denies any fevers, chills, hemoptysis, abdominal pain, nausea, vomiting, diarrhea.  He continues to smoke 1 pack/day.  Because of his continued shortness of breath, the patient presented for further evaluation and treatment. .  Clinical Impression  Patient functioning near baseline for functional mobility and gait demonstrating fair/good return for bed mobility, transfers and ambulation in room without loss of balance. Patient demonstrates labored movement for completing sit to stand and moving legs onto bed during bed mobility and limited for ambulation mostly due to fatigue while on 3 LPM with SpO2 dropping from 92% to 89%.  Patient tolerated staying up in chair after therapy - nursing staff aware.  Patient will benefit from continued skilled physical therapy in hospital and recommended venue below to increase strength, balance, endurance for safe ADLs and gait.          Recommendations for follow up therapy are one component of a multi-disciplinary discharge planning process, led by the attending physician.  Recommendations may be updated based on patient status, additional functional criteria and insurance authorization.  Follow Up Recommendations Home health PT    Assistance Recommended at Discharge Set up Supervision/Assistance  Patient can return home with the following  A little help with walking and/or  transfers;A little help with bathing/dressing/bathroom;Help with stairs or ramp for entrance;Assistance with cooking/housework    Equipment Recommendations None recommended by PT  Recommendations for Other Services       Functional Status Assessment Patient has had a recent decline in their functional status and demonstrates the ability to make significant improvements in function in a reasonable and predictable amount of time.     Precautions / Restrictions Precautions Precautions: Fall Restrictions Weight Bearing Restrictions: No      Mobility  Bed Mobility Overal bed mobility: Needs Assistance Bed Mobility: Supine to Sit, Sit to Supine     Supine to sit: Supervision Sit to supine: Modified independent (Device/Increase time)   General bed mobility comments: labored movement, mild difficulty lifting legs onto bed during sit to supine    Transfers Overall transfer level: Needs assistance Equipment used: Rolling walker (2 wheels) Transfers: Sit to/from Stand, Bed to chair/wheelchair/BSC Sit to Stand: Supervision   Step pivot transfers: Supervision       General transfer comment: increased time, slightly labored movement    Ambulation/Gait Ambulation/Gait assistance: Supervision Gait Distance (Feet): 30 Feet Assistive device: Rolling walker (2 wheels) Gait Pattern/deviations: Decreased step length - right, Decreased step length - left, Decreased stride length, Trunk flexed Gait velocity: decreased     General Gait Details: slightly labored cadence without loss of balance, limited mostly due to fatigue, on 3 LPM with SpO2 dropping from 92% to 89%  Stairs            Wheelchair Mobility    Modified Rankin (Stroke Patients Only)       Balance Overall balance assessment: Needs assistance Sitting-balance support: Feet supported, No upper extremity supported Sitting balance-Leahy Scale: Good Sitting balance - Comments:  seated at EOB   Standing balance  support: During functional activity, Bilateral upper extremity supported Standing balance-Leahy Scale: Fair Standing balance comment: fair/good using RW                             Pertinent Vitals/Pain Pain Assessment Pain Assessment: No/denies pain    Home Living Family/patient expects to be discharged to:: Private residence Living Arrangements: Children Available Help at Discharge: Family;Available 24 hours/day Type of Home: Mobile home Home Access: Stairs to enter Entrance Stairs-Rails: Right;Left;Can reach both Entrance Stairs-Number of Steps: 5-6   Home Layout: One level Home Equipment: Rollator (4 wheels);BSC/3in1;Grab bars - tub/shower;Standard Environmental consultant      Prior Function Prior Level of Function : Independent/Modified Independent             Mobility Comments: household and short distanced Electronics engineer, drives, 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 ADLs Comments: Assited by family     Hand Dominance   Dominant Hand: Right    Extremity/Trunk Assessment   Upper Extremity Assessment Upper Extremity Assessment: Overall WFL for tasks assessed    Lower Extremity Assessment Lower Extremity Assessment: Generalized weakness    Cervical / Trunk Assessment Cervical / Trunk Assessment: Normal  Communication   Communication: No difficulties  Cognition Arousal/Alertness: Awake/alert Behavior During Therapy: WFL for tasks assessed/performed Overall Cognitive Status: Within Functional Limits for tasks assessed                                          General Comments      Exercises     Assessment/Plan    PT Assessment Patient needs continued PT services  PT Problem List Decreased strength;Decreased activity tolerance;Decreased balance;Decreased mobility       PT Treatment Interventions DME instruction;Gait training;Stair training;Functional mobility training;Therapeutic activities;Therapeutic exercise;Patient/family  education;Balance training    PT Goals (Current goals can be found in the Care Plan section)  Acute Rehab PT Goals Patient Stated Goal: return home with family to assist PT Goal Formulation: With patient Time For Goal Achievement: 09/26/21 Potential to Achieve Goals: Good    Frequency Min 3X/week     Co-evaluation               AM-PAC PT "6 Clicks" Mobility  Outcome Measure Help needed turning from your back to your side while in a flat bed without using bedrails?: None Help needed moving from lying on your back to sitting on the side of a flat bed without using bedrails?: None Help needed moving to and from a bed to a chair (including a wheelchair)?: A Little Help needed standing up from a chair using your arms (e.g., wheelchair or bedside chair)?: A Little Help needed to walk in hospital room?: A Little Help needed climbing 3-5 steps with a railing? : A Little 6 Click Score: 20    End of Session Equipment Utilized During Treatment: Oxygen Activity Tolerance: Patient tolerated treatment well;Patient limited by fatigue Patient left: in chair;with call bell/phone within reach Nurse Communication: Mobility status PT Visit Diagnosis: Unsteadiness on feet (R26.81);Other abnormalities of gait and mobility (R26.89);Muscle weakness (generalized) (M62.81)    Time: 3500-9381 PT Time Calculation (min) (ACUTE ONLY): 23 min   Charges:   PT Evaluation $PT Eval Moderate Complexity: 1 Mod PT Treatments $Therapeutic Activity: 23-37 mins  3:37 PM, 09/19/21 Lonell Grandchild, MPT Physical Therapist with [facility] [phone] office [phone] mobile phone

## 2021-09-19 NOTE — Progress Notes (Signed)
PROGRESS NOTE   Jesus Rodriguez  SNK:[id number] DOB: 1951-02-14 DOA: 09/17/2021 PCP: [facility]   Chief Complaint  Patient presents with   Shortness of Breath   Level of care: Stepdown  Brief Admission History:  71 year old male with a history of hypertension, hyperlipidemia, COPD, diastolic CHF, atrial fibrillation, rheumatoid arthritis, depression, scrotal carcinoma presenting with 3-day history of shortness of breath and increasing abdominal girth.  The patient is chronically on 3 L nasal cannula at home.  The patient also describes orthopnea type symptoms.  He states that his leg edema is about the same as usual.  He denies any fevers, chills, hemoptysis, abdominal pain, nausea, vomiting, diarrhea.  He continues to smoke 1 pack/day.  Because of his continued shortness of breath, the patient presented for further evaluation and treatment.  In the ED, the patient was afebrile hemodynamically stable with oxygen saturation 95% on 6 L.  WBC 7.5, hemoglobin 12.4, platelets 192,000.  BMP showed sodium 143, potassium 3.0, bicarbonate greater than 45, serum creatinine 0.80.  EKG showed atrial fibrillation with nonspecific ST/T wave changes.  Chest x-ray showed pulmonary edema and bilateral pleural effusions.  INR was 2.0.  COVID PCR was negative.  I ordered for the patient to receive 80 mg IV Lasix.  He had received albuterol nebulizer and Solu-Medrol 125 mg IV.   Assessment and Plan: * Acute on chronic respiratory failure with hypoxia and hypercarbia Secondary to pulmonary edema and COPD exacerbation Chronically on 3 L at home Currently stable on 6 L Wean oxygen back to baseline He has very high pCO2 level and I have requested inpatient pulmonary consultation  Pt now on yupelri, pulmicort, brovana nebs   Mixed hyperlipidemia Continue statin  Acute on chronic diastolic CHF (congestive heart failure) ([place]) Initially treated with furosemide 80 mg IV twice daily and now reduced to 60  mg daily  Daily weights Accurate I's and O's Update echocardiogram 11/26/2020 echo EF 55 to 60%, trivial MR, moderate PASP elevation I/O last 3 completed shifts: In: 600 [P.O.:600] Out: 2700 [Urine:2700] No intake/output data recorded.  Filed Weights   09/17/21 1319 09/18/21 0417 09/19/21 0500  Weight: (!) 175.1 kg (!) 172.9 kg (!) 173.4 kg      Tobacco use disorder Tobacco cessation discussed  COPD with acute exacerbation (HCC) Continue pulmicort, brovana, yupelri nebs per pulmonology team  Continue IV Solu-Medrol  Obesity, Class III, BMI 40-49.9 (morbid obesity) (HCC) BMI>40 Lifestyle modification  Atrial fibrillation (HCC) Continue warfarin Continue metoprolol heart rate Currently rate controlled  Hypertension Continue metoprolol for blood pressure management.   DVT prophylaxis: warfarin  Code Status: full  Family Communication:  Disposition: Status is: Inpatient Remains inpatient appropriate because: he remains on IV steroids, IV furosemide in stepdown ICU    Consultants:  PCCM  Procedures:  Bipap  Antimicrobials:  levo >>   Subjective: Pt sitting up in chair, says breathing a little better, no chest pain, no diarrhea.    Objective: Vitals:   09/19/21 0700 09/19/21 0746 09/19/21 0800 09/19/21 0900  BP:    (!) 112/55  Pulse:  (!) 128 (!) 107 (!) 106  Resp:  19 (!) 24 (!) 21  Temp: 98.2 F (36.8 C)     TempSrc: Axillary     SpO2:  92% 92% (!) 85%  Weight:      Height:        Intake/Output Summary (Last 24 hours) at 09/19/2021 1006 Last data filed at 09/19/2021 0500 Gross per 24 hour  Intake  360 ml  Output 2050 ml  Net -1690 ml   Filed Weights   09/17/21 1319 09/18/21 0417 09/19/21 0500  Weight: (!) 175.1 kg (!) 172.9 kg (!) 173.4 kg   Examination:  General exam: Appears chronically ill, he appears older than stated age, he is on [place], normal mentation. He is morbidly obese.  Respiratory system: less diffuse expiratory wheezing and better air  movement heard.   Cardiovascular system: normal S1 & S2 heard. No JVD, murmurs, rubs, gallops or clicks. No pedal edema. Gastrointestinal system: Abdomen is morbidly obese, soft and nontender. No organomegaly or masses felt. Normal bowel sounds heard. Central nervous system: Alert and oriented. No focal neurological deficits. Extremities: Symmetric 5 x 5 power. Skin: No rashes, lesions or ulcers. Psychiatry: Judgement and insight appear poor. Mood & affect appropriate.   Data Reviewed: I have personally reviewed following labs and imaging studies  CBC: Recent Labs  Lab 09/17/21 0857 09/19/21 0419  WBC 7.5 8.5  NEUTROABS 5.5  --   HGB 12.4* 12.3*  HCT 40.0 40.2  MCV 96.6 97.1  PLT 192 664    Basic Metabolic Panel: Recent Labs  Lab 09/17/21 0857 09/18/21 0337 09/19/21 0419  NA 143 142 140  K 3.0* 3.3* 4.0  CL 85* 88* 86*  CO2 >45* <45* 43*  GLUCOSE 109* 154* 165*  BUN '16 20 22  '$ CREATININE 0.80 0.79 0.82  CALCIUM 9.1 8.8* 9.0  MG  --   --  2.5*    CBG: No results for input(s): "GLUCAP" in the last 168 hours.  Recent Results (from the past 240 hour(s))  Resp Panel by RT-PCR (Flu A&B, Covid) Anterior Nasal Swab     Status: None   Collection Time: 09/17/21  8:57 AM   Specimen: Anterior Nasal Swab  Result Value Ref Range Status   SARS Coronavirus 2 by RT PCR NEGATIVE NEGATIVE Final    Comment: (NOTE) SARS-CoV-2 target nucleic acids are NOT DETECTED.  The SARS-CoV-2 RNA is generally detectable in upper respiratory specimens during the acute phase of infection. The lowest concentration of SARS-CoV-2 viral copies this assay can detect is 138 copies/mL. A negative result does not preclude SARS-Cov-2 infection and should not be used as the sole basis for treatment or other patient management decisions. A negative result may occur with  improper specimen collection/handling, submission of specimen other than nasopharyngeal swab, presence of viral mutation(s) within  the areas targeted by this assay, and inadequate number of viral copies(<138 copies/mL). A negative result must be combined with clinical observations, patient history, and epidemiological information. The expected result is Negative.  Fact Sheet for Patients:  EntrepreneurPulse.com.au  Fact Sheet for Healthcare Providers:  IncredibleEmployment.be  This test is no t yet approved or cleared by the [place] FDA and  has been authorized for detection and/or diagnosis of SARS-CoV-2 by FDA under an Emergency Use Authorization (EUA). This EUA will remain  in effect (meaning this test can be used) for the duration of the COVID-19 declaration under Section 564(b)(1) of the Act, 21 U.S.C.section 360bbb-3(b)(1), unless the authorization is terminated  or revoked sooner.       Influenza A by PCR NEGATIVE NEGATIVE Final   Influenza B by PCR NEGATIVE NEGATIVE Final    Comment: (NOTE) The Xpert Xpress SARS-CoV-2/FLU/RSV plus assay is intended as an aid in the diagnosis of influenza from Nasopharyngeal swab specimens and should not be used as a sole basis for treatment. Nasal washings and aspirates are unacceptable for Xpert Xpress  SARS-CoV-2/FLU/RSV testing.  Fact Sheet for Patients: EntrepreneurPulse.com.au  Fact Sheet for Healthcare Providers: IncredibleEmployment.be  This test is not yet approved or cleared by the [place] FDA and has been authorized for detection and/or diagnosis of SARS-CoV-2 by FDA under an Emergency Use Authorization (EUA). This EUA will remain in effect (meaning this test can be used) for the duration of the COVID-19 declaration under Section 564(b)(1) of the Act, 21 U.S.C. section 360bbb-3(b)(1), unless the authorization is terminated or revoked.  Performed at [facility], [address], [place], [place] [address]   MRSA Next Gen by PCR, Nasal     Status: None   Collection  Time: 09/18/21 12:56 PM   Specimen: Nasal Mucosa; Nasal Swab  Result Value Ref Range Status   MRSA by PCR Next Gen NOT DETECTED NOT DETECTED Final    Comment: (NOTE) The GeneXpert MRSA Assay (FDA approved for NASAL specimens only), is one component of a comprehensive MRSA colonization surveillance program. It is not intended to diagnose MRSA infection nor to guide or monitor treatment for MRSA infections. Test performance is not FDA approved in patients less than 64 years old. Performed at [facility], [address] [place], [place] [address]      Radiology Studies: [facility] 1 View  Result Date: 09/19/2021 CLINICAL DATA:  Shortness of breath. EXAM: PORTABLE CHEST 1 VIEW COMPARISON:  Chest radiograph date 01/12/2021 and CT dated 11/27/2019. FINDINGS: Shallow inspiration. Bilateral lower lung field opacities progressed since the prior radiograph, possibly combination of small pleural effusions and associated atelectasis or infiltrate. No pneumothorax. Stable cardiomegaly. No acute osseous pathology. IMPRESSION: Probable small bilateral pleural effusions and associated atelectasis or infiltrate, progressed since the prior radiograph. Electronically Signed   By: Anner Crete M.D.   On: 09/19/2021 01:06   ECHOCARDIOGRAM COMPLETE  Result Date: 09/18/2021    ECHOCARDIOGRAM REPORT   Patient Name:   Jesus Rodriguez Date of Exam: 09/18/2021 Medical Rec #:  [id number]      Height:       73.0 in Accession #:    [id number]     Weight:       381.2 lb Date of Birth:  11/03/1950      BSA:          2.830 m Patient Age:    34 years       BP:           97/48 mmHg Patient Gender: M              HR:           80 bpm. Exam Location:  [facility] Procedure: 2D Echo, Cardiac Doppler and Color Doppler Indications:    CHF  History:        Patient has prior history of Echocardiogram examinations, most                 recent 11/26/2020. CHF, COPD, Arrythmias:Atrial Fibrillation;                 Risk  Factors:Hypertension, Dyslipidemia and Current Smoker.                 Patient is sitting for the echo due to SOB and is continually                 coughing.  Sonographer:    Wenda Low Referring Phys: [phone] DAVID TAT  Sonographer Comments: Patient is morbidly obese. Image acquisition challenging due to COPD and Image acquisition  challenging due to respiratory motion. IMPRESSIONS  1. Abnormal septal motion . Left ventricular ejection fraction, by estimation, is 55 to 60%. The left ventricle has normal function. The left ventricle has no regional wall motion abnormalities. There is mild left ventricular hypertrophy. Left ventricular diastolic parameters are indeterminate.  2. Right ventricular systolic function is normal. The right ventricular size is normal. There is normal pulmonary artery systolic pressure.  3. Right atrial size was mildly dilated.  4. The mitral valve is abnormal. Trivial mitral valve regurgitation. No evidence of mitral stenosis.  5. The aortic valve is tricuspid. There is moderate calcification of the aortic valve. There is moderate thickening of the aortic valve. Aortic valve regurgitation is not visualized. Aortic valve sclerosis/calcification is present, without any evidence of aortic stenosis.  6. Aortic dilatation noted. There is mild dilatation of the aortic root, measuring 40 mm.  7. The inferior vena cava is dilated in size with >50% respiratory variability, suggesting right atrial pressure of 8 mmHg. FINDINGS  Left Ventricle: Abnormal septal motion. Left ventricular ejection fraction, by estimation, is 55 to 60%. The left ventricle has normal function. The left ventricle has no regional wall motion abnormalities. The left ventricular internal cavity size was normal in size. There is mild left ventricular hypertrophy. Left ventricular diastolic parameters are indeterminate. Right Ventricle: The right ventricular size is normal. No increase in right ventricular wall thickness.  Right ventricular systolic function is normal. There is normal pulmonary artery systolic pressure. The tricuspid regurgitant velocity is 2.04 m/s, and  with an assumed right atrial pressure of 8 mmHg, the estimated right ventricular systolic pressure is 19.6 mmHg. Left Atrium: Left atrial size was normal in size. Right Atrium: Right atrial size was mildly dilated. Pericardium: There is no evidence of pericardial effusion. Mitral Valve: The mitral valve is abnormal. There is mild thickening of the mitral valve leaflet(s). There is moderate calcification of the mitral valve leaflet(s). Trivial mitral valve regurgitation. No evidence of mitral valve stenosis. MV peak gradient, 9.5 mmHg. The mean mitral valve gradient is 5.0 mmHg. Tricuspid Valve: The tricuspid valve is normal in structure. Tricuspid valve regurgitation is trivial. No evidence of tricuspid stenosis. Aortic Valve: The aortic valve is tricuspid. There is moderate calcification of the aortic valve. There is moderate thickening of the aortic valve. Aortic valve regurgitation is not visualized. Aortic valve sclerosis/calcification is present, without any  evidence of aortic stenosis. Aortic valve mean gradient measures 4.0 mmHg. Aortic valve peak gradient measures 8.5 mmHg. Aortic valve area, by VTI measures 2.55 cm. Pulmonic Valve: The pulmonic valve was normal in structure. Pulmonic valve regurgitation is not visualized. No evidence of pulmonic stenosis. Aorta: Aortic dilatation noted. There is mild dilatation of the aortic root, measuring 40 mm. Venous: The inferior vena cava is dilated in size with greater than 50% respiratory variability, suggesting right atrial pressure of 8 mmHg. IAS/Shunts: No atrial level shunt detected by color flow Doppler.  LEFT VENTRICLE PLAX 2D LVIDd:         5.90 cm   Diastology LVIDs:         3.60 cm   LV e' medial:    9.90 cm/s LV PW:         1.20 cm   LV E/e' medial:  10.2 LV IVS:        1.30 cm   LV e' lateral:   14.10  cm/s LVOT diam:     2.10 cm   LV E/e' lateral: 7.2 LV SV:  71 LV SV Index:   25 LVOT Area:     3.46 cm  RIGHT VENTRICLE RV Basal diam:  4.30 cm RV Mid diam:    4.50 cm TAPSE (M-mode): 3.2 cm LEFT ATRIUM            Index        RIGHT ATRIUM           Index LA diam:      4.40 cm  1.55 cm/m   RA Area:     31.90 cm LA Vol (A4C): 123.0 ml 43.46 ml/m  RA Volume:   119.00 ml 42.05 ml/m  AORTIC VALVE                    PULMONIC VALVE AV Area (Vmax):    2.59 cm     PV Vmax:       0.75 m/s AV Area (Vmean):   2.71 cm     PV Peak grad:  2.3 mmHg AV Area (VTI):     2.55 cm AV Vmax:           146.00 cm/s AV Vmean:          90.500 cm/s AV VTI:            0.280 m AV Peak Grad:      8.5 mmHg AV Mean Grad:      4.0 mmHg LVOT Vmax:         109.00 cm/s LVOT Vmean:        70.900 cm/s LVOT VTI:          0.206 m LVOT/AV VTI ratio: 0.74  AORTA Ao Root diam: 4.00 cm Ao Asc diam:  3.60 cm MITRAL VALVE                TRICUSPID VALVE MV Area (PHT): 4.26 cm     TR Peak grad:   16.6 mmHg MV Area VTI:   2.14 cm     TR Vmax:        204.00 cm/s MV Peak grad:  9.5 mmHg MV Mean grad:  5.0 mmHg     SHUNTS MV Vmax:       1.54 m/s     Systemic VTI:  0.21 m MV Vmean:      96.1 cm/s    Systemic Diam: 2.10 cm MV Decel Time: 178 msec MV E velocity: 101.00 cm/s Jenkins Rouge MD Electronically signed by Jenkins Rouge MD Signature Date/Time: 09/18/2021/11:40:06 AM    Final     Scheduled Meds:  acetaZOLAMIDE  250 mg Oral BID   arformoterol  15 mcg Nebulization BID   budesonide (PULMICORT) nebulizer solution  0.5 mg Nebulization BID   chlorhexidine  15 mL Mouth Rinse BID   Chlorhexidine Gluconate Cloth  6 each Topical Daily   furosemide  60 mg Intravenous Daily   Living Better with Heart Failure Book   Does not apply Once   mouth rinse  15 mL Mouth Rinse q12n4p   methylPREDNISolone (SOLU-MEDROL) injection  60 mg Intravenous Q12H   metoprolol tartrate  75 mg Oral BID   potassium chloride  30 mEq Oral BID   revefenacin  175 mcg  Nebulization Daily   simvastatin  20 mg Oral q1800   sodium chloride flush  3 mL Intravenous Q12H   warfarin  7.5 mg Oral ONCE-1600   Warfarin - Pharmacist Dosing Inpatient   Does not apply q1600   Continuous Infusions:  sodium chloride  LOS: 2 days  Critical Care Procedure Note Authorized and Performed by: Murvin Natal MD  Total Critical Care time:  40 mins Due to a high probability of clinically significant, life threatening deterioration, the patient required my highest level of preparedness to intervene emergently and I personally spent this critical care time directly and personally managing the patient.  This critical care time included obtaining a history; examining the patient, pulse oximetry; ordering and review of studies; arranging urgent treatment with development of a management plan; evaluation of patient's response of treatment; frequent reassessment; and discussions with other providers.  This critical care time was performed to assess and manage the high probability of imminent and life threatening deterioration that could result in multi-organ failure.  It was exclusive of separately billable procedures and treating other patients and teaching time.    Irwin Brakeman, MD How to contact the [facility] Attending or Consulting provider [place] or covering provider during after hours [place], for this patient?  Check the care team in [facility] and look for a) attending/consulting [facility] provider listed and b) the [facility] team listed Log into [email or web address] and use [place]'s universal password to access. If you do not have the password, please contact the hospital operator. Locate the [facility] provider you are looking for under [facility] and page to a number that you can be directly reached. If you still have difficulty reaching the provider, please page the [facility] (Director on Call) for the Hospitalists listed on amion for assistance.  09/19/2021, 10:06 AM

## 2021-09-19 NOTE — Progress Notes (Signed)
ANTICOAGULATION CONSULT NOTE -  Pharmacy Consult for warfarin Indication: atrial fibrillation  Allergies  Allergen Reactions   Celebrex [Celecoxib]    Methotrexate Derivatives Other (See Comments)    Arrhythmia    Sulfa Antibiotics     Patient Measurements: Height: '6\' 1"'$  (185.4 cm) Weight: (!) 173.4 kg (382 lb 4.4 oz) IBW/kg (Calculated) : 79.9 Heparin Dosing Weight:   Vital Signs: Temp: 97.6 F (36.4 C) (06/08 0400) Temp Source: Oral (06/08 0400) BP: 116/59 (06/08 0511) Pulse Rate: 128 (06/08 0746)  Labs: Recent Labs    09/17/21 0857 09/17/21 1230 09/18/21 0337 09/19/21 0419  HGB 12.4*  --   --  12.3*  HCT 40.0  --   --  40.2  PLT 192  --   --  202  LABPROT  --  24.4* 23.5* 20.1*  INR  --  2.2* 2.1* 1.7*  CREATININE 0.80  --  0.79 0.82     Estimated Creatinine Clearance: 137.1 mL/min (by C-G formula based on SCr of 0.82 mg/dL).   Medical History: Past Medical History:  Diagnosis Date   Anxiety 02/13/2016   Atrial flutter ([place]) 02/13/2016   s/p CTI ablation at [facility]   Chronic a-fib [facility])    DDD (degenerative disc disease), lumbar 02/13/2016   Essential hypertension 02/13/2016   Insomnia 02/13/2016   Mixed hyperlipidemia 02/13/2016   Osteoarthritis of both hands    Persistent atrial fibrillation (HCC)    RA (rheumatoid arthritis) ([place]) 02/13/2016   Sero Negative   Scrotal cancer ([place]) 02/13/2016   Sleep apnea 02/13/2016    Medications:  Medications Prior to Admission  Medication Sig Dispense Refill Last Dose   acetaminophen (TYLENOL) 500 MG tablet Take 500 mg by mouth every 6 (six) hours as needed.   unk   albuterol (VENTOLIN HFA) 108 (90 Base) MCG/ACT inhaler Inhale 1 puff into the lungs every 6 (six) hours as needed.   unk   benazepril (LOTENSIN) 10 MG tablet Take 1 tablet (10 mg total) by mouth daily. 90 tablet 3 09/16/2021   doxycycline (VIBRAMYCIN) 100 MG capsule Take 100 mg by mouth daily.   09/16/2021   ketoconazole (NIZORAL) 2 % cream Apply 1  application topically 2 (two) times daily as needed for irritation.   09/16/2021   Metoprolol Tartrate 75 MG TABS TAKE 1 TABLET BY MOUTH TWICE A DAY 180 tablet 2 09/17/2021 at 0430   simvastatin (ZOCOR) 20 MG tablet TAKE 1 TABLET BY MOUTH DAILY AT 6 PM. 90 tablet 1 09/16/2021   torsemide (DEMADEX) 20 MG tablet Take 3 tablets (60 mg total) by mouth 2 (two) times daily. 540 tablet 0 09/16/2021   TRELEGY ELLIPTA 100-62.5-25 MCG/INH AEPB Inhale 1 puff into the lungs daily.   09/16/2021   triamcinolone (KENALOG) 0.1 % Apply 1 application topically 3 (three) times daily.   09/16/2021   warfarin (COUMADIN) 10 MG tablet TAKE 1/2 TO 1 TABLET DAILY AS DIRECTED BY COUMADIN CLINIC. (Patient taking differently: Take 5-10 mg by mouth daily. Tue thur and Sunday take '5mg'$  and mon, wed, fri, and sat take '10mg'$ ) 90 tablet 3 09/15/2021 at 2100   benazepril (LOTENSIN) 10 MG tablet Take 1 tablet (10 mg total) by mouth daily. (Patient not taking: Reported on 09/17/2021) 30 tablet 6 Not Taking    Assessment: Pharmacy consulted to dose warfarin in patient with atrial fibrillation. INR on admission is therapeutic at 2.2 with last dosze 6/4 2100.  Home dose listed as 5 mg on Tue-Thur-Sun and 10 mg ROW  INR 2.1 > 1.7  Goal of Therapy:  INR 2-3 Monitor platelets by anticoagulation protocol: Yes   Plan:  Warfarin 7.5 mg x 1 dose Monitor daily INR and s/s of bleeding  Margot Ables, PharmD Clinical Pharmacist 09/19/2021 8:06 AM

## 2021-09-20 DIAGNOSIS — I5033 Acute on chronic diastolic (congestive) heart failure: Secondary | ICD-10-CM | POA: Diagnosis not present

## 2021-09-20 DIAGNOSIS — J441 Chronic obstructive pulmonary disease with (acute) exacerbation: Secondary | ICD-10-CM | POA: Diagnosis not present

## 2021-09-20 DIAGNOSIS — J9621 Acute and chronic respiratory failure with hypoxia: Secondary | ICD-10-CM | POA: Diagnosis not present

## 2021-09-20 DIAGNOSIS — I4891 Unspecified atrial fibrillation: Secondary | ICD-10-CM | POA: Diagnosis not present

## 2021-09-20 LAB — BASIC METABOLIC PANEL
Anion gap: 7 (ref 5–15)
BUN: 25 mg/dL — ABNORMAL HIGH (ref 8–23)
CO2: 43 mmol/L — ABNORMAL HIGH (ref 22–32)
Calcium: 9.1 mg/dL (ref 8.9–10.3)
Chloride: 90 mmol/L — ABNORMAL LOW (ref 98–111)
Creatinine, Ser: 0.77 mg/dL (ref 0.61–1.24)
GFR, Estimated: >60 mL/min (ref >60)
Glucose, Bld: 204 mg/dL — ABNORMAL HIGH (ref 70–99)
Potassium: 4.3 mmol/L (ref 3.5–5.1)
Sodium: 140 mmol/L (ref 135–145)

## 2021-09-20 LAB — PROTIME-INR
INR: 1.7 — ABNORMAL HIGH (ref 0.8–1.2)
Prothrombin Time: 19.3 seconds — ABNORMAL HIGH (ref 11.4–15.2)

## 2021-09-20 MED ORDER — POTASSIUM CHLORIDE CRYS ER 20 MEQ PO TBCR: 20.0000 meq | EXTENDED_RELEASE_TABLET | Freq: Every day | ORAL | 0 refills | Status: DC

## 2021-09-20 MED ORDER — ALBUTEROL SULFATE (2.5 MG/3ML) 0.083% IN NEBU: 2.5000 mg | INHALATION_SOLUTION | RESPIRATORY_TRACT | 2 refills | Status: DC | PRN

## 2021-09-20 MED ORDER — WARFARIN SODIUM 10 MG PO TABS: 5.0000 mg | ORAL_TABLET | Freq: Every day | ORAL | Status: DC

## 2021-09-20 MED ORDER — BENAZEPRIL HCL 10 MG PO TABS
10.0000 mg | ORAL_TABLET | Freq: Every day | ORAL | Status: DC
  Administered 2021-09-20: 10 mg via ORAL
  Filled 2021-09-20: qty 1

## 2021-09-20 MED ORDER — IPRATROPIUM-ALBUTEROL 0.5-2.5 (3) MG/3ML IN SOLN: 3.0000 mL | RESPIRATORY_TRACT | 1 refills | Status: DC | PRN

## 2021-09-20 MED ORDER — PREDNISONE 10 MG PO TABS: 30.0000 mg | ORAL_TABLET | Freq: Every day | ORAL | 0 refills | Status: AC

## 2021-09-20 MED ORDER — WARFARIN SODIUM 5 MG PO TABS
10.0000 mg | ORAL_TABLET | Freq: Once | ORAL | Status: AC
Start: 2021-09-20 — End: 2021-09-20
  Administered 2021-09-20: 10 mg via ORAL
  Filled 2021-09-20: qty 2

## 2021-09-20 NOTE — Progress Notes (Signed)
09/20/2021 5:56 PM  T/C from pharmacy.  Duoneb not covered by patient's insurance.  Changed to albuterol nebs. E-scribed to [place]. Wynetta Emery, MD

## 2021-09-20 NOTE — TOC Initial Note (Addendum)
Transition of Care [facility]) - Initial/Assessment Note    Patient Details  Name: Jesus Rodriguez MRN: [id number] Date of Birth: May 04, 1950  Transition of Care [facility]) CM/SW Contact:    Boneta Lucks, RN Phone Number: 09/20/2021, 12:39 PM  Clinical Narrative:         Patient admitted with acute on chronic respiratory failure. TOC at the bedside, consulted for CHF. Patient states this is not new. He is agreeable to HHRN/PT.  MD aware to order. Georgina Snell accepted the referral. Patient is on 3L home oxygen with [employer]. Patient states [employer] has been asking for a new Order. MD made aware of oxygen order needed.   Addendum : TOC consulted for PCP,  Patient is agreeable to see NP at [facility].  Appointment made and added to AVS.          Expected Discharge Plan: [place] Barriers to Discharge: Continued Medical Work up  Patient Goals and CMS Choice Patient states their goals for this hospitalization and ongoing recovery are:: to go home. CMS Medicare.gov Compare Post Acute Care list provided to:: Patient Choice offered to / list presented to : Patient  Expected Discharge Plan and Services Expected Discharge Plan: [place] Arranged: PT, RN [facility] Agency: [place] Date [facility] Agency Contacted: 09/20/21 Time [place] Agency Contacted: [phone] Representative spoke with at [place]: Georgina Snell  Prior Living Arrangements/Services     Patient language and need for interpreter reviewed:: Yes Do you feel safe going back to the place where you live?: Yes      Need for Family Participation in Patient Care: Yes (Comment) Care giver support system in place?: Yes (comment)   Criminal Activity/Legal Involvement Pertinent to Current Situation/Hospitalization: No - Comment as needed  Activities of Daily Living Home Assistive Devices/Equipment: Grab bars in shower, Grab bars around toilet, Walker (specify type) ADL Screening (condition at time of  admission) Patient's cognitive ability adequate to safely complete daily activities?: Yes Is the patient deaf or have difficulty hearing?: No Does the patient have difficulty seeing, even when wearing glasses/contacts?: No Does the patient have difficulty concentrating, remembering, or making decisions?: No Patient able to express need for assistance with ADLs?: Yes Does the patient have difficulty dressing or bathing?: No Independently performs ADLs?: Yes (appropriate for developmental age) Does the patient have difficulty walking or climbing stairs?: Yes Weakness of Legs: Both Weakness of Arms/Hands: None  Permission Sought/Granted    Emotional Assessment    Affect (typically observed): Accepting Orientation: : Oriented to  Time, Oriented to Self, Oriented to Place, Oriented to Situation Alcohol / Substance Use: Not Applicable Psych Involvement: No (comment)  Admission diagnosis:  Respiratory distress [R06.03] Acute on chronic diastolic CHF (congestive heart failure) (HCC) [I50.33] Patient Active Problem List   Diagnosis Date Noted   Acute on chronic diastolic CHF (congestive heart failure) ([place]) 09/17/2021   Acute on chronic respiratory failure with hypoxia and hypercarbia 09/17/2021   Mixed hyperlipidemia 09/17/2021   Hypotension 11/25/2020   AKI (acute kidney injury) ([place]) 11/24/2020   Chronic respiratory failure with hypoxia (HCC) 11/24/2020   Chronic diastolic CHF (congestive heart failure) ([place]) 11/24/2020   Tobacco use disorder 11/21/2020   Mild protein-calorie malnutrition ([place]) 11/21/2020   SBO (small bowel obstruction) ([place]) 11/20/2020   Obesity, Class III, BMI 40-49.9 (morbid obesity) ([place])    COPD with acute exacerbation (HCC)    Acute on chronic diastolic HF (heart failure) ([place])  Pressure injury of skin 11/28/2019   Acute respiratory failure with hypoxia (HCC) 11/27/2019   Atrial fibrillation ([place]) 05/11/2018   Encounter for therapeutic drug monitoring  04/22/2018   Persistent atrial fibrillation ([place]) 04/08/2018   History of rheumatoid arthritis seronegative 09/10/2017   High risk medications (not anticoagulants) long-term use 02/14/2016   Class 3 obesity in adult 02/14/2016   Depression 02/14/2016   Osteoarthritis of both hands 02/13/2016   Osteoarthritis of both knees 02/13/2016   DDD (degenerative disc disease), lumbar 02/13/2016   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: [phone]: [phone]  Readmission Risk Interventions    09/20/2021   12:37 PM 11/27/2020    1:12 PM 11/30/2019   12:28 PM  Readmission Risk Prevention Plan  Transportation Screening Complete Complete Complete  PCP or Specialist Appt within 5-7 Days Not Complete Not Complete   Home Care Screening Complete Complete Complete  Medication Review (RN CM) Complete Complete Complete

## 2021-09-20 NOTE — Progress Notes (Signed)
ANTICOAGULATION CONSULT NOTE -  Pharmacy Consult for warfarin Indication: atrial fibrillation  Allergies  Allergen Reactions   Celebrex [Celecoxib]    Methotrexate Derivatives Other (See Comments)    Arrhythmia    Sulfa Antibiotics     Patient Measurements: Height: '6\' 1"'$  (185.4 cm) Weight: (!) 173.4 kg (382 lb 4.4 oz) IBW/kg (Calculated) : 79.9 Heparin Dosing Weight:   Vital Signs: Temp: 97.9 F (36.6 C) (06/09 0400) Temp Source: Axillary (06/09 0400) BP: 146/66 (06/09 0500) Pulse Rate: 71 (06/09 0500)  Labs: Recent Labs    09/17/21 0857 09/17/21 1230 09/18/21 0337 09/19/21 0419 09/20/21 0410  HGB 12.4*  --   --  12.3*  --   HCT 40.0  --   --  40.2  --   PLT 192  --   --  202  --   LABPROT  --    < > 23.5* 20.1* 19.3*  INR  --    < > 2.1* 1.7* 1.7*  CREATININE 0.80  --  0.79 0.82 0.77   < > = values in this interval not displayed.     Estimated Creatinine Clearance: 140.5 mL/min (by C-G formula based on SCr of 0.77 mg/dL).   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   unk   albuterol (VENTOLIN HFA) 108 (90 Base) MCG/ACT inhaler Inhale 1 puff into the lungs every 6 (six) hours as needed.   unk   benazepril (LOTENSIN) 10 MG tablet Take 1 tablet (10 mg total) by mouth daily. 90 tablet 3 09/16/2021   doxycycline (VIBRAMYCIN)  100 MG capsule Take 100 mg by mouth daily.   09/16/2021   ketoconazole (NIZORAL) 2 % cream Apply 1 application topically 2 (two) times daily as needed for irritation.   09/16/2021   Metoprolol Tartrate 75 MG TABS TAKE 1 TABLET BY MOUTH TWICE A DAY 180 tablet 2 09/17/2021 at 0430   simvastatin (ZOCOR) 20 MG tablet TAKE 1 TABLET BY MOUTH DAILY AT 6 PM. 90 tablet 1 09/16/2021   torsemide (DEMADEX) 20 MG tablet Take 3 tablets (60 mg total) by mouth 2 (two) times daily. 540 tablet 0 09/16/2021   TRELEGY ELLIPTA 100-62.5-25 MCG/INH AEPB Inhale 1 puff into the lungs daily.   09/16/2021   triamcinolone (KENALOG) 0.1 % Apply 1 application topically 3 (three) times daily.   09/16/2021   warfarin (COUMADIN) 10 MG tablet TAKE 1/2 TO 1 TABLET DAILY AS DIRECTED BY COUMADIN CLINIC. (Patient taking differently: Take 5-10 mg by mouth daily. Tue thur and Sunday take '5mg'$  and mon, wed, fri, and sat take '10mg'$ ) 90 tablet 3 09/15/2021 at 2100   benazepril (LOTENSIN) 10 MG tablet Take 1 tablet (10 mg total) by mouth daily. (Patient not taking: Reported on 09/17/2021) 30 tablet 6 Not Taking    Assessment: Pharmacy consulted to dose warfarin  in patient with atrial fibrillation. INR on admission is therapeutic at 2.2 with last dosze 6/4 2100.  Home dose listed as 5 mg on Tue-Thur-Sun and 10 mg ROW  INR 2.1 > 1.7> 1.7  Goal of Therapy:  INR 2-3 Monitor platelets by anticoagulation protocol: Yes   Plan:  Warfarin 10 mg x 1 dose Monitor daily INR and s/s of bleeding  Margot Ables, PharmD Clinical Pharmacist 09/20/2021 8:09 AM

## 2021-09-20 NOTE — Discharge Summary (Signed)
Physician Discharge Summary  Jesus Rodriguez WCB:[id number] DOB: 11/06/1950 DOA: 09/17/2021  PCP: [facility] [place] [facility]: [facility]: Karalee Height   Admit date: 09/17/2021 Discharge date: 09/20/2021  Admitted From:  Home  Disposition: Home with [facility]   Recommendations for Outpatient Follow-up:  Follow up with PCP in 1-2 weeks  Please follow up with cardiology on 10/23/21 as scheduled Please follow up with [facility] as scheduled Please obtain BMP/CBC in 2 weeks to follow electrolytes   Home Health: RN PT   Discharge Condition: STABLE  CODE STATUS: FULL DIET: 2 GRAM SODIUM    Brief Hospitalization Summary: Please see all hospital notes, images, labs for full details of the hospitalization. 71 year old male with a history of hypertension, hyperlipidemia, COPD, diastolic CHF, atrial fibrillation, rheumatoid arthritis, depression, scrotal carcinoma presenting with 3-day history of shortness of breath and increasing abdominal girth.  The patient is chronically on 3 L nasal cannula at home.  The patient also describes orthopnea type symptoms.  He states that his leg edema is about the same as usual.  He denies any fevers, chills, hemoptysis, abdominal pain, nausea, vomiting, diarrhea.  He continues to smoke 1 pack/day.  Because of his continued shortness of breath, the patient presented for further evaluation and treatment.  In the ED, the patient was afebrile hemodynamically stable with oxygen saturation 95% on 6 L.  WBC 7.5, hemoglobin 12.4, platelets 192,000.  BMP showed sodium 143, potassium 3.0, bicarbonate greater than 45, serum creatinine 0.80.  EKG showed atrial fibrillation with nonspecific ST/T wave changes.  Chest x-ray showed pulmonary edema and bilateral pleural effusions.  INR was 2.0.  COVID PCR was negative.  I ordered for the patient to receive 80 mg IV Lasix.  He had received albuterol nebulizer and Solu-Medrol  125 mg IV.  HOSPITAL COURSE BY PROBLEM   Assessment and Plan: * Acute on chronic respiratory failure with hypoxia and hypercarbia Secondary to pulmonary edema and COPD exacerbation He is much better today.  He is feeling back to baseline and wants to go home I spoke with pulmonary Dr. Halford Chessman and ok for patient to discharge home today.  Supplemental oxygen back to baseline Pt presented with very high pCO2 level and I have requested inpatient pulmonary consultation  Pt has a home CPAP and bipap but not using the bipap.  I encouraged him to use his equipment regularly.   Pt was treated with yupelri, pulmicort, brovana nebs in hospital Plan is for him to resume trelegy after discharge He will follow up with [facility].    Mixed hyperlipidemia Continue statin  Acute on chronic diastolic CHF (congestive heart failure) ([place]) Initially treated with furosemide 80 mg IV twice daily and now reduced to 60 mg daily  Daily weights Accurate I's and O's Update echocardiogram 11/26/2020 echo EF 55 to 60%, trivial MR, moderate PASP elevation I/O last 3 completed shifts: In: 480 [P.O.:480] Out: 2000 [Urine:2000] Total I/O In: 3 [I.V.:3] Out: 2350 [Urine:2350]  Filed Weights   09/18/21 0417 09/19/21 0500 09/20/21 0700  Weight: (!) 172.9 kg (!) 173.4 kg (!) 173.4 kg   Pt can resume his oral torsemide 60 mg BID with potassium supplement.  He needs to take it regularly and he should do very well.  Unfortunately compliance has been a long-term issue   He already has outpatient follow up with cardiology clinic on 10/23/21 with B. Strader.     Tobacco use disorder Tobacco cessation discussed Nicotine patch ordered  COPD with acute exacerbation ([place]) Continue pulmicort, brovana, yupelri nebs per pulmonology team  Continue IV Solu-Medrol  Obesity, Class III, BMI 40-49.9 (morbid obesity) (HCC) BMI>40 Lifestyle modification  Atrial fibrillation (HCC) Continue warfarin Continue  metoprolol heart rate Currently rate controlled  Hypertension Continue metoprolol for blood pressure management.   Discharge Diagnoses:  Principal Problem:   Acute on chronic respiratory failure with hypoxia and hypercarbia Active Problems:   Hypertension   Atrial fibrillation (HCC)   Obesity, Class III, BMI 40-49.9 (morbid obesity) ([place])   COPD with acute exacerbation (HCC)   Tobacco use disorder   Acute on chronic diastolic CHF (congestive heart failure) ([place])   Mixed hyperlipidemia   Discharge Instructions:  Allergies as of 09/20/2021       Reactions   Celebrex [celecoxib]    Methotrexate Derivatives Other (See Comments)   Arrhythmia    Sulfa Antibiotics         Medication List     STOP taking these medications    doxycycline 100 MG capsule Commonly known as: VIBRAMYCIN       TAKE these medications    acetaminophen 500 MG tablet Commonly known as: TYLENOL Take 500 mg by mouth every 6 (six) hours as needed.   albuterol 108 (90 Base) MCG/ACT inhaler Commonly known as: VENTOLIN HFA Inhale 1 puff into the lungs every 6 (six) hours as needed.   benazepril 10 MG tablet Commonly known as: LOTENSIN Take 1 tablet (10 mg total) by mouth daily. What changed: Another medication with the same name was removed. Continue taking this medication, and follow the directions you see here.   ipratropium-albuterol 0.5-2.5 (3) MG/3ML Soln Commonly known as: DUONEB Take 3 mLs by nebulization every 4 (four) hours as needed (wheezing, coughing, shortness of breath).   ketoconazole 2 % cream Commonly known as: NIZORAL Apply 1 application topically 2 (two) times daily as needed for irritation.   Metoprolol Tartrate 75 MG Tabs TAKE 1 TABLET BY MOUTH TWICE A DAY   potassium chloride SA 20 MEQ tablet Commonly known as: KLOR-CON M Take 1 tablet (20 mEq total) by mouth daily. Start taking on: September 21, 2021   predniSONE 10 MG tablet Commonly known as: DELTASONE Take 3  tablets (30 mg total) by mouth daily with breakfast for 5 days. Start taking on: September 21, 2021   simvastatin 20 MG tablet Commonly known as: ZOCOR TAKE 1 TABLET BY MOUTH DAILY AT 6 PM.   torsemide 20 MG tablet Commonly known as: DEMADEX Take 3 tablets (60 mg total) by mouth 2 (two) times daily.   Trelegy Ellipta 100-62.5-25 MCG/ACT Aepb Generic drug: Fluticasone-Umeclidin-Vilant Inhale 1 puff into the lungs daily.   triamcinolone cream 0.1 % Commonly known as: KENALOG Apply 1 application topically 3 (three) times daily.   warfarin 10 MG tablet Commonly known as: COUMADIN Take as directed. If you are unsure how to take this medication, talk to your nurse or doctor. Original instructions: Take 0.5-1 tablets (5-10 mg total) by mouth daily. Tue thur and Sunday take '5mg'$  and mon, wed, fri, and sat take '10mg'$  What changed: See the new instructions.               Durable Medical Equipment  (From admission, onward)           Start     Ordered   09/20/21 1550  For home use only DME Nebulizer machine  Once       Question Answer Comment  Patient needs a nebulizer  to treat with the following condition COPD (chronic obstructive pulmonary disease) ([place])   Length of Need Lifetime      09/20/21 1549            Follow-up Information     [facility] Follow up.   Specialty: [place] Why: PT/RN will call to schedule your first home visit. Contact information: [address]  [place] [address] [phone]         [facility] Follow up.   Why: Home Oxygen Contact information: [address] [place] [address] [phone]         Alvira Monday, [place]. Go to.   Specialty: Family Medicine Why: June 16th 2:40 Contact information: [address] [place] [address] [phone]         [facility]. Schedule an appointment as soon as possible for a visit in 2 week(s).   Specialty:  Pulmonology Why: Hospital Follow Up Contact information: [address] [place] [address] [phone]               Allergies  Allergen Reactions   Celebrex [Celecoxib]    Methotrexate Derivatives Other (See Comments)    Arrhythmia    Sulfa Antibiotics    Allergies as of 09/20/2021       Reactions   Celebrex [celecoxib]    Methotrexate Derivatives Other (See Comments)   Arrhythmia    Sulfa Antibiotics         Medication List     STOP taking these medications    doxycycline 100 MG capsule Commonly known as: VIBRAMYCIN       TAKE these medications    acetaminophen 500 MG tablet Commonly known as: TYLENOL Take 500 mg by mouth every 6 (six) hours as needed.   albuterol 108 (90 Base) MCG/ACT inhaler Commonly known as: VENTOLIN HFA Inhale 1 puff into the lungs every 6 (six) hours as needed.   benazepril 10 MG tablet Commonly known as: LOTENSIN Take 1 tablet (10 mg total) by mouth daily. What changed: Another medication with the same name was removed. Continue taking this medication, and follow the directions you see here.   ipratropium-albuterol 0.5-2.5 (3) MG/3ML Soln Commonly known as: DUONEB Take 3 mLs by nebulization every 4 (four) hours as needed (wheezing, coughing, shortness of breath).   ketoconazole 2 % cream Commonly known as: NIZORAL Apply 1 application topically 2 (two) times daily as needed for irritation.   Metoprolol Tartrate 75 MG Tabs TAKE 1 TABLET BY MOUTH TWICE A DAY   potassium chloride SA 20 MEQ tablet Commonly known as: KLOR-CON M Take 1 tablet (20 mEq total) by mouth daily. Start taking on: September 21, 2021   predniSONE 10 MG tablet Commonly known as: DELTASONE Take 3 tablets (30 mg total) by mouth daily with breakfast for 5 days. Start taking on: September 21, 2021   simvastatin 20 MG tablet Commonly known as: ZOCOR TAKE 1 TABLET BY MOUTH DAILY AT 6 PM.   torsemide 20 MG tablet Commonly  known as: DEMADEX Take 3 tablets (60 mg total) by mouth 2 (two) times daily.   Trelegy Ellipta 100-62.5-25 MCG/ACT Aepb Generic drug: Fluticasone-Umeclidin-Vilant Inhale 1 puff into the lungs daily.   triamcinolone cream 0.1 % Commonly known as: KENALOG Apply 1 application topically 3 (three) times daily.   warfarin 10 MG tablet Commonly known as: COUMADIN Take as directed. If you are unsure how to take this  medication, talk to your nurse or doctor. Original instructions: Take 0.5-1 tablets (5-10 mg total) by mouth daily. Tue thur and 'Sunday take 5mg and mon, wed, fri, and sat take 10mg What changed: See the new instructions.               Durable Medical Equipment  (From admission, onward)           Start     Ordered   09/20/21 1550  For home use only DME Nebulizer machine  Once       Question Answer Comment  Patient needs a nebulizer to treat with the following condition COPD (chronic obstructive pulmonary disease) (HCC)   Length of Need Lifetime      09/20/21 1549            Procedures/Studies: DG Chest Port 1 View  Result Date: 09/19/2021 CLINICAL DATA:  Shortness of breath. EXAM: PORTABLE CHEST 1 VIEW COMPARISON:  Chest radiograph date 01/12/2021 and CT dated 11/27/2019. FINDINGS: Shallow inspiration. Bilateral lower lung field opacities progressed since the prior radiograph, possibly combination of small pleural effusions and associated atelectasis or infiltrate. No pneumothorax. Stable cardiomegaly. No acute osseous pathology. IMPRESSION: Probable small bilateral pleural effusions and associated atelectasis or infiltrate, progressed since the prior radiograph. Electronically Signed   By: Arash  Radparvar M.D.   On: 09/19/2021 01:06   ECHOCARDIOGRAM COMPLETE  Result Date: 09/18/2021    ECHOCARDIOGRAM REPORT   Patient Name:   Jesus Rodriguez Date of Exam: 09/18/2021 Medical Rec #:  [id number]      Height:       73.0 in Accession #:    [id number]     Weight:        381.2 lb Date of Birth:  11/25/1950      BSA:          2.830 m Patient Age:    71 years       BP:           97/48 mmHg Patient Gender: M              HR:           80'$  bpm. Exam Location:  [facility] Procedure: 2D Echo, Cardiac Doppler and Color Doppler Indications:    CHF  History:        Patient has prior history of Echocardiogram examinations, most                 recent 11/26/2020. CHF, COPD, Arrythmias:Atrial Fibrillation;                 Risk Factors:Hypertension, Dyslipidemia and Current Smoker.                 Patient is sitting for the echo due to SOB and is continually                 coughing.  Sonographer:    Wenda Low Referring Phys: [id number] DAVID TAT  Sonographer Comments: Patient is morbidly obese. Image acquisition challenging due to COPD and Image acquisition challenging due to respiratory motion. IMPRESSIONS  1. Abnormal septal motion . Left ventricular ejection fraction, by estimation, is 55 to 60%. The left ventricle has normal function. The left ventricle has no regional wall motion abnormalities. There is mild left ventricular hypertrophy. Left ventricular diastolic parameters are indeterminate.  2. Right ventricular systolic function is normal. The right ventricular size is normal. There is normal pulmonary artery systolic pressure.  3. Right atrial size  was mildly dilated.  4. The mitral valve is abnormal. Trivial mitral valve regurgitation. No evidence of mitral stenosis.  5. The aortic valve is tricuspid. There is moderate calcification of the aortic valve. There is moderate thickening of the aortic valve. Aortic valve regurgitation is not visualized. Aortic valve sclerosis/calcification is present, without any evidence of aortic stenosis.  6. Aortic dilatation noted. There is mild dilatation of the aortic root, measuring 40 mm.  7. The inferior vena cava is dilated in size with >50% respiratory variability, suggesting right atrial pressure of 8 mmHg. FINDINGS  Left Ventricle: Abnormal  septal motion. Left ventricular ejection fraction, by estimation, is 55 to 60%. The left ventricle has normal function. The left ventricle has no regional wall motion abnormalities. The left ventricular internal cavity size was normal in size. There is mild left ventricular hypertrophy. Left ventricular diastolic parameters are indeterminate. Right Ventricle: The right ventricular size is normal. No increase in right ventricular wall thickness. Right ventricular systolic function is normal. There is normal pulmonary artery systolic pressure. The tricuspid regurgitant velocity is 2.04 m/s, and  with an assumed right atrial pressure of 8 mmHg, the estimated right ventricular systolic pressure is 96.2 mmHg. Left Atrium: Left atrial size was normal in size. Right Atrium: Right atrial size was mildly dilated. Pericardium: There is no evidence of pericardial effusion. Mitral Valve: The mitral valve is abnormal. There is mild thickening of the mitral valve leaflet(s). There is moderate calcification of the mitral valve leaflet(s). Trivial mitral valve regurgitation. No evidence of mitral valve stenosis. MV peak gradient, 9.5 mmHg. The mean mitral valve gradient is 5.0 mmHg. Tricuspid Valve: The tricuspid valve is normal in structure. Tricuspid valve regurgitation is trivial. No evidence of tricuspid stenosis. Aortic Valve: The aortic valve is tricuspid. There is moderate calcification of the aortic valve. There is moderate thickening of the aortic valve. Aortic valve regurgitation is not visualized. Aortic valve sclerosis/calcification is present, without any  evidence of aortic stenosis. Aortic valve mean gradient measures 4.0 mmHg. Aortic valve peak gradient measures 8.5 mmHg. Aortic valve area, by VTI measures 2.55 cm. Pulmonic Valve: The pulmonic valve was normal in structure. Pulmonic valve regurgitation is not visualized. No evidence of pulmonic stenosis. Aorta: Aortic dilatation noted. There is mild dilatation of  the aortic root, measuring 40 mm. Venous: The inferior vena cava is dilated in size with greater than 50% respiratory variability, suggesting right atrial pressure of 8 mmHg. IAS/Shunts: No atrial level shunt detected by color flow Doppler.  LEFT VENTRICLE PLAX 2D LVIDd:         5.90 cm   Diastology LVIDs:         3.60 cm   LV e' medial:    9.90 cm/s LV PW:         1.20 cm   LV E/e' medial:  10.2 LV IVS:        1.30 cm   LV e' lateral:   14.10 cm/s LVOT diam:     2.10 cm   LV E/e' lateral: 7.2 LV SV:         71 LV SV Index:   25 LVOT Area:     3.46 cm  RIGHT VENTRICLE RV Basal diam:  4.30 cm RV Mid diam:    4.50 cm TAPSE (M-mode): 3.2 cm LEFT ATRIUM            Index        RIGHT ATRIUM  Index LA diam:      4.40 cm  1.55 cm/m   RA Area:     31.90 cm LA Vol (A4C): 123.0 ml 43.46 ml/m  RA Volume:   119.00 ml 42.05 ml/m  AORTIC VALVE                    PULMONIC VALVE AV Area (Vmax):    2.59 cm     PV Vmax:       0.75 m/s AV Area (Vmean):   2.71 cm     PV Peak grad:  2.3 mmHg AV Area (VTI):     2.55 cm AV Vmax:           146.00 cm/s AV Vmean:          90.500 cm/s AV VTI:            0.280 m AV Peak Grad:      8.5 mmHg AV Mean Grad:      4.0 mmHg LVOT Vmax:         109.00 cm/s LVOT Vmean:        70.900 cm/s LVOT VTI:          0.206 m LVOT/AV VTI ratio: 0.74  AORTA Ao Root diam: 4.00 cm Ao Asc diam:  3.60 cm MITRAL VALVE                TRICUSPID VALVE MV Area (PHT): 4.26 cm     TR Peak grad:   16.6 mmHg MV Area VTI:   2.14 cm     TR Vmax:        204.00 cm/s MV Peak grad:  9.5 mmHg MV Mean grad:  5.0 mmHg     SHUNTS MV Vmax:       1.54 m/s     Systemic VTI:  0.21 m MV Vmean:      96.1 cm/s    Systemic Diam: 2.10 cm MV Decel Time: 178 msec MV E velocity: 101.00 cm/s Jenkins Rouge MD Electronically signed by Jenkins Rouge MD Signature Date/Time: 09/18/2021/11:40:06 AM    Final    DG Chest Port 1 View  Result Date: 09/17/2021 CLINICAL DATA:  Shortness of breath EXAM: PORTABLE CHEST 1 VIEW COMPARISON:   01/12/2021 FINDINGS: Stable cardiomegaly. Low lung volumes. Layering bilateral pleural effusions with associated bibasilar opacities. Additional bilateral perihilar opacities. Diffuse interstitial prominence. No pneumothorax. IMPRESSION: Findings suggestive of CHF with pulmonary edema and layering bilateral pleural effusions. Superimposed infectious process would be difficult to exclude. Electronically Signed   By: Davina Poke D.O.   On: 09/17/2021 09:19     Subjective: Pt sitting up in chair, says he feels much better, he is urinating frequently on IV lasix.   Discharge Exam: Vitals:   09/20/21 0900 09/20/21 1100  BP: 121/71   Pulse: 98   Resp: 14   Temp:  97.6 F (36.4 C)  SpO2: 92%    Vitals:   09/20/21 0808 09/20/21 0836 09/20/21 0900 09/20/21 1100  BP:  (!) 119/54 121/71   Pulse:  92 98   Resp:  20 14   Temp: (!) 97.1 F (36.2 C)   97.6 F (36.4 C)  TempSrc: Axillary   Oral  SpO2:  (!) 87% 92%   Weight:      Height:       General exam: Appears chronically ill, he appears older than stated age, he is on [place], normal mentation. He is morbidly obese.  Respiratory system: much  better bilateral air movement, no rales heard. Very rare exp wheeze heard.    Cardiovascular system: normal S1 & S2 heard. No JVD, murmurs, rubs, gallops or clicks. No pedal edema. Gastrointestinal system: Abdomen is morbidly obese, soft and nontender. No organomegaly or masses felt. Normal bowel sounds heard. Central nervous system: Alert and oriented. No focal neurological deficits. Extremities: 1+ edema BLEs.  Symmetric 5 x 5 power. Skin: No rashes, lesions or ulcers. Psychiatry: Judgement and insight appear poor. Mood & affect appropriate.    The results of significant diagnostics from this hospitalization (including imaging, microbiology, ancillary and laboratory) are listed below for reference.     Microbiology: Recent Results (from the past 240 hour(s))  Resp Panel by RT-PCR (Flu A&B, Covid)  Anterior Nasal Swab     Status: None   Collection Time: 09/17/21  8:57 AM   Specimen: Anterior Nasal Swab  Result Value Ref Range Status   SARS Coronavirus 2 by RT PCR NEGATIVE NEGATIVE Final    Comment: (NOTE) SARS-CoV-2 target nucleic acids are NOT DETECTED.  The SARS-CoV-2 RNA is generally detectable in upper respiratory specimens during the acute phase of infection. The lowest concentration of SARS-CoV-2 viral copies this assay can detect is 138 copies/mL. A negative result does not preclude SARS-Cov-2 infection and should not be used as the sole basis for treatment or other patient management decisions. A negative result may occur with  improper specimen collection/handling, submission of specimen other than nasopharyngeal swab, presence of viral mutation(s) within the areas targeted by this assay, and inadequate number of viral copies(<138 copies/mL). A negative result must be combined with clinical observations, patient history, and epidemiological information. The expected result is Negative.  Fact Sheet for Patients:  EntrepreneurPulse.com.au  Fact Sheet for Healthcare Providers:  IncredibleEmployment.be  This test is no t yet approved or cleared by the [place] FDA and  has been authorized for detection and/or diagnosis of SARS-CoV-2 by FDA under an Emergency Use Authorization (EUA). This EUA will remain  in effect (meaning this test can be used) for the duration of the COVID-19 declaration under Section 564(b)(1) of the Act, 21 U.S.C.section 360bbb-3(b)(1), unless the authorization is terminated  or revoked sooner.       Influenza A by PCR NEGATIVE NEGATIVE Final   Influenza B by PCR NEGATIVE NEGATIVE Final    Comment: (NOTE) The Xpert Xpress SARS-CoV-2/FLU/RSV plus assay is intended as an aid in the diagnosis of influenza from Nasopharyngeal swab specimens and should not be used as a sole basis for treatment. Nasal washings  and aspirates are unacceptable for Xpert Xpress SARS-CoV-2/FLU/RSV testing.  Fact Sheet for Patients: EntrepreneurPulse.com.au  Fact Sheet for Healthcare Providers: IncredibleEmployment.be  This test is not yet approved or cleared by the [place] FDA and has been authorized for detection and/or diagnosis of SARS-CoV-2 by FDA under an Emergency Use Authorization (EUA). This EUA will remain in effect (meaning this test can be used) for the duration of the COVID-19 declaration under Section 564(b)(1) of the Act, 21 U.S.C. section 360bbb-3(b)(1), unless the authorization is terminated or revoked.  Performed at [facility], [address] [place], [place] [address]   MRSA Next Gen by PCR, Nasal     Status: None   Collection Time: 09/18/21 12:56 PM   Specimen: Nasal Mucosa; Nasal Swab  Result Value Ref Range Status   MRSA by PCR Next Gen NOT DETECTED NOT DETECTED Final    Comment: (NOTE) The GeneXpert MRSA Assay (FDA approved for NASAL specimens  only), is one component of a comprehensive MRSA colonization surveillance program. It is not intended to diagnose MRSA infection nor to guide or monitor treatment for MRSA infections. Test performance is not FDA approved in patients less than 38 years old. Performed at [facility], [address], [place], [place] [address]      Labs: BNP (last 3 results) Recent Labs    11/24/20 0610 09/17/21 0858  BNP 164.0* 592.9*   Basic Metabolic Panel: Recent Labs  Lab 09/17/21 0857 09/18/21 0337 09/19/21 0419 09/20/21 0410  NA 143 142 140 140  K 3.0* 3.3* 4.0 4.3  CL 85* 88* 86* 90*  CO2 >45* <45* 43* 43*  GLUCOSE 109* 154* 165* 204*  BUN '16 20 22 '$ 25*  CREATININE 0.80 0.79 0.82 0.77  CALCIUM 9.1 8.8* 9.0 9.1  MG  --   --  2.5*  --    Liver Function Tests: Recent Labs  Lab 09/17/21 0857  AST 17  ALT 11  ALKPHOS 98  BILITOT 0.9  PROT 7.1  ALBUMIN 3.2*   No results for input(s):  "LIPASE", "AMYLASE" in the last 168 hours. No results for input(s): "AMMONIA" in the last 168 hours. CBC: Recent Labs  Lab 09/17/21 0857 09/19/21 0419  WBC 7.5 8.5  NEUTROABS 5.5  --   HGB 12.4* 12.3*  HCT 40.0 40.2  MCV 96.6 97.1  PLT 192 202   Cardiac Enzymes: No results for input(s): "CKTOTAL", "CKMB", "CKMBINDEX", "TROPONINI" in the last 168 hours. BNP: Invalid input(s): "POCBNP" CBG: No results for input(s): "GLUCAP" in the last 168 hours. D-Dimer No results for input(s): "DDIMER" in the last 72 hours. Hgb A1c No results for input(s): "HGBA1C" in the last 72 hours. Lipid Profile No results for input(s): "CHOL", "HDL", "LDLCALC", "TRIG", "CHOLHDL", "LDLDIRECT" in the last 72 hours. Thyroid function studies No results for input(s): "TSH", "T4TOTAL", "T3FREE", "THYROIDAB" in the last 72 hours.  Invalid input(s): "FREET3" Anemia work up No results for input(s): "VITAMINB12", "FOLATE", "FERRITIN", "TIBC", "IRON", "RETICCTPCT" in the last 72 hours. Urinalysis    Component Value Date/Time   COLORURINE YELLOW 11/26/2020 1200   APPEARANCEUR CLEAR 11/26/2020 1200   LABSPEC 1.017 11/26/2020 1200   PHURINE 5.0 11/26/2020 1200   GLUCOSEU NEGATIVE 11/26/2020 1200   HGBUR NEGATIVE 11/26/2020 1200   BILIRUBINUR NEGATIVE 11/26/2020 1200   KETONESUR NEGATIVE 11/26/2020 1200   PROTEINUR NEGATIVE 11/26/2020 1200   NITRITE NEGATIVE 11/26/2020 1200   [place] 11/26/2020 1200   Sepsis Labs Recent Labs  Lab 09/17/21 0857 09/19/21 0419  WBC 7.5 8.5   Microbiology Recent Results (from the past 240 hour(s))  Resp Panel by RT-PCR (Flu A&B, Covid) Anterior Nasal Swab     Status: None   Collection Time: 09/17/21  8:57 AM   Specimen: Anterior Nasal Swab  Result Value Ref Range Status   SARS Coronavirus 2 by RT PCR NEGATIVE NEGATIVE Final    Comment: (NOTE) SARS-CoV-2 target nucleic acids are NOT DETECTED.  The SARS-CoV-2 RNA is generally detectable in upper  respiratory specimens during the acute phase of infection. The lowest concentration of SARS-CoV-2 viral copies this assay can detect is 138 copies/mL. A negative result does not preclude SARS-Cov-2 infection and should not be used as the sole basis for treatment or other patient management decisions. A negative result may occur with  improper specimen collection/handling, submission of specimen other than nasopharyngeal swab, presence of viral mutation(s) within the areas targeted by this assay, and inadequate number of viral copies(<138 copies/mL).  A negative result must be combined with clinical observations, patient history, and epidemiological information. The expected result is Negative.  Fact Sheet for Patients:  EntrepreneurPulse.com.au  Fact Sheet for Healthcare Providers:  IncredibleEmployment.be  This test is no t yet approved or cleared by the [place] FDA and  has been authorized for detection and/or diagnosis of SARS-CoV-2 by FDA under an Emergency Use Authorization (EUA). This EUA will remain  in effect (meaning this test can be used) for the duration of the COVID-19 declaration under Section 564(b)(1) of the Act, 21 U.S.C.section 360bbb-3(b)(1), unless the authorization is terminated  or revoked sooner.       Influenza A by PCR NEGATIVE NEGATIVE Final   Influenza B by PCR NEGATIVE NEGATIVE Final    Comment: (NOTE) The Xpert Xpress SARS-CoV-2/FLU/RSV plus assay is intended as an aid in the diagnosis of influenza from Nasopharyngeal swab specimens and should not be used as a sole basis for treatment. Nasal washings and aspirates are unacceptable for Xpert Xpress SARS-CoV-2/FLU/RSV testing.  Fact Sheet for Patients: EntrepreneurPulse.com.au  Fact Sheet for Healthcare Providers: IncredibleEmployment.be  This test is not yet approved or cleared by the [place] FDA and has been  authorized for detection and/or diagnosis of SARS-CoV-2 by FDA under an Emergency Use Authorization (EUA). This EUA will remain in effect (meaning this test can be used) for the duration of the COVID-19 declaration under Section 564(b)(1) of the Act, 21 U.S.C. section 360bbb-3(b)(1), unless the authorization is terminated or revoked.  Performed at [facility], [address] [place], [place] [address]   MRSA Next Gen by PCR, Nasal     Status: None   Collection Time: 09/18/21 12:56 PM   Specimen: Nasal Mucosa; Nasal Swab  Result Value Ref Range Status   MRSA by PCR Next Gen NOT DETECTED NOT DETECTED Final    Comment: (NOTE) The GeneXpert MRSA Assay (FDA approved for NASAL specimens only), is one component of a comprehensive MRSA colonization surveillance program. It is not intended to diagnose MRSA infection nor to guide or monitor treatment for MRSA infections. Test performance is not FDA approved in patients less than 45 years old. Performed at [facility], [address], [place], [place] [address]     Time coordinating discharge: 41 mins  SIGNED:  Irwin Brakeman, MD  [facility] 09/20/2021, 3:59 PM How to contact the [facility] Attending or Consulting provider [place] or covering provider during after hours [place], for this patient?  Check the care team in [facility] and look for a) attending/consulting TRH provider listed and b) the [facility] team listed Log into [email or web address] and use [place]'s universal password to access. If you do not have the password, please contact the hospital operator. Locate the [facility] provider you are looking for under [facility] and page to a number that you can be directly reached. If you still have difficulty reaching the provider, please page the [facility] (Director on Call) for the Hospitalists listed on amion for assistance.

## 2021-09-20 NOTE — Care Management Important Message (Signed)
Important Message  Patient Details  Name: Jesus Rodriguez MRN: [id number] Date of Birth: 09/20/1950   Medicare Important Message Given:  Yes  Reviewed Medicare IM with patient via cell phone [phone]).  Copy of Medicare IM placed in mail to home address on file.    [name] 09/20/2021, 2:21 PM

## 2021-09-20 NOTE — Progress Notes (Signed)
[facility]   Patient name: Jesus Rodriguez Admit date: 09/17/2021  DOB: 1950-09-07 LOS: 3  MRN: [id number] Consult date: 09/18/2021  Referring provider: Dr. Wynetta Emery, [facility]: Dyspnea    History:  71 yo male smoker presented to [facility] ER with worsening dyspnea, bloating for 3 days.  Needing 6 liters oxygen in ER.  Started on therapy for COPD exacerbation and CHF exacerbation.  Found to have hypercapnia with hypoxia and started on Bipap.  PCCM consulted to assist with respiratory management.  Past medical history:  HTN, HLD, COPD, Chronic respiratory failure on 3 liters, Diastolic CHF,  A fib, RA, Depression, Scrotal cancer, Anxiety, OSA  Significant events:  6/06 Admit 6/07 start on Bipap  Studies:  Echo 09/18/21 >> EF 55 to 60%, mild LVH, aortic root 40 mm  Micro:  COVID/Flu 6/06 >> negative  Lines:     Antibiotics:    Consults:      Interim history:  Feels like he is almost ready to go home.  Not having cough, wheeze, or chest pain.  Vital signs:  BP 121/71   Pulse 98   Temp 97.6 F (36.4 C) (Oral)   Resp 14   Ht '6\' 1"'$  (1.854 m)   Wt (!) 173.4 kg   SpO2 92%   BMI 50.44 kg/m   Intake/output:  I/O last 3 completed shifts: In: 480 [P.O.:480] Out: 2000 [Urine:2000]   Physical exam:   General - alert Eyes - pupils reactive ENT - no sinus tenderness, no stridor Cardiac - regular rate/rhythm, no murmur Chest - equal breath sounds b/l, no wheezing or rales Abdomen - soft, non tender, + bowel sounds Extremities - no cyanosis, clubbing, or edema Skin - venous stasis changes Neuro - normal strength, moves extremities, follows commands Psych - normal mood and behavior  Best practice:   DVT - Coumadin SUP - N/A Nutrition - Heart healthy   Assessment/plan:   COPD exacerbation. - yupelri, brovana, pulmicort for now; eventual plan to transition back to trelegy - wean off prednisone over next 5 days as  tolerated - prn albuterol - will need PFT as outpt  Sleep disordered breathing with acute on chronic hypoxic/hypercapnic respiratory failure. - goal SpO2 > 90% - Bipap qhs and prn - will need further sleep assessment as outpt  Metabolic alkalosis in setting of respiratory acidosis. - improved - diuretics per primary team  Tobacco abuse. - smoking cessation  Acute on chronic diastolic CHF. Atrial fibrillation. - per primary team - is followed by Dr. Harl Bowie with cardiology and has appt with Alvira Monday as his new PCP  Okay to d/c home from pulmonary standpoint.  Will have my office call to schedule pulmonary office visit in [place].  Please call if additional help needed while he is in hospital.  Resolved hospital problems:    Goals of care/Family discussions:  Code status: full  Labs:      Latest Ref Rng & Units 09/20/2021    4:10 AM 09/19/2021    4:19 AM 09/18/2021    3:37 AM  CMP  Glucose 70 - 99 mg/dL 204  165  154   BUN 8 - 23 mg/dL '25  22  20   '$ Creatinine 0.61 - 1.24 mg/dL 0.77  0.82  0.79   Sodium 135 - 145 mmol/L 140  140  142   Potassium 3.5 - 5.1 mmol/L 4.3  4.0  3.3   Chloride 98 - 111 mmol/L 90  86  88  CO2 22 - 32 mmol/L 43  43  <45   Calcium 8.9 - 10.3 mg/dL 9.1  9.0  8.8        Latest Ref Rng & Units 09/19/2021    4:19 AM 09/17/2021    8:57 AM 11/26/2020    4:22 AM  CBC  WBC 4.0 - 10.5 K/uL 8.5  7.5  7.1   Hemoglobin 13.0 - 17.0 g/dL 12.3  12.4  12.5   Hematocrit 39.0 - 52.0 % 40.2  40.0  39.3   Platelets 150 - 400 K/uL 202  192  253     ABG    Component Value Date/Time   PHART 7.52 (H) 09/18/2021 0835   PCO2ART 76 ([place]) 09/18/2021 0835   PO2ART 61 (L) 09/18/2021 0835   HCO3 62.1 (H) 09/18/2021 0835   O2SAT 95.3 09/18/2021 0835    Signature:  Chesley Mires, MD [place] Pager - [phone] 09/20/2021, 2:01 PM

## 2021-09-23 ENCOUNTER — Telehealth: Payer: Self-pay | Admitting: Pulmonary Disease

## 2021-09-23 NOTE — Telephone Encounter (Signed)
Okay to schedule him for 3 pm slot next time in an in [place] in July.

## 2021-09-23 NOTE — Telephone Encounter (Signed)
Pt has appt with Dr. Halford Chessman in [facility] 7/5 but would rather come to [place].  Couldn't come in tomorrow and Dr. Halford Chessman is back the week of 7/10.  Possibly see him in [place] that week.      Please advise if you are ok with seeing patient that week?

## 2021-09-24 ENCOUNTER — Telehealth: Payer: Self-pay | Admitting: Pulmonary Disease

## 2021-09-24 NOTE — Telephone Encounter (Signed)
Called and scheduled patient. Nothing further needed.

## 2021-09-25 NOTE — Telephone Encounter (Signed)
ATC patient, line just rang and rang no voicemail picked up will try to call again later

## 2021-09-26 ENCOUNTER — Telehealth: Payer: Self-pay | Admitting: Pulmonary Disease

## 2021-09-26 DIAGNOSIS — J9601 Acute respiratory failure with hypoxia: Secondary | ICD-10-CM

## 2021-09-26 DIAGNOSIS — J441 Chronic obstructive pulmonary disease with (acute) exacerbation: Secondary | ICD-10-CM

## 2021-09-26 MED ORDER — ALBUTEROL SULFATE (2.5 MG/3ML) 0.083% IN NEBU: 2.5000 mg | INHALATION_SOLUTION | Freq: Four times a day (QID) | RESPIRATORY_TRACT | 0 refills | Status: DC | PRN

## 2021-09-26 NOTE — Telephone Encounter (Signed)
Orders sent.  PA team can you help [place] with Prior Auth for albuterol solution?

## 2021-09-26 NOTE — Telephone Encounter (Signed)
Pt seen Dr. Halford Chessman in the hospital and has appt for hospital follow-up.  States Dr. Halford Chessman prescribed albuterol while in th hospital.  Needed a prescription for med and nebulizer machine.  Says insurance needs prior auth.  Please advise.  [facility] [place] [place].    Dr. Halford Chessman please advise, can we send in an order under your name for a nebulizer and medication before seeing patient?

## 2021-09-26 NOTE — Telephone Encounter (Signed)
Okay to send order for nebulizer and albuterol one vial q6h prn.  Given him 30 day supply with 6 refills.  Can use diagnosis code for COPD with chronic bronchitis.

## 2021-09-27 ENCOUNTER — Telehealth: Payer: Self-pay | Admitting: Family Medicine

## 2021-09-27 ENCOUNTER — Encounter: Payer: Self-pay | Admitting: Family Medicine

## 2021-09-27 ENCOUNTER — Ambulatory Visit: Payer: Medicare Other | Admitting: Family Medicine

## 2021-09-27 VITALS — BP 122/58 | HR 74 | Ht 73.0 in | Wt 371.0 lb

## 2021-09-27 DIAGNOSIS — I5033 Acute on chronic diastolic (congestive) heart failure: Secondary | ICD-10-CM

## 2021-09-27 DIAGNOSIS — I5032 Chronic diastolic (congestive) heart failure: Secondary | ICD-10-CM

## 2021-09-27 DIAGNOSIS — I10 Essential (primary) hypertension: Secondary | ICD-10-CM

## 2021-09-27 DIAGNOSIS — Z79899 Other long term (current) drug therapy: Secondary | ICD-10-CM

## 2021-09-27 DIAGNOSIS — J441 Chronic obstructive pulmonary disease with (acute) exacerbation: Secondary | ICD-10-CM

## 2021-09-27 DIAGNOSIS — G4739 Other sleep apnea: Secondary | ICD-10-CM

## 2021-09-27 DIAGNOSIS — I4819 Other persistent atrial fibrillation: Secondary | ICD-10-CM

## 2021-09-27 DIAGNOSIS — M19241 Secondary osteoarthritis, right hand: Secondary | ICD-10-CM

## 2021-09-27 DIAGNOSIS — R0603 Acute respiratory distress: Secondary | ICD-10-CM | POA: Diagnosis not present

## 2021-09-27 DIAGNOSIS — M19242 Secondary osteoarthritis, left hand: Secondary | ICD-10-CM

## 2021-09-27 DIAGNOSIS — J9621 Acute and chronic respiratory failure with hypoxia: Secondary | ICD-10-CM

## 2021-09-27 DIAGNOSIS — M174 Other bilateral secondary osteoarthritis of knee: Secondary | ICD-10-CM

## 2021-09-27 MED ORDER — UNABLE TO FIND: 0 refills | Status: DC

## 2021-09-27 NOTE — Progress Notes (Unsigned)
New Patient Office Visit  Subjective:  Patient ID: Jesus Rodriguez, male    DOB: Oct 31, 1950  Age: 71 y.o. MRN: [id number]  CC:  Chief Complaint  Patient presents with   New Patient (Initial Visit)    Pt establishing care, states he was in the hospital from 09-17-21 to 09-20-21, c/o sob.     HPI Jesus Rodriguez is a 71 y.o. male with past medical history of hypertension, hyperlipidemia, COPD, diastolic CHF, atrial fibrillation, rheumatoid arthritis, depression, scrotal carcinoma presenting with 3-day history of shortness of breath and increasing abdominal girth presents for establishing care. The patient who is chronically on 3 L of oxygen at home was seen in the ED on 09/17/21 for respiratory distress secondary to pulmonary edema and COPD exacerbation. EKG showed atrial fibrillation with nonspecific ST/T wave changes.  Chest x-ray showed pulmonary edema and bilateral pleural effusions.  INR was 2.0.  COVID PCR was negative. The patient received 80 mg IV Lasix along with albuterol nebulizer and Solu-Medrol 125 mg IV.  Following his discharge, his daughter reports that her dad condition has not improved. She states that she has not been able to get the nebulizer machine that she needs for his treatment but she notes that he has completed his prednisone.    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   Sleep apnea 02/13/2016    Past Surgical History:  Procedure Laterality Date   A-FLUTTER ABLATION      at [facility] 2018   CARDIOVERSION  02/10/2018   CARDIOVERSION N/A 05/13/2018   Procedure: CARDIOVERSION;  Surgeon: Sanda Klein, MD;  Location: [facility];  Service: Cardiovascular;  Laterality: N/A;   CATARACT EXTRACTION      Family History  Problem Relation Age of Onset   Hypertension Father    Heart attack Father    Prostate cancer Father    Hypertension Sister    Hypertension Sister    Hypertension Sister    Epilepsy Son    Diabetes Son     Social History   Socioeconomic History   Marital status: Divorced    Spouse name: Not on file   Number of children: Not on file   Years of education: Not on file   Highest education level: Not on file  Occupational History   Not on file  Tobacco Use   Smoking status: Every Day    Packs/day: 2.00    Years: 35.00    Total pack years: 70.00    Types: Cigarettes    Start date: 05/28/1971   Smokeless tobacco: Never  Vaping Use   Vaping Use: Never used  Substance and Sexual Activity   Alcohol use: No   Drug use: No   Sexual activity: Not on file  Other Topics Concern   Not on file  Social History Narrative   Not on file   Social Determinants of Health   Financial Resource Strain: Not on file  Food Insecurity: Not on file  Transportation Needs: Not on file  Physical Activity: Not on file  Stress: Not on file  Social Connections: Not on file  Intimate Partner Violence: Not on file    ROS Review of Systems  Constitutional:  Positive for chills. Negative for fever.  HENT:  Negative for rhinorrhea, sinus pressure and sinus pain.   Eyes:  Negative for discharge and visual disturbance.  Respiratory:  Positive for shortness of breath and wheezing. Negative for chest tightness.   Cardiovascular:  Positive for chest pain and palpitations.  Gastrointestinal:  Positive for diarrhea (prednidon causes diarrhea). Negative for nausea and vomiting.  Endocrine: Negative for heat intolerance.  Neurological:  Negative for dizziness,  syncope and headaches.    Objective:   Today's Vitals: BP (!) 122/58   Pulse 74   Ht '6\' 1"'$  (1.854 m)   Wt (!) 371 lb (168.3 kg) Comment: patient reported  SpO2 96% Comment: o2 set to 3  BMI 48.95 kg/m   Physical Exam  Assessment & Plan:   Problem List Items Addressed This Visit   None   Outpatient Encounter Medications as of 09/27/2021  Medication Sig   acetaminophen (TYLENOL) 500 MG tablet Take 500 mg by mouth every 6 (six) hours as needed.   albuterol (PROVENTIL) (2.5 MG/3ML) 0.083% nebulizer solution Take 3 mLs (2.5 mg total) by nebulization every 6 (six) hours as needed for wheezing or shortness of breath.   albuterol (VENTOLIN HFA) 108 (90 Base) MCG/ACT inhaler Inhale 1 puff into the lungs every 6 (six) hours as needed.   benazepril (LOTENSIN) 10 MG tablet Take 1 tablet (10 mg total) by mouth daily.   ketoconazole (NIZORAL) 2 % cream Apply 1 application topically 2 (two) times daily as needed for irritation.   Metoprolol Tartrate 75 MG TABS TAKE 1 TABLET BY MOUTH TWICE A DAY   potassium chloride (KLOR-CON M) 20 MEQ tablet Take 1 tablet (20 mEq total) by mouth daily.   simvastatin (ZOCOR) 20 MG tablet TAKE 1 TABLET BY MOUTH DAILY AT 6 PM.   torsemide (DEMADEX) 20 MG tablet Take 3 tablets (60 mg total) by mouth 2 (two) times daily.   TRELEGY ELLIPTA 100-62.5-25 MCG/INH AEPB Inhale 1 puff into the lungs daily.   triamcinolone (KENALOG) 0.1 % Apply 1 application topically 3 (three) times daily.   warfarin (COUMADIN) 10 MG tablet Take 0.5-1 tablets (5-10 mg total) by mouth daily. Tue thur and Sunday take '5mg'$  and mon, wed, fri, and sat take '10mg'$    No facility-administered encounter medications on file as of 09/27/2021.    Follow-up: No follow-ups on file.   Alvira Monday, FNP

## 2021-09-27 NOTE — Telephone Encounter (Signed)
Pt coming in today for appt

## 2021-09-27 NOTE — Telephone Encounter (Signed)
Tressia Danas, physical therapist with [facility] [phone],  called stating they are recommending home PT for 1 time for 2 weeks, 2 times for 2 wks, & 1 time for 2 wks?   Daughter will be with pt for his appt today. They will have a list of medical equipment needs & he is in desperate need for the equipment

## 2021-09-27 NOTE — Patient Instructions (Addendum)
I appreciate the opportunity to provide care to you today!    Follow up:  3 month  Labs: please stop by the lab today to get your blood drawn (CBC, BMP)  Referrals today-  physical medicine rehab and ambulatory referral to Home health   Please continue to a heart-healthy diet and increase your physical activities. Try to exercise for 100mns at least three times a week.      It was a pleasure to see you and I look forward to continuing to work together on your health and well-being. Please do not hesitate to call the office if you need care or have questions about your care.   Have a wonderful day and week. With Gratitude, GAlvira MondayMSN, FNP-BC

## 2021-09-28 LAB — CBC
Hematocrit: 39.5 % (ref 37.5–51.0)
Hemoglobin: 12.9 g/dL — ABNORMAL LOW (ref 13.0–17.7)
MCH: 29.9 pg (ref 26.6–33.0)
MCHC: 32.7 g/dL (ref 31.5–35.7)
MCV: 91 fL (ref 79–97)
Platelets: 157 10*3/uL (ref 150–450)
RBC: 4.32 x10E6/uL (ref 4.14–5.80)
RDW: 12.9 % (ref 11.6–15.4)
WBC: 7.4 10*3/uL (ref 3.4–10.8)

## 2021-09-28 LAB — BASIC METABOLIC PANEL
BUN/Creatinine Ratio: 23 (ref 10–24)
BUN: 18 mg/dL (ref 8–27)
CO2: 39 mmol/L — ABNORMAL HIGH (ref 20–29)
Calcium: 8.9 mg/dL (ref 8.6–10.2)
Chloride: 95 mmol/L — ABNORMAL LOW (ref 96–106)
Creatinine, Ser: 0.78 mg/dL (ref 0.76–1.27)
Glucose: 120 mg/dL — ABNORMAL HIGH (ref 70–99)
Potassium: 5.6 mmol/L — ABNORMAL HIGH (ref 3.5–5.2)
Sodium: 147 mmol/L — ABNORMAL HIGH (ref 134–144)
eGFR: 95 mL/min/{1.73_m2} (ref >59)

## 2021-09-30 ENCOUNTER — Other Ambulatory Visit (HOSPITAL_COMMUNITY): Payer: Self-pay

## 2021-09-30 DIAGNOSIS — R0603 Acute respiratory distress: Secondary | ICD-10-CM | POA: Insufficient documentation

## 2021-09-30 MED ORDER — ALBUTEROL SULFATE (2.5 MG/3ML) 0.083% IN NEBU: 2.5000 mg | INHALATION_SOLUTION | Freq: Four times a day (QID) | RESPIRATORY_TRACT | 0 refills | Status: DC | PRN

## 2021-09-30 NOTE — Assessment & Plan Note (Signed)
-  Vitals are stable today -O2 96% on 3L of oxygen in the clinic -C/o of SOB when ambulating short distances -Reports wanting a bariatric wheelchair and bariatric commode -Orders placed

## 2021-09-30 NOTE — Assessment & Plan Note (Signed)
-  his daughter states that they have not received his nebulizer treatment because of the insurance, and they will need a prescription for the machine - orders placed for the nebulizer machine

## 2021-09-30 NOTE — Telephone Encounter (Signed)
Order sent to [facility].

## 2021-09-30 NOTE — Telephone Encounter (Signed)
Hey Danielle!  It looks like the Amb. Referral order was for a nebulizer machine but not for the solution only the machine.

## 2021-09-30 NOTE — Assessment & Plan Note (Signed)
Following up with cardiology on 10/23/21

## 2021-09-30 NOTE — Assessment & Plan Note (Signed)
Stable on metoprolol Encouraged to continue treatment

## 2021-10-01 ENCOUNTER — Telehealth: Payer: Self-pay | Admitting: Pulmonary Disease

## 2021-10-01 NOTE — Telephone Encounter (Signed)
Can you check to see if albuterol 0.63 mg/24m nebulized solution is covered by insurance.  If not, then can you check if duoneb is covered.  Perhaps send a message to pharmacy team to investigate further.

## 2021-10-01 NOTE — Telephone Encounter (Signed)
Patient is unable to get original Rx of albuterol because insurance wont cover and they cant pay out of pocket. Wife would like another rx for different neb solution sent in. Sending high priority as pts wife states patient needs medication asap.   Dr. Halford Chessman please advise.    [facility]

## 2021-10-01 NOTE — Telephone Encounter (Signed)
ATC patients insurance company and was transferred to different numbers for approx. 1 hour. Spoke to Vesper and she said it will need a prior [place].   [place] and spoke to [place] (?) and he states the albuterol neb solution cannot be billed under part b but cash price is $34.96.   Called and notified Rosezena Sensor patients daughter and she states she will pick up the solution from [place] and pay cash price. Advised her to call before going to ensure they can get it since they live in [place].   Nothing further needed at this time.   Will call Rosezena Sensor before leaving today to make sure she gets patients neb solutions and neb machine before closing encounter.

## 2021-10-01 NOTE — Telephone Encounter (Signed)
PA team is this something you guys can help [place] with?

## 2021-10-02 NOTE — Telephone Encounter (Signed)
Called and spoke to [place] this morning to make sure pt got his medication and machine. She verified that he received both yesterday after we spoke. Nothing further needed.

## 2021-10-03 ENCOUNTER — Emergency Department (HOSPITAL_COMMUNITY): Payer: Medicare Other

## 2021-10-03 ENCOUNTER — Telehealth: Payer: Self-pay

## 2021-10-03 ENCOUNTER — Telehealth: Payer: Self-pay | Admitting: Cardiology

## 2021-10-03 ENCOUNTER — Other Ambulatory Visit: Payer: Self-pay

## 2021-10-03 ENCOUNTER — Emergency Department (HOSPITAL_COMMUNITY)
Admission: EM | Admit: 2021-10-03 | Discharge: 2021-10-07 | Disposition: A | Discharge status: Home / Self Care | Payer: Medicare Other | Attending: Emergency Medicine | Admitting: Emergency Medicine

## 2021-10-03 ENCOUNTER — Other Ambulatory Visit: Payer: Self-pay | Admitting: Family Medicine

## 2021-10-03 ENCOUNTER — Encounter (HOSPITAL_COMMUNITY): Payer: Self-pay

## 2021-10-03 DIAGNOSIS — I10 Essential (primary) hypertension: Secondary | ICD-10-CM | POA: Diagnosis not present

## 2021-10-03 DIAGNOSIS — R791 Abnormal coagulation profile: Secondary | ICD-10-CM | POA: Insufficient documentation

## 2021-10-03 DIAGNOSIS — Z79899 Other long term (current) drug therapy: Secondary | ICD-10-CM | POA: Insufficient documentation

## 2021-10-03 DIAGNOSIS — I482 Chronic atrial fibrillation, unspecified: Secondary | ICD-10-CM | POA: Insufficient documentation

## 2021-10-03 DIAGNOSIS — R2243 Localized swelling, mass and lump, lower limb, bilateral: Secondary | ICD-10-CM | POA: Diagnosis not present

## 2021-10-03 DIAGNOSIS — R0602 Shortness of breath: Secondary | ICD-10-CM | POA: Diagnosis present

## 2021-10-03 DIAGNOSIS — Z7902 Long term (current) use of antithrombotics/antiplatelets: Secondary | ICD-10-CM | POA: Insufficient documentation

## 2021-10-03 LAB — CBC WITH DIFFERENTIAL/PLATELET
Abs Immature Granulocytes: 0.02 10*3/uL (ref 0.00–0.07)
Basophils Absolute: 0.1 10*3/uL (ref 0.0–0.1)
Basophils Relative: 1 %
Eosinophils Absolute: 0.3 10*3/uL (ref 0.0–0.5)
Eosinophils Relative: 4 %
HCT: 37.7 % — ABNORMAL LOW (ref 39.0–52.0)
Hemoglobin: 11.7 g/dL — ABNORMAL LOW (ref 13.0–17.0)
Immature Granulocytes: 0 %
Lymphocytes Relative: 12 %
Lymphs Abs: 0.9 10*3/uL (ref 0.7–4.0)
MCH: 30.1 pg (ref 26.0–34.0)
MCHC: 31 g/dL (ref 30.0–36.0)
MCV: 96.9 fL (ref 80.0–100.0)
Monocytes Absolute: 0.6 10*3/uL (ref 0.1–1.0)
Monocytes Relative: 7 %
Neutro Abs: 6.2 10*3/uL (ref 1.7–7.7)
Neutrophils Relative %: 76 %
Platelets: 165 10*3/uL (ref 150–400)
RBC: 3.89 MIL/uL — ABNORMAL LOW (ref 4.22–5.81)
RDW: 14.6 % (ref 11.5–15.5)
WBC: 8 10*3/uL (ref 4.0–10.5)
nRBC: 0 % (ref 0.0–0.2)

## 2021-10-03 LAB — BASIC METABOLIC PANEL
Anion gap: 8 (ref 5–15)
BUN: 20 mg/dL (ref 8–23)
CO2: 38 mmol/L — ABNORMAL HIGH (ref 22–32)
Calcium: 8.4 mg/dL — ABNORMAL LOW (ref 8.9–10.3)
Chloride: 94 mmol/L — ABNORMAL LOW (ref 98–111)
Creatinine, Ser: 0.9 mg/dL (ref 0.61–1.24)
GFR, Estimated: >60 mL/min (ref >60)
Glucose, Bld: 113 mg/dL — ABNORMAL HIGH (ref 70–99)
Potassium: 4.3 mmol/L (ref 3.5–5.1)
Sodium: 140 mmol/L (ref 135–145)

## 2021-10-03 LAB — PROTIME-INR
INR: 2.1 — ABNORMAL HIGH (ref 0.8–1.2)
Prothrombin Time: 23 seconds — ABNORMAL HIGH (ref 11.4–15.2)

## 2021-10-03 LAB — BRAIN NATRIURETIC PEPTIDE: B Natriuretic Peptide: 113 pg/mL — ABNORMAL HIGH (ref 0.0–100.0)

## 2021-10-03 MED ORDER — UMECLIDINIUM BROMIDE 62.5 MCG/ACT IN AEPB
1.0000 | INHALATION_SPRAY | Freq: Every day | RESPIRATORY_TRACT | Status: DC
  Administered 2021-10-04 – 2021-10-07 (×4): 1 via RESPIRATORY_TRACT
  Filled 2021-10-03 (×2): qty 7

## 2021-10-03 MED ORDER — BENAZEPRIL HCL 5 MG PO TABS
10.0000 mg | ORAL_TABLET | Freq: Every day | ORAL | Status: DC
  Administered 2021-10-04 – 2021-10-06 (×3): 10 mg via ORAL
  Filled 2021-10-03 (×4): qty 2

## 2021-10-03 MED ORDER — FLUTICASONE FUROATE-VILANTEROL 100-25 MCG/ACT IN AEPB
1.0000 | INHALATION_SPRAY | Freq: Every day | RESPIRATORY_TRACT | Status: DC
  Administered 2021-10-04 – 2021-10-07 (×4): 1 via RESPIRATORY_TRACT
  Filled 2021-10-03 (×2): qty 28

## 2021-10-03 MED ORDER — WARFARIN SODIUM 5 MG PO TABS: 5.0000 mg | ORAL_TABLET | Freq: Every day | ORAL | Status: DC

## 2021-10-03 MED ORDER — BENAZEPRIL HCL 5 MG PO TABS: 10.0000 mg | ORAL_TABLET | Freq: Every day | ORAL | Status: DC

## 2021-10-03 MED ORDER — WARFARIN SODIUM 5 MG PO TABS
5.0000 mg | ORAL_TABLET | Freq: Once | ORAL | Status: AC
Start: 2021-10-03 — End: 2021-10-03
  Administered 2021-10-03: 5 mg via ORAL
  Filled 2021-10-03: qty 1

## 2021-10-03 MED ORDER — WARFARIN - PHARMACIST DOSING INPATIENT: Freq: Every day | Status: DC

## 2021-10-03 MED ORDER — METOPROLOL TARTRATE 25 MG PO TABS
75.0000 mg | ORAL_TABLET | Freq: Two times a day (BID) | ORAL | Status: DC
  Administered 2021-10-03 – 2021-10-07 (×7): 75 mg via ORAL
  Filled 2021-10-03 (×8): qty 3

## 2021-10-03 MED ORDER — ALBUTEROL SULFATE HFA 108 (90 BASE) MCG/ACT IN AERS: 1.0000 | INHALATION_SPRAY | Freq: Four times a day (QID) | RESPIRATORY_TRACT | Status: DC | PRN

## 2021-10-03 MED ORDER — TORSEMIDE 20 MG PO TABS
60.0000 mg | ORAL_TABLET | Freq: Two times a day (BID) | ORAL | Status: DC
  Administered 2021-10-03 – 2021-10-07 (×7): 60 mg via ORAL
  Filled 2021-10-03 (×7): qty 3

## 2021-10-03 MED ORDER — ALBUTEROL SULFATE (2.5 MG/3ML) 0.083% IN NEBU
2.5000 mg | INHALATION_SOLUTION | Freq: Four times a day (QID) | RESPIRATORY_TRACT | Status: DC | PRN
  Administered 2021-10-05 – 2021-10-07 (×4): 2.5 mg via RESPIRATORY_TRACT
  Filled 2021-10-03 (×4): qty 3

## 2021-10-03 NOTE — ED Notes (Signed)
Changed pt to hospital bed from ED bed. Pt able to stand and pivot with assistance. Pt had an unknown bowel movement. Pt cleaned, sacral foam applied, barrier cream to buttocks and back of thighs. Bilateral buttocks are pink in appearance. New male purewick applied. Pt now resting comfortably in bed. 2 cups of ice water given

## 2021-10-03 NOTE — Telephone Encounter (Signed)
Returned call to pt. No answer. Left msg to return call to office.

## 2021-10-03 NOTE — Telephone Encounter (Signed)
Pt's daughter called stating pt has been taking his torsemide '60mg'$  BID and has not been helping, pt's leg is leaking, currently his home health aide is there with him now.

## 2021-10-03 NOTE — Telephone Encounter (Signed)
Pt c/o swelling: STAT is pt has developed SOB within 24 hours  If swelling, where is the swelling located? no  How much weight have you gained and in what time span? Yes, does not know how much  Have you gained 3 pounds in a day or 5 pounds in a week? Not sure  Do you have a log of your daily weights (if so, list)? no  Are you currently taking a fluid pill? Yes   Are you currently SOB? Yes   Have you traveled recently? No   Pt states that he currently SOB and he is on o2. He states that his pcp told him to contact cardiologist

## 2021-10-03 NOTE — Progress Notes (Signed)
Set up patient's home CPAP unit and hooked up tubing for O2 support while he is wearing it.  Unit is at bedside for patient.

## 2021-10-03 NOTE — Progress Notes (Signed)
ANTICOAGULATION CONSULT NOTE - Initial Consult  Pharmacy Consult for warfarin Indication: atrial fibrillation  Allergies  Allergen Reactions   Celebrex [Celecoxib]    Methotrexate Derivatives Other (See Comments)    Arrhythmia    Prednisone Other (See Comments)    Throwing up   Sulfa Antibiotics     Patient Measurements: Height: '6\' 2"'$  (188 cm) Weight: (!) 168.3 kg (371 lb) IBW/kg (Calculated) : 82.2   Vital Signs: Temp: 98.4 F (36.9 C) (06/22 1511) Temp Source: Oral (06/22 1511) BP: 100/54 (06/22 1845) Pulse Rate: 78 (06/22 1845)  Labs: Recent Labs    10/03/21 1520  HGB 11.7*  HCT 37.7*  PLT 165  LABPROT 23.0*  INR 2.1*  CREATININE 0.90    Estimated Creatinine Clearance: 124.2 mL/min (by C-G formula based on SCr of 0.9 mg/dL).   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

## 2021-10-03 NOTE — Progress Notes (Signed)
Please ask the patient if she is taking diuretics; if not, advise her to stop taking the potassium supplement.  His sodium is slightly elevated; I recommend she watch her salt intake.

## 2021-10-03 NOTE — ED Notes (Signed)
RT at bedside assisting pt with CPAP machine

## 2021-10-03 NOTE — ED Triage Notes (Signed)
Pt called for [place]. Currently on 15 L 02 and 100%.  Pt spent 10 days in ICU here, released about 1 week ago and has not felt good since then. EMS states 02 sats 90% on 3 L sitting up.  Hx of afib, heart failure, recent infections.

## 2021-10-03 NOTE — ED Provider Notes (Signed)
[facility] EMERGENCY DEPARTMENT Provider Note   CSN: [id number] Arrival date & time: 10/03/21  1455     History Chief Complaint  Patient presents with   Shortness of Breath    Jesus Rodriguez is a 71 y.o. male with history of chronic atrial fibrillation on warfarin, hypertension, hyperlipidemia, chronic respiratory failure with hypoxia and hypercarbia on 3 L normally who presents to the emergency department today for increased amount of swelling in his legs.  He states that he is "unable to get the fluid off."  Patient was recently discharged from the hospital on 09/20/2021 after being admitted secondary to acute on chronic respiratory failure.  Patient spoke with his primary care provider today and denied any new heart failure symptoms at that time.  He called EMS for shortness of breath today.  Patient was on 15 L O2 on arrival.   Shortness of Breath      Home Medications Prior to Admission medications   Medication Sig Start Date End Date Taking? Authorizing Provider  acetaminophen (TYLENOL) 500 MG tablet Take 500 mg by mouth every 6 (six) hours as needed.   Yes [provider]  albuterol (PROVENTIL) (2.5 MG/3ML) 0.083% nebulizer solution Take 3 mLs (2.5 mg total) by nebulization every 6 (six) hours as needed for wheezing or shortness of breath. 09/30/21 10/30/21 Yes Chesley Mires, MD  albuterol (VENTOLIN HFA) 108 (90 Base) MCG/ACT inhaler Inhale 1 puff into the lungs every 6 (six) hours as needed. 11/10/19  Yes [provider]  benazepril (LOTENSIN) 10 MG tablet Take 1 tablet (10 mg total) by mouth daily. 07/15/21  Yes Branch, Alphonse Guild, MD  ketoconazole (NIZORAL) 2 % cream Apply 1 application topically 2 (two) times daily as needed for irritation. 03/27/20  Yes [provider]  Metoprolol Tartrate 75 MG TABS TAKE 1 TABLET BY MOUTH TWICE A DAY 03/29/21  Yes Branch, Alphonse Guild, MD  simvastatin (ZOCOR) 20 MG tablet TAKE 1 TABLET BY MOUTH DAILY AT 6 PM. 07/15/21   Yes Branch, Alphonse Guild, MD  torsemide (DEMADEX) 20 MG tablet Take 3 tablets (60 mg total) by mouth 2 (two) times daily. 09/05/21  Yes BranchAlphonse Guild, MD  TRELEGY ELLIPTA 100-62.5-25 MCG/INH AEPB Inhale 1 puff into the lungs daily. 04/02/20  Yes [provider]  triamcinolone (KENALOG) 0.1 % Apply 1 application topically 3 (three) times daily. 02/25/20  Yes [provider]  warfarin (COUMADIN) 10 MG tablet Take 0.5-1 tablets (5-10 mg total) by mouth daily. Tue thur and Sunday take '5mg'$  and mon, wed, fri, and sat take '10mg'$  09/20/21  Yes Johnson, Clanford L, MD  doxycycline (VIBRAMYCIN) 100 MG capsule Take 100 mg by mouth daily. Patient not taking: Reported on 10/03/2021 09/29/21   [provider]  potassium chloride (KLOR-CON M) 20 MEQ tablet Take 1 tablet (20 mEq total) by mouth daily. Patient not taking: Reported on 10/03/2021 09/21/21   Murlean Iba, MD  UNABLE TO FIND Med Name: nebulizer mechine J44.1 09/27/21   Alvira Monday, [place] TO FIND Med Name: bariatric commode E66.01 09/27/21   Alvira Monday, [place]  UNABLE TO FIND Med Name: bariatric wheelchair E66.01 09/27/21   Alvira Monday, FNP      Allergies    Celebrex [celecoxib], Methotrexate derivatives, Prednisone, and Sulfa antibiotics    Review of Systems   Review of Systems  Respiratory:  Positive for shortness of breath.   All other systems reviewed and are negative.   Physical Exam Updated Vital Signs BP Marland Kitchen)  100/54   Pulse 78   Temp 98.4 F (36.9 C) (Oral)   Resp (!) 22   Ht '6\' 2"'$  (1.88 m)   Wt (!) 168.3 kg   SpO2 97%   BMI 47.63 kg/m  Physical Exam Vitals and nursing note reviewed.  Constitutional:      General: He is not in acute distress.    Appearance: Normal appearance. He is obese.  HENT:     Head: Normocephalic and atraumatic.  Eyes:     General:        Right eye: No discharge.        Left eye: No discharge.  Cardiovascular:     Comments: Regular rate and rhythm.   S1/S2 are distinct without any evidence of murmur, rubs, or gallops.  Radial pulses are 2+ bilaterally.  Dorsalis pedis pulses are 2+ bilaterally.  No evidence of pedal edema. Pulmonary:     Comments: Decrease air movement.  Difficult to appreciate full lung sounds due to body habitus. Abdominal:     General: Abdomen is flat. Bowel sounds are normal. There is no distension.     Tenderness: There is no abdominal tenderness. There is no guarding or rebound.  Musculoskeletal:        General: Normal range of motion.     Cervical back: Neck supple.  Skin:    General: Skin is warm and dry.     Findings: No rash.  Neurological:     General: No focal deficit present.     Mental Status: He is alert.  Psychiatric:        Mood and Affect: Mood normal.        Behavior: Behavior normal.     ED Results / Procedures / Treatments   Labs (all labs ordered are listed, but only abnormal results are displayed) Labs Reviewed  PROTIME-INR - Abnormal; Notable for the following components:      Result Value   Prothrombin Time 23.0 (*)    INR 2.1 (*)    All other components within normal limits  CBC WITH DIFFERENTIAL/PLATELET - Abnormal; Notable for the following components:   RBC 3.89 (*)    Hemoglobin 11.7 (*)    HCT 37.7 (*)    All other components within normal limits  BASIC METABOLIC PANEL - Abnormal; Notable for the following components:   Chloride 94 (*)    CO2 38 (*)    Glucose, Bld 113 (*)    Calcium 8.4 (*)    All other components within normal limits  BRAIN NATRIURETIC PEPTIDE - Abnormal; Notable for the following components:   B Natriuretic Peptide 113.0 (*)    All other components within normal limits    EKG EKG Interpretation  Date/Time:  Thursday October 03 2021 15:09:46 EDT Ventricular Rate:  66 PR Interval:    QRS Duration: 104 QT Interval:  415 QTC Calculation: 435 R Axis:   34 Text Interpretation: Atrial fibrillation Low voltage, precordial leads Borderline T  abnormalities, anterior leads Baseline wander in lead(s) V1 No significant change since prior 6/23 Confirmed by Aletta Edouard [id number]) on 10/03/2021 3:17:45 PM  Radiology DG Chest Port 1 View  Result Date: 10/03/2021 CLINICAL DATA:  Shortness of breath EXAM: PORTABLE CHEST 1 VIEW COMPARISON:  Chest x-ray dated September 19, 2021 FINDINGS: Cardiac and mediastinal contours are unchanged. Unchanged bibasilar consolidations. Possible small bilateral pleural effusions. No evidence of pneumothorax. IMPRESSION: Unchanged bibasilar consolidations with possible associated small pleural effusions. Electronically Signed   By: Denny Peon  Strickland M.D.   On: 10/03/2021 15:39    Procedures Procedures    Medications Ordered in ED Medications  albuterol (PROVENTIL) (2.5 MG/3ML) 0.083% nebulizer solution 2.5 mg (has no administration in time range)  albuterol (VENTOLIN HFA) 108 (90 Base) MCG/ACT inhaler 1 puff (has no administration in time range)  benazepril (LOTENSIN) tablet 10 mg (has no administration in time range)  Metoprolol Tartrate TABS 1 tablet (has no administration in time range)  torsemide (DEMADEX) tablet 60 mg (has no administration in time range)  fluticasone furoate-vilanterol (BREO ELLIPTA) 100-25 MCG/ACT 1 puff (has no administration in time range)    And  umeclidinium bromide (INCRUSE ELLIPTA) 62.5 MCG/ACT 1 puff (has no administration in time range)  warfarin (COUMADIN) tablet 5-10 mg (has no administration in time range)    ED Course/ Medical Decision Making/ A&P                           Medical Decision Making Jeovani Mullenbach is a 71 y.o. male patient who presents to the emergency department with shortness of breath.  Patient arrived via EMS on 15 L and was transferred ported into a treatment room immediately.  He was brought down to his baseline 3 L of oxygen and still sided above 95%.  No evidence of tachypnea.  He is not in respiratory distress at this time.  We will get basic labs, chest  x-ray, and BNP to evaluate.   Amount and/or Complexity of Data Reviewed Labs: ordered. Decision-making details documented in ED Course. Radiology: ordered and independent interpretation performed. Decision-making details documented in ED Course. ECG/medicine tests: ordered. Decision-making details documented in ED Course.  Risk Prescription drug management. Risk Details: Patient does not meet inpatient criteria at this time.  Patient adamant about not going home.  We discussed skilled nursing facility versus rehab patient would like to talk with social work to discuss these options.  Just prior to signout, daughter was at the bedside and was extremely disgruntled that the patient was being discharged and adamantly said that he needs to be in a SNF or rehab or the need to be transferred to another hospital.  She stated multiple times that the patient will not be going home today.  Patient will be placed in a border for [facility] consult.  I ordered his home medications.  Again he is in no acute distress at this time.   Final Clinical Impression(s) / ED Diagnoses Final diagnoses:  Shortness of breath    Rx / DC Orders ED Discharge Orders     None         Cherrie Gauze 10/03/21 1906    Luna Fuse, MD 10/11/21 1902

## 2021-10-04 LAB — PROTIME-INR
INR: 2.2 — ABNORMAL HIGH (ref 0.8–1.2)
Prothrombin Time: 24.2 seconds — ABNORMAL HIGH (ref 11.4–15.2)

## 2021-10-04 MED ORDER — WARFARIN SODIUM 5 MG PO TABS: 5.0000 mg | ORAL_TABLET | Freq: Once | ORAL | Status: DC

## 2021-10-04 MED ORDER — WARFARIN SODIUM 5 MG PO TABS
10.0000 mg | ORAL_TABLET | Freq: Once | ORAL | Status: AC
  Administered 2021-10-04: 10 mg via ORAL
  Filled 2021-10-04: qty 2

## 2021-10-04 NOTE — ED Notes (Signed)
Pt given 2 cups of ice water.  

## 2021-10-04 NOTE — Progress Notes (Addendum)
ANTICOAGULATION CONSULT NOTE -  Pharmacy Consult for warfarin Indication: atrial fibrillation  Allergies  Allergen Reactions   Celebrex [Celecoxib]    Methotrexate Derivatives Other (See Comments)    Arrhythmia    Prednisone Other (See Comments)    Throwing up   Sulfa Antibiotics     Patient Measurements: Height: 6\' 2"  (188 cm) Weight: (!) 168.3 kg (371 lb) IBW/kg (Calculated) : 82.2  Vital Signs: BP: 101/63 (06/23 0400) Pulse Rate: 74 (06/23 0600)  Labs: Recent Labs    10/03/21 1520 10/04/21 0332  HGB 11.7*  --   HCT 37.7*  --   PLT 165  --   LABPROT 23.0* 24.2*  INR 2.1* 2.2*  CREATININE 0.90  --      Estimated Creatinine Clearance: 124.2 mL/min (by C-G formula based on SCr of 0.9 mg/dL).   Medical History: Past Medical History:  Diagnosis Date   Anxiety 02/13/2016   Atrial flutter (HCC) 02/13/2016   s/p CTI ablation at [facility]   Chronic a-fib 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

## 2021-10-04 NOTE — Evaluation (Signed)
Physical Therapy Evaluation Patient Details Name: Burce Toye MRN: [id number] DOB: 05-18-1950 Today's Date: 10/04/2021  History of Present Illness  Joneric Squillace is a 71 year old male with a history of hypertension, hyperlipidemia, COPD, diastolic CHF, atrial fibrillation, rheumatoid arthritis, depression, scrotal carcinoma presenting with 3-day history of shortness of breath and increasing abdominal girth.  The patient is chronically on 3 L nasal cannula at home.  The patient also describes orthopnea type symptoms.  He states that his leg edema is about the same as usual.  He denies any fevers, chills, hemoptysis, abdominal pain, nausea, vomiting, diarrhea.  He continues to smoke 1 pack/day.  Because of his continued shortness of breath, the patient presented for further evaluation and treatment.   Clinical Impression  Patient presents supine in bed on 6 LPM. Consents to PT evaluation. Patient placed on 3 LPM to simulate home use.  Patient is supervision assist with bed mobility. Generalized weakness of B LE along with slight decrease in coordination observed with verbal cueing provided to assist with technical form. Patient is min assist with sit to stand transfers and use of RW. Patient requires rocking due to LE weakness in which he uses momentum to compensate. Fair UE strength to help with pushing up during transfer. Patient is min guard/assist with bed to chair/BSC transfers. Fair coordination with reliance on RW due to balance deficits. Patient able to ambulate 25 out of room with min guard and RW. Moderate gait deviations and balance observed. Patient ambulated on 3 LPM with vitals monitored. Patient desat toward end of gait with SpO2 reaching 80% but able to bring back up to 92% by end of session. Patient limited by fatigue. Patient tolerated sitting up in chair after therapy with nursing notified of mobility status. Patient will benefit from continued skilled physical therapy in hospital and  recommended venue below to increase strength, balance, endurance for safe ADLs and gait.      Recommendations for follow up therapy are one component of a multi-disciplinary discharge planning process, led by the attending physician.  Recommendations may be updated based on patient status, additional functional criteria and insurance authorization.  Follow Up Recommendations Skilled nursing-short term rehab (<3 hours/day) Can patient physically be transported by private vehicle: Yes    Assistance Recommended at Discharge Set up Supervision/Assistance  Patient can return home with the following  A little help with walking and/or transfers;Help with stairs or ramp for entrance;Assistance with cooking/housework;A little help with bathing/dressing/bathroom    Equipment Recommendations None recommended by PT  Recommendations for Other Services       Functional Status Assessment Patient has had a recent decline in their functional status and demonstrates the ability to make significant improvements in function in a reasonable and predictable amount of time.     Precautions / Restrictions Precautions Precautions: Fall Restrictions Weight Bearing Restrictions: No      Mobility  Bed Mobility Overal bed mobility: Needs Assistance Bed Mobility: Supine to Sit     Supine to sit: Supervision     General bed mobility comments: Slow labored movement with difficulty with LE coordination during supine to sit. Supervision assist required with verbal cueing to assist with proper technical form.    Transfers Overall transfer level: Needs assistance Equipment used: Rolling walker (2 wheels) Transfers: Sit to/from Stand, Bed to chair/wheelchair/BSC Sit to Stand: Min assist   Step pivot transfers: Min guard       General transfer comment: Min assist with STS and use of RW. Patient requires  rocking due to LE weakness in which momentum used as compensation. Fair UE strength to help with  pushing up. Min guard with bed to chair transfer and use of RW. Fair coordination with reliant on RW for balance. Mild balance deficits.    Ambulation/Gait Ambulation/Gait assistance: Min guard Gait Distance (Feet): 25 Feet Assistive device: Rolling walker (2 wheels) Gait Pattern/deviations: Decreased step length - right, Decreased step length - left, Decreased stride length, Decreased weight shift to left, Decreased weight shift to right, Trunk flexed, Wide base of support Gait velocity: decreased     General Gait Details: Ambulates 25 feet with min guard and RW. Moderate gait deviations with balance deficits observed. Ambulating on 3L LPM and limited due to fatigue. Vitals monitored.  Stairs            Wheelchair Mobility    Modified Rankin (Stroke Patients Only)       Balance Overall balance assessment: Needs assistance Sitting-balance support: Feet supported, Bilateral upper extremity supported Sitting balance-Leahy Scale: Fair Sitting balance - Comments: Seated EOB with UE support   Standing balance support: During functional activity, Reliant on assistive device for balance, Bilateral upper extremity supported Standing balance-Leahy Scale: Fair Standing balance comment: fair using RW                             Pertinent Vitals/Pain Pain Assessment Pain Assessment: Faces Faces Pain Scale: Hurts a little bit Pain Intervention(s): Limited activity within patient's tolerance, Monitored during session, Repositioned    Home Living Family/patient expects to be discharged to:: Private residence Living Arrangements: Children Available Help at Discharge: Family;Available 24 hours/day Type of Home: Mobile home Home Access: Stairs to enter Entrance Stairs-Rails: Right;Left;Can reach both Entrance Stairs-Number of Steps: 5-6   Home Layout: One level Home Equipment: Rollator (4 wheels);BSC/3in1;Grab bars - tub/shower;Standard Environmental consultant      Prior Function  Prior Level of Function : Independent/Modified Independent             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, drives, 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 ADLs Comments: Assited by family     Hand Dominance   Dominant Hand: Right    Extremity/Trunk Assessment   Upper Extremity Assessment Upper Extremity Assessment: Overall WFL for tasks assessed    Lower Extremity Assessment Lower Extremity Assessment: Generalized weakness    Cervical / Trunk Assessment Cervical / Trunk Assessment: Normal  Communication   Communication: No difficulties  Cognition Arousal/Alertness: Awake/alert Behavior During Therapy: WFL for tasks assessed/performed Overall Cognitive Status: Within Functional Limits for tasks assessed                                          General Comments      Exercises     Assessment/Plan    PT Assessment Patient needs continued PT services  PT Problem List Decreased strength;Decreased activity tolerance;Decreased balance;Decreased mobility;Decreased coordination       PT Treatment Interventions DME instruction;Stair training;Functional mobility training;Therapeutic activities;Therapeutic exercise;Balance training    PT Goals (Current goals can be found in the Care Plan section)  Acute Rehab PT Goals Patient Stated Goal: return home PT Goal Formulation: With patient Time For Goal Achievement: 10/18/21 Potential to Achieve Goals: Fair    Frequency Min 3X/week     Co-evaluation  AM-PAC PT "6 Clicks" Mobility  Outcome Measure Help needed turning from your back to your side while in a flat bed without using bedrails?: A Little Help needed moving from lying on your back to sitting on the side of a flat bed without using bedrails?: A Little Help needed moving to and from a bed to a chair (including a wheelchair)?: A Little Help needed standing up from a chair using your arms  (e.g., wheelchair or bedside chair)?: A Little Help needed to walk in hospital room?: A Little Help needed climbing 3-5 steps with a railing? : A Lot 6 Click Score: 17    End of Session Equipment Utilized During Treatment: Oxygen Activity Tolerance: Patient tolerated treatment well;Patient limited by fatigue Patient left: in chair;with call bell/phone within reach Nurse Communication: Mobility status PT Visit Diagnosis: Unsteadiness on feet (R26.81);Other abnormalities of gait and mobility (R26.89);Muscle weakness (generalized) (M62.81)    Time: 9147-8295 PT Time Calculation (min) (ACUTE ONLY): 40 min   Charges:   PT Evaluation $PT Eval Moderate Complexity: 1 Mod PT Treatments $Therapeutic Activity: 38-52 mins        12:20 PM, 10/04/21 Arther Abbott, S/PT

## 2021-10-04 NOTE — ED Notes (Signed)
Received this patient in no acute distress , patient respirations are even and non labored . Patient sitting in a chair . Pending transportation to outside facility . Continuing monitoring.

## 2021-10-04 NOTE — ED Notes (Signed)
TOC consulted for HH/DME/SNF needs. CSW spoke with pt in room about PT recommending SNF. Pt is agreeable to SNF at D/C. Pt is agreeable to referral at [facility] and [facility] facilities. CSW to send clinicals out to facilities. Insurance auth to be started when PT eval is completed. TOC to follow.

## 2021-10-04 NOTE — ED Notes (Addendum)
While on CPAP with 4L O2. Pt desat to 77%. Pt took themself off CPAP and while on RA, desat to 75%. Placed pt back on 6L [place], O2 sat 99/100%. Pt requested O2 at 4L [place] RT was consulted and if pt wants to got back on CPAP, RT will be called to assist to help with maintaining O2 sat.  Pt was asking about dispostion. Asked whether he would go to rehab or SNF. Pt not keen on going to SNF. Pt states "everyone wants to put me in a SNF". Pt is wanting to go back home.

## 2021-10-05 LAB — PROTIME-INR
INR: 2.1 — ABNORMAL HIGH (ref 0.8–1.2)
Prothrombin Time: 23 seconds — ABNORMAL HIGH (ref 11.4–15.2)

## 2021-10-05 MED ORDER — WARFARIN SODIUM 5 MG PO TABS
5.0000 mg | ORAL_TABLET | Freq: Once | ORAL | Status: AC
  Administered 2021-10-05: 5 mg via ORAL
  Filled 2021-10-05: qty 1

## 2021-10-05 MED ORDER — IPRATROPIUM BROMIDE 0.02 % IN SOLN
RESPIRATORY_TRACT | Status: AC
  Administered 2021-10-05: 0.5 mg
  Filled 2021-10-05: qty 2.5

## 2021-10-05 NOTE — ED Notes (Addendum)
Pt denies pain at this time. Ble swelling with xerotic skin noted. Abd distended but soft. Pt awaiting rehab tomorrow. Pt given fresh water. Turned bed so pt can lie on left side.

## 2021-10-05 NOTE — ED Notes (Signed)
Placed patient on dream station 18/10 with 3 liters bled in , Patient has his on CPAP but most likely needs updating. CPAP is his old unit and not his new unit, its one he carries around. Patient using his mask.

## 2021-10-05 NOTE — ED Notes (Signed)
Patient ambulated to bedside commode. Patient using restroom at this time. Patient educated on use of call light for assistance whenever he is finished. Patient verbalized understanding of use of call light.

## 2021-10-05 NOTE — ED Notes (Signed)
Pt now lying in bed. Warm blanket given. RT in to give breathing tx. Nad.

## 2021-10-06 LAB — PROTIME-INR
INR: 2 — ABNORMAL HIGH (ref 0.8–1.2)
Prothrombin Time: 22.3 seconds — ABNORMAL HIGH (ref 11.4–15.2)

## 2021-10-06 MED ORDER — WARFARIN SODIUM 5 MG PO TABS
10.0000 mg | ORAL_TABLET | Freq: Once | ORAL | Status: AC
  Administered 2021-10-06: 10 mg via ORAL
  Filled 2021-10-06: qty 2

## 2021-10-07 LAB — PROTIME-INR
INR: 2.1 — ABNORMAL HIGH (ref 0.8–1.2)
Prothrombin Time: 23.7 seconds — ABNORMAL HIGH (ref 11.4–15.2)

## 2021-10-07 MED ORDER — WARFARIN SODIUM 5 MG PO TABS: 10.0000 mg | ORAL_TABLET | Freq: Once | ORAL | Status: DC

## 2021-10-07 NOTE — ED Provider Notes (Signed)
Notified that the patient will be going to a rehab facility today.  Vital signs remained stable.  Patient is appropriate for discharge   Linwood Dibbles, MD 10/07/21 [phone]

## 2021-10-07 NOTE — Telephone Encounter (Signed)
Seen in ED after call made Will close note Per d/c will be discharged to rehab facility

## 2021-10-07 NOTE — ED Notes (Addendum)
Cleaned pt up, new gown applied to pts sacrum. Pt stood up for about 3 minutes 96% O2 on 4 liters nasal canula.

## 2021-10-08 ENCOUNTER — Encounter: Payer: Self-pay | Admitting: Physical Medicine and Rehabilitation

## 2021-10-09 NOTE — Telephone Encounter (Signed)
Since pt called the office, pt has been seen by PCP. Closing encounter.

## 2021-10-16 ENCOUNTER — Ambulatory Visit: Payer: Medicare Other | Admitting: Family Medicine

## 2021-10-16 ENCOUNTER — Inpatient Hospital Stay: Payer: Medicare Other | Admitting: Pulmonary Disease

## 2021-10-21 ENCOUNTER — Ambulatory Visit: Payer: Medicare Other | Admitting: Pulmonary Disease

## 2021-10-21 ENCOUNTER — Encounter: Payer: Self-pay | Admitting: Pulmonary Disease

## 2021-10-21 VITALS — BP 136/88 | HR 82 | Temp 97.8°F | Ht 74.0 in | Wt 383.2 lb

## 2021-10-21 DIAGNOSIS — J9611 Chronic respiratory failure with hypoxia: Secondary | ICD-10-CM | POA: Diagnosis not present

## 2021-10-21 DIAGNOSIS — G4730 Sleep apnea, unspecified: Secondary | ICD-10-CM | POA: Diagnosis not present

## 2021-10-21 DIAGNOSIS — J9612 Chronic respiratory failure with hypercapnia: Secondary | ICD-10-CM

## 2021-10-21 DIAGNOSIS — J449 Chronic obstructive pulmonary disease, unspecified: Secondary | ICD-10-CM | POA: Diagnosis not present

## 2021-10-21 MED ORDER — ALBUTEROL SULFATE HFA 108 (90 BASE) MCG/ACT IN AERS: 1.0000 | INHALATION_SPRAY | Freq: Four times a day (QID) | RESPIRATORY_TRACT | 5 refills | Status: DC | PRN

## 2021-10-21 NOTE — Patient Instructions (Signed)
Will get copy of your medical records from [facility]  Will have [employer] change your oxygen set up to 4 liters at night  Will arrange for sleep study at [place] lab  Follow up in 3 months

## 2021-10-21 NOTE — Progress Notes (Signed)
[facility]  Chief Complaint  Patient presents with   Hospitalization Follow-up    6/6-6/9 for resp failure at [facility] 3-4Lo2 cont. Would like refill on rescue inhaler.     Past Surgical History:  He  has a past surgical history that includes A-FLUTTER ABLATION; Cardioversion (02/10/2018); Cataract extraction; and Cardioversion (N/A, 05/13/2018).  Past Medical History:  HTN, HLD, COPD, Chronic respiratory failure on 3 liters, Diastolic CHF,  A fib, RA, Depression, Scrotal cancer, Anxiety, OSA  Constitutional:  BP 136/88 (BP Location: Left Arm, Patient Position: Sitting)   Pulse 82   Temp 97.8 F (36.6 C) (Temporal)   Ht '6\' 2"'$  (1.88 m)   Wt (!) 383 lb 3.2 oz (173.8 kg)   SpO2 95% Comment: 3LO2 cont  BMI 49.20 kg/m   Brief Summary:  Jesus Rodriguez is a 71 y.o. male with smoker with COPD and chronic respiratory failure.      Subjective:   He is here with his daughter.  He was in hospital in June 2023.    He is down to 1 ppd.  He uses 3 liters 24/7.  He thinks he needs higher amount, especially at night.  He has two CPAP machines.  Neither of these have attachment to use oxygen.  He got CPAP through [facility], but would like to have a DME closer to home.  He gets oxygen through [employer].    Trelegy helps.  He needs to use albuterol twice per day.  Leg swelling better compared to when he was in hospital.  He has cough with clear sputum.   Physical Exam:   Appearance - well kempt, wearing oxygen, uses a walker  ENMT - no sinus tenderness, no oral exudate, no LAN, Mallampati 4 airway, no stridor  Respiratory - decreased breath sounds bilaterally, no wheezing or rales  CV - s1s2 regular rate and rhythm, no murmurs  Ext - 1+ edema  Skin - diffuse flaking  Psych - normal mood and affect   Pulmonary testing:  ABG 09/18/21 >> pH 7.52, PCO2 76, PO2 61  Chest Imaging:    Sleep Tests:    Cardiac Tests:   Echo 09/18/21 >> EF 55 to 60%, mild LVH, aortic root 40 mm  Social History:  He  reports that he has been smoking cigarettes. He started smoking about 50 years ago. He has a 70.00 pack-year smoking history. He has never used smokeless tobacco. He reports that he does not drink alcohol and does not use drugs.  Family History:  His family history includes Diabetes in his son; Epilepsy in his son; Heart attack in his father; Hypertension in his father, sister, sister, and sister; Prostate cancer in his father.     Assessment/Plan:   COPD with chronic bronchitis. - will get copy of records from [facility] and then determine if he needs additional testing - continue trelegy 100 one puff daily with prn albuterol - he has a nebulizer  Chronic respiratory failure with hypoxia and hypercapnia. - he uses [employer]  - will continue 3 liters during the day and change to 4 liters at night - goal SpO2 90 to 95% - will need to arrange for in lab sleep study to assess whether he can needs CPAP or Bipap and how much oxygen he needs  Tobacco abuse. - discussed importance of smoking cessation  Obesity. - he is aware of how his weight is impacting his health  Chronic diastolic CHF, Atrial  fibrillation. - followed by Dr. Kerry Hough with cardiology  Time Spent Involved in Patient Care on Day of Examination:  38 minutes  Follow up:   Patient Instructions  Will get copy of your medical records from [facility]  Will have [employer] change your oxygen set up to 4 liters at night  Will arrange for sleep study at [facility] lab  Follow up in 3 months  Medication List:   Allergies as of 10/21/2021       Reactions   Celebrex [celecoxib]    Methotrexate Derivatives Other (See Comments)   Arrhythmia    Prednisone Other (See Comments)   Throwing up   Sulfa Antibiotics         Medication List        Accurate as of October 21, 2021  3:46 PM. If you have any questions, ask your nurse  or doctor.          STOP taking these medications    UNABLE TO FIND Stopped by: Chesley Mires, MD       TAKE these medications    acetaminophen 500 MG tablet Commonly known as: TYLENOL Take 500 mg by mouth every 6 (six) hours as needed.   albuterol (2.5 MG/3ML) 0.083% nebulizer solution Commonly known as: PROVENTIL Take 3 mLs (2.5 mg total) by nebulization every 6 (six) hours as needed for wheezing or shortness of breath.   albuterol 108 (90 Base) MCG/ACT inhaler Commonly known as: VENTOLIN HFA Inhale 1 puff into the lungs every 6 (six) hours as needed.   benazepril 10 MG tablet Commonly known as: LOTENSIN Take 1 tablet (10 mg total) by mouth daily.   ketoconazole 2 % cream Commonly known as: NIZORAL Apply 1 application topically 2 (two) times daily as needed for irritation.   Metoprolol Tartrate 75 MG Tabs TAKE 1 TABLET BY MOUTH TWICE A DAY   simvastatin 20 MG tablet Commonly known as: ZOCOR TAKE 1 TABLET BY MOUTH DAILY AT 6 PM.   torsemide 20 MG tablet Commonly known as: DEMADEX Take 3 tablets (60 mg total) by mouth 2 (two) times daily.   Trelegy Ellipta 100-62.5-25 MCG/ACT Aepb Generic drug: Fluticasone-Umeclidin-Vilant Inhale 1 puff into the lungs daily.   triamcinolone cream 0.1 % Commonly known as: KENALOG Apply 1 application topically 3 (three) times daily.   UNABLE TO FIND Med Name: bariatric commode E66.01   UNABLE TO FIND Med Name: bariatric wheelchair E66.01   warfarin 10 MG tablet Commonly known as: COUMADIN Take as directed by the anticoagulation clinic. If you are unsure how to take this medication, talk to your nurse or doctor. Original instructions: Take 0.5-1 tablets (5-10 mg total) by mouth daily. Tue thur and Sunday take '5mg'$  and mon, wed, fri, and sat take '10mg'$         Signature:  Chesley Mires, MD [place] Pager - [phone] 10/21/2021, 3:46 PM

## 2021-10-22 NOTE — Progress Notes (Signed)
Cardiology Office Note    Date:  10/23/2021   ID:  Jesus Rodriguez, DOB 16-Nov-1950, MRN [id number]  PCP:  Alvira Monday, [place]  Cardiologist: Carlyle Dolly, MD    Chief Complaint  Patient presents with   Hospitalization Follow-up    History of Present Illness:    Jesus Rodriguez is a 71 y.o. male with past medical history of typical atrial flutter (s/p CTI ablation in 03/2016, previously failed Sotalol and prior prolonged QTc on Tikosyn), HFpEF, HTN, HLD and OSA who presents to the office today for hospital follow-up.   He was last examined by Jesus Dung, NP in 01/2021 following a recent hospitalization during which Tikosyn was discontinued secondary to prolonged QT. Rates were well-controlled and he was continued on Metoprolol 75 mg twice daily along with Coumadin for anticoagulation.  In the interim, he was admitted to [facility] from 6/6 - 09/20/2021 for acute on chronic hypoxic respiratory failure in the setting of pulmonary edema and a COPD exacerbation. Weight at the time of discharge was 173.4 kg and he was restarted on Torsemide '60mg'$  BID. He presented back to the ED on 10/03/2021 for worsening dyspnea and while he did not require admission, he was adamant about not going home and requested SNF for rehab. Was ultimately discharged to SNF on 10/07/2021.  In talking with the patient and his daughter today, he reports he has returned home from SNF as he was receiving minimal PT and suboptimal care while there. He is hopeful to resume Home Health PT in the coming weeks but is awaiting a visit with his PCP. He has baseline dyspnea on exertion and is followed by Pulmonology and remains on supplemental oxygen at baseline. Also uses a CPAP at night. No specific orthopnea or PND. He denies any recent exertional chest pain. Does experience intermittent palpitations and says his heart rate becomes elevated in the 110's to 120's at home with minimal activity.   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     albuterol (PROVENTIL) (2.5 MG/3ML) 0.083% nebulizer solution Take 3 mLs (2.5 mg total) by nebulization every 6 (six) hours as needed for wheezing or shortness of breath. 360 mL 0   albuterol (VENTOLIN HFA) 108 (90 Base) MCG/ACT inhaler Inhale 1 puff into the lungs every 6 (six) hours as needed. 8 g 5   ketoconazole (NIZORAL) 2 % cream Apply 1 application topically 2 (two) times daily as needed for irritation.     simvastatin (ZOCOR) 20 MG tablet TAKE 1 TABLET  BY MOUTH DAILY AT 6 PM. 90 tablet 1   torsemide (DEMADEX) 20 MG tablet Take 3 tablets (60 mg total) by mouth 2 (two) times daily. 540 tablet 0   TRELEGY ELLIPTA 100-62.5-25 MCG/INH AEPB Inhale 1 puff into the lungs daily.     triamcinolone (KENALOG) 0.1 % Apply 1 application topically 3 (three) times daily.     warfarin (COUMADIN) 10 MG tablet Take 0.5-1 tablets (5-10 mg total) by mouth daily. Tue thur and Sunday take '5mg'$  and mon, wed, fri, and sat take '10mg'$      benazepril (LOTENSIN) 10 MG tablet Take 1  tablet (10 mg total) by mouth daily. 90 tablet 3   Metoprolol Tartrate 75 MG TABS TAKE 1 TABLET BY MOUTH TWICE A DAY 180 tablet 2   UNABLE TO FIND Med Name: bariatric commode E66.01 1 each 0   UNABLE TO FIND Med Name: bariatric wheelchair E66.01 1 each 0   No facility-administered medications prior to visit.     Allergies:   Celebrex [celecoxib], Methotrexate derivatives, Prednisone, and Sulfa antibiotics   Social History   Socioeconomic History   Marital status: Divorced    Spouse name: Not on file   Number of children: Not on file   Years of education: Not on file   Highest education level: Not on file  Occupational History   Not on file  Tobacco Use   Smoking status: Every Day    Packs/day: 2.00    Years: 35.00    Total pack years: 70.00    Types: Cigarettes    Start date: 05/28/1971   Smokeless tobacco: Never  Vaping Use   Vaping Use: Never used  Substance and Sexual Activity   Alcohol use: No   Drug use: No   Sexual activity: Not on file  Other Topics Concern   Not on file  Social History Narrative   Not on file   Social Determinants of Health   Financial Resource Strain: Not on file  Food Insecurity: Not on file  Transportation Needs: Not on file  Physical Activity: Not on file  Stress: Not on file  Social Connections: Not on file     Family History:  The patient's family history includes Diabetes in his son; Epilepsy in his son; Heart attack in his father; Hypertension in his father, sister, sister, and sister; Prostate cancer in his father.   Review of Systems:    Please see the history of present illness.     All other systems reviewed and are otherwise negative except as noted above.   Physical Exam:    VS:  BP 102/70   Pulse 77   Wt (!) 381 lb 3.2 oz (172.9 kg)   BMI 48.94 kg/m    General: Pleasant obese male appearing in no acute distress.  Head: Normocephalic, atraumatic. Neck: No carotid bruits. JVD not elevated.  Lungs: Respirations  regular and unlabored, without wheezes or rales. On supplemental oxygen.  Heart: Irregularly irregular. No S3 or S4.  No murmur, no rubs, or gallops appreciated. Abdomen: Appears non-distended. No obvious abdominal masses. Msk:  Strength and tone appear normal for age. No obvious joint deformities or effusions. Extremities: No clubbing or cyanosis. Chronic appearing lymphedema along with wound along right lateral calf. Distal pedal pulses are 2+ bilaterally. Neuro: Alert and oriented X 3. Moves all extremities spontaneously. No focal deficits noted. Psych:  Responds to questions appropriately with a normal affect. Skin: No rashes or lesions noted  Wt Readings from Last 3  Encounters:  10/23/21 (!) 381 lb 3.2 oz (172.9 kg)  10/21/21 (!) 383 lb 3.2 oz (173.8 kg)  10/03/21 (!) 371 lb (168.3 kg)     Studies/Labs Reviewed:   EKG:  EKG is not ordered today.    Recent Labs: 11/25/2020: TSH 1.421 09/17/2021: ALT 11 09/19/2021: Magnesium 2.5 10/03/2021: B Natriuretic Peptide 113.0; BUN 20; Creatinine, Ser 0.90; Hemoglobin 11.7; Platelets 165; Potassium 4.3; Sodium 140   Lipid Panel No results found for: "CHOL", "TRIG", "HDL", "CHOLHDL", "VLDL", "LDLCALC", "LDLDIRECT"  Additional studies/ records that were reviewed today include:   Echocardiogram: 09/18/2021 IMPRESSIONS     1. Abnormal septal motion . Left ventricular ejection fraction, by  estimation, is 55 to 60%. The left ventricle has normal function. The left  ventricle has no regional wall motion abnormalities. There is mild left  ventricular hypertrophy. Left  ventricular diastolic parameters are indeterminate.   2. Right ventricular systolic function is normal. The right ventricular  size is normal. There is normal pulmonary artery systolic pressure.   3. Right atrial size was mildly dilated.   4. The mitral valve is abnormal. Trivial mitral valve regurgitation. No  evidence of mitral stenosis.   5. The aortic valve is tricuspid.  There is moderate calcification of the  aortic valve. There is moderate thickening of the aortic valve. Aortic  valve regurgitation is not visualized. Aortic valve  sclerosis/calcification is present, without any evidence  of aortic stenosis.   6. Aortic dilatation noted. There is mild dilatation of the aortic root,  measuring 40 mm.   7. The inferior vena cava is dilated in size with >50% respiratory  variability, suggesting right atrial pressure of 8 mmHg.   Assessment:    1. Chronic heart failure with preserved ejection fraction (HCC)   2. Lymphedema   3. Persistent atrial fibrillation ([place])   4. Essential hypertension   5. Mixed hyperlipidemia   6. OSA (obstructive sleep apnea)      Plan:   In order of problems listed above:  1. HFpEF/Lymphedema - He has chronic appearing lymphedema on examination today and has developed a wound along his right lower leg. Will refer to the Wound Clinic here in [place] for further evaluation. - He is currently on Torsemide 60 mg twice daily and I encouraged him to see how his volume status does with dose adjustment of Lopressor as hopefully his volume will improve with better control of his HR. If his weight increases or he develops worsening abdominal distention, can further titrate Torsemide to 80 mg twice daily with a close follow-up BMET.   2. Typical Atrial Flutter/Persistent Atrial Fibrillation - He underwent CTI ablation in 03/2016. He previously failed Sotalol and had a prolonged QTc on Tikosyn, therefore a rate-control strategy has been pursued. He has been on Lopressor 75 mg twice daily but does experience elevated heart rate with minimal activity at home. Reviewed options with the patient and will plan to titrate Lopressor to 100 mg twice daily. Given his soft BP, will reduce Benazepril from 10 mg daily to 5 mg daily to allow room for this. May need to discontinue Benazepril altogether pending his BP trend. - He remains on Coumadin for  anticoagulation and his INR was rechecked today and at 2.2. Reviewed with Edrick Oh, RN and he will continue Coumadin at current dosing.   3. HTN - His BP is soft at 102/70 during today's visit. Given dose adjustment of Lopressor as outlined above, will plan to reduce Benazepril to 5  mg daily. I encouraged him to follow readings at home as this may need to be discontinued.  4. HLD - Followed by his PCP. He remains on Simvastatin 20 mg daily.  5. OSA - Followed by Pulmonology. He is on supplemental oxygen during the day and CPAP at night.    Medication Adjustments/Labs and Tests Ordered: Current medicines are reviewed at length with the patient today.  Concerns regarding medicines are outlined above.  Medication changes, Labs and Tests ordered today are listed in the Patient Instructions below. Patient Instructions  Medication Instructions:  Stay on your current dose of warfarin. Increase Metoprolol to 100 mg tablets twice daily Decrease Benazepril to 5 mg tablets daily  Labwork: None  Testing/Procedures: None  Follow-Up: Follow up with Dr. Harl Bowie in 3-4 months.   Any Other Special Instructions Will Be Listed Below (If Applicable).  You have been referred to the Wound Clinic. They will call you with your first appointment.    If you need a refill on your cardiac medications before your next appointment, please call your pharmacy.      Signed, Erma Heritage, PA-C  10/23/2021 4:42 PM    [facility] [address] [place], [place] [address] Phone: [phone] Fax: [phone]

## 2021-10-23 ENCOUNTER — Encounter: Payer: Self-pay | Admitting: Student

## 2021-10-23 ENCOUNTER — Telehealth: Payer: Self-pay | Admitting: Family Medicine

## 2021-10-23 ENCOUNTER — Ambulatory Visit: Payer: Medicare Other | Admitting: Student

## 2021-10-23 ENCOUNTER — Ambulatory Visit (INDEPENDENT_AMBULATORY_CARE_PROVIDER_SITE_OTHER): Payer: Medicare Other | Admitting: *Deleted

## 2021-10-23 VITALS — BP 102/70 | HR 77 | Wt 381.2 lb

## 2021-10-23 DIAGNOSIS — I4819 Other persistent atrial fibrillation: Secondary | ICD-10-CM

## 2021-10-23 DIAGNOSIS — I4891 Unspecified atrial fibrillation: Secondary | ICD-10-CM | POA: Diagnosis not present

## 2021-10-23 DIAGNOSIS — Z5181 Encounter for therapeutic drug level monitoring: Secondary | ICD-10-CM | POA: Diagnosis not present

## 2021-10-23 DIAGNOSIS — I5032 Chronic diastolic (congestive) heart failure: Secondary | ICD-10-CM | POA: Diagnosis not present

## 2021-10-23 DIAGNOSIS — G4733 Obstructive sleep apnea (adult) (pediatric): Secondary | ICD-10-CM

## 2021-10-23 DIAGNOSIS — I10 Essential (primary) hypertension: Secondary | ICD-10-CM

## 2021-10-23 DIAGNOSIS — I890 Lymphedema, not elsewhere classified: Secondary | ICD-10-CM | POA: Diagnosis not present

## 2021-10-23 DIAGNOSIS — E782 Mixed hyperlipidemia: Secondary | ICD-10-CM

## 2021-10-23 LAB — POCT INR: INR: 2.2 (ref 2.0–3.0)

## 2021-10-23 MED ORDER — METOPROLOL TARTRATE 100 MG PO TABS: 100.0000 mg | ORAL_TABLET | Freq: Two times a day (BID) | ORAL | 3 refills | Status: DC

## 2021-10-23 MED ORDER — BENAZEPRIL HCL 5 MG PO TABS: 5.0000 mg | ORAL_TABLET | Freq: Every day | ORAL | 3 refills | Status: DC

## 2021-10-23 NOTE — Patient Instructions (Addendum)
Medication Instructions:  Stay on your current dose of warfarin. Increase Metoprolol to 100 mg tablets twice daily Decrease Benazepril to 5 mg tablets daily  Labwork: None  Testing/Procedures: None  Follow-Up: Follow up with Dr. Harl Bowie in 3-4 months.   Any Other Special Instructions Will Be Listed Below (If Applicable).  You have been referred to the Wound Clinic. They will call you with your first appointment.    If you need a refill on your cardiac medications before your next appointment, please call your pharmacy.

## 2021-10-23 NOTE — Telephone Encounter (Signed)
Mariann Laster with [facility], call back [phone]  Called stating they have  faxed over 2 orders that they have not received back. Can you please look into this?

## 2021-10-23 NOTE — Patient Instructions (Signed)
Continue warfarin 1 tablet daily except for 1/2  tablet on Sunday, Tuesdays and Thursdays. Recheck INR 7 weeks.

## 2021-10-24 NOTE — Telephone Encounter (Signed)
Forms received and completed, attempted to contact Jesus Rodriguez back unable to reach her.

## 2021-10-25 ENCOUNTER — Encounter: Payer: Self-pay | Admitting: Pulmonary Disease

## 2021-10-30 ENCOUNTER — Telehealth: Payer: Self-pay | Admitting: Pulmonary Disease

## 2021-10-30 NOTE — Telephone Encounter (Signed)
Harl Bowie, Qunisha Bryk D, [place]; Estill Dooms; [place], Danton Sewer Looks like its been taken care of

## 2021-10-30 NOTE — Telephone Encounter (Signed)
Called and notified patients daughter that order was sent but I have messaged our contacts with [facility] to have them look into the situation.

## 2021-10-30 NOTE — Telephone Encounter (Signed)
Order placed on 7/10 and confirmation was received by [facility] with [facility]. Sent a staff message to [employer] asking them to look into this.

## 2021-11-01 NOTE — Telephone Encounter (Signed)
Atc patient and patients daughter to verify that they had what they needed/had been contacted by [facility]. Asked that they call us back if they still needed [place]. Will update/addend encounter if they call back. Otherwise, nothing further is needed.

## 2021-11-05 ENCOUNTER — Telehealth: Payer: Self-pay | Admitting: *Deleted

## 2021-11-05 ENCOUNTER — Ambulatory Visit (INDEPENDENT_AMBULATORY_CARE_PROVIDER_SITE_OTHER): Payer: Medicare Other | Admitting: *Deleted

## 2021-11-05 DIAGNOSIS — Z0000 Encounter for general adult medical examination without abnormal findings: Secondary | ICD-10-CM | POA: Diagnosis not present

## 2021-11-05 NOTE — Telephone Encounter (Signed)
Patient hands have been trembling for awhile he would like to know what he can do seems like the more fluid they draw off the more they tremble. He said he went to [facility] for rehab and this was a joke they didn't have running water in shower and only did 30 mins of rehab for a whole week. FYI

## 2021-11-05 NOTE — Progress Notes (Signed)
Subjective:   Jesus Rodriguez is a 71 y.o. male who presents for Medicare Annual/Subsequent preventive examination.  I connected with  Jesus Rodriguez on 11/05/21 by a audio enabled telemedicine application and verified that I am speaking with the correct person using two identifiers.  Patient Location: Home  Provider Location: Office/Clinic  I discussed the limitations of evaluation and management by telemedicine. The patient expressed understanding and agreed to proceed.  Review of Systems     Jesus Rodriguez , Thank you for taking time to come for your Medicare Wellness Visit. I appreciate your ongoing commitment to your health goals. Please review the following plan we discussed and let me know if I can assist you in the future.   These are the goals we discussed:  Goals   None     This is a list of the screening recommended for you and due dates:  Health Maintenance  Topic Date Due   Pneumonia Vaccine (1 - PCV) Never done   Hepatitis C Screening: USPSTF Recommendation to screen - Ages 48-79 yo.  Never done   Tetanus Vaccine  Never done   Zoster (Shingles) Vaccine (1 of 2) Never done   Colon Cancer Screening  Never done   COVID-19 Vaccine (4 - Booster for Moderna series) 05/09/2020   Flu Shot  11/12/2021   HPV Vaccine  Aged Out          Objective:    There were no vitals filed for this visit. There is no height or weight on file to calculate BMI.     10/03/2021    3:10 PM 09/17/2021    1:14 PM 09/17/2021    8:58 AM 11/20/2020   10:28 PM 11/20/2020    3:03 PM 11/27/2019    5:49 PM 11/27/2019    9:44 AM  Advanced Directives  Does Patient Have a Medical Advance Directive? No No No No Yes No No  Type of Advance Directive     Living will    Does patient want to make changes to medical advance directive?     No - Patient declined    Would patient like information on creating a medical advance directive?  No - Patient declined   No - Patient declined No - Patient declined No -  Guardian declined    Current Medications (verified) Outpatient Encounter Medications as of 11/05/2021  Medication Sig   acetaminophen (TYLENOL) 500 MG tablet Take 500 mg by mouth every 6 (six) hours as needed.   albuterol (PROVENTIL) (2.5 MG/3ML) 0.083% nebulizer solution Take 3 mLs (2.5 mg total) by nebulization every 6 (six) hours as needed for wheezing or shortness of breath.   albuterol (VENTOLIN HFA) 108 (90 Base) MCG/ACT inhaler Inhale 1 puff into the lungs every 6 (six) hours as needed.   benazepril (LOTENSIN) 5 MG tablet Take 1 tablet (5 mg total) by mouth daily.   ketoconazole (NIZORAL) 2 % cream Apply 1 application topically 2 (two) times daily as needed for irritation.   metoprolol tartrate (LOPRESSOR) 100 MG tablet Take 1 tablet (100 mg total) by mouth 2 (two) times daily.   simvastatin (ZOCOR) 20 MG tablet TAKE 1 TABLET BY MOUTH DAILY AT 6 PM.   torsemide (DEMADEX) 20 MG tablet Take 3 tablets (60 mg total) by mouth 2 (two) times daily.   TRELEGY ELLIPTA 100-62.5-25 MCG/INH AEPB Inhale 1 puff into the lungs daily.   triamcinolone (KENALOG) 0.1 % Apply 1 application topically 3 (three) times daily.   UNABLE  TO FIND Med Name: bariatric commode E66.01   UNABLE TO FIND Med Name: bariatric wheelchair E66.01   warfarin (COUMADIN) 10 MG tablet Take 0.5-1 tablets (5-10 mg total) by mouth daily. Tue thur and Sunday take '5mg'$  and mon, wed, fri, and sat take '10mg'$    No facility-administered encounter medications on file as of 11/05/2021.    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   Sleep apnea 02/13/2016   Past Surgical History:  Procedure Laterality Date   A-FLUTTER ABLATION     at [facility] 2018   CARDIOVERSION  02/10/2018   CARDIOVERSION N/A 05/13/2018   Procedure: CARDIOVERSION;  Surgeon: Sanda Klein, MD;  Location: [facility];  Service: Cardiovascular;  Laterality: N/A;   CATARACT EXTRACTION     Family History  Problem Relation Age of Onset   Hypertension Father    Heart attack Father    Prostate cancer Father    Hypertension Sister    Hypertension Sister    Hypertension Sister    Epilepsy Son    Diabetes Son    Social History   Socioeconomic History   Marital status: Divorced    Spouse name: Not on file   Number of children: Not on file   Years of education: Not on file   Highest education level: Not on file  Occupational History   Not on file  Tobacco Use   Smoking status: Every Day    Packs/day: 2.00    Years: 35.00    Total pack years: 70.00    Types: Cigarettes    Start date: 05/28/1971   Smokeless tobacco: Never  Vaping Use   Vaping Use: Never used  Substance and Sexual Activity   Alcohol use: No   Drug use: No   Sexual activity: Not on file  Other Topics Concern   Not on file  Social History Narrative   Not on file   Social Determinants of Health   Financial Resource Strain: Not on file  Food Insecurity: Not on file  Transportation Needs: Not on file  Physical Activity: Not on file  Stress: Not on file  Social Connections: Not on file    Tobacco Counseling Ready to quit: Not Answered Counseling given: Not Answered   Clinical Intake:                 Diabetic?no         Activities of Daily Living    09/17/2021    1:14 PM 11/20/2020   10:32 PM  In your present state of health, do you have any difficulty performing the following activities:  Hearing? 0   Vision? 0    Difficulty concentrating or making decisions? 0   Walking or climbing stairs? 1   Dressing or bathing? 0   Doing errands, shopping? 1 1    Patient Care Team: Alvira Monday, FNP as PCP - General (Family Medicine) Harl Bowie Alphonse Guild, MD as PCP - Cardiology (Cardiology) Thompson Grayer, MD as PCP - Electrophysiology (  Cardiology) Lelon Perla, MD as Referring Physician (Internal Medicine)  Indicate any recent Medical Services you may have received from other than [facility] providers in the past year (date may be approximate).     Assessment:   This is a routine wellness examination for Jesus Rodriguez.  Hearing/Vision screen No results found.  Dietary issues and exercise activities discussed:     Goals Addressed   None   Depression Screen    09/27/2021    3:05 PM  PHQ 2/9 Scores  PHQ - 2 Score 2  PHQ- 9 Score 8    Fall Risk    09/27/2021    3:05 PM  Fall Risk   Falls in the past year? 1  Number falls in past yr: 1  Injury with Fall? 1  Risk for fall due to : Impaired balance/gait;History of fall(s)  Follow up Education provided    [place]:  Any stairs in or around the home? Yes  If so, are there any without handrails? No  Home free of loose throw rugs in walkways, pet beds, electrical cords, etc? Yes  Adequate lighting in your home to reduce risk of falls? Yes   ASSISTIVE DEVICES UTILIZED TO PREVENT FALLS:  Life alert? No  Use of a cane, walker or w/c? Yes  Grab bars in the bathroom? Yes  Shower chair or bench in shower? Yes  Elevated toilet seat or a handicapped toilet? Yes    Cognitive Function:        Immunizations Immunization History  Administered Date(s) Administered   Fluad Quad(high Dose 65+) 01/13/2020, 01/15/2021   Influenza, High Dose Seasonal PF 05/14/2018   Influenza, Seasonal, Injecte, Preservative Fre 12/18/2015   Influenza,inj,Quad PF,6+ Mos 01/04/2019   Moderna Sars-Covid-2 Vaccination  05/16/2019, 06/13/2019, 03/14/2020    TDAP status: Due, Education has been provided regarding the importance of this vaccine. Advised may receive this vaccine at local pharmacy or Health Dept. Aware to provide a copy of the vaccination record if obtained from local pharmacy or Health Dept. Verbalized acceptance and understanding.  Flu Vaccine status: Up to date  Pneumococcal vaccine status: Up to date  Covid-19 vaccine status: Completed vaccines  Qualifies for Shingles Vaccine? Yes   Zostavax completed No   Shingrix Completed?: No.    Education has been provided regarding the importance of this vaccine. Patient has been advised to call insurance company to determine out of pocket expense if they have not yet received this vaccine. Advised may also receive vaccine at local pharmacy or Health Dept. Verbalized acceptance and understanding.  Screening Tests Health Maintenance  Topic Date Due   Pneumonia Vaccine 75+ Years old (1 - PCV) Never done   Hepatitis C Screening  Never done   TETANUS/TDAP  Never done   Zoster Vaccines- Shingrix (1 of 2) Never done   COLONOSCOPY (Pts 45-21yr Insurance coverage will need to be confirmed)  Never done   COVID-19 Vaccine (4 - Booster for Moderna series) 05/09/2020   INFLUENZA VACCINE  11/12/2021   HPV VACCINES  Aged Out    Health Maintenance  Health Maintenance Due  Topic Date Due   Pneumonia Vaccine 71 Years old (1 - PCV) Never done   Hepatitis C Screening  Never done   TETANUS/TDAP  Never done   Zoster Vaccines- Shingrix (1 of 2) Never done   COLONOSCOPY (Pts 45-42yrInsurance coverage will need to be confirmed)  Never done   COVID-19 Vaccine (4 - Booster for  Moderna series) 05/09/2020    Colorectal cancer screening: Referral to GI placed patient declined. Pt aware the office will call re: appt.  Lung Cancer Screening: (Low Dose CT Chest recommended if Age 51-80 years, 30 pack-year currently smoking OR have quit w/in 15years.) does  qualify.   Lung Cancer Screening Referral: would like to discuss with provider  Additional Screening:  Hepatitis C Screening: does qualify; Completed   Vision Screening: Recommended annual ophthalmology exams for early detection of glaucoma and other disorders of the eye. Is the patient up to date with their annual eye exam?  No  Who is the provider or what is the name of the office in which the patient attends annual eye exams? [facility] If pt is not established with a provider, would they like to be referred to a provider to establish care? No .   Dental Screening: Recommended annual dental exams for proper oral hygiene  Community Resource Referral / Chronic Care Management: CRR required this visit?  No   CCM required this visit?  No      Plan:     I have personally reviewed and noted the following in the patient's chart:   Medical and social history Use of alcohol, tobacco or illicit drugs  Current medications and supplements including opioid prescriptions. Patient is currently taking opioid prescriptions. Information provided to patient regarding non-opioid alternatives. Patient advised to discuss non-opioid treatment plan with their provider. Functional ability and status Nutritional status Physical activity Advanced directives List of other physicians Hospitalizations, surgeries, and ER visits in previous 12 months Vitals Screenings to include cognitive, depression, and falls Referrals and appointments  In addition, I have reviewed and discussed with patient certain preventive protocols, quality metrics, and best practice recommendations. A written personalized care plan for preventive services as well as general preventive health recommendations were provided to patient.     Shelda Altes, CMA   11/05/2021   Nurse Notes:  Jesus Rodriguez , Thank you for taking time to come for your Medicare Wellness Visit. I appreciate your ongoing commitment to your  health goals. Please review the following plan we discussed and let me know if I can assist you in the future.   These are the goals we discussed:  Goals   None     This is a list of the screening recommended for you and due dates:  Health Maintenance  Topic Date Due   Pneumonia Vaccine (1 - PCV) Never done   Hepatitis C Screening: USPSTF Recommendation to screen - Ages 31-79 yo.  Never done   Tetanus Vaccine  Never done   Zoster (Shingles) Vaccine (1 of 2) Never done   Colon Cancer Screening  Never done   COVID-19 Vaccine (4 - Booster for Moderna series) 05/09/2020   Flu Shot  11/12/2021   HPV Vaccine  Aged Out

## 2021-11-05 NOTE — Addendum Note (Signed)
Addended by: Levonne Hubert on: 11/05/2021 09:19 AM   Modules accepted: Orders

## 2021-11-05 NOTE — Patient Instructions (Signed)
Jesus Rodriguez , Thank you for taking time to come for your Medicare Wellness Visit. I appreciate your ongoing commitment to your health goals. Please review the following plan we discussed and let me know if I can assist you in the future.   Screening recommendations/referrals: Colonoscopy: declined Recommended yearly ophthalmology/optometry visit for glaucoma screening and checkup Recommended yearly dental visit for hygiene and checkup  Vaccinations: Influenza vaccine: completed Pneumococcal vaccine: completed per patient Tdap vaccine: due now Shingles vaccine: due now    Advanced directives: declined information  Conditions/risks identified: Falls, Hypertension  Next appointment: 1 year  Preventive Care 19 Years and Older, Male Preventive care refers to lifestyle choices and visits with your health care provider that can promote health and wellness. What does preventive care include? A yearly physical exam. This is also called an annual well check. Dental exams once or twice a year. Routine eye exams. Ask your health care provider how often you should have your eyes checked. Personal lifestyle choices, including: Daily care of your teeth and gums. Regular physical activity. Eating a healthy diet. Avoiding tobacco and drug use. Limiting alcohol use. Practicing safe sex. Taking low doses of aspirin every day. Taking vitamin and mineral supplements as recommended by your health care provider. What happens during an annual well check? The services and screenings done by your health care provider during your annual well check will depend on your age, overall health, lifestyle risk factors, and family history of disease. Counseling  Your health care provider may ask you questions about your: Alcohol use. Tobacco use. Drug use. Emotional well-being. Home and relationship well-being. Sexual activity. Eating habits. History of falls. Memory and ability to understand  (cognition). Work and work Statistician. Screening  You may have the following tests or measurements: Height, weight, and BMI. Blood pressure. Lipid and cholesterol levels. These may be checked every 5 years, or more frequently if you are over 32 years old. Skin check. Lung cancer screening. You may have this screening every year starting at age 62 if you have a 30-pack-year history of smoking and currently smoke or have quit within the past 15 years. Fecal occult blood test (FOBT) of the stool. You may have this test every year starting at age 22. Flexible sigmoidoscopy or colonoscopy. You may have a sigmoidoscopy every 5 years or a colonoscopy every 10 years starting at age 46. Prostate cancer screening. Recommendations will vary depending on your family history and other risks. Hepatitis C blood test. Hepatitis B blood test. Sexually transmitted disease (STD) testing. Diabetes screening. This is done by checking your blood sugar (glucose) after you have not eaten for a while (fasting). You may have this done every 1-3 years. Abdominal aortic aneurysm (AAA) screening. You may need this if you are a current or former smoker. Osteoporosis. You may be screened starting at age 18 if you are at high risk. Talk with your health care provider about your test results, treatment options, and if necessary, the need for more tests. Vaccines  Your health care provider may recommend certain vaccines, such as: Influenza vaccine. This is recommended every year. Tetanus, diphtheria, and acellular pertussis (Tdap, Td) vaccine. You may need a Td booster every 10 years. Zoster vaccine. You may need this after age 29. Pneumococcal 13-valent conjugate (PCV13) vaccine. One dose is recommended after age 68. Pneumococcal polysaccharide (PPSV23) vaccine. One dose is recommended after age 27. Talk to your health care provider about which screenings and vaccines you need and how often you  need them. This  information is not intended to replace advice given to you by your health care provider. Make sure you discuss any questions you have with your health care provider. Document Released: 04/27/2015 Document Revised: 12/19/2015 Document Reviewed: 01/30/2015 Elsevier Interactive Patient Education  2017 [employer] Prevention in the Home Falls can cause injuries. They can happen to people of all ages. There are many things you can do to make your home safe and to help prevent falls. What can I do on the outside of my home? Regularly fix the edges of walkways and driveways and fix any cracks. Remove anything that might make you trip as you walk through a door, such as a raised step or threshold. Trim any bushes or trees on the path to your home. Use bright outdoor lighting. Clear any walking paths of anything that might make someone trip, such as rocks or tools. Regularly check to see if handrails are loose or broken. Make sure that both sides of any steps have handrails. Any raised decks and porches should have guardrails on the edges. Have any leaves, snow, or ice cleared regularly. Use sand or salt on walking paths during winter. Clean up any spills in your garage right away. This includes oil or grease spills. What can I do in the bathroom? Use night lights. Install grab bars by the toilet and in the tub and shower. Do not use towel bars as grab bars. Use non-skid mats or decals in the tub or shower. If you need to sit down in the shower, use a plastic, non-slip stool. Keep the floor dry. Clean up any water that spills on the floor as soon as it happens. Remove soap buildup in the tub or shower regularly. Attach bath mats securely with double-sided non-slip rug tape. Do not have throw rugs and other things on the floor that can make you trip. What can I do in the bedroom? Use night lights. Make sure that you have a light by your bed that is easy to reach. Do not use any sheets or  blankets that are too big for your bed. They should not hang down onto the floor. Have a firm chair that has side arms. You can use this for support while you get dressed. Do not have throw rugs and other things on the floor that can make you trip. What can I do in the kitchen? Clean up any spills right away. Avoid walking on wet floors. Keep items that you use a lot in easy-to-reach places. If you need to reach something above you, use a strong step stool that has a grab bar. Keep electrical cords out of the way. Do not use floor polish or wax that makes floors slippery. If you must use wax, use non-skid floor wax. Do not have throw rugs and other things on the floor that can make you trip. What can I do with my stairs? Do not leave any items on the stairs. Make sure that there are handrails on both sides of the stairs and use them. Fix handrails that are broken or loose. Make sure that handrails are as long as the stairways. Check any carpeting to make sure that it is firmly attached to the stairs. Fix any carpet that is loose or worn. Avoid having throw rugs at the top or bottom of the stairs. If you do have throw rugs, attach them to the floor with carpet tape. Make sure that you have a light switch  at the top of the stairs and the bottom of the stairs. If you do not have them, ask someone to add them for you. What else can I do to help prevent falls? Wear shoes that: Do not have high heels. Have rubber bottoms. Are comfortable and fit you well. Are closed at the toe. Do not wear sandals. If you use a stepladder: Make sure that it is fully opened. Do not climb a closed stepladder. Make sure that both sides of the stepladder are locked into place. Ask someone to hold it for you, if possible. Clearly mark and make sure that you can see: Any grab bars or handrails. First and last steps. Where the edge of each step is. Use tools that help you move around (mobility aids) if they are  needed. These include: Canes. Walkers. Scooters. Crutches. Turn on the lights when you go into a dark area. Replace any light bulbs as soon as they burn out. Set up your furniture so you have a clear path. Avoid moving your furniture around. If any of your floors are uneven, fix them. If there are any pets around you, be aware of where they are. Review your medicines with your doctor. Some medicines can make you feel dizzy. This can increase your chance of falling. Ask your doctor what other things that you can do to help prevent falls. This information is not intended to replace advice given to you by your health care provider. Make sure you discuss any questions you have with your health care provider. Document Released: 01/25/2009 Document Revised: 09/06/2015 Document Reviewed: 05/05/2014 Elsevier Interactive Patient Education  2017 [employer].

## 2021-11-06 ENCOUNTER — Ambulatory Visit (HOSPITAL_COMMUNITY): Payer: Medicare Other | Admitting: Physical Therapy

## 2021-11-07 ENCOUNTER — Other Ambulatory Visit: Payer: Self-pay | Admitting: Cardiology

## 2021-11-08 NOTE — Telephone Encounter (Signed)
Please encourage the patient to take magnesium '200mg'$  daily to help with hand tremors.

## 2021-11-08 NOTE — Telephone Encounter (Signed)
Pt informed states understanding 

## 2021-11-28 ENCOUNTER — Other Ambulatory Visit: Payer: Self-pay | Admitting: Cardiology

## 2021-12-03 ENCOUNTER — Telehealth (HOSPITAL_COMMUNITY): Payer: Self-pay | Admitting: Physical Therapy

## 2021-12-03 ENCOUNTER — Ambulatory Visit (HOSPITAL_COMMUNITY): Payer: Medicare Other | Admitting: Physical Therapy
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
## 2021-12-09 ENCOUNTER — Ambulatory Visit (HOSPITAL_COMMUNITY): Payer: Medicare Other | Admitting: Physical Therapy

## 2021-12-11 ENCOUNTER — Ambulatory Visit: Payer: Medicare Other

## 2021-12-18 ENCOUNTER — Ambulatory Visit: Payer: Medicare Other | Attending: Cardiology | Admitting: *Deleted

## 2021-12-18 DIAGNOSIS — Z5181 Encounter for therapeutic drug level monitoring: Secondary | ICD-10-CM

## 2021-12-18 DIAGNOSIS — I4891 Unspecified atrial fibrillation: Secondary | ICD-10-CM

## 2021-12-18 LAB — POCT INR: INR: 2.9 (ref 2.0–3.0)

## 2021-12-18 NOTE — Patient Instructions (Signed)
Continue warfarin 1 tablet daily except for 1/2  tablet on Sunday, Tuesdays and Thursdays. Recheck INR 7 weeks.

## 2021-12-20 ENCOUNTER — Telehealth: Payer: Medicare Other | Admitting: Family Medicine

## 2021-12-20 ENCOUNTER — Encounter: Payer: Self-pay | Admitting: Family Medicine

## 2021-12-20 DIAGNOSIS — H109 Unspecified conjunctivitis: Secondary | ICD-10-CM | POA: Diagnosis not present

## 2021-12-20 MED ORDER — POLYMYXIN B-TRIMETHOPRIM 10000-0.1 UNIT/ML-% OP SOLN: OPHTHALMIC | 0 refills | Status: DC

## 2021-12-20 NOTE — Progress Notes (Signed)
Virtual Visit via Telephone Note   This visit type was conducted due to national recommendations for restrictions regarding the COVID-19 Pandemic (e.g. social distancing) in an effort to limit this patient's exposure and mitigate transmission in our community.  Due to his co-morbid illnesses, this patient is at least at moderate risk for complications without adequate follow up.  This format is felt to be most appropriate for this patient at this time.  The patient did not have access to video technology/had technical difficulties with video requiring transitioning to audio format only (telephone).  All issues noted in this document were discussed and addressed.  No physical exam could be performed with this format.  Please refer to the patient's chart for his  consent to telehealth for [facility].   Evaluation Performed:  Follow-up visit  Date:  12/20/2021   ID:  Jesus Rodriguez, DOB [birth year], MRN [id number]  Patient Location: Home Provider Location: Office/Clinic  Participants: Patient Location of Patient: Home Location of Provider: Telehealth Consent was obtain for visit to be over via telehealth. I verified that I am speaking with the correct person using two identifiers.  PCP:  Alvira Monday, FNP   Chief Complaint:    History of Present Illness:    Jesus Rodriguez is a 71 y.o. male  with c/o of a nontender, soft lump on his eyebrows that stings with touch. The lump is without redness and warmth. No recent injury or trauma reported. He reports wearing two face masks at bedtime, which he attributes to the lump development. He c/o of matted eyes in the morning for 3 days. He denies eye redness and purulent discharge.  The patient does not have symptoms concerning for COVID-19 infection (fever, chills, cough, or new shortness of breath).   Past Medical, Surgical, Social History, Allergies, and Medications have been Reviewed.  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   albuterol (VENTOLIN HFA) 108 (90 Base) MCG/ACT inhaler Inhale 1 puff into the lungs every 6 (six) hours as needed.   benazepril (LOTENSIN) 5 MG tablet Take 1 tablet (5 mg total) by mouth daily.   ketoconazole (NIZORAL) 2 % cream Apply 1 application topically 2 (two) times daily as needed for irritation.   metoprolol tartrate (LOPRESSOR) 100 MG tablet Take 1 tablet (100 mg total) by mouth 2 (two) times daily.   simvastatin (ZOCOR) 20 MG tablet TAKE 1 TABLET BY MOUTH DAILY AT 6 PM.   torsemide (DEMADEX) 20 MG tablet TAKE 3 TABLETS (60 MG TOTAL) BY MOUTH 2 (TWO) TIMES DAILY.  TRELEGY ELLIPTA 100-62.5-25 MCG/INH AEPB Inhale 1 puff into the lungs daily.   triamcinolone (KENALOG) 0.1 % Apply 1 application topically 3 (three) times daily.   trimethoprim-polymyxin b (POLYTRIM) ophthalmic solution Apply 1 to 2 drops 4 times daily for 5 to 7 days   UNABLE TO FIND Med Name: bariatric commode E66.01   UNABLE TO FIND Med Name: bariatric wheelchair E66.01   warfarin (COUMADIN) 10 MG tablet Take 0.5-1 tablets (5-10 mg total) by mouth daily. Tue  thur and Sunday take '5mg'$  and mon, wed, fri, and sat take '10mg'$      Allergies:   Celebrex [celecoxib], Methotrexate derivatives, Prednisone, and Sulfa antibiotics   ROS:   Please see the history of present illness.     All other systems reviewed and are negative.   Labs/Other Tests and Data Reviewed:    Recent Labs: 09/17/2021: ALT 11 09/19/2021: Magnesium 2.5 10/03/2021: B Natriuretic Peptide 113.0; BUN 20; Creatinine, Ser 0.90; Hemoglobin 11.7; Platelets 165; Potassium 4.3; Sodium 140   Recent Lipid Panel No results found for: "CHOL", "TRIG", "HDL", "CHOLHDL", "LDLCALC", "LDLDIRECT"  Wt Readings from Last 3 Encounters:  10/23/21 (!) 381 lb 3.2 oz (172.9 kg)  10/21/21 (!) 383 lb 3.2 oz (173.8 kg)  10/03/21 (!) 371 lb (168.3 kg)     Objective:    Vital Signs:  There were no vitals taken for this visit.     ASSESSMENT & PLAN:   Bacterial conjunctivitis I encouraged the patient to take a picture of the lump on his eyebrows and sent it in mychart so I could review Will treat for bacterial conjunctivitis with polytrim ophthalmic solution  Time:   Today, I have spent 8 minutes reviewing the chart, including problem list, medications, and with the patient with telehealth technology discussing the above problems.   Medication Adjustments/Labs and Tests Ordered: Current medicines are reviewed at length with the patient today.  Concerns regarding medicines are outlined above.   Tests Ordered: No orders of the defined types were placed in this encounter.   Medication Changes: Meds ordered this encounter  Medications   trimethoprim-polymyxin b (POLYTRIM) ophthalmic solution    Sig: Apply 1 to 2 drops 4 times daily for 5 to 7 days    Dispense:  10 mL    Refill:  0     Note: This dictation was prepared with Dragon dictation along with smaller phrase technology. Similar sounding words can be transcribed inadequately or may not be corrected upon review. Any transcriptional errors  that result from this process are unintentional.      Disposition:  Follow up  Signed, Alvira Monday, FNP  12/20/2021 10:24 PM     [facility]

## 2021-12-30 ENCOUNTER — Encounter: Payer: Self-pay | Admitting: Family Medicine

## 2021-12-30 ENCOUNTER — Ambulatory Visit: Payer: Medicare Other | Admitting: Family Medicine

## 2021-12-30 DIAGNOSIS — J441 Chronic obstructive pulmonary disease with (acute) exacerbation: Secondary | ICD-10-CM

## 2021-12-30 MED ORDER — AMOXICILLIN-POT CLAVULANATE 875-125 MG PO TABS: 1.0000 | ORAL_TABLET | Freq: Two times a day (BID) | ORAL | 0 refills | Status: DC

## 2021-12-30 NOTE — Progress Notes (Signed)
Virtual Visit via Telephone Note   This visit type was conducted due to national recommendations for restrictions regarding the COVID-19 Pandemic (e.g. social distancing) in an effort to limit this patient's exposure and mitigate transmission in our community.  Due to his co-morbid illnesses, this patient is at least at moderate risk for complications without adequate follow up.  This format is felt to be most appropriate for this patient at this time.  The patient did not have access to video technology/had technical difficulties with video requiring transitioning to audio format only (telephone).  All issues noted in this document were discussed and addressed.  No physical exam could be performed with this format.  Please refer to the patient's chart for his  consent to telehealth for [facility].   Evaluation Performed:  Follow-up visit  Date:  12/30/2021   ID:  Jesus Rodriguez, DOB 10/26/1950, MRN [id number]  Patient Location: Home Provider Location: Office/Clinic  Participants: Patient Location of Patient: Home Location of Provider: Telehealth Consent was obtain for visit to be over via telehealth. I verified that I am speaking with the correct person using two identifiers.  PCP:  Alvira Monday, FNP   Chief Complaint:  increased sputum  History of Present Illness:    Jesus Rodriguez is a 71 y.o. male with c/o of increased SOB, cough, and sputum production. He denies fever but reports having chills occasionally. The onset of symptoms for 1 week. He reports taking OTC mucinex with minimum relief of symptoms.No recent sick contacts reported.    The patient does have symptoms concerning for COVID-19 infection (fever, chills, cough, or new shortness of breath).   Past Medical, Surgical, Social History, Allergies, and Medications have been Reviewed.  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   albuterol (VENTOLIN HFA) 108 (90 Base) MCG/ACT inhaler Inhale 1 puff into the lungs every 6 (six) hours as needed.   amoxicillin-clavulanate (AUGMENTIN) 875-125 MG tablet Take 1 tablet by mouth 2 (two) times daily for 5 days.   benazepril (LOTENSIN) 5 MG tablet Take 1 tablet (5 mg total) by mouth daily.   ketoconazole (NIZORAL) 2 % cream Apply 1 application topically 2 (two) times daily as needed for irritation.   metoprolol tartrate (LOPRESSOR) 100 MG tablet Take 1 tablet (100 mg total) by mouth 2 (two) times daily.   simvastatin (ZOCOR) 20 MG tablet TAKE 1 TABLET BY MOUTH DAILY AT 6 PM.   torsemide (DEMADEX) 20 MG tablet TAKE 3 TABLETS (60 MG TOTAL) BY MOUTH 2 (TWO) TIMES DAILY.  TRELEGY ELLIPTA 100-62.5-25 MCG/INH AEPB Inhale 1 puff into the lungs daily.   triamcinolone (KENALOG) 0.1 % Apply 1 application topically 3 (three) times daily.   trimethoprim-polymyxin b (POLYTRIM) ophthalmic solution Apply 1 to 2 drops 4 times daily for 5 to 7 days   UNABLE TO FIND Med Name: bariatric commode E66.01   UNABLE TO FIND Med Name: bariatric wheelchair E66.01   warfarin (COUMADIN) 10 MG tablet Take 0.5-1 tablets  (5-10 mg total) by mouth daily. Tue thur and Sunday take '5mg'$  and mon, wed, fri, and sat take '10mg'$      Allergies:   Celebrex [celecoxib], Methotrexate derivatives, Prednisone, and Sulfa antibiotics   ROS:   Please see the history of present illness.     All other systems reviewed and are negative.   Labs/Other Tests and Data Reviewed:    Recent Labs: 09/17/2021: ALT 11 09/19/2021: Magnesium 2.5 10/03/2021: B Natriuretic Peptide 113.0; BUN 20; Creatinine, Ser 0.90; Hemoglobin 11.7; Platelets 165; Potassium 4.3; Sodium 140   Recent Lipid Panel No results found for: "CHOL", "TRIG", "HDL", "CHOLHDL", "LDLCALC", "LDLDIRECT"  Wt Readings from Last 3 Encounters:  10/23/21 (!) 381 lb 3.2 oz (172.9 kg)  10/21/21 (!) 383 lb 3.2 oz (173.8 kg)  10/03/21 (!) 371 lb (168.3 kg)     Objective:    Vital Signs:  There were no vitals taken for this visit.     ASSESSMENT & PLAN:   COPD with exacerbation The patient has allergies to prednisone; therefore, he cannot receive steroids treated Will be treated with Augmentin PT can not take azithromycin or  second and third-generation cephalosporin because he's on warfarin therapy He noted that he does not need refills for his inhaler Encouraged  to use the daily inhaler, trelegy   Time:   Today, I have spent 8 minutes reviewing the chart, including problem list, medications, and with the patient with telehealth technology discussing the above problems.   Medication Adjustments/Labs and Tests Ordered: Current medicines are reviewed at length with the patient today.  Concerns regarding medicines are outlined above.   Tests Ordered: No orders of the defined types were placed in this encounter.   Medication Changes: Meds ordered this encounter  Medications   amoxicillin-clavulanate (AUGMENTIN) 875-125 MG tablet    Sig: Take 1 tablet by mouth 2 (two) times daily for 5 days.    Dispense:  10 tablet    Refill:  0     Note: This dictation was  prepared with Dragon dictation along with smaller phrase technology. Similar sounding words can be transcribed inadequately or may not be corrected upon review. Any transcriptional errors that result from this process are unintentional.      Disposition:  Follow up  Signed, Alvira Monday, FNP  12/30/2021 1:21 PM     [facility]

## 2022-01-01 ENCOUNTER — Telehealth: Payer: Self-pay | Admitting: Family Medicine

## 2022-01-01 ENCOUNTER — Other Ambulatory Visit: Payer: Self-pay | Admitting: Family Medicine

## 2022-01-01 DIAGNOSIS — J441 Chronic obstructive pulmonary disease with (acute) exacerbation: Secondary | ICD-10-CM

## 2022-01-01 MED ORDER — CEFDINIR 300 MG PO CAPS: 300.0000 mg | ORAL_CAPSULE | Freq: Two times a day (BID) | ORAL | 0 refills | Status: AC

## 2022-01-01 NOTE — Telephone Encounter (Signed)
I've d/c his Augmentin and started him on Cefdinir. Prescription sent to his pharmacy

## 2022-01-01 NOTE — Telephone Encounter (Signed)
Spoke to pts daughter states he has been having side effects to amoxicillin has not stopped medication yet wanted to get advice from you first please advice?

## 2022-01-01 NOTE — Telephone Encounter (Signed)
Called in on patient behalf. Patient has been having erratic and delusional behavior since starting antibiotic amoxicillin-clavulanate (AUGMENTIN) 875-125 MG tablet  Want to see if provider will change the antibiotic    Also wants a call back.

## 2022-01-01 NOTE — Telephone Encounter (Signed)
Pt has been informed.

## 2022-01-03 DIAGNOSIS — I509 Heart failure, unspecified: Secondary | ICD-10-CM | POA: Insufficient documentation

## 2022-01-06 DIAGNOSIS — E662 Morbid (severe) obesity with alveolar hypoventilation: Secondary | ICD-10-CM | POA: Insufficient documentation

## 2022-01-13 ENCOUNTER — Ambulatory Visit: Payer: Medicare Other | Admitting: Pulmonary Disease

## 2022-01-14 ENCOUNTER — Telehealth: Payer: Self-pay | Admitting: Family Medicine

## 2022-01-14 NOTE — Telephone Encounter (Signed)
[facility] called in on patient behalf.  Patient was admitted on 9/28, patient refused therapy. Discharged today 10/3    TOC appt made   TOC call needed

## 2022-01-15 ENCOUNTER — Telehealth: Payer: Self-pay

## 2022-01-15 NOTE — Telephone Encounter (Signed)
Transition Care Management Follow-up Telephone Call Date of discharge and from where: 10/03 [facility] How have you been since you were released from the hospital? Pt feels fine.  Any questions or concerns? No  Items Reviewed: Did the pt receive and understand the discharge instructions provided? Yes  Medications obtained and verified? Yes  Other? Yes  Any new allergies since your discharge? No  Dietary orders reviewed? Yes Do you have support at home? Yes   Home Care and Equipment/Supplies: Were home health services ordered? Yes If so, what is the name of the agency? N/a  Has the agency set up a time to come to the patient's home? no Were any new equipment or medical supplies ordered?  yes What is the name of the medical supply agency? [employer] Were you able to get the supplies/equipment? Working on getting one with an agency.  Do you have any questions related to the use of the equipment or supplies? No  Functional Questionnaire: (I = Independent and D = Dependent) ADLs: I  Bathing/Dressing- I   Meal Prep- I  Eating- I  Maintaining continence- I  Transferring/Ambulation- I  Managing Meds- I  Follow up appointments reviewed:  PCP Hospital f/u appt confirmed? Yes  Scheduled to see Peter Congo on 10/10/ . [facility] f/u appt confirmed? No  Scheduled to see n/a. Are transportation arrangements needed? No  If their condition worsens, is the pt aware to call PCP or go to the Emergency Dept.? Yes Was the patient provided with contact information for the PCP's office or ED? Yes Was to pt encouraged to call back with questions or concerns? Yes

## 2022-01-15 NOTE — Telephone Encounter (Signed)
Toc call completed

## 2022-01-21 ENCOUNTER — Inpatient Hospital Stay: Payer: Medicare Other | Admitting: Family Medicine

## 2022-01-21 ENCOUNTER — Encounter: Payer: Self-pay | Admitting: Family Medicine

## 2022-01-28 ENCOUNTER — Ambulatory Visit: Payer: Medicare Other | Attending: Cardiology | Admitting: Cardiology

## 2022-01-28 ENCOUNTER — Ambulatory Visit (INDEPENDENT_AMBULATORY_CARE_PROVIDER_SITE_OTHER): Payer: Medicare Other | Admitting: *Deleted

## 2022-01-28 ENCOUNTER — Encounter: Payer: Self-pay | Admitting: Cardiology

## 2022-01-28 VITALS — BP 110/68 | HR 91 | Ht 74.0 in | Wt 363.4 lb

## 2022-01-28 DIAGNOSIS — I4891 Unspecified atrial fibrillation: Secondary | ICD-10-CM | POA: Diagnosis not present

## 2022-01-28 DIAGNOSIS — Z5181 Encounter for therapeutic drug level monitoring: Secondary | ICD-10-CM | POA: Diagnosis not present

## 2022-01-28 DIAGNOSIS — I5032 Chronic diastolic (congestive) heart failure: Secondary | ICD-10-CM | POA: Diagnosis not present

## 2022-01-28 DIAGNOSIS — I10 Essential (primary) hypertension: Secondary | ICD-10-CM | POA: Diagnosis not present

## 2022-01-28 LAB — POCT INR: INR: 2.3 (ref 2.0–3.0)

## 2022-01-28 MED ORDER — APIXABAN 5 MG PO TABS: 5.0000 mg | ORAL_TABLET | Freq: Two times a day (BID) | ORAL | 6 refills | Status: DC

## 2022-01-28 MED ORDER — ATORVASTATIN CALCIUM 10 MG PO TABS: 10.0000 mg | ORAL_TABLET | Freq: Every day | ORAL | 6 refills | Status: DC

## 2022-01-28 NOTE — Progress Notes (Signed)
Clinical Summary Mr. Flegel is a 71 y.o.male seen today for follow up of the following medical problems.      1. Afib and flutter - notes indicate history of typical flutter s/p CTI ablation 03/2016 - history of afib Preiviously followed by [facility] - failed sotalol, started on dofetilide, followed by EP Dr Rayann Heman -prolonged QTc on dofetilide       - tried higher dose of lopressor '100mg'$  bid, and off benazepril. - occasional palptations, about 1-2 times per week. Lasts a few minutes.    2.Chronic diastolic HF -05/4578 echo LVEF 55-60%,      - taking torsemide '60mg'$  bid - office weight 09/06/19 was 396 lbs.  - home weight 388-393 lbs - compliant with diuretic  - weight down 381 to 363 from July to October - no recent edema. Chronic stable on 4L [place] constant   3. COPD - followed by Dr Halford Chessman     4. 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     albuterol (PROVENTIL) (2.5 MG/3ML) 0.083% nebulizer solution Take 3 mLs (2.5 mg total) by nebulization every 6 (six) hours as needed for wheezing or shortness of breath. 360 mL 0   albuterol  (VENTOLIN HFA) 108 (90 Base) MCG/ACT inhaler Inhale 1 puff into the lungs every 6 (six) hours as needed. 8 g 5   benazepril (LOTENSIN) 5 MG tablet Take 1 tablet (5 mg total) by mouth daily. 90 tablet 3   ketoconazole (NIZORAL) 2 % cream Apply 1 application topically 2 (two) times daily as needed for irritation.     metoprolol tartrate (LOPRESSOR) 100 MG tablet Take 1 tablet (100 mg total) by mouth 2 (two) times daily. 180 tablet 3   simvastatin (ZOCOR) 20 MG tablet TAKE 1 TABLET BY MOUTH DAILY AT 6 PM. 90 tablet 1   torsemide (DEMADEX) 20 MG tablet TAKE 3 TABLETS (60 MG TOTAL) BY MOUTH 2 (TWO) TIMES DAILY. 540 tablet 0   TRELEGY ELLIPTA 100-62.5-25 MCG/INH AEPB Inhale 1 puff into the lungs daily.     triamcinolone (KENALOG) 0.1 % Apply 1 application topically 3 (three) times daily.     trimethoprim-polymyxin b (POLYTRIM) ophthalmic solution Apply 1 to 2 drops 4 times daily for 5 to 7 days 10 mL 0   UNABLE TO FIND Med Name: bariatric commode E66.01 1 each 0   UNABLE TO FIND Med Name: bariatric wheelchair E66.01 1 each 0   warfarin (COUMADIN) 10 MG tablet Take 0.5-1 tablets (5-10 mg total) by mouth daily. Tue  thur and Sunday take '5mg'$  and mon, wed, fri, and sat take '10mg'$      No current facility-administered medications for this visit.     Past Surgical History:  Procedure Laterality Date   A-FLUTTER ABLATION     at [facility] 2018   CARDIOVERSION  02/10/2018   CARDIOVERSION N/A 05/13/2018   Procedure: CARDIOVERSION;  Surgeon: Sanda Klein, MD;  [facility];  Service: Cardiovascular;  Laterality: N/A;   CATARACT EXTRACTION       Allergies  Allergen Reactions   Celebrex [Celecoxib]    Methotrexate Derivatives Other (See Comments)    Arrhythmia    Prednisone Other (See Comments)    Throwing up   Sulfa Antibiotics       Family History  Problem Relation Age of Onset   Hypertension Father    Heart attack Father    Prostate cancer Father    Hypertension Sister     Hypertension Sister    Hypertension Sister    Epilepsy Son    Diabetes Son      Social History Mr. Hattabaugh reports that he has been smoking cigarettes. He started smoking about 50 years ago. He has a 70.00 pack-year smoking history. He has never used smokeless tobacco. Mr. Berisha reports no history of alcohol use.   Review of Systems CONSTITUTIONAL: No weight loss, fever, chills, weakness or fatigue.  HEENT: Eyes: No visual loss, blurred vision, double vision or yellow sclerae.No hearing loss, sneezing, congestion, runny nose or sore throat.  SKIN: No rash or itching.  CARDIOVASCULAR: per hpi RESPIRATORY: chronic SOB GASTROINTESTINAL: No anorexia, nausea, vomiting or diarrhea. No abdominal pain or blood.  GENITOURINARY: No burning on urination, no polyuria NEUROLOGICAL: No headache, dizziness, syncope, paralysis, ataxia, numbness or tingling in the extremities. No change in bowel or bladder control.  MUSCULOSKELETAL: No muscle, back pain, joint pain or stiffness.  LYMPHATICS: No enlarged nodes. No history of splenectomy.  PSYCHIATRIC: No history of depression or anxiety.  ENDOCRINOLOGIC: No reports of sweating, cold or heat intolerance. No polyuria or polydipsia.  Marland Kitchen   Physical Examination Today's Vitals   01/28/22 1501  BP: 110/68  Pulse: 91  SpO2: 93%  Weight: (!) 363 lb 6.4 oz (164.8 kg)  Height: '6\' 2"'$  (1.88 m)   Body mass index is 46.66 kg/m.  Gen: resting comfortably, no acute distress HEENT: no scleral icterus, pupils equal round and reactive, no palptable cervical adenopathy,  CV: irreg, no m/r/g no jvd Resp: Clear to auscultation bilaterally GI: abdomen is soft, non-tender, non-distended, normal bowel sounds, no hepatosplenomegaly MSK: extremities are warm, no edema.  Skin: warm, no rash Neuro:  no focal deficits Psych: appropriate affect   Diagnostic Studies  10/2018 echo  1. Images are limited.  2. The left ventricle has grossly normal systolic function,  with an ejection fraction of 55-60%. The cavity size was normal. There is mildly increased left ventricular wall thickness. Left ventricular diastolic Doppler parameters are indeterminate.  3. The right ventricle has normal systolic function. The cavity was normal. There is no increase in right ventricular wall thickness. Right ventricular systolic pressure is normal with an estimated pressure of 12.5 mmHg.  4. The aortic valve is tricuspid. Mild calcification of the aortic valve. Mild aortic annular calcification noted.  5. The mitral valve is grossly normal. There is mild mitral annular calcification present.  6. The tricuspid valve is grossly normal.  7. The aorta is abnormal in size and structure.  8. There is mild dilatation of the  aortic root.  9. The interatrial septum was not well visualized.   Assessment and Plan   1.Afib/aflutter - no symptoms, continue current meds - interested in changing coumadin to DOAC, will stop coumad in wait 3 days then start eliquis '5mg'$  bid  2. Chronic diastolic HF - euvolemic today, continue torsemide.   3. HTN - off benazepril due to low bp's, required higher dosing of lopressor.   F/u 77month   JArnoldo Lenis M.D.

## 2022-01-28 NOTE — Patient Instructions (Signed)
Continue warfarin 1 tablet daily except for 1/2  tablet on Sunday, Tuesdays and Thursdays. Recheck INR 7 weeks.

## 2022-01-28 NOTE — Patient Instructions (Addendum)
Medication Instructions:  Stop Warfarin (Coumadin) After being off of the Warfarin x 3 days, begin Eliquis '5mg'$  twice a day   Stop Benazepril (Lotensin) Stop Simvastatin (Zocor) Begin Atorvastatin (Lipitor) '10mg'$  daily  Continue all other medications.     Labwork: none  Testing/Procedures: none  Follow-Up: 6 months   Any Other Special Instructions Will Be Listed Below (If Applicable).   If you need a refill on your cardiac medications before your next appointment, please call your pharmacy.

## 2022-01-30 ENCOUNTER — Telehealth: Payer: Self-pay | Admitting: Pulmonary Disease

## 2022-01-30 ENCOUNTER — Other Ambulatory Visit: Payer: Self-pay | Admitting: Pulmonary Disease

## 2022-01-30 MED ORDER — TRELEGY ELLIPTA 100-62.5-25 MCG/ACT IN AEPB: 1.0000 | INHALATION_SPRAY | Freq: Every day | RESPIRATORY_TRACT | 6 refills | Status: DC

## 2022-01-30 NOTE — Telephone Encounter (Signed)
Trelegy refilled and sent to [facility].   ATC patient back to notify and discuss ov. LVM letting patient know that refill was sent and to call back if they were wanting to be seen sooner than January so I could help them with scheduling options. Nothing further needed at this time.

## 2022-02-03 ENCOUNTER — Other Ambulatory Visit: Payer: Self-pay | Admitting: Pulmonary Disease

## 2022-02-05 ENCOUNTER — Encounter
Payer: Medicare Other | Attending: Physical Medicine and Rehabilitation | Admitting: Physical Medicine and Rehabilitation

## 2022-02-05 ENCOUNTER — Encounter: Payer: Medicare Other | Admitting: Pulmonary Disease

## 2022-02-19 NOTE — Telephone Encounter (Signed)
Sorry for any confusion. Eliquis works very differently from coumadin and actually doesn't require any monitoring. It is just taken twice a day and the protection is as good and perhaps little bit better than coumadin. Patients still see Lattie Haw occasionally to make sure tolerating medication but does not require routine visits for monitoring  Zandra Abts MD

## 2022-03-11 ENCOUNTER — Other Ambulatory Visit: Payer: Self-pay | Admitting: Family Medicine

## 2022-03-11 DIAGNOSIS — J441 Chronic obstructive pulmonary disease with (acute) exacerbation: Secondary | ICD-10-CM

## 2022-04-01 ENCOUNTER — Other Ambulatory Visit: Payer: Self-pay | Admitting: Cardiology

## 2022-04-09 DIAGNOSIS — G9341 Metabolic encephalopathy: Secondary | ICD-10-CM | POA: Insufficient documentation

## 2022-04-10 DIAGNOSIS — I272 Pulmonary hypertension, unspecified: Secondary | ICD-10-CM | POA: Insufficient documentation

## 2022-04-10 DIAGNOSIS — E8729 Other acidosis: Secondary | ICD-10-CM | POA: Insufficient documentation

## 2022-04-11 ENCOUNTER — Other Ambulatory Visit: Payer: Self-pay

## 2022-04-21 ENCOUNTER — Ambulatory Visit: Payer: Medicare Other | Admitting: Pulmonary Disease

## 2022-05-01 ENCOUNTER — Other Ambulatory Visit: Payer: Self-pay | Admitting: Cardiology

## 2022-05-05 ENCOUNTER — Other Ambulatory Visit: Payer: Self-pay | Admitting: Cardiology

## 2022-05-06 ENCOUNTER — Telehealth: Payer: Self-pay

## 2022-05-06 NOTE — Telephone Encounter (Signed)
Transition Care Management Unsuccessful Follow-up Telephone Call  Date of discharge and from where:  [facility]  Attempts:  1st Attempt  Reason for unsuccessful TCM follow-up call:  Left voice message Juanda Crumble, [place] Direct Dial [phone]

## 2022-05-07 NOTE — Telephone Encounter (Signed)
Transition Care Management Unsuccessful Follow-up Telephone Call  Date of discharge and from where:  [facility] 05/05/2022  Attempts:  2nd Attempt  Reason for unsuccessful TCM follow-up call:  Missing or invalid number Juanda Crumble, [place] Direct Dial [phone]

## 2022-05-08 NOTE — Telephone Encounter (Signed)
Transition Care Management Follow-up Telephone Call Date of discharge and from where: [facility] 05/05/2022 How have you been since you were released from the hospital? better Any questions or concerns? No  Items Reviewed: Did the pt receive and understand the discharge instructions provided? Yes  Medications obtained and verified? Yes  Other? No  Any new allergies since your discharge? No  Dietary orders reviewed? Yes Do you have support at home? No   Home Care and Equipment/Supplies: Were home health services ordered? yes If so, what is the name of the agency? [facility]  Has the agency set up a time to come to the patient's home? No, patient declined, he has a friend who comes and helps Were any new equipment or medical supplies ordered?  Yes: O2 What is the name of the medical supply agency? [employer] Were you able to get the supplies/equipment? yes Do you have any questions related to the use of the equipment or supplies? No  Functional Questionnaire: (I = Independent and D = Dependent) ADLs: I  Bathing/Dressing- D  Meal Prep- D  Eating- I  Maintaining continence- D  Transferring/Ambulation- I  Managing Meds- I  Follow up appointments reviewed:  PCP Hospital f/u appt confirmed? No  no avail appts. Sent message to staff to schedule Specialist Hospital f/u appt confirmed? Yes  Scheduled to see Dr Halford Chessman on 05/19/2022 @ 3:00. Are transportation arrangements needed? No  If their condition worsens, is the pt aware to call PCP or go to the Emergency Dept.? Yes Was the patient provided with contact information for the PCP's office or ED? Yes Was to pt encouraged to call back with questions or concerns? Yes Juanda Crumble, LPN [place] Direct Dial [phone]

## 2022-05-14 ENCOUNTER — Inpatient Hospital Stay: Payer: Medicare Other | Admitting: Internal Medicine

## 2022-05-19 ENCOUNTER — Ambulatory Visit (HOSPITAL_BASED_OUTPATIENT_CLINIC_OR_DEPARTMENT_OTHER): Payer: Medicare Other | Admitting: Pulmonary Disease

## 2022-05-20 ENCOUNTER — Encounter: Payer: Self-pay | Admitting: Family Medicine

## 2022-06-03 ENCOUNTER — Ambulatory Visit: Payer: Medicare Other | Admitting: Pulmonary Disease

## 2022-06-03 ENCOUNTER — Encounter: Payer: Self-pay | Admitting: Pulmonary Disease

## 2022-06-03 VITALS — BP 136/76 | HR 84 | Ht 74.0 in

## 2022-06-03 DIAGNOSIS — J9611 Chronic respiratory failure with hypoxia: Secondary | ICD-10-CM | POA: Diagnosis not present

## 2022-06-03 DIAGNOSIS — J0100 Acute maxillary sinusitis, unspecified: Secondary | ICD-10-CM | POA: Diagnosis not present

## 2022-06-03 DIAGNOSIS — J9612 Chronic respiratory failure with hypercapnia: Secondary | ICD-10-CM

## 2022-06-03 DIAGNOSIS — J4489 Other specified chronic obstructive pulmonary disease: Secondary | ICD-10-CM

## 2022-06-03 MED ORDER — TRELEGY ELLIPTA 100-62.5-25 MCG/ACT IN AEPB: 1.0000 | INHALATION_SPRAY | Freq: Every day | RESPIRATORY_TRACT | 3 refills | Status: DC

## 2022-06-03 MED ORDER — AMOXICILLIN-POT CLAVULANATE 875-125 MG PO TABS: 1.0000 | ORAL_TABLET | Freq: Two times a day (BID) | ORAL | 0 refills | Status: DC

## 2022-06-03 NOTE — Patient Instructions (Signed)
Will get a copy of your Bipap report and have [employer] assess you for a portable oxygen concentrator.  Augmentin 1 pill twice per day for 7 days.  Follow up in 4 months.

## 2022-06-03 NOTE — Progress Notes (Signed)
[facility]  Chief Complaint  Patient presents with   Follow-up    No concerns     Past Surgical History:  He  has a past surgical history that includes A-FLUTTER ABLATION; Cardioversion (02/10/2018); Cataract extraction; and Cardioversion (N/A, 05/13/2018).  Past Medical History:  HTN, HLD, COPD, Chronic respiratory failure on 3 liters, Diastolic CHF,  A fib, RA, Depression, Scrotal cancer, Anxiety, OSA  Constitutional:  BP 136/76 (BP Location: Left Arm, Patient Position: Sitting)   Pulse 84   Ht 6' 2"$  (1.88 m)   SpO2 96% Comment: 4LO2 cont  BMI 46.66 kg/m   Brief Summary:  Jesus Rodriguez is a 72 y.o. male with former smoker with COPD and chronic respiratory failure.      Subjective:   He is here with his daughter.  He has been in and out of hospital several times.  Last time started on Bipap.  This has helped.  Using 4 liters oxygen.  Wants at [place].  Has sinus congestion, pressure and drainage for about 10 days.  Has cough with yellow sputum.   Physical Exam:   Appearance - well kempt   ENMT - maxillary sinus tenderness, no oral exudate, no LAN, Mallampati 4 airway, no stridor  Respiratory - equal breath sounds bilaterally, no wheezing or rales  CV - s1s2 regular rate and rhythm, no murmurs  Ext - no clubbing, no edema  Skin - no rashes  Psych - normal mood and affect    Pulmonary testing:  ABG 09/18/21 >> pH 7.52, PCO2 76, PO2 61  Chest Imaging:    Sleep Tests:    Cardiac Tests:  Echo 09/18/21 >> EF 55 to 60%, mild LVH, aortic root 40 mm  Social History:  He  reports that he quit smoking about 5 months ago. His smoking use included cigarettes. He started smoking about 51 years ago. He has a 70.00 pack-year smoking history. He has never been exposed to tobacco smoke. He has never used smokeless tobacco. He reports that he does not drink alcohol and does not use drugs.  Family History:  His family history  includes Diabetes in his son; Epilepsy in his son; Heart attack in his father; Hypertension in his father, sister, sister, and sister; Prostate cancer in his father.     Assessment/Plan:   Acute maxillary sinusitis. - will give course of augmentin  COPD with chronic bronchitis. - continue trelegy 100 one puff daily - prn albuterol - he has a nebulizer  Chronic respiratory failure with hypoxia and hypercapnia. - he uses [employer]  - continue 4 liters - will get copy of his Bipap and have him assessed for POC  Obesity. - he is aware of how his weight is impacting his health  Chronic diastolic CHF, Atrial fibrillation. - followed by Dr. Kerry Hough with cardiology  Time Spent Involved in Patient Care on Day of Examination:  36 minutes  Follow up:   Patient Instructions  Will get a copy of your Bipap report and have [employer] assess you for a portable oxygen concentrator.  Augmentin 1 pill twice per day for 7 days.  Follow up in 4 months.  Medication List:   Allergies as of 06/03/2022       Reactions   Celebrex [celecoxib]    Methotrexate Derivatives Other (See Comments)   Arrhythmia    Prednisone Other (See Comments)   Throwing up   Sulfa Antibiotics  Medication List        Accurate as of June 03, 2022 11:50 AM. If you have any questions, ask your nurse or doctor.          acetaminophen 500 MG tablet Commonly known as: TYLENOL Take 500 mg by mouth every 6 (six) hours as needed.   albuterol 108 (90 Base) MCG/ACT inhaler Commonly known as: VENTOLIN HFA Inhale 1 puff into the lungs every 6 (six) hours as needed.   albuterol (2.5 MG/3ML) 0.083% nebulizer solution Commonly known as: PROVENTIL INHALE 1 VIAL VIA NEBULIZER EVERY 6 HOURS AS NEEDED FOR WHEEZING OR SHORTNESS OF BREATH.   amoxicillin-clavulanate 875-125 MG tablet Commonly known as: AUGMENTIN Take 1 tablet by mouth 2 (two) times daily. Started by: Chesley Mires, MD   apixaban 5 MG  Tabs tablet Commonly known as: ELIQUIS Take 1 tablet (5 mg total) by mouth 2 (two) times daily.   atorvastatin 10 MG tablet Commonly known as: LIPITOR Take 1 tablet (10 mg total) by mouth daily.   ketoconazole 2 % cream Commonly known as: NIZORAL Apply 1 application topically 2 (two) times daily as needed for irritation.   metoprolol tartrate 100 MG tablet Commonly known as: LOPRESSOR Take 1 tablet (100 mg total) by mouth 2 (two) times daily.   torsemide 20 MG tablet Commonly known as: DEMADEX TAKE 3 TABLETS (60 MG TOTAL) BY MOUTH 2 (TWO) TIMES DAILY.   Trelegy Ellipta 100-62.5-25 MCG/ACT Aepb Generic drug: Fluticasone-Umeclidin-Vilant Inhale 1 puff into the lungs daily. What changed: Another medication with the same name was removed. Continue taking this medication, and follow the directions you see here. Changed by: Chesley Mires, MD   triamcinolone cream 0.1 % Commonly known as: KENALOG Apply 1 application topically 3 (three) times daily.   UNABLE TO FIND Med Name: bariatric commode E66.01   UNABLE TO FIND Med Name: bariatric wheelchair E66.01        Signature:  Chesley Mires, MD [place] Pager - [phone] 06/03/2022, 11:50 AM

## 2022-06-04 ENCOUNTER — Telehealth: Payer: Self-pay | Admitting: Pulmonary Disease

## 2022-06-04 DIAGNOSIS — J449 Chronic obstructive pulmonary disease, unspecified: Secondary | ICD-10-CM

## 2022-06-04 DIAGNOSIS — J9611 Chronic respiratory failure with hypoxia: Secondary | ICD-10-CM

## 2022-06-04 NOTE — Telephone Encounter (Signed)
Order fixed per [employer].  Called and notified patient.  Nothing further needed

## 2022-06-04 NOTE — Telephone Encounter (Signed)
Will send community message to [employer] and update patient and encounter once I hear back.

## 2022-06-04 NOTE — Telephone Encounter (Signed)
Pt. Calling back about portable oxygen concentrator [employer] is say that the order is wrong in the wording and need someone to call [employer] [facility] to help him  get portable oxygen concentrator.

## 2022-06-04 NOTE — Telephone Encounter (Signed)
Reinaldo Raddle D, CMA; Darlina Guys; [place], Mardene Celeste There is a thread below from raven I don't believe you're in that conversation. The order must state what i have below.  If new O2 start,, Please evaluate and titrate for best fit POC or portable O2 system. RT to evaluate and titrate patient for POC or Homefill with OCD maintain sats >/=90%. if patient qualifies, dispense POC or homefill with OCD 1-5 pulse dose.  thanks!

## 2022-06-05 NOTE — Telephone Encounter (Signed)
Updated order placed to [employer] 06/04/2022. Please see duplicate phone encounter dated as 06/04/2022

## 2022-07-01 ENCOUNTER — Ambulatory Visit: Payer: Self-pay | Admitting: *Deleted

## 2022-07-20 IMAGING — CT CT ABD-PELV W/ CM
2 of 5 series · 16 of 46 positions shown, 18 images · IV contrast (Omnipaque or Isovue)
Comparison: CT abdomen 04/22/2018

CLINICAL DATA: Acute abdominal pain.  Vomiting.

EXAM:
CT ABDOMEN AND PELVIS WITH CONTRAST
TECHNIQUE: Multidetector CT imaging of the abdomen and pelvis was performed
using the standard protocol following bolus administration of
intravenous contrast.
CONTRAST:  100mL OMNIPAQUE IOHEXOL 350 MG/ML SOLN

[Series 2: axial st · axial · 1.27mm/px · z∈[+1066,+1586]mm · 13 of 121 slices shown, 15 images]
[im 9/121  soft-tissue]
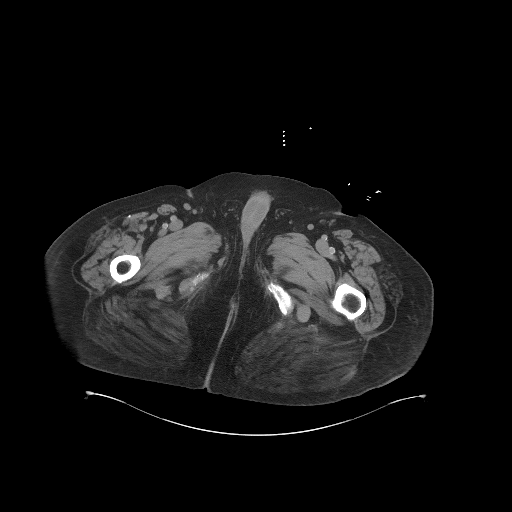
[im 9/121  bone]
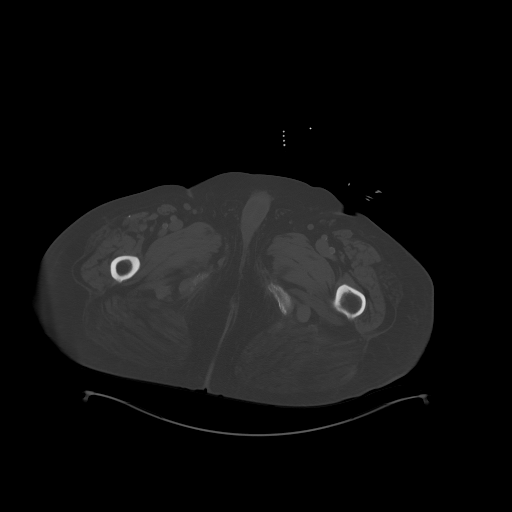
[im 17/121  soft-tissue]
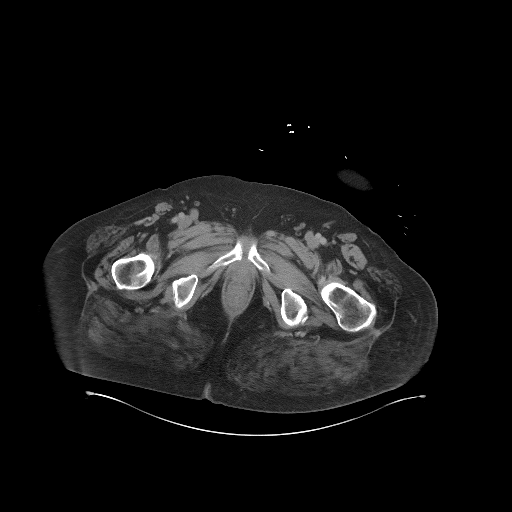
[im 25/121  soft-tissue]
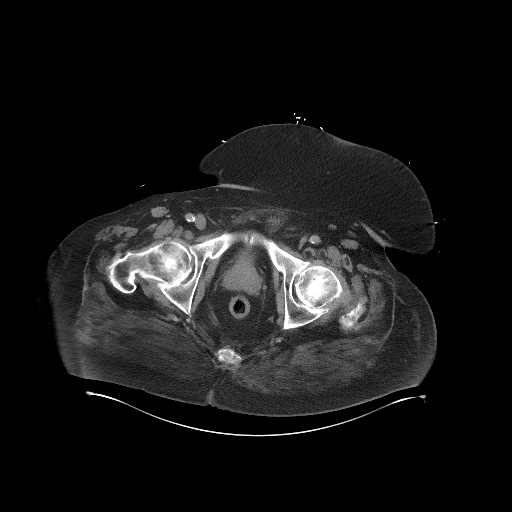
[im 33/121  soft-tissue]
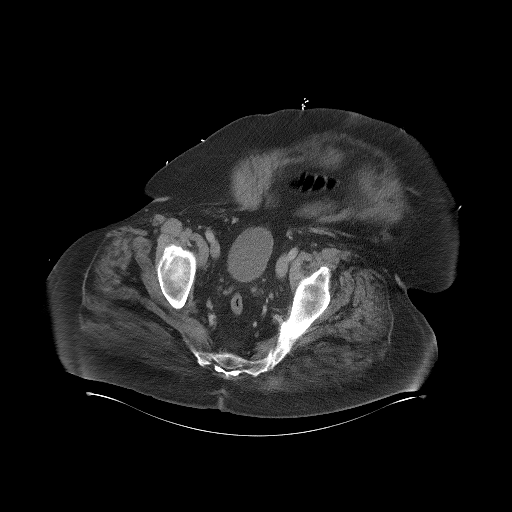
[im 41/121  soft-tissue]
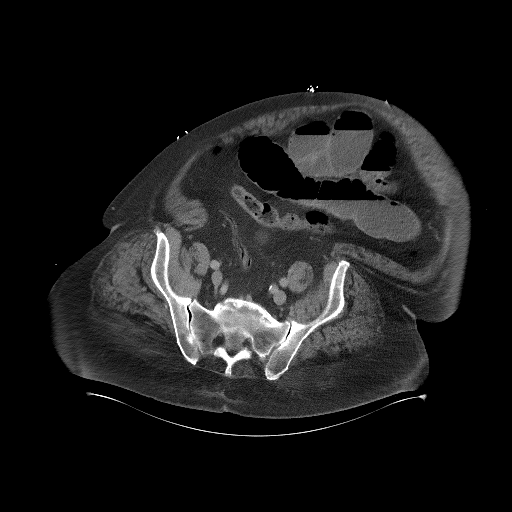
[im 49/121  soft-tissue]
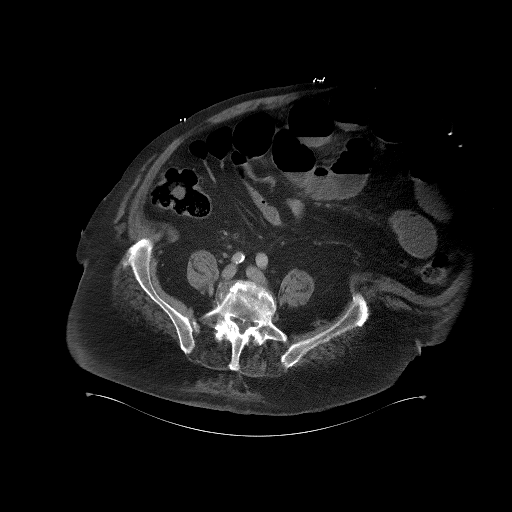
[im 65/121  soft-tissue]
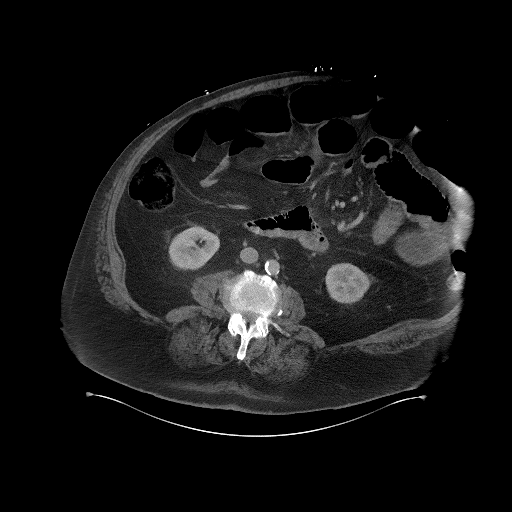
[im 73/121  soft-tissue]
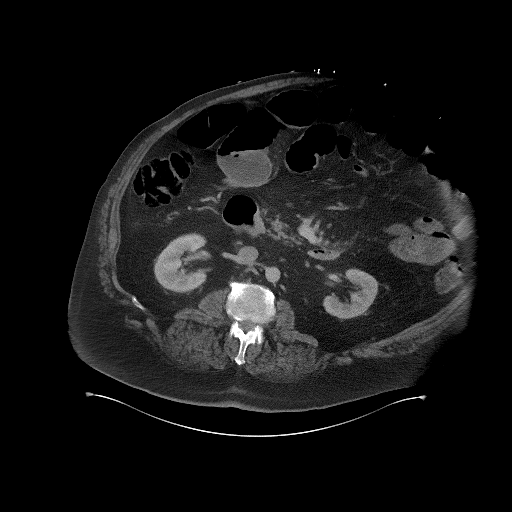
[im 81/121  soft-tissue]
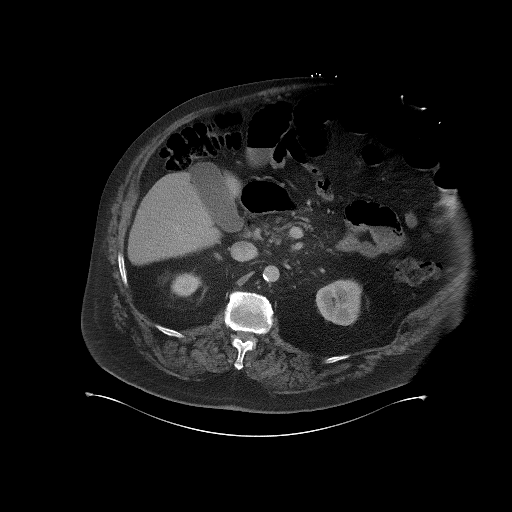
[im 81/121  bone]
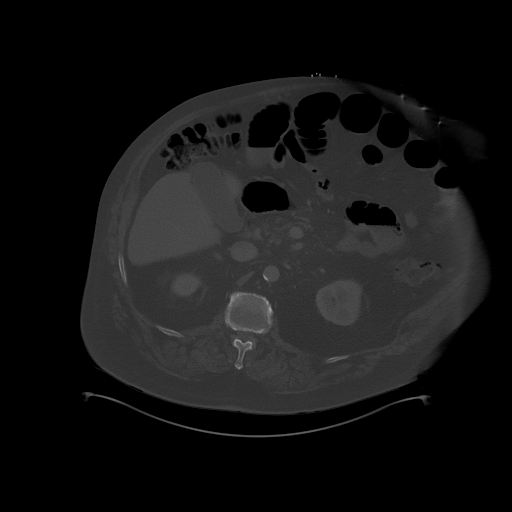
[im 89/121  soft-tissue]
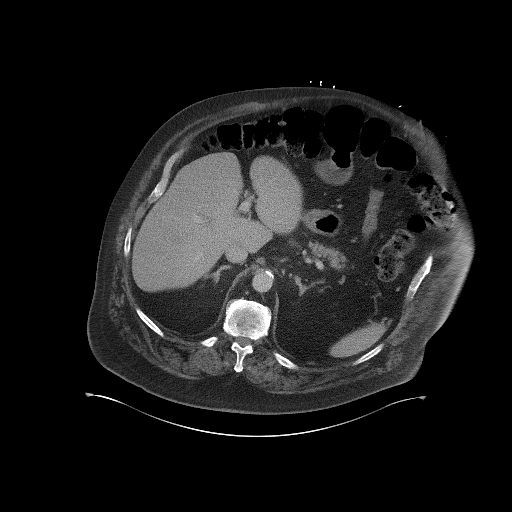
[im 97/121  soft-tissue]
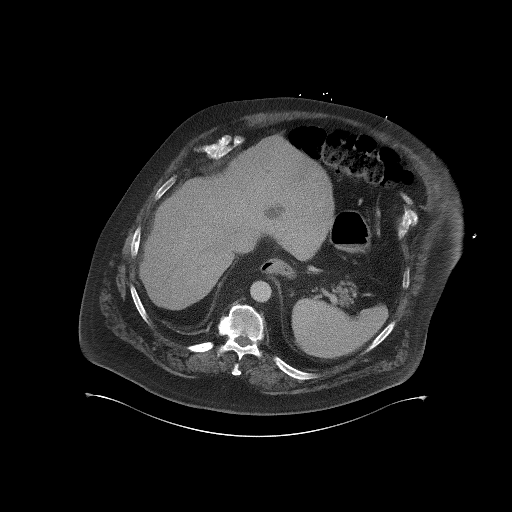
[im 105/121  soft-tissue]
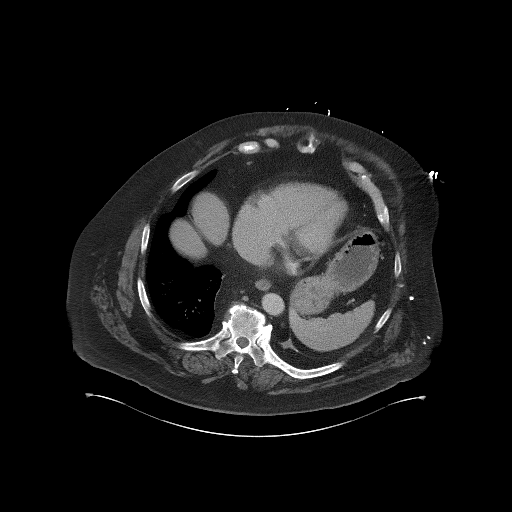
[im 113/121  soft-tissue]
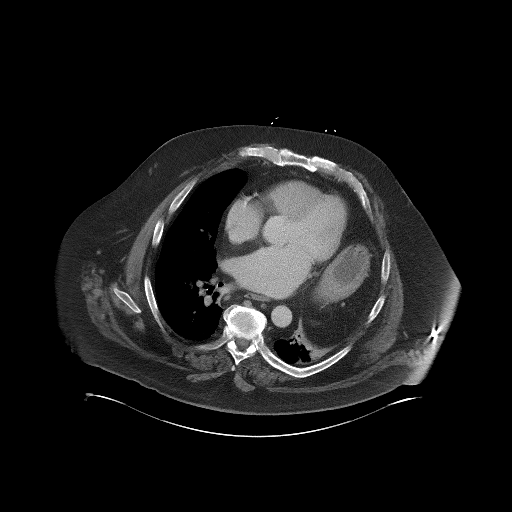

[Series 5: coronal st · coronal · 1.03mm/px · 3 of 154 slices shown]
[im 52/154  soft-tissue]
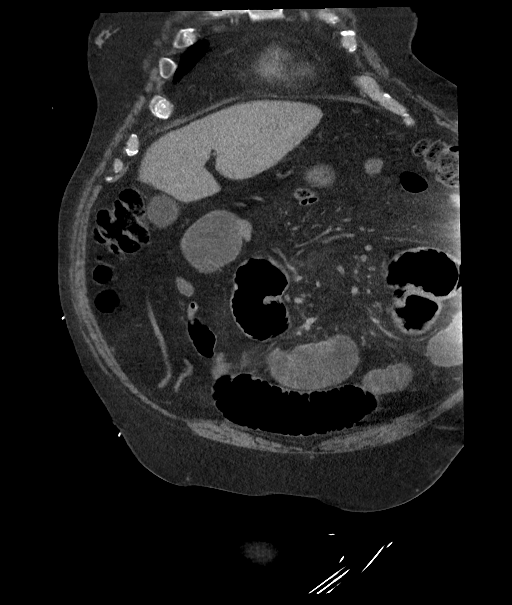
[im 69/154  soft-tissue]
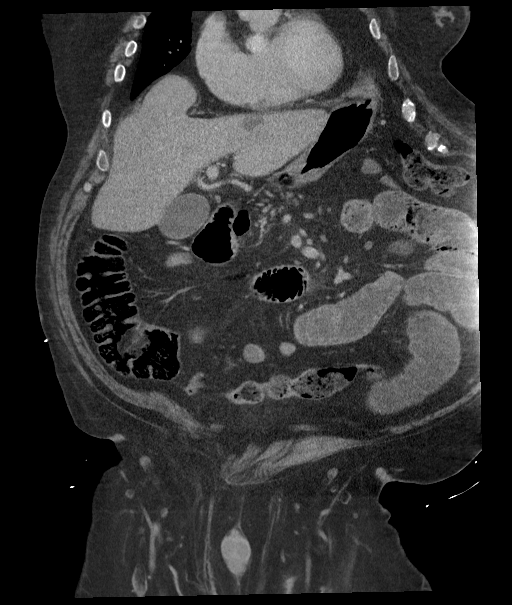
[im 86/154  soft-tissue]
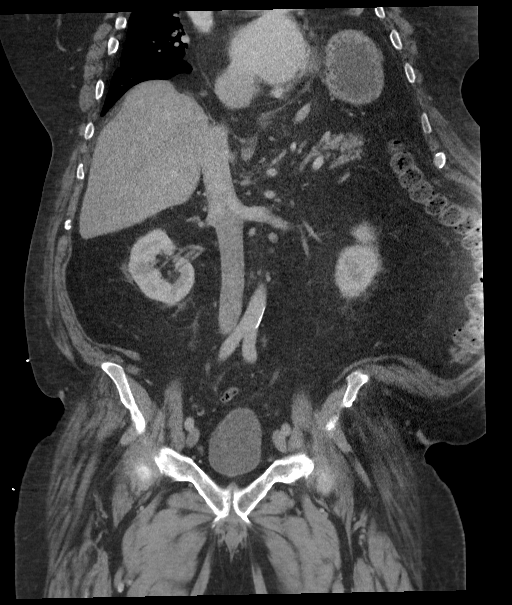

[16 of 46 positions shown; findings below may reference images not displayed]

FINDINGS: Lower chest: Atelectasis LEFT lung base. Elevated LEFT hemidiaphragm
diaphragm

Hepatobiliary: Simple fluid attenuation lesion in the LEFT hepatic
lobe not changed from prior. Probable collection of small benign
cysts. No biliary duct dilatation. Gallbladder normal. Suggestion of
sludge within the gallbladder.

Pancreas: Pancreas is normal. No ductal dilatation. No pancreatic
inflammation.

Spleen: Normal spleen

Adrenals/urinary tract: Adrenal glands and kidneys are normal. The
ureters and bladder normal.

Stomach/Bowel: Stomach is normal.  Duodenum is normal.

There is laxity of the abdominal wall such that the entirety of the
abdomen is not imaged.

There are dilated loops of small bowel within the mid jejunum and
ileum measuring up to 5.4 cm (coronal image 23/series 5) the small
bowel is collapsed leading up to the terminal ileum. Appendix
normal.

No transitional point is identified

No pneumatosis or portal venous gas.

Small volume stool in the colon. Gas throughout colon. Gas in the
rectum.

Vascular/Lymphatic: Abdominal aorta is normal caliber with
atherosclerotic calcification. There is no retroperitoneal or
periportal lymphadenopathy. No pelvic lymphadenopathy.

Reproductive: Prostate un remark

Other: No intraperitoneal free air.  No intraperitoneal free fluid

Musculoskeletal: No aggressive osseous lesion.
IMPRESSION: 1. Small bowel obstruction pattern with multiple dilated loops of
mid small bowel and collapsed distal ileum. No discrete transition
point identified.
2. No pneumatosis or portal venous gas identified.  No perforation
3. Stool and gas in the colon suggests early obstruction.

## 2022-07-31 ENCOUNTER — Other Ambulatory Visit: Payer: Self-pay | Admitting: Cardiology

## 2022-08-18 DIAGNOSIS — Z91199 Patient's noncompliance with other medical treatment and regimen due to unspecified reason: Secondary | ICD-10-CM | POA: Insufficient documentation

## 2022-08-31 ENCOUNTER — Other Ambulatory Visit: Payer: Self-pay | Admitting: Cardiology

## 2022-09-01 NOTE — Telephone Encounter (Signed)
Prescription refill request for Eliquis received. Indication: AF Last office visit: 01/28/22  Dominga Ferry MD Scr: 0.61 on 09/01/22  Epic Age: 72 Weight: 164.8kg  Based on above findings Eliquis 5mg  twice daily is the appropriate dose.  Refill approved.

## 2022-09-02 ENCOUNTER — Encounter (HOSPITAL_COMMUNITY): Payer: Self-pay

## 2022-09-03 ENCOUNTER — Encounter (HOSPITAL_COMMUNITY): Payer: Self-pay | Admitting: Internal Medicine

## 2022-09-03 ENCOUNTER — Inpatient Hospital Stay (HOSPITAL_COMMUNITY)
Admission: AD | Admit: 2022-09-03 | Discharge: 2022-09-10 | DRG: 389 | Disposition: A | Discharge status: Home Health | Payer: Medicare Other | Source: Other Acute Inpatient Hospital | Attending: Internal Medicine | Admitting: Internal Medicine

## 2022-09-03 ENCOUNTER — Inpatient Hospital Stay (HOSPITAL_COMMUNITY): Payer: Medicare Other

## 2022-09-03 ENCOUNTER — Other Ambulatory Visit: Payer: Self-pay

## 2022-09-03 DIAGNOSIS — I5032 Chronic diastolic (congestive) heart failure: Secondary | ICD-10-CM | POA: Diagnosis present

## 2022-09-03 DIAGNOSIS — I959 Hypotension, unspecified: Secondary | ICD-10-CM | POA: Diagnosis not present

## 2022-09-03 DIAGNOSIS — G4733 Obstructive sleep apnea (adult) (pediatric): Secondary | ICD-10-CM | POA: Diagnosis present

## 2022-09-03 DIAGNOSIS — Z7901 Long term (current) use of anticoagulants: Secondary | ICD-10-CM

## 2022-09-03 DIAGNOSIS — Z833 Family history of diabetes mellitus: Secondary | ICD-10-CM

## 2022-09-03 DIAGNOSIS — M19042 Primary osteoarthritis, left hand: Secondary | ICD-10-CM | POA: Diagnosis present

## 2022-09-03 DIAGNOSIS — J449 Chronic obstructive pulmonary disease, unspecified: Secondary | ICD-10-CM | POA: Diagnosis present

## 2022-09-03 DIAGNOSIS — M069 Rheumatoid arthritis, unspecified: Secondary | ICD-10-CM | POA: Diagnosis present

## 2022-09-03 DIAGNOSIS — Z8549 Personal history of malignant neoplasm of other male genital organs: Secondary | ICD-10-CM

## 2022-09-03 DIAGNOSIS — R079 Chest pain, unspecified: Secondary | ICD-10-CM | POA: Diagnosis present

## 2022-09-03 DIAGNOSIS — Z8701 Personal history of pneumonia (recurrent): Secondary | ICD-10-CM

## 2022-09-03 DIAGNOSIS — I483 Typical atrial flutter: Secondary | ICD-10-CM | POA: Diagnosis present

## 2022-09-03 DIAGNOSIS — Z7951 Long term (current) use of inhaled steroids: Secondary | ICD-10-CM | POA: Diagnosis not present

## 2022-09-03 DIAGNOSIS — Z8249 Family history of ischemic heart disease and other diseases of the circulatory system: Secondary | ICD-10-CM

## 2022-09-03 DIAGNOSIS — Z888 Allergy status to other drugs, medicaments and biological substances status: Secondary | ICD-10-CM

## 2022-09-03 DIAGNOSIS — I480 Paroxysmal atrial fibrillation: Secondary | ICD-10-CM | POA: Diagnosis not present

## 2022-09-03 DIAGNOSIS — K56609 Unspecified intestinal obstruction, unspecified as to partial versus complete obstruction: Secondary | ICD-10-CM

## 2022-09-03 DIAGNOSIS — M19041 Primary osteoarthritis, right hand: Secondary | ICD-10-CM | POA: Diagnosis present

## 2022-09-03 DIAGNOSIS — Z6839 Body mass index (BMI) 39.0-39.9, adult: Secondary | ICD-10-CM | POA: Diagnosis not present

## 2022-09-03 DIAGNOSIS — Z87891 Personal history of nicotine dependence: Secondary | ICD-10-CM

## 2022-09-03 DIAGNOSIS — D649 Anemia, unspecified: Secondary | ICD-10-CM | POA: Diagnosis present

## 2022-09-03 DIAGNOSIS — I4819 Other persistent atrial fibrillation: Secondary | ICD-10-CM | POA: Diagnosis present

## 2022-09-03 DIAGNOSIS — E782 Mixed hyperlipidemia: Secondary | ICD-10-CM | POA: Diagnosis present

## 2022-09-03 DIAGNOSIS — Z79899 Other long term (current) drug therapy: Secondary | ICD-10-CM

## 2022-09-03 DIAGNOSIS — J9611 Chronic respiratory failure with hypoxia: Secondary | ICD-10-CM | POA: Diagnosis present

## 2022-09-03 DIAGNOSIS — D696 Thrombocytopenia, unspecified: Secondary | ICD-10-CM | POA: Diagnosis present

## 2022-09-03 DIAGNOSIS — I110 Hypertensive heart disease with heart failure: Secondary | ICD-10-CM | POA: Diagnosis present

## 2022-09-03 DIAGNOSIS — Z881 Allergy status to other antibiotic agents status: Secondary | ICD-10-CM

## 2022-09-03 DIAGNOSIS — J9612 Chronic respiratory failure with hypercapnia: Secondary | ICD-10-CM | POA: Diagnosis present

## 2022-09-03 DIAGNOSIS — K56600 Partial intestinal obstruction, unspecified as to cause: Principal | ICD-10-CM | POA: Diagnosis present

## 2022-09-03 DIAGNOSIS — Z9981 Dependence on supplemental oxygen: Secondary | ICD-10-CM | POA: Diagnosis not present

## 2022-09-03 DIAGNOSIS — J42 Unspecified chronic bronchitis: Secondary | ICD-10-CM | POA: Diagnosis not present

## 2022-09-03 DIAGNOSIS — Z886 Allergy status to analgesic agent status: Secondary | ICD-10-CM

## 2022-09-03 DIAGNOSIS — Z882 Allergy status to sulfonamides status: Secondary | ICD-10-CM

## 2022-09-03 DIAGNOSIS — Z8042 Family history of malignant neoplasm of prostate: Secondary | ICD-10-CM

## 2022-09-03 DIAGNOSIS — Z820 Family history of epilepsy and other diseases of the nervous system: Secondary | ICD-10-CM

## 2022-09-03 LAB — CBC
HCT: 39.8 % (ref 39.0–52.0)
Hemoglobin: 12.9 g/dL — ABNORMAL LOW (ref 13.0–17.0)
MCH: 28.9 pg (ref 26.0–34.0)
MCHC: 32.4 g/dL (ref 30.0–36.0)
MCV: 89 fL (ref 80.0–100.0)
Platelets: 126 10*3/uL — ABNORMAL LOW (ref 150–400)
RBC: 4.47 MIL/uL (ref 4.22–5.81)
RDW: 13.7 % (ref 11.5–15.5)
WBC: 14 10*3/uL — ABNORMAL HIGH (ref 4.0–10.5)
nRBC: 0 % (ref 0.0–0.2)

## 2022-09-03 LAB — POCT I-STAT 7, (LYTES, BLD GAS, ICA,H+H)
Acid-Base Excess: 12 mmol/L — ABNORMAL HIGH (ref 0.0–2.0)
Bicarbonate: 39.2 mmol/L — ABNORMAL HIGH (ref 20.0–28.0)
Calcium, Ion: 1.23 mmol/L (ref 1.15–1.40)
HCT: 40 % (ref 39.0–52.0)
Hemoglobin: 13.6 g/dL (ref 13.0–17.0)
O2 Saturation: 96 %
Patient temperature: 97.7
Potassium: 3.7 mmol/L (ref 3.5–5.1)
Sodium: 135 mmol/L (ref 135–145)
TCO2: 41 mmol/L — ABNORMAL HIGH (ref 22–32)
pCO2 arterial: 57.2 mmHg — ABNORMAL HIGH (ref 32–48)
pH, Arterial: 7.442 (ref 7.35–7.45)
pO2, Arterial: 82 mmHg — ABNORMAL LOW (ref 83–108)

## 2022-09-03 LAB — COMPREHENSIVE METABOLIC PANEL
ALT: 94 U/L — ABNORMAL HIGH (ref 0–44)
AST: 59 U/L — ABNORMAL HIGH (ref 15–41)
Albumin: 2.8 g/dL — ABNORMAL LOW (ref 3.5–5.0)
Alkaline Phosphatase: 65 U/L (ref 38–126)
Anion gap: 7 (ref 5–15)
BUN: 16 mg/dL (ref 8–23)
CO2: 36 mmol/L — ABNORMAL HIGH (ref 22–32)
Calcium: 8.6 mg/dL — ABNORMAL LOW (ref 8.9–10.3)
Chloride: 90 mmol/L — ABNORMAL LOW (ref 98–111)
Creatinine, Ser: 0.54 mg/dL — ABNORMAL LOW (ref 0.61–1.24)
GFR, Estimated: >60 mL/min (ref >60)
Glucose, Bld: 116 mg/dL — ABNORMAL HIGH (ref 70–99)
Potassium: 3.6 mmol/L (ref 3.5–5.1)
Sodium: 133 mmol/L — ABNORMAL LOW (ref 135–145)
Total Bilirubin: 2.1 mg/dL — ABNORMAL HIGH (ref 0.3–1.2)
Total Protein: 5.9 g/dL — ABNORMAL LOW (ref 6.5–8.1)

## 2022-09-03 LAB — PHOSPHORUS: Phosphorus: 2.7 mg/dL (ref 2.5–4.6)

## 2022-09-03 LAB — GLUCOSE, CAPILLARY: Glucose-Capillary: 119 mg/dL — ABNORMAL HIGH (ref 70–99)

## 2022-09-03 LAB — TROPONIN I (HIGH SENSITIVITY)
Troponin I (High Sensitivity): 15 ng/L (ref <18)
Troponin I (High Sensitivity): 15 ng/L (ref <18)

## 2022-09-03 LAB — BRAIN NATRIURETIC PEPTIDE: B Natriuretic Peptide: 59.1 pg/mL (ref 0.0–100.0)

## 2022-09-03 LAB — MAGNESIUM: Magnesium: 2.1 mg/dL (ref 1.7–2.4)

## 2022-09-03 MED ORDER — POTASSIUM CHLORIDE 2 MEQ/ML IV SOLN
INTRAVENOUS | Status: AC
  Filled 2022-09-03 (×2): qty 1000

## 2022-09-03 MED ORDER — DIATRIZOATE MEGLUMINE & SODIUM 66-10 % PO SOLN
90.0000 mL | Freq: Once | ORAL | Status: AC
  Administered 2022-09-03: 90 mL via NASOGASTRIC
  Filled 2022-09-03: qty 90

## 2022-09-03 MED ORDER — ENOXAPARIN SODIUM 40 MG/0.4ML IJ SOSY
40.0000 mg | PREFILLED_SYRINGE | Freq: Every day | INTRAMUSCULAR | Status: DC
  Administered 2022-09-03 – 2022-09-05 (×3): 40 mg via SUBCUTANEOUS
  Filled 2022-09-03 (×4): qty 0.4

## 2022-09-03 MED ORDER — NICOTINE 14 MG/24HR TD PT24
14.0000 mg | MEDICATED_PATCH | Freq: Every day | TRANSDERMAL | Status: DC
  Administered 2022-09-03 – 2022-09-10 (×7): 14 mg via TRANSDERMAL
  Filled 2022-09-03 (×8): qty 1

## 2022-09-03 MED ORDER — CHLORHEXIDINE GLUCONATE CLOTH 2 % EX PADS
6.0000 | MEDICATED_PAD | Freq: Every day | CUTANEOUS | Status: DC
  Administered 2022-09-03 – 2022-09-05 (×3): 6 via TOPICAL

## 2022-09-03 NOTE — Hospital Course (Addendum)
Jesus Rodriguez is a 72 y.o. male with a history of morbid obesity, COPD, chronic respiratory failure with hypoxia and hypercapnia, hypertension, diastolic heart failure. Patient was transferred from [facility] for surgical intervention of ongoing small bowel obstruction. Patient was initially hospitalized for respiratory failure requiring intubation and mechanical ventilation; successfully extubated on 5/13. Patient was treated for acute heart failure and pneumonia with diuresis and antibiotics respectively. Patient with prolonged NG tube requirement and started on TPN. General surgery evaluated and SBO protocol revealed no obstruction. NG tube removed on 5/23.  Diet advanced.

## 2022-09-03 NOTE — Progress Notes (Signed)
RN received a call from Telemetry concerning pt's oxygenation dropping and sustaining the 70's. Pt found to be 78% on 2L oxygen [place]. Pt's oxygen was increased to 4L; pt assessed to have some perspiration on his forehead and he stated " I feel like I am about to have a heart attack" pt reported a dull, chest pain of 8 out of 10. VSS, MD notified and at bedside. EKG along with other orders received. Will continue to closely monitor pt. Dionne Bucy MSN RN.    09/03/22 0905  Vitals  BP 120/64  MAP (mmHg) 79  BP Location Left Arm  BP Method Automatic  Patient Position (if appropriate) Lying  Pulse Rate 92  Pulse Rate Source Monitor  ECG Heart Rate 98  Resp (!) 25  Level of Consciousness  Level of Consciousness Alert  MEWS COLOR  MEWS Score Color Green  Oxygen Therapy  SpO2 92 %  O2 Device Nasal Cannula  O2 Flow Rate (L/min) 4 L/min  MEWS Score  MEWS Temp 0  MEWS Systolic 0  MEWS Pulse 0  MEWS RR 1  MEWS LOC 0  MEWS Score 1

## 2022-09-03 NOTE — H&P (Addendum)
History and Physical  Jesus Rodriguez NFA:[id number] DOB: 05-12-1950 DOA: 09/03/2022  Referring physician: Accepted by Dr. Ashley Royalty, [facility], Hospitalist service.  PCP: Gilmore Laroche, FNP  Outpatient Specialists: [facility] Patient coming from: Home through [facility]  Chief Complaint: SBO   HPI: Jesus Rodriguez is a 72 y.o. male with medical history significant for morbid obesity, OSA on CPAP, chronic hypoxia on 4 L nasal cannula at baseline, history of scrotal cancer, chronic atrial fibrillation, chronic dCHF, essential hypertension, hyperlipidemia, chronic anxiety/depression, history of rheumatoid arthritis, who initially presented to [facility] with shortness of breath.  Was admitted for acute on chronic hypoxic and hypercarbic respiratory failure, acute CHF exacerbation.  His hospital course was complicated by SBO.  Transferred to [facility] for surgical evaluation.  At the time of this visit the patient is alert and confused.  Mittens placed for patient's own safety.  NG tube to suction.   ED Course: [facility] ED.  Review of Systems: Review of systems as noted in the HPI. All other systems reviewed and are negative.   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 His smoking use included cigarettes. He started smoking about 51 years ago. He has a 70.00 pack-year smoking history. He has never been exposed to tobacco smoke. He has never used smokeless tobacco. He reports that he does not drink alcohol and does not use drugs.   Allergies  Allergen Reactions   Celebrex [Celecoxib]    Methotrexate Derivatives Other (See Comments)    Arrhythmia    Prednisone Other (See Comments)    Throwing up   Sulfa Antibiotics     Family History  Problem Relation Age of Onset   Hypertension Father    Heart attack Father    Prostate cancer Father    Hypertension Sister    Hypertension Sister    Hypertension Sister    Epilepsy Son    Diabetes Son       Prior to Admission medications   Medication Sig Start Date End Date Taking? Authorizing Provider  acetaminophen (TYLENOL) 500 MG tablet Take 500 mg by mouth every 6 (six) hours as needed.    [provider]  albuterol (PROVENTIL) (2.5 MG/3ML) 0.083% nebulizer solution INHALE 1 VIAL VIA NEBULIZER EVERY 6 HOURS AS NEEDED FOR WHEEZING OR SHORTNESS OF BREATH. 02/03/22   Coralyn Helling, MD  albuterol (VENTOLIN HFA) 108 (90 Base) MCG/ACT inhaler Inhale 1 puff into the lungs every 6 (six) hours as needed. 10/21/21   Coralyn Helling, MD  amoxicillin-clavulanate (AUGMENTIN) 875-125 MG tablet Take 1 tablet by mouth 2 (two) times daily. 06/03/22   Coralyn Helling, MD  apixaban (ELIQUIS) 5 MG TABS tablet TAKE 1 TABLET BY MOUTH TWICE A DAY 09/01/22   Antoine Poche, MD  atorvastatin (LIPITOR) 10 MG tablet TAKE 1 TABLET BY MOUTH EVERY DAY 07/31/22   Antoine Poche, MD  Fluticasone-Umeclidin-Vilant (TRELEGY ELLIPTA) 100-62.5-25 MCG/ACT AEPB Inhale 1 puff into the lungs daily. 06/03/22   Coralyn Helling, MD  ketoconazole (NIZORAL) 2 % cream Apply 1 application topically 2 (two) times daily as needed for irritation. 03/27/20   [provider]  metoprolol tartrate (LOPRESSOR) 100 MG tablet Take 1 tablet (100 mg total) by mouth 2 (two) times daily. 10/23/21 10/18/22  Strader, Lennart Pall, PA-C  torsemide (DEMADEX) 20 MG tablet TAKE 3 TABLETS (60 MG TOTAL) BY MOUTH 2 (TWO) TIMES DAILY. 05/05/22   Antoine Poche, MD  triamcinolone (KENALOG) 0.1 % Apply 1 application topically 3 (three) times daily. 02/25/20   [provider]  UNABLE TO FIND Med Name: bariatric commode E66.01 09/27/21   Gilmore Laroche, FNP  UNABLE TO FIND Med Name: bariatric wheelchair E66.01 09/27/21   Gilmore Laroche, FNP    Physical Exam: BP (!) 105/55 (BP Location: Left Arm)   Pulse 80   Temp 97.7 F (36.5 C) (Oral)   Resp 20   Wt 133.8 kg   SpO2 95%   BMI 37.87 kg/m   General: 72 y.o. year-old male well developed well nourished in no acute distress.  Alert and confused. Cardiovascular: Regular rate and rhythm with no rubs or gallops.  No thyromegaly or JVD noted.  Chronic venous stasis. Respiratory: Clear to auscultation with no wheezes or rales. Good inspiratory effort. Abdomen: Soft nontender nondistended with normal bowel sounds x4 quadrants. Muskuloskeletal: Chronic venous stasis in lower extremities Neuro: CN II-XII intact, strength, sensation, reflexes Skin: No ulcerative lesions noted or rashes Psychiatry: Judgement and insight appear altered. Mood is appropriate for condition and setting          Labs on Admission:  Basic Metabolic Panel: No results for input(s): "NA", "K", "CL", "CO2", "GLUCOSE", "BUN", "CREATININE", "CALCIUM", "MG", "PHOS" in the last 168 hours. Liver Function Tests: No results for input(s): "AST", "ALT", "ALKPHOS", "BILITOT", "PROT", "ALBUMIN" in the last 168 hours. No results for input(s): "LIPASE", "AMYLASE" in the last 168 hours. No results for input(s): "AMMONIA" in the last 168 hours. CBC: No results for input(s): "WBC", "NEUTROABS", "HGB", "HCT", "MCV", "PLT" in the last 168 hours. Cardiac  Enzymes: No results for input(s): "CKTOTAL", "CKMB", "CKMBINDEX", "TROPONINI" in the last 168 hours.  BNP (last 3 results) Recent Labs    09/17/21 0858 10/03/21 1521  BNP 161.0* 113.0*    ProBNP (last 3 results) No results for input(s): "PROBNP" in the last 8760 hours.  CBG: No results for input(s): "GLUCAP" in the last 168 hours.  Radiological Exams on Admission: No results found.  EKG: I independently viewed the EKG done and my findings are as followed: None available.   Assessment/Plan Present on Admission:  SBO (small bowel obstruction) (HCC)  Principal Problem:   SBO (small bowel obstruction) (HCC)  SBO, present on admission NG tube in place PRN analgesics IV fluid Optimize magnesium and potassium levels Goal magnesium greater than 2.0, goal potassium greater than 4.0 Mobilize as tolerated Repeat imaging Transferred for surgery's evaluation  Acute hypoxic and hypercapnic respiratory failure Resume home regimen On 4 L nasal cannula at baseline O2 saturation goal between 90-92%  OSA on CPAP Resume CPAP nightly  Obesity BMI 37 Recommend weight loss outpatient with regular physical activity  and healthy dieting.     DVT prophylaxis: Subcu Lovenox daily  Code Status: Full code   Family Communication: none at bedside   Disposition Plan: Admitted to progressive care unit   Consults called: general surgery prior to transfer   Admission status: Inpatient status    Status is: Inpatient    Darlin Drop MD [facility] Pager [phone]  If 7PM-7AM, please contact night-coverage [email or web address] Password [facility]  09/03/2022, 2:24 AM

## 2022-09-03 NOTE — Progress Notes (Signed)
PROGRESS NOTE    Jesus Rodriguez  ZOX:[id number] DOB: [birth year] DOA: 09/03/2022 PCP: Gilmore Laroche, FNP   Brief Narrative: Jesus Rodriguez is a 72 y.o. male with a history of morbid obesity, COPD, chronic respiratory failure with hypoxia and hypercapnia, hypertension, diastolic heart failure. Patient was transferred from [facility] for surgical intervention of ongoing small bowel obstruction. Patient was initially hospitalized for respiratory failure requiring intubation and mechanical ventilation; successfully extubated on 5/13. Patient was treated for acute heart failure and pneumonia with diuresis and antibiotics respectively. Patient with prolonged NG tube requirement and started on TPN.   Assessment and Plan:  Small bowel obstruction Present on admission. Per chart review, it appears patient has been managed on NG suction and TPN for nutrition at outside hospital. Per outside records, patient was having bowel movements, but was still transferred to [facility] for surgical intervention. Abdomen is soft. -Continue NG tube -General surgery recommendations pending  Chronic respiratory failure with hypoxia and hypercapnia Patient uses 4 L/min of oxygen at baseline. He is required to use CPAP, but from chart review, is not completely adherent with regimen. Possible acute respiratory failure at this time with patient presentation. -Continue oxygen at 4 L/min -ABG  Atrial fibrillation Typical atrial flutter Patient is s/p CTI ablation in 2017. Patient is followed by EP and has failed sotalol and dofetilide (secondary to prolonged QTc). He is currently managed on metoprolol tartrate 100 mg BID which is held. -Hold metoprolol while NPO  Chest pain Does not appear to be typical. Possibly related to hypoxia, as patient was found to have oxygen saturations in the 70s during his episode. EKG without specific ST-T segment changes. Chest pain improving after oxygen. -Chest  x-ray -Troponin  Chronic diastolic heart failure Patient is euvolemic. Last LVEF of 55-60%. Patient is managed on torsemide as an outpatient. -Hold torsemide for now  Hyperlipidemia -Continue home Lipitor once able to take PO consistently  Primary hypertension Normotensive at this time.  OSA Per outside records, patient was managed with NIV as an outpatient but has been non-adherent. During hospitalization, he has been managed with BiPAP qHS. -Continue BiPAP qHS  Obesity Estimated body mass index is 37.87 kg/m as calculated from the following:   Height as of this encounter: 6\' 2"  (1.88 m).   Weight as of this encounter: 133.8 kg.    DVT prophylaxis: SCDs Code Status:   Code Status: Full Code Family Communication: None at bedside Disposition Plan: Discharge pending ongoing general surgery recommendations   Consultants:  General surgery  Procedures:  None  Antimicrobials: None    Subjective: Patient reports some chest pain, although points to his left upper abdomen with radiation centrally to epigastric area. Chest pain has improved. No other symptoms.  Objective: BP (!) 105/55   Pulse 80   Temp (!) 97 F (36.1 C) (Oral)   Resp 20   Ht 6\' 2"  (1.88 m)   Wt 133.8 kg   SpO2 95%   BMI 37.87 kg/m   Examination:  General exam: Appears calm and comfortable  Respiratory system: Clear to auscultation. Respiratory effort normal. Cardiovascular system: S1 & S2 heard, RRR. Gastrointestinal system: Abdomen is nondistended, soft and nontender. No organomegaly or masses felt. Bowel sounds heard. Central nervous system: Hypersomnolence but arouses with verbal questions. Oriented. No focal neurological deficits. Musculoskeletal: No edema. No calf tenderness Skin: No cyanosis. No rashes Psychiatry: Judgement and insight appear normal. Mood & affect appropriate.    Data Reviewed: I have personally reviewed following  labs and imaging studies  CBC Lab Results   Component Value Date   WBC 14.0 (H) 09/03/2022   RBC 4.47 09/03/2022   HGB 12.9 (L) 09/03/2022   HCT 39.8 09/03/2022   MCV 89.0 09/03/2022   MCH 28.9 09/03/2022   PLT 126 (L) 09/03/2022   MCHC 32.4 09/03/2022   RDW 13.7 09/03/2022   LYMPHSABS 0.9 10/03/2021   MONOABS 0.6 10/03/2021   EOSABS 0.3 10/03/2021   BASOSABS 0.1 10/03/2021     Last metabolic panel Lab Results  Component Value Date   NA 133 (L) 09/03/2022   K 3.6 09/03/2022   CL 90 (L) 09/03/2022   CO2 36 (H) 09/03/2022   BUN 16 09/03/2022   CREATININE 0.54 (L) 09/03/2022   GLUCOSE 116 (H) 09/03/2022   GFRNONAA >60 09/03/2022   GFRAA >60 11/30/2019   CALCIUM 8.6 (L) 09/03/2022   PHOS 2.7 09/03/2022   PROT 5.9 (L) 09/03/2022   ALBUMIN 2.8 (L) 09/03/2022   BILITOT 2.1 (H) 09/03/2022   ALKPHOS 65 09/03/2022   AST 59 (H) 09/03/2022   ALT 94 (H) 09/03/2022   ANIONGAP 7 09/03/2022    GFR: Estimated Creatinine Clearance: 121.4 mL/min (A) (by C-G formula based on SCr of 0.54 mg/dL (L)).  No results found for this or any previous visit (from the past 240 hour(s)).    Radiology Studies: DG Chest Port 1 View  Result Date: 09/03/2022 CLINICAL DATA:  Small-bowel obstruction. EXAM: PORTABLE CHEST 1 VIEW COMPARISON:  Earlier radiograph dated 09/02/2022. FINDINGS: Enteric tube with tip just distal to the GE junction. The side port of the tube appears in the distal esophagus. Recommend further advancing by additional 9 cm. There is air distention of small-bowel loops. Air is noted throughout the colon. Small bilateral pleural effusions and bibasilar consolidative changes. No pneumothorax. Stable cardiac silhouette. No acute osseous pathology. IMPRESSION: 1. Enteric tube with tip in the proximal stomach. Recommend further advancing by additional 9 cm. 2. Small bilateral pleural effusions and bibasilar consolidative changes. Electronically Signed   By: Elgie Collard M.D.   On: 09/03/2022 03:07   DG Abd Portable  1V  Result Date: 09/03/2022 CLINICAL DATA:  Small-bowel obstruction. EXAM: PORTABLE CHEST 1 VIEW COMPARISON:  Earlier radiograph dated 09/02/2022. FINDINGS: Enteric tube with tip just distal to the GE junction. The side port of the tube appears in the distal esophagus. Recommend further advancing by additional 9 cm. There is air distention of small-bowel loops. Air is noted throughout the colon. Small bilateral pleural effusions and bibasilar consolidative changes. No pneumothorax. Stable cardiac silhouette. No acute osseous pathology. IMPRESSION: 1. Enteric tube with tip in the proximal stomach. Recommend further advancing by additional 9 cm. 2. Small bilateral pleural effusions and bibasilar consolidative changes. Electronically Signed   By: Elgie Collard M.D.   On: 09/03/2022 03:07      LOS: 0 days    Jacquelin Hawking, MD [facility] 09/03/2022, 7:50 AM   If 7PM-7AM, please contact night-coverage [email or web address]

## 2022-09-03 NOTE — Consult Note (Signed)
Consult Note  Jesus Rodriguez 1950/07/20  [id number].    Requesting MD: Jacquelin Hawking, MD Chief Complaint/Reason for Consult: SBO  HPI:  Patient is a 72 year old male transferred from [facility] with concern for SBO. Reportedly seen by surgeon there who recommended exploration but unable to undergo general anesthetic there due to risk. Patient was initially hospitalized for acute on chronic respiratory failure requiring intubation. Extubated on 5/13 and back to baseline oxygen requirement of 4L. Acute respiratory failure thought to be secondary to CHF exacerbation and pneumonia. He underwent diuresis and treatment with antibiotics. He developed some abdominal distention and had NGT placed and has been on TPN. Per report patient is having bowel function and has not been having severe abdominal pain. PMH otherwise significant for morbid obesity, A. Fib/Flutter, HTN, HLD, rheumatoid arthritis, and scrotal cancer. He has not had any prior abdominal surgery per patient and chart. Takes eliquis at home but not on it currently.   ROS: Negative other than HPI  Family History  Problem Relation Age of Onset   Hypertension Father    Heart attack Father    Prostate cancer Father    Hypertension Sister    Hypertension Sister    Hypertension Sister    Epilepsy Son    Diabetes Son     Past Medical History:  Diagnosis Date   Anxiety 02/13/2016   Atrial flutter (HCC) 02/13/2016   s/p CTI ablation at [facility]   Chronic a-fib [facility])    DDD (degenerative disc disease), lumbar 02/13/2016   Essential hypertension 02/13/2016   Insomnia 02/13/2016   Mixed hyperlipidemia 02/13/2016   Osteoarthritis of both hands    Persistent atrial fibrillation (HCC)    RA (rheumatoid arthritis) (HCC) 02/13/2016   Sero Negative   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 His smoking use included cigarettes. He started smoking about 51 years ago. He has a 70.00 pack-year smoking history. He has never been exposed to tobacco smoke. He has never used smokeless tobacco. He reports that he does not drink alcohol and does not use drugs.  Allergies:  Allergies  Allergen Reactions   Celebrex [Celecoxib]    Methotrexate Derivatives Other (See Comments)    Arrhythmia    Prednisone Other (See Comments)    Throwing up   Sulfa Antibiotics     Medications Prior to Admission  Medication Sig Dispense Refill   acetaminophen (TYLENOL) 500 MG tablet Take 500 mg by mouth every 6 (six) hours as needed.     albuterol (PROVENTIL) (2.5 MG/3ML) 0.083% nebulizer solution INHALE 1 VIAL VIA NEBULIZER EVERY 6 HOURS AS NEEDED FOR WHEEZING OR SHORTNESS OF BREATH. 360 mL 0   albuterol (VENTOLIN HFA) 108 (90 Base) MCG/ACT inhaler Inhale 1 puff into the lungs every 6 (six) hours as needed. 8 g 5   amoxicillin-clavulanate (AUGMENTIN) 875-125 MG tablet Take 1 tablet by mouth 2 (two) times daily. 14 tablet 0   apixaban (ELIQUIS) 5 MG TABS tablet TAKE 1 TABLET BY MOUTH TWICE A DAY 60 tablet 5   atorvastatin (  LIPITOR) 10 MG tablet TAKE 1 TABLET BY MOUTH EVERY DAY 90 tablet 0   Fluticasone-Umeclidin-Vilant (TRELEGY ELLIPTA) 100-62.5-25 MCG/ACT AEPB Inhale 1 puff into the lungs daily. 90 each 3   ketoconazole (NIZORAL) 2 % cream Apply 1 application topically 2 (two) times daily as needed for irritation.     metoprolol tartrate (LOPRESSOR) 100 MG tablet Take 1 tablet (100 mg total) by mouth 2 (two) times daily. 180 tablet 3   torsemide (DEMADEX) 20 MG tablet TAKE 3 TABLETS (60 MG TOTAL) BY MOUTH 2 (TWO) TIMES DAILY. 540 tablet 2    triamcinolone (KENALOG) 0.1 % Apply 1 application topically 3 (three) times daily.     UNABLE TO FIND Med Name: bariatric commode E66.01 1 each 0   UNABLE TO FIND Med Name: bariatric wheelchair E66.01 1 each 0    Blood pressure 113/69, pulse 81, temperature 97.7 F (36.5 C), temperature source Oral, resp. rate (!) 22, height 6\' 2"  (1.88 m), weight 133.8 kg, SpO2 96 %. Physical Exam:  General: WD, obese male/male who is laying in bed in NAD HEENT: Sclera are noninjected.  EOMI.  Wound to bridge of nose.  Mouth is pink and moist Heart: regular, rate, and rhythm.  Normal s1,s2. No obvious murmurs, gallops, or rubs noted.  Palpable radial and pedal pulses bilaterally Lungs: Wheezing bilaterally.  Respiratory effort nonlabored on 4L via [place] Abd: soft, NT, ND, +BS, NGT with thin fluid, no surgical scars noted MS: all 4 extremities are symmetrical with no cyanosis, clubbing, or edema. Skin: warm and dry with no masses, lesions, or rashes Neuro: non focal exam    Results for orders placed or performed during the hospital encounter of 09/03/22 (from the past 48 hour(s))  CBC     Status: Abnormal   Collection Time: 09/03/22  4:50 AM  Result Value Ref Range   WBC 14.0 (H) 4.0 - 10.5 K/uL   RBC 4.47 4.22 - 5.81 MIL/uL   Hemoglobin 12.9 (L) 13.0 - 17.0 g/dL   HCT 16.1 09.6 - 04.5 %   MCV 89.0 80.0 - 100.0 fL   MCH 28.9 26.0 - 34.0 pg   MCHC 32.4 30.0 - 36.0 g/dL   RDW 40.9 81.1 - 91.4 %   Platelets 126 (L) 150 - 400 K/uL    Comment: REPEATED TO VERIFY   nRBC 0.0 0.0 - 0.2 %    Comment: Performed at [facility], [address] [place], [place] [address]  Comprehensive metabolic panel     Status: Abnormal   Collection Time: 09/03/22  4:50 AM  Result Value Ref Range   Sodium 133 (L) 135 - 145 mmol/L   Potassium 3.6 3.5 - 5.1 mmol/L   Chloride 90 (L) 98 - 111 mmol/L   CO2 36 (H) 22 - 32 mmol/L   Glucose, Bld 116 (H) 70 - 99 mg/dL    Comment: Glucose reference range applies only to  samples taken after fasting for at least 8 hours.   BUN 16 8 - 23 mg/dL   Creatinine, Ser 6.21 (L) 0.61 - 1.24 mg/dL   Calcium 8.6 (L) 8.9 - 10.3 mg/dL   Total Protein 5.9 (L) 6.5 - 8.1 g/dL   Albumin 2.8 (L) 3.5 - 5.0 g/dL   AST 59 (H) 15 - 41 U/L   ALT 94 (H) 0 - 44 U/L   Alkaline Phosphatase 65 38 - 126 U/L   Total Bilirubin 2.1 (H) 0.3 - 1.2 mg/dL   GFR, Estimated >30 >  60 mL/min    Comment: (NOTE) Calculated using the CKD-EPI Creatinine Equation (2021)    Anion gap 7 5 - 15    Comment: Performed at [facility], [address] [place], [place] [address]  Magnesium     Status: None   Collection Time: 09/03/22  4:50 AM  Result Value Ref Range   Magnesium 2.1 1.7 - 2.4 mg/dL    Comment: Performed at [facility], [address], [place], [place] [address]  Phosphorus     Status: None   Collection Time: 09/03/22  4:50 AM  Result Value Ref Range   Phosphorus 2.7 2.5 - 4.6 mg/dL    Comment: Performed at [facility], [address] [place], [place] [address]  Brain natriuretic peptide     Status: None   Collection Time: 09/03/22  4:50 AM  Result Value Ref Range   B Natriuretic Peptide 59.1 0.0 - 100.0 pg/mL    Comment: Performed at [facility], [address] [place], [place] [address]  Glucose, capillary     Status: Abnormal   Collection Time: 09/03/22  5:29 AM  Result Value Ref Range   Glucose-Capillary 119 (H) 70 - 99 mg/dL    Comment: Glucose reference range applies only to samples taken after fasting for at least 8 hours.  I-STAT 7, (LYTES, BLD GAS, ICA, H+H)     Status: Abnormal   Collection Time: 09/03/22  9:45 AM  Result Value Ref Range   pH, Arterial 7.442 7.35 - 7.45   pCO2 arterial 57.2 (H) 32 - 48 mmHg   pO2, Arterial 82 (L) 83 - 108 mmHg   Bicarbonate 39.2 (H) 20.0 - 28.0 mmol/L   TCO2 41 (H) 22 - 32 mmol/L   O2 Saturation 96 %   Acid-Base Excess 12.0 (H) 0.0 - 2.0 mmol/L   Sodium 135 135 - 145 mmol/L   Potassium 3.7 3.5 - 5.1  mmol/L   Calcium, Ion 1.23 1.15 - 1.40 mmol/L   HCT 40.0 39.0 - 52.0 %   Hemoglobin 13.6 13.0 - 17.0 g/dL   Patient temperature 29.5 F    Sample type ARTERIAL    DG Chest Port 1 View  Result Date: 09/03/2022 CLINICAL DATA:  Small-bowel obstruction. EXAM: PORTABLE CHEST 1 VIEW COMPARISON:  Earlier radiograph dated 09/02/2022. FINDINGS: Enteric tube with tip just distal to the GE junction. The side port of the tube appears in the distal esophagus. Recommend further advancing by additional 9 cm. There is air distention of small-bowel loops. Air is noted throughout the colon. Small bilateral pleural effusions and bibasilar consolidative changes. No pneumothorax. Stable cardiac silhouette. No acute osseous pathology. IMPRESSION: 1. Enteric tube with tip in the proximal stomach. Recommend further advancing by additional 9 cm. 2. Small bilateral pleural effusions and bibasilar consolidative changes. Electronically Signed   By: Elgie Collard M.D.   On: 09/03/2022 03:07   DG Abd Portable 1V  Result Date: 09/03/2022 CLINICAL DATA:  Small-bowel obstruction. EXAM: PORTABLE CHEST 1 VIEW COMPARISON:  Earlier radiograph dated 09/02/2022. FINDINGS: Enteric tube with tip just distal to the GE junction. The side port of the tube appears in the distal esophagus. Recommend further advancing by additional 9 cm. There is air distention of small-bowel loops. Air is noted throughout the colon. Small bilateral pleural effusions and bibasilar consolidative changes. No pneumothorax. Stable cardiac silhouette. No acute osseous pathology. IMPRESSION: 1. Enteric tube with tip in the proximal stomach. Recommend further advancing  by additional 9 cm. 2. Small bilateral pleural effusions and bibasilar consolidative changes. Electronically Signed   By: Elgie Collard M.D.   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 - no indication for emergent surgical intervention, hopefully this will resolve without surgical intervention   FEN: NPO, IVF per TRH, NGT to LIWS VTE: LMWH ID: no current abx   I reviewed hospitalist notes, last 24 h vitals and pain scores, last 48 h intake and output, last 24 h labs and trends, and last 24 h imaging results.   Juliet Rude, [facility] 09/03/2022, 10:16 AM Please see Amion for pager number during day hours 7:00am-4:30pm

## 2022-09-04 ENCOUNTER — Inpatient Hospital Stay (HOSPITAL_COMMUNITY): Payer: Medicare Other

## 2022-09-04 DIAGNOSIS — K56609 Unspecified intestinal obstruction, unspecified as to partial versus complete obstruction: Secondary | ICD-10-CM | POA: Diagnosis not present

## 2022-09-04 MED ORDER — ACETAMINOPHEN 325 MG PO TABS
650.0000 mg | ORAL_TABLET | Freq: Four times a day (QID) | ORAL | Status: DC | PRN
  Administered 2022-09-06 – 2022-09-08 (×7): 650 mg via ORAL
  Filled 2022-09-04 (×7): qty 2

## 2022-09-04 MED ORDER — ACETAMINOPHEN 325 MG PO TABS
650.0000 mg | ORAL_TABLET | Freq: Four times a day (QID) | ORAL | Status: DC | PRN
  Administered 2022-09-04: 650 mg via ORAL
  Filled 2022-09-04 (×2): qty 2

## 2022-09-04 MED ORDER — METOPROLOL TARTRATE 50 MG PO TABS
100.0000 mg | ORAL_TABLET | Freq: Two times a day (BID) | ORAL | Status: DC
  Administered 2022-09-04 – 2022-09-06 (×6): 100 mg via ORAL
  Filled 2022-09-04 (×8): qty 2

## 2022-09-04 MED ORDER — KETOROLAC TROMETHAMINE 15 MG/ML IJ SOLN: 7.5000 mg | Freq: Four times a day (QID) | INTRAMUSCULAR | Status: AC | PRN

## 2022-09-04 MED ORDER — ACETAMINOPHEN 650 MG RE SUPP: 650.0000 mg | Freq: Four times a day (QID) | RECTAL | Status: DC | PRN

## 2022-09-04 NOTE — Progress Notes (Signed)
Resmed Bipap set up in patient's room by this RT with nasal mask as patient requested. After 10 min and adjusting pressures throughout the 10 min to try make pt as comfortable as possible on equipment, patient removed it stating it doesn't feel like his home bipap and he couldn't tolerate. Offered patient a full face mask and he stated he couldn't tolerate a full face mask. Pt placed back on nasal cannula at 3LPM. Equipment left at bedside. HR 68, Spo2 98%

## 2022-09-04 NOTE — Progress Notes (Signed)
PROGRESS NOTE    Wayden Linstrom  ZOX:[id number] DOB: 10-16-1950 DOA: 09/03/2022 PCP: Gilmore Laroche, FNP   Brief Narrative: Manav Sigel is a 72 y.o. male with a history of morbid obesity, COPD, chronic respiratory failure with hypoxia and hypercapnia, hypertension, diastolic heart failure. Patient was transferred from [facility] for surgical intervention of ongoing small bowel obstruction. Patient was initially hospitalized for respiratory failure requiring intubation and mechanical ventilation; successfully extubated on 5/13. Patient was treated for acute heart failure and pneumonia with diuresis and antibiotics respectively. Patient with prolonged NG tube requirement and started on TPN.   Assessment and Plan:  Small bowel obstruction Present on admission. Per chart review, it appears patient has been managed on NG suction and TPN for nutrition at outside hospital. Per outside records, patient was having bowel movements, but was still transferred to [facility] for surgical intervention. Abdomen is soft. SBO protocol initiated on 5/22 with contrast seen in the colon. NG tube removed 5/23. -General surgery recommendations: Remove NGT, clear liquid diet  Chronic respiratory failure with hypoxia and hypercapnia Patient uses 4 L/min of oxygen at baseline. He is required to use CPAP, but from chart review, is not completely adherent with regimen. Possible acute respiratory failure at this time with patient presentation. -Continue oxygen at 4 L/min -PT/OT  Atrial fibrillation Typical atrial flutter Patient is s/p CTI ablation in 2017. Patient is followed by EP and has failed sotalol and dofetilide (secondary to prolonged QTc). He is currently managed on metoprolol tartrate 100 mg BID which is held. -Resume home metoprolol  Chest pain Does not appear to be typical. Possibly related to hypoxia, as patient was found to have oxygen saturations in the 70s during his episode. EKG  without specific ST-T segment changes. Chest pain improving after oxygen. Troponin low and flat at 15 > 15. Chest pain resolved.  Chronic diastolic heart failure Patient is euvolemic. Last LVEF of 55-60%. Patient is managed on torsemide as an outpatient. -Hold torsemide for now  Hyperlipidemia -Continue home Lipitor once able to take PO consistently  Primary hypertension Normotensive at this time.  OSA Per outside records, patient was managed with NIV as an outpatient but has been non-adherent. During hospitalization, he has been managed with BiPAP qHS. -Continue BiPAP qHS  Foley in place Present on admission from outside hospital -Remove foley; voiding trial  Obesity Estimated body mass index is 37.87 kg/m as calculated from the following:   Height as of this encounter: 6\' 2"  (1.88 m).   Weight as of this encounter: 133.8 kg.    DVT prophylaxis: SCDs Code Status:   Code Status: Full Code Family Communication: None at bedside Disposition Plan: Discharge pending ongoing general surgery recommendations and ability to advance diet. Likely discharge in 2 days   Consultants:  General surgery  Procedures:  None  Antimicrobials: None    Subjective: Patient without abdominal pain, nausea or vomiting. Patient feels well. Thirsty.  Objective: BP 117/64 (BP Location: Left Arm)   Pulse (!) 116   Temp 97.7 F (36.5 C) (Oral)   Resp 18   Ht 6\' 2"  (1.88 m)   Wt 133.8 kg   SpO2 97%   BMI 37.87 kg/m   Examination:  General exam: Appears calm and comfortable Respiratory system: Clear to auscultation. Respiratory effort normal. Cardiovascular system: S1 & S2 heard, RRR. No murmurs, rubs, gallops or clicks. Gastrointestinal system: Abdomen is soft and nontender. Normal bowel sounds heard. Central nervous system: Alert and oriented. No focal neurological  deficits. Musculoskeletal: No calf tenderness Psychiatry: Judgement and insight appear normal. Mood & affect  appropriate.    Data Reviewed: I have personally reviewed following labs and imaging studies  CBC Lab Results  Component Value Date   WBC 14.0 (H) 09/03/2022   RBC 4.47 09/03/2022   HGB 13.6 09/03/2022   HCT 40.0 09/03/2022   MCV 89.0 09/03/2022   MCH 28.9 09/03/2022   PLT 126 (L) 09/03/2022   MCHC 32.4 09/03/2022   RDW 13.7 09/03/2022   LYMPHSABS 0.9 10/03/2021   MONOABS 0.6 10/03/2021   EOSABS 0.3 10/03/2021   BASOSABS 0.1 10/03/2021     Last metabolic panel Lab Results  Component Value Date   NA 135 09/03/2022   K 3.7 09/03/2022   CL 90 (L) 09/03/2022   CO2 36 (H) 09/03/2022   BUN 16 09/03/2022   CREATININE 0.54 (L) 09/03/2022   GLUCOSE 116 (H) 09/03/2022   GFRNONAA >60 09/03/2022   GFRAA >60 11/30/2019   CALCIUM 8.6 (L) 09/03/2022   PHOS 2.7 09/03/2022   PROT 5.9 (L) 09/03/2022   ALBUMIN 2.8 (L) 09/03/2022   BILITOT 2.1 (H) 09/03/2022   ALKPHOS 65 09/03/2022   AST 59 (H) 09/03/2022   ALT 94 (H) 09/03/2022   ANIONGAP 7 09/03/2022    GFR: Estimated Creatinine Clearance: 121.4 mL/min (A) (by C-G formula based on SCr of 0.54 mg/dL (L)).  No results found for this or any previous visit (from the past 240 hour(s)).    Radiology Studies: DG Abd 1 View  Result Date: 09/04/2022 CLINICAL DATA:  Small-bowel obstruction EXAM: ABDOMEN - 1 VIEW COMPARISON:  09/03/2022 FINDINGS: Orogastric or nasogastric tube enters the stomach with the tip in the fundus. Previously administered contrast has passed almost entirely into the colon. Some persistent small bowel dilatation is noted. Findings could be consistent with resolving or partial small bowel obstruction. IMPRESSION: Previously administered contrast has passed almost entirely into the colon. Some persistent small bowel dilatation. Findings could be consistent with resolving or partial small bowel obstruction. Electronically Signed   By: Paulina Fusi M.D.   On: 09/04/2022 08:49   DG Abd Portable 1V-Small Bowel  Obstruction Protocol-initial, 8 hr delay  Result Date: 09/03/2022 CLINICAL DATA:  8 hour delay. EXAM: PORTABLE ABDOMEN - 1 VIEW COMPARISON:  09/03/2022. FINDINGS: Distended loops of small bowel are noted in the abdomen measuring up to 5.1 cm. Contrast is noted in the small bowel in the pelvis. Air is seen in the visualized colon. An enteric tube terminates in the stomach. There are small bilateral pleural effusions with atelectasis or infiltrate at the lung bases. The heart is enlarged. IMPRESSION: Distended loops of gas filled small bowel in the abdomen, not significantly changed from the prior exam. Contrast is seen in the small bowel in the pelvis. There is insufficient imaging of the right lower quadrant and ascending colon. Additional views are recommended. Electronically Signed   By: Thornell Sartorius M.D.   On: 09/03/2022 20:53   DG CHEST PORT 1 VIEW  Result Date: 09/03/2022 CLINICAL DATA:  Chest pain.  Hypoxia. EXAM: PORTABLE CHEST 1 VIEW COMPARISON:  Radiographs earlier the same date, 09/02/2022 and 10/03/2021. CT 08/29/2022. FINDINGS: 0947 hours. Enteric tube has been advanced, tip projecting over the gastric fundus. The side hole is not well seen on the current study, but appears to be below the gastroesophageal junction. Right arm PICC appears unchanged near the superior cavoatrial junction. Unchanged cardiomegaly, left greater than right pleural effusions and associated left greater  than right basilar airspace opacities. No evidence of pneumothorax. The bones appear unchanged. Telemetry leads overlie the chest. IMPRESSION: Interval advancement of the enteric tube, tip projecting over the gastric fundus. No other significant changes. Electronically Signed   By: Carey Bullocks M.D.   On: 09/03/2022 10:22   DG Chest Port 1 View  Result Date: 09/03/2022 CLINICAL DATA:  Small-bowel obstruction. EXAM: PORTABLE CHEST 1 VIEW COMPARISON:  Earlier radiograph dated 09/02/2022. FINDINGS: Enteric tube with  tip just distal to the GE junction. The side port of the tube appears in the distal esophagus. Recommend further advancing by additional 9 cm. There is air distention of small-bowel loops. Air is noted throughout the colon. Small bilateral pleural effusions and bibasilar consolidative changes. No pneumothorax. Stable cardiac silhouette. No acute osseous pathology. IMPRESSION: 1. Enteric tube with tip in the proximal stomach. Recommend further advancing by additional 9 cm. 2. Small bilateral pleural effusions and bibasilar consolidative changes. Electronically Signed   By: Elgie Collard M.D.   On: 09/03/2022 03:07   DG Abd Portable 1V  Result Date: 09/03/2022 CLINICAL DATA:  Small-bowel obstruction. EXAM: PORTABLE CHEST 1 VIEW COMPARISON:  Earlier radiograph dated 09/02/2022. FINDINGS: Enteric tube with tip just distal to the GE junction. The side port of the tube appears in the distal esophagus. Recommend further advancing by additional 9 cm. There is air distention of small-bowel loops. Air is noted throughout the colon. Small bilateral pleural effusions and bibasilar consolidative changes. No pneumothorax. Stable cardiac silhouette. No acute osseous pathology. IMPRESSION: 1. Enteric tube with tip in the proximal stomach. Recommend further advancing by additional 9 cm. 2. Small bilateral pleural effusions and bibasilar consolidative changes. Electronically Signed   By: Elgie Collard M.D.   On: 09/03/2022 03:07      LOS: 1 day    Jacquelin Hawking, MD [facility] 09/04/2022, 11:59 AM   If 7PM-7AM, please contact night-coverage [email or web address]

## 2022-09-04 NOTE — Progress Notes (Signed)
Subjective: No complaints.  Sitting in his chair.  Passing flatus.  Last good BM 2 days ago but had a little more liquid stool yesterday.  He reports he took pepto bismol and imodium together for about 4-5 days prior to all of this starting.  ROS: See above, otherwise other systems negative  Objective: Vital signs in last 24 hours: Temp:  [97.6 F (36.4 C)-98.1 F (36.7 C)] 97.6 F (36.4 C) (05/23 0759) Pulse Rate:  [79-94] 79 (05/23 0759) Resp:  [18-20] 19 (05/23 0759) BP: (114-148)/(71-86) 148/72 (05/23 0759) SpO2:  [96 %-100 %] 96 % (05/23 0759) Last BM Date : 09/03/22  Intake/Output from previous day: 05/22 0701 - 05/23 0700 In: 1279.2 [I.V.:1189.2; NG/GT:90] Out: 1250 [Urine:700; Emesis/NG output:550] Intake/Output this shift: No intake/output data recorded.  PE: Gen: NAD Abd: soft, obese, NT, NGT with some bilious output noted  Lab Results:  Recent Labs    09/03/22 0450 09/03/22 0945  WBC 14.0*  --   HGB 12.9* 13.6  HCT 39.8 40.0  PLT 126*  --    BMET Recent Labs    09/03/22 0450 09/03/22 0945  NA 133* 135  K 3.6 3.7  CL 90*  --   CO2 36*  --   GLUCOSE 116*  --   BUN 16  --   CREATININE 0.54*  --   CALCIUM 8.6*  --    PT/INR No results for input(s): "LABPROT", "INR" in the last 72 hours. CMP     Component Value Date/Time   NA 135 09/03/2022 0945   NA 147 (H) 09/27/2021 1616   K 3.7 09/03/2022 0945   CL 90 (L) 09/03/2022 0450   CO2 36 (H) 09/03/2022 0450   GLUCOSE 116 (H) 09/03/2022 0450   BUN 16 09/03/2022 0450   BUN 18 09/27/2021 1616   CREATININE 0.54 (L) 09/03/2022 0450   CREATININE 0.66 (L) 10/07/2018 1356   CALCIUM 8.6 (L) 09/03/2022 0450   PROT 5.9 (L) 09/03/2022 0450   ALBUMIN 2.8 (L) 09/03/2022 0450   AST 59 (H) 09/03/2022 0450   ALT 94 (H) 09/03/2022 0450   ALKPHOS 65 09/03/2022 0450   BILITOT 2.1 (H) 09/03/2022 0450   GFRNONAA >60 09/03/2022 0450   GFRNONAA 99 10/07/2018 1356   GFRAA >60 11/30/2019 0412   GFRAA 115  10/07/2018 1356   Lipase     Component Value Date/Time   LIPASE 21 11/20/2020 1550       Studies/Results: DG Abd 1 View  Result Date: 09/04/2022 CLINICAL DATA:  Small-bowel obstruction EXAM: ABDOMEN - 1 VIEW COMPARISON:  09/03/2022 FINDINGS: Orogastric or nasogastric tube enters the stomach with the tip in the fundus. Previously administered contrast has passed almost entirely into the colon. Some persistent small bowel dilatation is noted. Findings could be consistent with resolving or partial small bowel obstruction. IMPRESSION: Previously administered contrast has passed almost entirely into the colon. Some persistent small bowel dilatation. Findings could be consistent with resolving or partial small bowel obstruction. Electronically Signed   By: Paulina Fusi M.D.   On: 09/04/2022 08:49   DG Abd Portable 1V-Small Bowel Obstruction Protocol-initial, 8 hr delay  Result Date: 09/03/2022 CLINICAL DATA:  8 hour delay. EXAM: PORTABLE ABDOMEN - 1 VIEW COMPARISON:  09/03/2022. FINDINGS: Distended loops of small bowel are noted in the abdomen measuring up to 5.1 cm. Contrast is noted in the small bowel in the pelvis. Air is seen in the visualized colon. An enteric tube terminates in  the stomach. There are small bilateral pleural effusions with atelectasis or infiltrate at the lung bases. The heart is enlarged. IMPRESSION: Distended loops of gas filled small bowel in the abdomen, not significantly changed from the prior exam. Contrast is seen in the small bowel in the pelvis. There is insufficient imaging of the right lower quadrant and ascending colon. Additional views are recommended. Electronically Signed   By: Thornell Sartorius M.D.   On: 09/03/2022 20:53   DG CHEST PORT 1 VIEW  Result Date: 09/03/2022 CLINICAL DATA:  Chest pain.  Hypoxia. EXAM: PORTABLE CHEST 1 VIEW COMPARISON:  Radiographs earlier the same date, 09/02/2022 and 10/03/2021. CT 08/29/2022. FINDINGS: 0947 hours. Enteric tube has been  advanced, tip projecting over the gastric fundus. The side hole is not well seen on the current study, but appears to be below the gastroesophageal junction. Right arm PICC appears unchanged near the superior cavoatrial junction. Unchanged cardiomegaly, left greater than right pleural effusions and associated left greater than right basilar airspace opacities. No evidence of pneumothorax. The bones appear unchanged. Telemetry leads overlie the chest. IMPRESSION: Interval advancement of the enteric tube, tip projecting over the gastric fundus. No other significant changes. Electronically Signed   By: Carey Bullocks M.D.   On: 09/03/2022 10:22   DG Chest Port 1 View  Result Date: 09/03/2022 CLINICAL DATA:  Small-bowel obstruction. EXAM: PORTABLE CHEST 1 VIEW COMPARISON:  Earlier radiograph dated 09/02/2022. FINDINGS: Enteric tube with tip just distal to the GE junction. The side port of the tube appears in the distal esophagus. Recommend further advancing by additional 9 cm. There is air distention of small-bowel loops. Air is noted throughout the colon. Small bilateral pleural effusions and bibasilar consolidative changes. No pneumothorax. Stable cardiac silhouette. No acute osseous pathology. IMPRESSION: 1. Enteric tube with tip in the proximal stomach. Recommend further advancing by additional 9 cm. 2. Small bilateral pleural effusions and bibasilar consolidative changes. Electronically Signed   By: Elgie Collard M.D.   On: 09/03/2022 03:07   DG Abd Portable 1V  Result Date: 09/03/2022 CLINICAL DATA:  Small-bowel obstruction. EXAM: PORTABLE CHEST 1 VIEW COMPARISON:  Earlier radiograph dated 09/02/2022. FINDINGS: Enteric tube with tip just distal to the GE junction. The side port of the tube appears in the distal esophagus. Recommend further advancing by additional 9 cm. There is air distention of small-bowel loops. Air is noted throughout the colon. Small bilateral pleural effusions and bibasilar  consolidative changes. No pneumothorax. Stable cardiac silhouette. No acute osseous pathology. IMPRESSION: 1. Enteric tube with tip in the proximal stomach. Recommend further advancing by additional 9 cm. 2. Small bilateral pleural effusions and bibasilar consolidative changes. Electronically Signed   By: Elgie Collard M.D.   On: 09/03/2022 03:07    Anti-infectives: Anti-infectives (From admission, onward)    None        Assessment/Plan Ileus -SBO protocol with contrast in colon.  No evidence of obstruction -given bowel function, will DC NGT and give CLD -suspect bowel dysmotility from hospitalization and use of imodium for multiple days prior.  This should resolve on its own with time -mobilize, pulm toilet -no surgical needs at this time   FEN - CLD VTE - Lovenox ID - none needed  I reviewed hospitalist notes, last 24 h vitals and pain scores, last 48 h intake and output, last 24 h labs and trends, and last 24 h imaging results.   LOS: 1 day    Letha Cape , [facility]  09/04/2022, 10:28 AM Please see Amion for pager number during day hours 7:00am-4:30pm or 7:00am -11:30am on weekends

## 2022-09-05 DIAGNOSIS — K56609 Unspecified intestinal obstruction, unspecified as to partial versus complete obstruction: Secondary | ICD-10-CM | POA: Diagnosis not present

## 2022-09-05 LAB — COMPREHENSIVE METABOLIC PANEL
ALT: 99 U/L — ABNORMAL HIGH (ref 0–44)
AST: 38 U/L (ref 15–41)
Albumin: 2.7 g/dL — ABNORMAL LOW (ref 3.5–5.0)
Alkaline Phosphatase: 69 U/L (ref 38–126)
Anion gap: 9 (ref 5–15)
BUN: 13 mg/dL (ref 8–23)
CO2: 32 mmol/L (ref 22–32)
Calcium: 8.7 mg/dL — ABNORMAL LOW (ref 8.9–10.3)
Chloride: 93 mmol/L — ABNORMAL LOW (ref 98–111)
Creatinine, Ser: 0.56 mg/dL — ABNORMAL LOW (ref 0.61–1.24)
GFR, Estimated: >60 mL/min (ref >60)
Glucose, Bld: 95 mg/dL (ref 70–99)
Potassium: 3.8 mmol/L (ref 3.5–5.1)
Sodium: 134 mmol/L — ABNORMAL LOW (ref 135–145)
Total Bilirubin: 2.1 mg/dL — ABNORMAL HIGH (ref 0.3–1.2)
Total Protein: 5.6 g/dL — ABNORMAL LOW (ref 6.5–8.1)

## 2022-09-05 LAB — CBC
HCT: 40.5 % (ref 39.0–52.0)
Hemoglobin: 13 g/dL (ref 13.0–17.0)
MCH: 29.1 pg (ref 26.0–34.0)
MCHC: 32.1 g/dL (ref 30.0–36.0)
MCV: 90.6 fL (ref 80.0–100.0)
Platelets: 98 10*3/uL — ABNORMAL LOW (ref 150–400)
RBC: 4.47 MIL/uL (ref 4.22–5.81)
RDW: 13.8 % (ref 11.5–15.5)
WBC: 9.7 10*3/uL (ref 4.0–10.5)
nRBC: 0 % (ref 0.0–0.2)

## 2022-09-05 MED ORDER — ENOXAPARIN SODIUM 40 MG/0.4ML IJ SOSY
40.0000 mg | PREFILLED_SYRINGE | INTRAMUSCULAR | Status: DC
  Administered 2022-09-06 – 2022-09-09 (×4): 40 mg via SUBCUTANEOUS
  Filled 2022-09-05 (×4): qty 0.4

## 2022-09-05 MED ORDER — POLYETHYLENE GLYCOL 3350 17 G PO PACK
17.0000 g | PACK | Freq: Every day | ORAL | Status: DC | PRN
  Administered 2022-09-05: 17 g via ORAL
  Filled 2022-09-05: qty 1

## 2022-09-05 MED ORDER — APIXABAN 5 MG PO TABS: 5.0000 mg | ORAL_TABLET | Freq: Two times a day (BID) | ORAL | Status: DC

## 2022-09-05 NOTE — Progress Notes (Signed)
Inpatient Rehab Admissions Coordinator:  ? ?Per therapy recommendations,  patient was screened for CIR candidacy by Gaspar Fowle, MS, CCC-SLP. At this time, Pt. Appears to be a a potential candidate for CIR. I will place   order for rehab consult per protocol for full assessment. Please contact me any with questions. ? ?Finnbar Cedillos, MS, CCC-SLP ?Rehab Admissions Coordinator  ?[phone] (celll) ?[phone] (office) ? ?

## 2022-09-05 NOTE — Evaluation (Signed)
Occupational Therapy Evaluation Patient Details Name: Jesus Rodriguez MRN: [id number] DOB: 09-06-1950 Today's Date: 09/05/2022   History of Present Illness 72 y.o. male presents to [facility] hospital on 09/03/2022 as a transfer from [facility] with concern for SBO. Pt was initially hospitalized there with acute on chronic respiratory failure 2/2 CHF and PNA, pt required intubation, extubated 5/13. PMH includes afib, HTN, HLD, RA, scrotal cancer.   Clinical Impression   PTA pt lives at home and has 24/7 assistance available if needed. At baseline pt ambulates with a RW or rollator "if needed" and has assistance from a caregiver/friend for meals and house cleaning. Pt demonstrates a significant functional decline due to below noted deficits, requiring +2 mod A with limited mobility and Max A with ADL tasks. Patient will benefit from intensive inpatient follow up therapy, >3 hours/day. Pt very motivated to get stronger and DC home. Acute OT to follow.  Pt with SOB and increased WOB on 4 L during mobility however SpO2 remained above 90 (poor pleth).     Recommendations for follow up therapy are one component of a multi-disciplinary discharge planning process, led by the attending physician.  Recommendations may be updated based on patient status, additional functional criteria and insurance authorization.   Assistance Recommended at Discharge Frequent or constant Supervision/Assistance  Patient can return home with the following A lot of help with walking and/or transfers;A lot of help with bathing/dressing/bathroom;Direct supervision/assist for medications management;Assistance with cooking/housework;Assist for transportation    Functional Status Assessment  Patient has had a recent decline in their functional status and demonstrates the ability to make significant improvements in function in a reasonable and predictable amount of time.  Equipment Recommendations  None recommended by OT     Recommendations for Other Services Rehab consult     Precautions / Restrictions Precautions Precautions: Fall Precaution Comments: watch O2 sats      Mobility Bed Mobility Overal bed mobility: Needs Assistance Bed Mobility: Supine to Sit     Supine to sit: Mod assist, +2 for safety/equipment          Transfers Overall transfer level: Needs assistance Equipment used: Rolling walker (2 wheels) Transfers: Sit to/from Stand, Bed to chair/wheelchair/BSC Sit to Stand: Mod assist, +2 physical assistance, From elevated surface Stand pivot transfers: Mod assist, +2 physical assistance, +2 safety/equipment, From elevated surface         General transfer comment: VC for hand position; poor controlled descnt to chair; high risk for falls      Balance Overall balance assessment: Needs assistance Sitting-balance support: Feet supported Sitting balance-Leahy Scale: Fair       Standing balance-Leahy Scale: Poor                             ADL either performed or assessed with clinical judgement   ADL Overall ADL's : Needs assistance/impaired Eating/Feeding: Set up   Grooming: Set up;Sitting   Upper Body Bathing: Set up;Sitting   Lower Body Bathing: Maximal assistance;Sit to/from stand   Upper Body Dressing : Minimal assistance;Sitting   Lower Body Dressing: Maximal assistance;Sit to/from stand   Toilet Transfer: Moderate assistance;+2 for physical assistance;Stand-pivot;Rolling walker (2 wheels) (simulated)   Toileting- Clothing Manipulation and Hygiene: Total assistance Toileting - Clothing Manipulation Details (indicate cue type and reason): unable to release RW to wipe     Functional mobility during ADLs: Moderate assistance;+2 for physical assistance;Cueing for safety;Cueing for sequencing;Rolling walker (2 wheels) General ADL  Comments: May benefit from AE; poor actibity tolerance     Vision Baseline Vision/History: 0 No visual deficits (lens  implants)       Perception     Praxis      Pertinent Vitals/Pain Pain Assessment Pain Assessment: 0-10 Pain Score: 6  Pain Location: L shoulder Pain Descriptors / Indicators: Aching, Discomfort Pain Intervention(s): Limited activity within patient's tolerance     Hand Dominance Right   Extremity/Trunk Assessment Upper Extremity Assessment Upper Extremity Assessment: Generalized weakness (shoulder ROM deficits at baseline with apparent RTC insufficiency)   Lower Extremity Assessment Lower Extremity Assessment: Defer to PT evaluation   Cervical / Trunk Assessment Cervical / Trunk Assessment: Kyphotic;Other exceptions (forward head)   Communication Communication Communication: HOH   Cognition Arousal/Alertness: Awake/alert Behavior During Therapy: WFL for tasks assessed/performed Overall Cognitive Status: No family/caregiver present to determine baseline cognitive functioning                                 General Comments: decreased awareness regarding safety and ability to DC home     General Comments       Exercises Exercises: Other exercises Other Exercises Other Exercises: pursed lip breathing   Shoulder Instructions      Home Living Family/patient expects to be discharged to:: Private residence Living Arrangements: Children Available Help at Discharge: Available 24 hours/day Type of Home: Mobile home Home Access: Ramped entrance     Home Layout: One level     Bathroom Shower/Tub: Walk-in shower (walk in tub)   Bathroom Toilet: Handicapped height Bathroom Accessibility: Yes How Accessible: Accessible via walker Home Equipment: Rolling Walker (2 wheels);Rollator (4 wheels);BSC/3in1;Grab bars - toilet;Grab bars - tub/shower;Wheelchair - Manufacturing systems engineer          Prior Functioning/Environment Prior Level of Function : Independent/Modified Independent             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, drives, 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; states he has been on 4 recently ADLs Comments: assisted with IADL by family; drives; does his own medication and financial managment        OT Problem List: Decreased strength;Decreased range of motion;Decreased activity tolerance;Impaired balance (sitting and/or standing);Decreased safety awareness;Cardiopulmonary status limiting activity;Obesity      OT Treatment/Interventions: Self-care/ADL training;Therapeutic exercise;Energy conservation;DME and/or AE instruction;Therapeutic activities;Patient/family education;Balance training    OT Goals(Current goals can be found in the care plan section) Acute Rehab OT Goals Patient Stated Goal: to get stronger and go home OT Goal Formulation: With patient Time For Goal Achievement: 09/19/22 Potential to Achieve Goals: Good  OT Frequency: Min 2X/week    Co-evaluation PT/OT/SLP Co-Evaluation/Treatment: Yes Reason for Co-Treatment: For patient/therapist safety;To address functional/ADL transfers   OT goals addressed during session: ADL's and self-care;Strengthening/ROM      AM-PAC OT "6 Clicks" Daily Activity     Outcome Measure Help from another person eating meals?: A Little Help from another person taking care of personal grooming?: A Little Help from another person toileting, which includes using toliet, bedpan, or urinal?: Total Help from another person bathing (including washing, rinsing, drying)?: A Lot Help from another person to put on and taking off regular upper body clothing?: A Little Help from another person to put on and taking off regular lower body clothing?: A Lot 6 Click Score: 14   End of Session Equipment Utilized During Treatment: Gait belt;Rolling walker (2 wheels);Oxygen (  2-4 L; incresed to 4L during mobility) Nurse Communication: Mobility status  Activity Tolerance: Patient tolerated treatment well Patient left: in chair;with call bell/phone within  reach;with chair alarm set  OT Visit Diagnosis: Unsteadiness on feet (R26.81);Other abnormalities of gait and mobility (R26.89);Muscle weakness (generalized) (M62.81)                Time: 1004-1030 OT Time Calculation (min): 26 min Charges:  OT General Charges $OT Visit: 1 Visit OT Evaluation $OT Eval Moderate Complexity: 1 Mod  Maite Burlison, OT/L   Acute OT Clinical Specialist Acute Rehabilitation Services Pager [phone] Office [phone]   [facility] 09/05/2022, 10:42 AM

## 2022-09-05 NOTE — Progress Notes (Addendum)
PROGRESS NOTE    Jesus Rodriguez  ZOX:[id number] DOB: 1950-10-19 DOA: 09/03/2022 PCP: Gilmore Laroche, FNP   Brief Narrative: Jesus Rodriguez is a 72 y.o. male with a history of morbid obesity, COPD, chronic respiratory failure with hypoxia and hypercapnia, hypertension, diastolic heart failure. Patient was transferred from [facility] for surgical intervention of ongoing small bowel obstruction. Patient was initially hospitalized for respiratory failure requiring intubation and mechanical ventilation; successfully extubated on 5/13. Patient was treated for acute heart failure and pneumonia with diuresis and antibiotics respectively. Patient with prolonged NG tube requirement and started on TPN.   Assessment and Plan:  Small bowel obstruction Present on admission. Per chart review, it appears patient has been managed on NG suction and TPN for nutrition at outside hospital. Per outside records, patient was having bowel movements, but was still transferred to [facility] for surgical intervention. Abdomen is soft. SBO protocol initiated on 5/22 with contrast seen in the colon. NG tube removed 5/23. -General surgery recommendations: Remove NGT, clear liquid diet  Chronic respiratory failure with hypoxia and hypercapnia Patient uses 4 L/min of oxygen at baseline. He is required to use CPAP, but from chart review, is not completely adherent with regimen. Possible acute respiratory failure at this time with patient presentation.  Patient with oxygen saturations at 100% at 4 L/min.  PT/OT recommending acute inpatient rehab.  Patient without help at home so we will attempt to discharge to skilled nursing facility. -Decrease oxygen rate with goal of 88 to 92% without worsened symptoms from baseline  Atrial fibrillation Typical atrial flutter Patient is s/p CTI ablation in 2017. Patient is followed by EP and has failed sotalol and dofetilide (secondary to prolonged QTc). He is currently  managed on metoprolol tartrate 100 mg BID which was held secondary to NPO status. -Continue home metoprolol -Continue to hold home Eliquis pending stabilization of thrombocytopenia  Chest pain Does not appear to be typical. Possibly related to hypoxia, as patient was found to have oxygen saturations in the 70s during his episode. EKG without specific ST-T segment changes. Chest pain improving after oxygen. Troponin low and flat at 15 > 15. Chest pain resolved.  Chronic diastolic heart failure Patient is euvolemic. Last LVEF of 55-60%. Patient is managed on torsemide as an outpatient. -Hold torsemide for now  Hyperlipidemia -Continue home Lipitor once able to take PO consistently  Primary hypertension Normotensive at this time.  Thrombocytopenia Slight worsening. Unclear etiology. No evidence of hemorrhaging at this time. -CBC in AM  OSA Per outside records, patient was managed with NIV as an outpatient but has been non-adherent. During hospitalization, he has been managed with BiPAP qHS. -Continue BiPAP qHS  Foley in place Present on admission from outside hospital -Strict input and output  Obesity Estimated body mass index is 38.18 kg/m as calculated from the following:   Height as of this encounter: 6\' 2"  (1.88 m).   Weight as of this encounter: 134.9 kg.    DVT prophylaxis: SCDs Code Status:   Code Status: Full Code Family Communication: None at bedside Disposition Plan: Discharge to SNF pending bed availability.  Medically stable for discharge.   Consultants:  General surgery  Procedures:  None  Antimicrobials: None    Subjective: Patient feels well.  No abdominal pain.  Patient is having bowel movements and passing gas.  Patient is eager to go home.  Objective: BP 111/61 (BP Location: Right Arm)   Pulse 69   Temp 98 F (36.7 C) (Oral)  Resp 20   Ht 6\' 2"  (1.88 m)   Wt 134.9 kg   SpO2 91%   BMI 38.18 kg/m   Examination:  General exam: Appears  calm and comfortable Respiratory system: Clear to auscultation. Respiratory effort normal. Cardiovascular system: S1 & S2 heard, RRR. Gastrointestinal system: Abdomen is soft and nontender. Normal bowel sounds heard. Central nervous system: Alert and oriented. Psychiatry: Mood & affect appropriate.    Data Reviewed: I have personally reviewed following labs and imaging studies  CBC Lab Results  Component Value Date   WBC 9.7 09/05/2022   RBC 4.47 09/05/2022   HGB 13.0 09/05/2022   HCT 40.5 09/05/2022   MCV 90.6 09/05/2022   MCH 29.1 09/05/2022   PLT 98 (L) 09/05/2022   MCHC 32.1 09/05/2022   RDW 13.8 09/05/2022   LYMPHSABS 0.9 10/03/2021   MONOABS 0.6 10/03/2021   EOSABS 0.3 10/03/2021   BASOSABS 0.1 10/03/2021     Last metabolic panel Lab Results  Component Value Date   NA 134 (L) 09/05/2022   K 3.8 09/05/2022   CL 93 (L) 09/05/2022   CO2 32 09/05/2022   BUN 13 09/05/2022   CREATININE 0.56 (L) 09/05/2022   GLUCOSE 95 09/05/2022   GFRNONAA >60 09/05/2022   GFRAA >60 11/30/2019   CALCIUM 8.7 (L) 09/05/2022   PHOS 2.7 09/03/2022   PROT 5.6 (L) 09/05/2022   ALBUMIN 2.7 (L) 09/05/2022   BILITOT 2.1 (H) 09/05/2022   ALKPHOS 69 09/05/2022   AST 38 09/05/2022   ALT 99 (H) 09/05/2022   ANIONGAP 9 09/05/2022    GFR: Estimated Creatinine Clearance: 122 mL/min (A) (by C-G formula based on SCr of 0.56 mg/dL (L)).  No results found for this or any previous visit (from the past 240 hour(s)).    Radiology Studies: DG Abd 1 View  Result Date: 09/04/2022 CLINICAL DATA:  Small-bowel obstruction EXAM: ABDOMEN - 1 VIEW COMPARISON:  09/03/2022 FINDINGS: Orogastric or nasogastric tube enters the stomach with the tip in the fundus. Previously administered contrast has passed almost entirely into the colon. Some persistent small bowel dilatation is noted. Findings could be consistent with resolving or partial small bowel obstruction. IMPRESSION: Previously administered contrast  has passed almost entirely into the colon. Some persistent small bowel dilatation. Findings could be consistent with resolving or partial small bowel obstruction. Electronically Signed   By: Paulina Fusi M.D.   On: 09/04/2022 08:49   DG Abd Portable 1V-Small Bowel Obstruction Protocol-initial, 8 hr delay  Result Date: 09/03/2022 CLINICAL DATA:  8 hour delay. EXAM: PORTABLE ABDOMEN - 1 VIEW COMPARISON:  09/03/2022. FINDINGS: Distended loops of small bowel are noted in the abdomen measuring up to 5.1 cm. Contrast is noted in the small bowel in the pelvis. Air is seen in the visualized colon. An enteric tube terminates in the stomach. There are small bilateral pleural effusions with atelectasis or infiltrate at the lung bases. The heart is enlarged. IMPRESSION: Distended loops of gas filled small bowel in the abdomen, not significantly changed from the prior exam. Contrast is seen in the small bowel in the pelvis. There is insufficient imaging of the right lower quadrant and ascending colon. Additional views are recommended. Electronically Signed   By: Thornell Sartorius M.D.   On: 09/03/2022 20:53      LOS: 2 days    Jacquelin Hawking, MD [facility] 09/05/2022, 11:56 AM   If 7PM-7AM, please contact night-coverage [email or web address]

## 2022-09-05 NOTE — Evaluation (Signed)
Physical Therapy Evaluation Patient Details Name: Jesus Rodriguez MRN: [id number] DOB: 21-Aug-1950 Today's Date: 09/05/2022  History of Present Illness  72 y.o. male presents to [facility] hospital on 09/03/2022 as a transfer from [facility] with concern for SBO. Pt was initially hospitalized there with acute on chronic respiratory failure 2/2 CHF and PNA, pt required intubation, extubated 5/13. PMH includes afib, HTN, HLD, RA, scrotal cancer.  Clinical Impression  Pt presents to PT with deficits in activity tolerance, strength, power, gait, balance. Pt demonstrates significant weakness, requiring 2 person assistance for bed mobility and transfers at this time. When mobilizing the pt fatigues quickly, however he is able to recover quickly with brief seated rest breaks. Pt is at a high risk for falls due to generalized weakness and poor muscular endurance. PT recommends high intensity inpatient PT services a the time of discharge in an effort to restore independence.        Recommendations for follow up therapy are one component of a multi-disciplinary discharge planning process, led by the attending physician.  Recommendations may be updated based on patient status, additional functional criteria and insurance authorization.  Follow Up Recommendations       Assistance Recommended at Discharge Frequent or constant Supervision/Assistance  Patient can return home with the following  A lot of help with walking and/or transfers;A lot of help with bathing/dressing/bathroom;Assistance with cooking/housework;Assist for transportation;Help with stairs or ramp for entrance    Equipment Recommendations  (TBD pending progress)  Recommendations for Other Services  Rehab consult    Functional Status Assessment Patient has had a recent decline in their functional status and demonstrates the ability to make significant improvements in function in a reasonable and predictable amount of time.     Precautions /  Restrictions Precautions Precautions: Fall Precaution Comments: watch O2 sats Restrictions Weight Bearing Restrictions: No      Mobility  Bed Mobility Overal bed mobility: Needs Assistance Bed Mobility: Supine to Sit     Supine to sit: Mod assist, +2 for physical assistance, HOB elevated          Transfers Overall transfer level: Needs assistance Equipment used: Rolling walker (2 wheels) Transfers: Sit to/from Stand, Bed to chair/wheelchair/BSC Sit to Stand: Mod assist, +2 physical assistance, From elevated surface   Step pivot transfers: Mod assist, +2 physical assistance, From elevated surface            Ambulation/Gait Ambulation/Gait assistance:  (unable to tolerate ambulation due to fatigue)                Stairs            Wheelchair Mobility    Modified Rankin (Stroke Patients Only)       Balance Overall balance assessment: Needs assistance Sitting-balance support: No upper extremity supported, Feet supported Sitting balance-Leahy Scale: Good     Standing balance support: Bilateral upper extremity supported, Reliant on assistive device for balance Standing balance-Leahy Scale: Poor                               Pertinent Vitals/Pain Pain Assessment Pain Assessment: 0-10 Pain Score: 6  Pain Location: L shoulder Pain Descriptors / Indicators: Aching, Discomfort Pain Intervention(s): Limited activity within patient's tolerance    Home Living Family/patient expects to be discharged to:: Private residence Living Arrangements: Alone Available Help at Discharge: Available 24 hours/day;Friend(s) Type of Home: Mobile home Home Access: Ramped entrance  Home Layout: One level Home Equipment: Agricultural consultant (2 wheels);Rollator (4 wheels);BSC/3in1;Grab bars - toilet;Grab bars - tub/shower;Wheelchair - Manufacturing systems engineer      Prior Function Prior Level of Function : Independent/Modified Independent              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, drives, 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; states he has been on 4 recently ADLs Comments: assisted with IADL by family; drives; does his own medication and financial managment     Hand Dominance   Dominant Hand: Right    Extremity/Trunk Assessment   Upper Extremity Assessment Upper Extremity Assessment: Generalized weakness (chronic shoulder flexion ROM deficits)    Lower Extremity Assessment Lower Extremity Assessment: Generalized weakness    Cervical / Trunk Assessment Cervical / Trunk Assessment: Kyphotic  Communication   Communication: HOH  Cognition Arousal/Alertness: Awake/alert Behavior During Therapy: WFL for tasks assessed/performed Overall Cognitive Status: No family/caregiver present to determine baseline cognitive functioning                                 General Comments: decreased awareness regarding safety and ability to DC home        General Comments General comments (skin integrity, edema, etc.): pt on 2L [place] at rest, sats in low 90s. Pt breifly desats with mobility however may be artifact when gripping walker. Pt does report significant DOE when mobilizing, fatiguing quickly.    Exercises     Assessment/Plan    PT Assessment Patient needs continued PT services  PT Problem List Decreased strength;Decreased activity tolerance;Decreased balance;Decreased mobility;Decreased knowledge of use of DME;Decreased safety awareness;Decreased knowledge of precautions;Cardiopulmonary status limiting activity       PT Treatment Interventions DME instruction;Gait training;Functional mobility training;Therapeutic activities;Therapeutic exercise;Balance training;Neuromuscular re-education;Cognitive remediation;Patient/family education    PT Goals (Current goals can be found in the Care Plan section)  Acute Rehab PT Goals Patient Stated Goal: to return home PT Goal  Formulation: With patient Time For Goal Achievement: 09/19/22 Potential to Achieve Goals: Fair    Frequency Min 3X/week     Co-evaluation PT/OT/SLP Co-Evaluation/Treatment: Yes Reason for Co-Treatment: Complexity of the patient's impairments (multi-system involvement);Necessary to address cognition/behavior during functional activity;For patient/therapist safety;To address functional/ADL transfers PT goals addressed during session: Mobility/safety with mobility;Balance;Proper use of DME;Strengthening/ROM OT goals addressed during session: ADL's and self-care;Strengthening/ROM       AM-PAC PT "6 Clicks" Mobility  Outcome Measure Help needed turning from your back to your side while in a flat bed without using bedrails?: A Lot Help needed moving from lying on your back to sitting on the side of a flat bed without using bedrails?: A Lot Help needed moving to and from a bed to a chair (including a wheelchair)?: A Lot Help needed standing up from a chair using your arms (e.g., wheelchair or bedside chair)?: A Lot Help needed to walk in hospital room?: Total Help needed climbing 3-5 steps with a railing? : Total 6 Click Score: 10    End of Session Equipment Utilized During Treatment: Gait belt;Oxygen Activity Tolerance: Patient limited by fatigue Patient left: in chair;with call bell/phone within reach;with chair alarm set Nurse Communication: Mobility status PT Visit Diagnosis: Other abnormalities of gait and mobility (R26.89);Muscle weakness (generalized) (M62.81)    Time: 1610-9604 PT Time Calculation (min) (ACUTE ONLY): 29 min   Charges:   PT Evaluation $PT Eval Low Complexity: 1 Low  Arlyss Gandy, PT, DPT Acute Rehabilitation Office [phone]   Arlyss Gandy 09/05/2022, 11:02 AM

## 2022-09-05 NOTE — Progress Notes (Signed)
Physical Therapy Treatment Patient Details Name: Jesus Rodriguez MRN: [id number] DOB: [birth year] Today's Date: 09/05/2022   History of Present Illness 72 y.o. male presents to [facility] hospital on 09/03/2022 as a transfer from [facility] with concern for SBO. Pt was initially hospitalized there with acute on chronic respiratory failure 2/2 CHF and PNA, pt required intubation, extubated 5/13. PMH includes afib, HTN, HLD, RA, scrotal cancer.    PT Comments    PT asked to assist nursing staff in transferring patient back to bed. Pt is a markedly more difficult transfer from lower surface, requiring significantly more assistance this session. Pt is unable to achieve standing from lower surface, assisted back to bed with 2 person squat pivot. Pt will benefit from continued acute PT services to strengthen BLE and to improve activity tolerance.   Recommendations for follow up therapy are one component of a multi-disciplinary discharge planning process, led by the attending physician.  Recommendations may be updated based on patient status, additional functional criteria and insurance authorization.  Follow Up Recommendations       Assistance Recommended at Discharge Frequent or constant Supervision/Assistance  Patient can return home with the following A lot of help with walking and/or transfers;A lot of help with bathing/dressing/bathroom;Assistance with cooking/housework;Assist for transportation;Help with stairs or ramp for entrance   Equipment Recommendations   (TBD)    Recommendations for Other Services Rehab consult     Precautions / Restrictions Precautions Precautions: Fall Precaution Comments: watch O2 sats Restrictions Weight Bearing Restrictions: No     Mobility  Bed Mobility Overal bed mobility: Needs Assistance Bed Mobility: Sit to Supine     Supine to sit: Mod assist, +2 for physical assistance, HOB elevated Sit to supine: Mod assist, +2 for physical assistance         Transfers Overall transfer level: Needs assistance Equipment used: Rolling walker (2 wheels), 2 person hand held assist Transfers: Sit to/from Stand, Bed to chair/wheelchair/BSC Sit to Stand:  (attempted x 2 to stand from low recliner seat with 2-3 person assist, unable to successfully do so with walker)   Step pivot transfers: Mod assist, +2 physical assistance, From elevated surface Squat pivot transfers: Max assist, +2 physical assistance     General transfer comment: PT provides knee block and support at gait belt. Heavy assist with facilitation of trunk flexion and anterior lean    Ambulation/Gait Ambulation/Gait assistance:  (unable to tolerate ambulation due to fatigue)                 Stairs             Wheelchair Mobility    Modified Rankin (Stroke Patients Only)       Balance Overall balance assessment: Needs assistance Sitting-balance support: No upper extremity supported, Feet supported Sitting balance-Leahy Scale: Fair     Standing balance support:  (unable to stand this session) Standing balance-Leahy Scale: Poor                              Cognition Arousal/Alertness: Awake/alert Behavior During Therapy: WFL for tasks assessed/performed Overall Cognitive Status: No family/caregiver present to determine baseline cognitive functioning                                 General Comments: decreased awareness regarding safety and ability to DC home        Exercises  General Comments General comments (skin integrity, edema, etc.): pt on 2LNC, reports DOE with mobility although sats in low 90s on 2L [place]      Pertinent Vitals/Pain Pain Assessment Pain Assessment: No/denies pain Pain Score: 6  Pain Location: L shoulder Pain Descriptors / Indicators: Aching, Discomfort Pain Intervention(s): Limited activity within patient's tolerance    Home Living Family/patient expects to be discharged to:: Private  residence Living Arrangements: Alone Available Help at Discharge: Available 24 hours/day;Friend(s) Type of Home: Mobile home Home Access: Ramped entrance       Home Layout: One level Home Equipment: Agricultural consultant (2 wheels);Rollator (4 wheels);BSC/3in1;Grab bars - toilet;Grab bars - tub/shower;Wheelchair - manual;Shower seat      Prior Function            PT Goals (current goals can now be found in the care plan section) Acute Rehab PT Goals Patient Stated Goal: to return home PT Goal Formulation: With patient Time For Goal Achievement: 09/19/22 Potential to Achieve Goals: Fair Progress towards PT goals: Not progressing toward goals - comment (limited by weakness)    Frequency    Min 3X/week      PT Plan Current plan remains appropriate    Co-evaluation PT/OT/SLP Co-Evaluation/Treatment: Yes Reason for Co-Treatment: Complexity of the patient's impairments (multi-system involvement);Necessary to address cognition/behavior during functional activity;For patient/therapist safety;To address functional/ADL transfers PT goals addressed during session: Mobility/safety with mobility;Balance;Proper use of DME;Strengthening/ROM OT goals addressed during session: ADL's and self-care;Strengthening/ROM      AM-PAC PT "6 Clicks" Mobility   Outcome Measure  Help needed turning from your back to your side while in a flat bed without using bedrails?: A Lot Help needed moving from lying on your back to sitting on the side of a flat bed without using bedrails?: A Lot Help needed moving to and from a bed to a chair (including a wheelchair)?: A Lot Help needed standing up from a chair using your arms (e.g., wheelchair or bedside chair)?: A Lot Help needed to walk in hospital room?: Total Help needed climbing 3-5 steps with a railing? : Total 6 Click Score: 10    End of Session Equipment Utilized During Treatment: Gait belt;Oxygen Activity Tolerance: Patient limited by  fatigue Patient left: in bed;with call bell/phone within reach;with bed alarm set;with nursing/sitter in room Nurse Communication: Mobility status PT Visit Diagnosis: Other abnormalities of gait and mobility (R26.89);Muscle weakness (generalized) (M62.81)     Time: 1610-9604 PT Time Calculation (min) (ACUTE ONLY): 10 min  Charges:  $Therapeutic Activity: 8-22 mins                     Arlyss Gandy, PT, DPT Acute Rehabilitation Office [phone]    Arlyss Gandy 09/05/2022, 12:11 PM

## 2022-09-05 NOTE — TOC CM/SW Note (Addendum)
Transition of Care [facility]) - Inpatient Brief Assessment   Patient Details  Name: Jesus Rodriguez MRN: [id number] Date of Birth: 27-Jan-1951  Transition of Care [facility]) CM/SW Contact:    Darrold Span, RN Phone Number: 09/05/2022, 2:46 PM   Clinical Narrative: Pt admitted w/ ileus- now on soft diet, per PT/OT evals recs for CIR- consult pending, pt has family support from home alone. May need SNF placement if not a candidate for CIR   Transition of Care Asessment: Insurance and Status: Insurance coverage has been reviewed Patient has primary care physician: Yes Home environment has been reviewed: home alone Prior level of function:: independent- but now needing assistance Prior/Current Home Services: No current home services   Readmission risk has been reviewed: Yes Transition of care needs: transition of care needs identified, TOC will continue to follow

## 2022-09-05 NOTE — Progress Notes (Signed)
Subjective: No complaints.  Wants to get up in his chair.  Wanting to go home.  Had 2 BMs yesterday.  No nausea with CLD  ROS: See above, otherwise other systems negative  Objective: Vital signs in last 24 hours: Temp:  [97.6 F (36.4 C)-98.4 F (36.9 C)] 97.7 F (36.5 C) (05/24 0750) Pulse Rate:  [60-116] 68 (05/24 0750) Resp:  [18-20] 18 (05/24 0750) BP: (92-117)/(61-94) 112/73 (05/24 0750) SpO2:  [94 %-99 %] 98 % (05/24 0750) Weight:  [134.9 kg] 134.9 kg (05/24 0712) Last BM Date : 09/03/22  Intake/Output from previous day: 05/23 0701 - 05/24 0700 In: 900 [P.O.:900] Out: -  Intake/Output this shift: No intake/output data recorded.  PE: Gen: NAD Abd: soft, obese, NT  Lab Results:  Recent Labs    09/03/22 0450 09/03/22 0945 09/05/22 0501  WBC 14.0*  --  9.7  HGB 12.9* 13.6 13.0  HCT 39.8 40.0 40.5  PLT 126*  --  98*   BMET Recent Labs    09/03/22 0450 09/03/22 0945 09/05/22 0501  NA 133* 135 134*  K 3.6 3.7 3.8  CL 90*  --  93*  CO2 36*  --  32  GLUCOSE 116*  --  95  BUN 16  --  13  CREATININE 0.54*  --  0.56*  CALCIUM 8.6*  --  8.7*   PT/INR No results for input(s): "LABPROT", "INR" in the last 72 hours. CMP     Component Value Date/Time   NA 134 (L) 09/05/2022 0501   NA 147 (H) 09/27/2021 1616   K 3.8 09/05/2022 0501   CL 93 (L) 09/05/2022 0501   CO2 32 09/05/2022 0501   GLUCOSE 95 09/05/2022 0501   BUN 13 09/05/2022 0501   BUN 18 09/27/2021 1616   CREATININE 0.56 (L) 09/05/2022 0501   CREATININE 0.66 (L) 10/07/2018 1356   CALCIUM 8.7 (L) 09/05/2022 0501   PROT 5.6 (L) 09/05/2022 0501   ALBUMIN 2.7 (L) 09/05/2022 0501   AST 38 09/05/2022 0501   ALT 99 (H) 09/05/2022 0501   ALKPHOS 69 09/05/2022 0501   BILITOT 2.1 (H) 09/05/2022 0501   GFRNONAA >60 09/05/2022 0501   GFRNONAA 99 10/07/2018 1356   GFRAA >60 11/30/2019 0412   GFRAA 115 10/07/2018 1356   Lipase     Component Value Date/Time   LIPASE 21 11/20/2020 1550        Studies/Results: DG Abd 1 View  Result Date: 09/04/2022 CLINICAL DATA:  Small-bowel obstruction EXAM: ABDOMEN - 1 VIEW COMPARISON:  09/03/2022 FINDINGS: Orogastric or nasogastric tube enters the stomach with the tip in the fundus. Previously administered contrast has passed almost entirely into the colon. Some persistent small bowel dilatation is noted. Findings could be consistent with resolving or partial small bowel obstruction. IMPRESSION: Previously administered contrast has passed almost entirely into the colon. Some persistent small bowel dilatation. Findings could be consistent with resolving or partial small bowel obstruction. Electronically Signed   By: Paulina Fusi M.D.   On: 09/04/2022 08:49   DG Abd Portable 1V-Small Bowel Obstruction Protocol-initial, 8 hr delay  Result Date: 09/03/2022 CLINICAL DATA:  8 hour delay. EXAM: PORTABLE ABDOMEN - 1 VIEW COMPARISON:  09/03/2022. FINDINGS: Distended loops of small bowel are noted in the abdomen measuring up to 5.1 cm. Contrast is noted in the small bowel in the pelvis. Air is seen in the visualized colon. An enteric tube terminates in the stomach. There are small bilateral pleural  effusions with atelectasis or infiltrate at the lung bases. The heart is enlarged. IMPRESSION: Distended loops of gas filled small bowel in the abdomen, not significantly changed from the prior exam. Contrast is seen in the small bowel in the pelvis. There is insufficient imaging of the right lower quadrant and ascending colon. Additional views are recommended. Electronically Signed   By: Thornell Sartorius M.D.   On: 09/03/2022 20:53   DG CHEST PORT 1 VIEW  Result Date: 09/03/2022 CLINICAL DATA:  Chest pain.  Hypoxia. EXAM: PORTABLE CHEST 1 VIEW COMPARISON:  Radiographs earlier the same date, 09/02/2022 and 10/03/2021. CT 08/29/2022. FINDINGS: 0947 hours. Enteric tube has been advanced, tip projecting over the gastric fundus. The side hole is not well seen on the  current study, but appears to be below the gastroesophageal junction. Right arm PICC appears unchanged near the superior cavoatrial junction. Unchanged cardiomegaly, left greater than right pleural effusions and associated left greater than right basilar airspace opacities. No evidence of pneumothorax. The bones appear unchanged. Telemetry leads overlie the chest. IMPRESSION: Interval advancement of the enteric tube, tip projecting over the gastric fundus. No other significant changes. Electronically Signed   By: Carey Bullocks M.D.   On: 09/03/2022 10:22    Anti-infectives: Anti-infectives (From admission, onward)    None        Assessment/Plan Ileus -SBO protocol with contrast in colon.  No evidence of obstruction -adv to soft diet -suspect bowel dysmotility from hospitalization and use of imodium for multiple days prior.  This should resolve on its own with time -mobilize, pulm toilet -no surgical needs at this time, he is surgically stable for DC home from our standpoint.  Relayed to primary service.  We will sign off at this time.  FEN - soft VTE - Lovenox ID - none needed  I reviewed hospitalist notes, last 24 h vitals and pain scores, last 48 h intake and output, last 24 h labs and trends, and last 24 h imaging results.   LOS: 2 days    Letha Cape , [facility] Surgery 09/05/2022, 9:50 AM Please see Amion for pager number during day hours 7:00am-4:30pm or 7:00am -11:30am on weekends

## 2022-09-06 DIAGNOSIS — K56609 Unspecified intestinal obstruction, unspecified as to partial versus complete obstruction: Secondary | ICD-10-CM | POA: Diagnosis not present

## 2022-09-06 LAB — CBC
HCT: 36.6 % — ABNORMAL LOW (ref 39.0–52.0)
Hemoglobin: 11.8 g/dL — ABNORMAL LOW (ref 13.0–17.0)
MCH: 29.5 pg (ref 26.0–34.0)
MCHC: 32.2 g/dL (ref 30.0–36.0)
MCV: 91.5 fL (ref 80.0–100.0)
Platelets: 90 10*3/uL — ABNORMAL LOW (ref 150–400)
RBC: 4 MIL/uL — ABNORMAL LOW (ref 4.22–5.81)
RDW: 13.8 % (ref 11.5–15.5)
WBC: 7.8 10*3/uL (ref 4.0–10.5)
nRBC: 0 % (ref 0.0–0.2)

## 2022-09-06 NOTE — Progress Notes (Signed)
PROGRESS NOTE    Jesus Rodriguez  WUJ:[id number] DOB: 24-Mar-1951 DOA: 09/03/2022 PCP: Gilmore Laroche, FNP   Brief Narrative: Jesus Rodriguez is a 72 y.o. male with a history of morbid obesity, COPD, chronic respiratory failure with hypoxia and hypercapnia, hypertension, diastolic heart failure. Patient was transferred from [facility] for surgical intervention of ongoing small bowel obstruction. Patient was initially hospitalized for respiratory failure requiring intubation and mechanical ventilation; successfully extubated on 5/13. Patient was treated for acute heart failure and pneumonia with diuresis and antibiotics respectively. Patient with prolonged NG tube requirement and started on TPN. General surgery evaluated and SBO protocol revealed no obstruction. NG tube removed on 5/23.  Diet advanced.   Assessment and Plan:  Small bowel obstruction Present on admission. Per chart review, it appears patient has been managed on NG suction and TPN for nutrition at outside hospital. Per outside records, patient was having bowel movements, but was still transferred to [facility] for surgical intervention. Abdomen is soft. SBO protocol initiated on 5/22 with contrast seen in the colon. NG tube removed 5/23. Diet advanced.  Chronic respiratory failure with hypoxia and hypercapnia Patient uses 4 L/min of oxygen at baseline. He is required to use CPAP, but from chart review, is not completely adherent with regimen. Possible acute respiratory failure at this time with patient presentation.  Patient with oxygen saturations at 100% at 4 L/min.  PT/OT recommending acute inpatient rehab.  Patient without help at home so we will attempt to discharge to skilled nursing facility. -Decrease oxygen rate with goal of 88 to 92% without worsened symptoms from baseline  Atrial fibrillation Typical atrial flutter Patient is s/p CTI ablation in 2017. Patient is followed by EP and has failed sotalol and  dofetilide (secondary to prolonged QTc). He is currently managed on metoprolol tartrate 100 mg BID which was held secondary to NPO status. -Continue home metoprolol -Continue to hold home Eliquis pending stabilization of thrombocytopenia  Chest pain Does not appear to be typical. Possibly related to hypoxia, as patient was found to have oxygen saturations in the 70s during his episode. EKG without specific ST-T segment changes. Chest pain improving after oxygen. Troponin low and flat at 15 > 15. Chest pain resolved.  Chronic diastolic heart failure Patient is euvolemic. Last LVEF of 55-60%. Patient is managed on torsemide as an outpatient. -Hold torsemide for now  Hyperlipidemia -Continue home Lipitor once able to take PO consistently  Primary hypertension Normotensive at this time.  Thrombocytopenia Slight worsening. Unclear etiology. No evidence of hemorrhaging at this time. -CBC in AM  OSA Per outside records, patient was managed with NIV as an outpatient but has been non-adherent. During hospitalization, he has been managed with BiPAP qHS. -Continue BiPAP qHS  Foley in place Present on admission from outside hospital -Strict input and output  Obesity Estimated body mass index is 38.18 kg/m as calculated from the following:   Height as of this encounter: 6\' 2"  (1.88 m).   Weight as of this encounter: 134.9 kg.    DVT prophylaxis: SCDs Code Status:   Code Status: Full Code Family Communication: Two sisters at at bedside Disposition Plan: Discharge to SNF pending bed availability.  Medically stable for discharge.   Consultants:  General surgery  Procedures:  None  Antimicrobials: None    Subjective: Patient reports no issues overnight. No abdominal pain.  Objective: BP 109/81 (BP Location: Left Arm)   Pulse 80   Temp 98 F (36.7 C) (Oral)   Resp  19   Ht 6\' 2"  (1.88 m)   Wt 134.9 kg   SpO2 100%   BMI 38.18 kg/m   Examination:  General exam:  Appears calm and comfortable Respiratory system: Clear to auscultation. Respiratory effort normal. Cardiovascular system: S1 & S2 heard, RRR. No murmurs. Gastrointestinal system: Abdomen is protuberant, soft and nontender. Normal bowel sounds heard. Central nervous system: Alert and oriented.  Musculoskeletal: No lower extremity edema. No calf tenderness Psychiatry: Judgement and insight appear normal. Mood & affect appropriate.    Data Reviewed: I have personally reviewed following labs and imaging studies  CBC Lab Results  Component Value Date   WBC 7.8 09/06/2022   RBC 4.00 (L) 09/06/2022   HGB 11.8 (L) 09/06/2022   HCT 36.6 (L) 09/06/2022   MCV 91.5 09/06/2022   MCH 29.5 09/06/2022   PLT 90 (L) 09/06/2022   MCHC 32.2 09/06/2022   RDW 13.8 09/06/2022   LYMPHSABS 0.9 10/03/2021   MONOABS 0.6 10/03/2021   EOSABS 0.3 10/03/2021   BASOSABS 0.1 10/03/2021     Last metabolic panel Lab Results  Component Value Date   NA 134 (L) 09/05/2022   K 3.8 09/05/2022   CL 93 (L) 09/05/2022   CO2 32 09/05/2022   BUN 13 09/05/2022   CREATININE 0.56 (L) 09/05/2022   GLUCOSE 95 09/05/2022   GFRNONAA >60 09/05/2022   GFRAA >60 11/30/2019   CALCIUM 8.7 (L) 09/05/2022   PHOS 2.7 09/03/2022   PROT 5.6 (L) 09/05/2022   ALBUMIN 2.7 (L) 09/05/2022   BILITOT 2.1 (H) 09/05/2022   ALKPHOS 69 09/05/2022   AST 38 09/05/2022   ALT 99 (H) 09/05/2022   ANIONGAP 9 09/05/2022    GFR: Estimated Creatinine Clearance: 122 mL/min (A) (by C-G formula based on SCr of 0.56 mg/dL (L)).  No results found for this or any previous visit (from the past 240 hour(s)).    Radiology Studies: No results found.    LOS: 3 days    Jacquelin Hawking, MD [facility] 09/06/2022, 3:32 PM   If 7PM-7AM, please contact night-coverage [email or web address]

## 2022-09-07 DIAGNOSIS — K56609 Unspecified intestinal obstruction, unspecified as to partial versus complete obstruction: Secondary | ICD-10-CM | POA: Diagnosis not present

## 2022-09-07 LAB — CBC
HCT: 37.4 % — ABNORMAL LOW (ref 39.0–52.0)
Hemoglobin: 11.9 g/dL — ABNORMAL LOW (ref 13.0–17.0)
MCH: 29.7 pg (ref 26.0–34.0)
MCHC: 31.8 g/dL (ref 30.0–36.0)
MCV: 93.3 fL (ref 80.0–100.0)
Platelets: 100 10*3/uL — ABNORMAL LOW (ref 150–400)
RBC: 4.01 MIL/uL — ABNORMAL LOW (ref 4.22–5.81)
RDW: 14 % (ref 11.5–15.5)
WBC: 8.6 10*3/uL (ref 4.0–10.5)
nRBC: 0 % (ref 0.0–0.2)

## 2022-09-07 MED ORDER — METOPROLOL TARTRATE 50 MG PO TABS: 75.0000 mg | ORAL_TABLET | Freq: Two times a day (BID) | ORAL | Status: DC

## 2022-09-07 MED ORDER — MELATONIN 3 MG PO TABS
3.0000 mg | ORAL_TABLET | Freq: Every day | ORAL | Status: DC
  Administered 2022-09-07 – 2022-09-09 (×3): 3 mg via ORAL
  Filled 2022-09-07 (×3): qty 1

## 2022-09-07 MED ORDER — METOPROLOL TARTRATE 50 MG PO TABS
75.0000 mg | ORAL_TABLET | Freq: Two times a day (BID) | ORAL | Status: DC
  Administered 2022-09-07 – 2022-09-10 (×6): 75 mg via ORAL
  Filled 2022-09-07 (×7): qty 1

## 2022-09-07 NOTE — NC FL2 (Signed)
[place] MEDICAID FL2 LEVEL OF CARE FORM     IDENTIFICATION  Patient Name: Jesus Rodriguez Birthdate: 1950/06/01 Sex: male Admission Date (Current Location): 09/03/2022  [facility] and [place] Number:  Producer, television/film/video and Address:  [facility] [place], [address], [place], [place] [address]      Provider Number: [id number]  Attending Physician Name and Address:  Narda Bonds, MD  Relative Name and Phone Number:       Current Level of Care: Hospital Recommended Level of Care: Skilled Nursing Facility Prior Approval Number:    Date Approved/Denied:   PASRR Number: [id number]  Discharge Plan: SNF    Current Diagnoses: Patient Active Problem List   Diagnosis Date Noted   Respiratory distress 09/30/2021   Acute on chronic diastolic CHF (congestive heart failure) (HCC) 09/17/2021   Acute on chronic respiratory failure with hypoxia and hypercarbia 09/17/2021   Mixed hyperlipidemia 09/17/2021   Hypotension 11/25/2020   AKI (acute kidney injury) (HCC) 11/24/2020   Chronic respiratory failure with hypoxia (HCC) 11/24/2020   Chronic diastolic CHF (congestive heart failure) (HCC) 11/24/2020   Tobacco use disorder 11/21/2020   Mild protein-calorie malnutrition (HCC) 11/21/2020   SBO (small bowel obstruction) (HCC) 11/20/2020   Obesity, Class III, BMI 40-49.9 (morbid obesity) (HCC)    COPD with acute exacerbation (HCC)    Acute on chronic diastolic HF (heart failure) (HCC)    Pressure injury of skin 11/28/2019   Acute respiratory failure with hypoxia (HCC) 11/27/2019   Atrial fibrillation (HCC) 05/11/2018   History of rheumatoid arthritis seronegative 09/10/2017   High risk medications (not anticoagulants) long-term use 02/14/2016   Class 3 obesity in adult 02/14/2016   Depression 02/14/2016   Osteoarthritis of both hands 02/13/2016   Osteoarthritis of both knees 02/13/2016   DDD (degenerative disc disease), lumbar 02/13/2016   Scrotal cancer (HCC)  02/13/2016   Hypertension 02/13/2016   Elevated cholesterol 02/13/2016   Insomnia 02/13/2016   Anxiety 02/13/2016   Sleep apnea 02/13/2016   Arrhythmia 02/13/2016    Orientation RESPIRATION BLADDER Height & Weight     Self, Time, Situation, Place  Normal, O2 (2L) Incontinent, External catheter Weight: 297 lb 6.4 oz (134.9 kg) Height:  6\' 2"  (188 cm)  BEHAVIORAL SYMPTOMS/MOOD NEUROLOGICAL BOWEL NUTRITION STATUS      Incontinent Diet (DIET SOFT Fluid consistency: Thin)  AMBULATORY STATUS COMMUNICATION OF NEEDS Skin   Extensive Assist Verbally Normal                       Personal Care Assistance Level of Assistance  Bathing, Feeding, Dressing Bathing Assistance: Maximum assistance Feeding assistance: Limited assistance Dressing Assistance: Maximum assistance     Functional Limitations Info  Sight, Hearing, Speech Sight Info: Adequate Hearing Info: Adequate Speech Info: Adequate    SPECIAL CARE FACTORS FREQUENCY  PT (By licensed PT), OT (By licensed OT)     PT Frequency: 5x a week OT Frequency: 5x a week            Contractures Contractures Info: Not present    Additional Factors Info  Code Status               Current Medications (09/07/2022):  This is the current hospital active medication list Current Facility-Administered Medications  Medication Dose Route Frequency Provider Last Rate Last Admin   acetaminophen (TYLENOL) tablet 650 mg  650 mg Oral Q6H PRN Narda Bonds, MD   650 mg at 09/07/22  1034   Or   acetaminophen (TYLENOL) suppository 650 mg  650 mg Rectal Q6H PRN Narda Bonds, MD       enoxaparin (LOVENOX) injection 40 mg  40 mg Subcutaneous Q24H Narda Bonds, MD   40 mg at 09/07/22 1034   melatonin tablet 3 mg  3 mg Oral QHS Narda Bonds, MD       metoprolol tartrate (LOPRESSOR) tablet 75 mg  75 mg Oral BID Narda Bonds, MD   75 mg at 09/07/22 1126   nicotine (NICODERM CQ - dosed in mg/24 hours) patch 14 mg  14 mg Transdermal  Daily Narda Bonds, MD   14 mg at 09/07/22 1037   polyethylene glycol (MIRALAX / GLYCOLAX) packet 17 g  17 g Oral Daily PRN Narda Bonds, MD   17 g at 09/05/22 1825     Discharge Medications: Please see discharge summary for a list of discharge medications.  Relevant Imaging Results:  Relevant Lab Results:   Additional Information SSN: [id number]  [place], [place]

## 2022-09-07 NOTE — Progress Notes (Signed)
PROGRESS NOTE    Jesus Rodriguez  ZOX:[id number] DOB: 30-Jul-1950 DOA: 09/03/2022 PCP: Gilmore Laroche, FNP   Brief Narrative: Jesus Rodriguez is a 72 y.o. male with a history of morbid obesity, COPD, chronic respiratory failure with hypoxia and hypercapnia, hypertension, diastolic heart failure. Patient was transferred from [facility] for surgical intervention of ongoing small bowel obstruction. Patient was initially hospitalized for respiratory failure requiring intubation and mechanical ventilation; successfully extubated on 5/13. Patient was treated for acute heart failure and pneumonia with diuresis and antibiotics respectively. Patient with prolonged NG tube requirement and started on TPN. General surgery evaluated and SBO protocol revealed no obstruction. NG tube removed on 5/23.  Diet advanced.   Assessment and Plan:  Small bowel obstruction Present on admission. Per chart review, it appears patient has been managed on NG suction and TPN for nutrition at outside hospital. Per outside records, patient was having bowel movements, but was still transferred to [facility] for surgical intervention. Abdomen is soft. SBO protocol initiated on 5/22 with contrast seen in the colon. NG tube removed 5/23. Diet advanced successfully.  Chronic respiratory failure with hypoxia and hypercapnia Patient uses 4 L/min of oxygen at baseline. He is required to use CPAP, but from chart review, is not completely adherent with regimen. Possible acute respiratory failure at this time with patient presentation.  Patient with oxygen saturations at 100% at 4 L/min.  PT/OT recommending acute inpatient rehab.  Patient without help at home so we will attempt to discharge to skilled nursing facility. -Decrease oxygen rate with goal of 88 to 92% without worsened symptoms from baseline -BiPAP nightly  Atrial fibrillation Typical atrial flutter Patient is s/p CTI ablation in 2017. Patient is followed by EP  and has failed sotalol and dofetilide (secondary to prolonged QTc). He is currently managed on metoprolol tartrate 100 mg BID which was held secondary to NPO status. -Continue home metoprolol -Continue to hold home Eliquis pending stabilization of thrombocytopenia  Chest pain Does not appear to be typical. Possibly related to hypoxia, as patient was found to have oxygen saturations in the 70s during his episode. EKG without specific ST-T segment changes. Chest pain improving after oxygen. Troponin low and flat at 15 > 15. Chest pain resolved.  Chronic diastolic heart failure Patient is euvolemic. Last LVEF of 55-60%. Patient is managed on torsemide as an outpatient. -Hold torsemide for now -Daily weights  Hyperlipidemia -Continue home Lipitor once able to take PO consistently  Primary hypertension Normotensive at this time.  Thrombocytopenia Slight worsening. Unclear etiology. No evidence of hemorrhaging at this time.  Platelets appear to have stabilized. -CBC in AM  OSA Per outside records, patient was managed with NIV as an outpatient but has been non-adherent. During hospitalization, he has been managed with BiPAP qHS. -Continue BiPAP qHS  Foley in place Present on admission from outside hospital -Strict input and output  Obesity Estimated body mass index is 38.18 kg/m as calculated from the following:   Height as of this encounter: 6\' 2"  (1.88 m).   Weight as of this encounter: 134.9 kg.    DVT prophylaxis: SCDs Code Status:   Code Status: Full Code Family Communication: Two sisters at at bedside Disposition Plan: Discharge to SNF pending bed availability.  Medically stable for discharge.   Consultants:  General surgery  Procedures:  None  Antimicrobials: None    Subjective: No concerns today.  Nursing states that patient did not sleep well overnight.  Objective: BP 95/71 (BP Location: Right  Arm)   Pulse 71   Temp 97.7 F (36.5 C) (Oral)   Resp 17    Ht 6\' 2"  (1.88 m)   Wt 134.9 kg   SpO2 93%   BMI 38.18 kg/m   Examination:  General exam: Appears calm and comfortable Respiratory system: Clear to auscultation. Respiratory effort normal. Cardiovascular system: S1 & S2 heard. Gastrointestinal system: Abdomen is nondistended, soft and nontender. Normal bowel sounds heard. Central nervous system: Alert and oriented. Musculoskeletal: No edema. No calf tenderness Psychiatry: Judgement and insight appear normal. Mood & affect appropriate.    Data Reviewed: I have personally reviewed following labs and imaging studies  CBC Lab Results  Component Value Date   WBC 8.6 09/07/2022   RBC 4.01 (L) 09/07/2022   HGB 11.9 (L) 09/07/2022   HCT 37.4 (L) 09/07/2022   MCV 93.3 09/07/2022   MCH 29.7 09/07/2022   PLT 100 (L) 09/07/2022   MCHC 31.8 09/07/2022   RDW 14.0 09/07/2022   LYMPHSABS 0.9 10/03/2021   MONOABS 0.6 10/03/2021   EOSABS 0.3 10/03/2021   BASOSABS 0.1 10/03/2021     Last metabolic panel Lab Results  Component Value Date   NA 134 (L) 09/05/2022   K 3.8 09/05/2022   CL 93 (L) 09/05/2022   CO2 32 09/05/2022   BUN 13 09/05/2022   CREATININE 0.56 (L) 09/05/2022   GLUCOSE 95 09/05/2022   GFRNONAA >60 09/05/2022   GFRAA >60 11/30/2019   CALCIUM 8.7 (L) 09/05/2022   PHOS 2.7 09/03/2022   PROT 5.6 (L) 09/05/2022   ALBUMIN 2.7 (L) 09/05/2022   BILITOT 2.1 (H) 09/05/2022   ALKPHOS 69 09/05/2022   AST 38 09/05/2022   ALT 99 (H) 09/05/2022   ANIONGAP 9 09/05/2022    GFR: Estimated Creatinine Clearance: 122 mL/min (A) (by C-G formula based on SCr of 0.56 mg/dL (L)).  No results found for this or any previous visit (from the past 240 hour(s)).    Radiology Studies: No results found.    LOS: 4 days    Jacquelin Hawking, MD [facility] 09/07/2022, 10:27 AM   If 7PM-7AM, please contact night-coverage [email or web address]

## 2022-09-07 NOTE — TOC Initial Note (Signed)
Transition of Care [facility]) - Initial/Assessment Note    Patient Details  Name: Jesus Rodriguez MRN: [id number] Date of Birth: [birth year]  Transition of Care [facility]) CM/SW Contact:    Jimmy Picket, LCSW Phone Number: 09/07/2022, 11:38 AM  Clinical Narrative:                  CSW received SNF consult. CSW met with pt at bedside. CSW introduced self and explained role at the hospital. Pt reports that PTA he was living at home alone.  CSW reviewed PT/OT recommendations for SNF. Pt reports he is ok going to SNF. Pt gave CSW permission to fax out to facilities in the area. Pt prefers to fo to SNF in [place], he has been before. CSW gave pt medicare.gov rating list to review. CSW explained insurance auth process.  CSW will continue to follow.   Expected Discharge Plan: Skilled Nursing Facility Barriers to Discharge: Continued Medical Work up, SNF Pending discharge summary   Patient Goals and CMS Choice   CMS Medicare.gov Compare Post Acute Care list provided to:: Patient Choice offered to / list presented to : Patient      Expected Discharge Plan and Services In-house Referral: Clinical Social Work Discharge Planning Services: CM Consult   Living arrangements for the past 2 months: Single Family Home                                      Prior Living Arrangements/Services Living arrangements for the past 2 months: Single Family Home Lives with:: Self Patient language and need for interpreter reviewed:: Yes Do you feel safe going back to the place where you live?: Yes        Care giver support system in place?: No (comment) Current home services: DME Criminal Activity/Legal Involvement Pertinent to Current Situation/Hospitalization: No - Comment as needed  Activities of Daily Living Home Assistive Devices/Equipment: Oxygen, Wheelchair ADL Screening (condition at time of admission) Patient's cognitive ability adequate to safely complete daily activities?: No Is the  patient deaf or have difficulty hearing?: No Does the patient have difficulty seeing, even when wearing glasses/contacts?: Yes Does the patient have difficulty concentrating, remembering, or making decisions?: Yes Patient able to express need for assistance with ADLs?: Yes Does the patient have difficulty dressing or bathing?: Yes Independently performs ADLs?: No Communication: Independent Dressing (OT): Needs assistance Grooming: Needs assistance Does the patient have difficulty walking or climbing stairs?: Yes Weakness of Legs: Both Weakness of Arms/Hands: None  Permission Sought/Granted Permission sought to share information with : Facility Industrial/product designer granted to share information with : Yes, Verbal Permission Granted              Emotional Assessment Appearance:: Appears stated age Attitude/Demeanor/Rapport: Engaged Affect (typically observed): Accepting, Appropriate Orientation: : Oriented to Self, Oriented to Place, Oriented to  Time, Oriented to Situation Alcohol / Substance Use: Not Applicable Psych Involvement: No (comment)  Admission diagnosis:  SBO (small bowel obstruction) (HCC) [K56.609] Patient Active Problem List   Diagnosis Date Noted   Respiratory distress 09/30/2021   Acute on chronic diastolic CHF (congestive heart failure) (HCC) 09/17/2021   Acute on chronic respiratory failure with hypoxia and hypercarbia 09/17/2021   Mixed hyperlipidemia 09/17/2021   Hypotension 11/25/2020   AKI (acute kidney injury) (HCC) 11/24/2020   Chronic respiratory failure with hypoxia (HCC) 11/24/2020   Chronic diastolic CHF (congestive heart failure) (HCC) 11/24/2020  Tobacco use disorder 11/21/2020   Mild protein-calorie malnutrition (HCC) 11/21/2020   SBO (small bowel obstruction) (HCC) 11/20/2020   Obesity, Class III, BMI 40-49.9 (morbid obesity) (HCC)    COPD with acute exacerbation (HCC)    Acute on chronic diastolic HF (heart failure) (HCC)     Pressure injury of skin 11/28/2019   Acute respiratory failure with hypoxia (HCC) 11/27/2019   Atrial fibrillation (HCC) 05/11/2018   History of rheumatoid arthritis seronegative 09/10/2017   High risk medications (not anticoagulants) long-term use 02/14/2016   Class 3 obesity in adult 02/14/2016   Depression 02/14/2016   Osteoarthritis of both hands 02/13/2016   Osteoarthritis of both knees 02/13/2016   DDD (degenerative disc disease), lumbar 02/13/2016   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 [place] [address] Phone: [phone] Fax: [phone]  [facility] [address] [place] [address] Phone: [phone] Fax: [phone]  [facility] [place], [place] - [address] [place] [address] Phone: [phone] Fax: [phone]     Social Determinants of Health (SDOH) Social History: SDOH Screenings   Food Insecurity: No Food Insecurity (09/03/2022)  Housing: Low Risk  (09/03/2022)  Transportation Needs: No Transportation Needs (09/03/2022)  Utilities: Not At Risk (09/03/2022)  Alcohol Screen: Low Risk  (11/05/2021)  Depression (PHQ2-9): Low Risk  (12/30/2021)  Financial Resource Strain: Low Risk  (11/05/2021)  Physical Activity: Inactive (11/05/2021)  Social Connections: Socially Isolated (11/05/2021)  Stress: No Stress Concern Present (11/05/2021)  Tobacco Use: Medium Risk (09/03/2022)   SDOH Interventions:     Readmission Risk Interventions    09/20/2021   12:37 PM 11/27/2020    1:12 PM  Readmission Risk Prevention Plan  Transportation Screening Complete Complete  PCP or Specialist Appt within 5-7 Days Not Complete Not Complete  Home Care  Screening Complete Complete  Medication Review (RN CM) Complete Complete

## 2022-09-07 NOTE — Progress Notes (Signed)
Inpatient Rehab Admissions Coordinator:    I spoke with pt. To discuss potential CIR admit. He states that he would prefer to go to [facility], where he has been before and is closer to home. I will let TOC know and will not pursue for admit.   Megan Salon, MS, CCC-SLP Rehab Admissions Coordinator  [phone] (celll) [phone] (office)

## 2022-09-08 DIAGNOSIS — K56609 Unspecified intestinal obstruction, unspecified as to partial versus complete obstruction: Secondary | ICD-10-CM | POA: Diagnosis not present

## 2022-09-08 LAB — CBC
HCT: 37.7 % — ABNORMAL LOW (ref 39.0–52.0)
Hemoglobin: 11.9 g/dL — ABNORMAL LOW (ref 13.0–17.0)
MCH: 29.8 pg (ref 26.0–34.0)
MCHC: 31.6 g/dL (ref 30.0–36.0)
MCV: 94.3 fL (ref 80.0–100.0)
Platelets: 102 10*3/uL — ABNORMAL LOW (ref 150–400)
RBC: 4 MIL/uL — ABNORMAL LOW (ref 4.22–5.81)
RDW: 14.3 % (ref 11.5–15.5)
WBC: 6.8 10*3/uL (ref 4.0–10.5)
nRBC: 0 % (ref 0.0–0.2)

## 2022-09-08 MED ORDER — HYDROCODONE-ACETAMINOPHEN 5-325 MG PO TABS
1.0000 | ORAL_TABLET | Freq: Four times a day (QID) | ORAL | Status: DC | PRN
  Administered 2022-09-08 – 2022-09-10 (×6): 1 via ORAL
  Filled 2022-09-08 (×6): qty 1

## 2022-09-08 MED ORDER — TORSEMIDE 20 MG PO TABS
60.0000 mg | ORAL_TABLET | Freq: Two times a day (BID) | ORAL | Status: DC
  Administered 2022-09-08 – 2022-09-10 (×4): 60 mg via ORAL
  Filled 2022-09-08 (×5): qty 3

## 2022-09-08 MED ORDER — ENSURE MAX PROTEIN PO LIQD
11.0000 [oz_av] | Freq: Every day | ORAL | Status: DC
  Administered 2022-09-08 – 2022-09-09 (×2): 11 [oz_av] via ORAL
  Filled 2022-09-08 (×4): qty 330

## 2022-09-08 NOTE — Progress Notes (Signed)
Occupational Therapy Treatment Patient Details Name: Jesus Rodriguez MRN: [id number] DOB: Sep 04, 1950 Today's Date: 09/08/2022   History of present illness 72 y.o. male presents to [facility] hospital on 09/03/2022 as a transfer from [facility] with concern for SBO. Pt was initially hospitalized there with acute on chronic respiratory failure 2/2 CHF and PNA, pt required intubation, extubated 5/13. PMH includes afib, HTN, HLD, RA, scrotal cancer.   OT comments  Pt progressing toward goals. Increased time for stand pivot transfers form bed with +2 mod A @ RW level. Uncontrolled descent as pt uses a lift chair at home. Increased WOB/DOE however SpO2 remains above 90 on 2L. Patient will benefit from continued inpatient follow up therapy, <3 hours/day. Acute OT to follow.    Recommendations for follow up therapy are one component of a multi-disciplinary discharge planning process, led by the attending physician.  Recommendations may be updated based on patient status, additional functional criteria and insurance authorization.    Assistance Recommended at Discharge Frequent or constant Supervision/Assistance  Patient can return home with the following  Two people to help with walking and/or transfers;Two people to help with bathing/dressing/bathroom;Assistance with cooking/housework;Assist for transportation;Help with stairs or ramp for entrance   Equipment Recommendations  None recommended by OT    Recommendations for Other Services      Precautions / Restrictions Precautions Precautions: Fall Precaution Comments: watch O2 sats       Mobility Bed Mobility               General bed mobility comments: sitting EOB with PT    Transfers Overall transfer level: Modified independent Equipment used: Rolling walker (2 wheels) Transfers: Sit to/from Stand, Bed to chair/wheelchair/BSC Sit to Stand: From elevated surface, +2 safety/equipment, Mod assist     Step pivot transfers: Min assist,  +2 physical assistance, +2 safety/equipment     General transfer comment: some lifting help and increased time to stand from elevated bed height, assist for safety to step to recliner with increased time using walker.     Balance Overall balance assessment: Needs assistance Sitting-balance support: Feet supported Sitting balance-Leahy Scale: Fair     Standing balance support: Bilateral upper extremity supported, Reliant on assistive device for balance Standing balance-Leahy Scale: Poor Standing balance comment: UE support and min A for balance                           ADL either performed or assessed with clinical judgement   ADL Overall ADL's : Needs assistance/impaired     Grooming: Set up   Upper Body Bathing: Set up   Lower Body Bathing: Moderate assistance;Sit to/from stand   Upper Body Dressing : Minimal assistance   Lower Body Dressing: Maximal assistance;Sit to/from stand   Toilet Transfer: Moderate assistance;+2 for physical assistance   Toileting- Clothing Manipulation and Hygiene: Total assistance Toileting - Clothing Manipulation Details (indicate cue type and reason): unable to release RW to wipe     Functional mobility during ADLs: Moderate assistance;Total assistance      Extremity/Trunk Assessment Upper Extremity Assessment Upper Extremity Assessment: Overall WFL for tasks assessed   Lower Extremity Assessment Lower Extremity Assessment: Defer to PT evaluation        Vision       Perception     Praxis      Cognition Arousal/Alertness: Awake/alert Behavior During Therapy: WFL for tasks assessed/performed Overall Cognitive Status: Within Functional Limits for tasks assessed  Exercises Exercises: General Upper Extremity General Exercises - Upper Extremity Shoulder Flexion: AROM, Strengthening, Both, 10 reps Elbow Flexion: Strengthening, Both, 10 reps Elbow Extension:  Strengthening, Both, 10 reps    Shoulder Instructions       General Comments on @L ; DOE however SpO2 inthe 90s    Pertinent Vitals/ Pain       Pain Assessment Pain Assessment: Faces Faces Pain Scale: Hurts little more Pain Location: L shoulder Pain Descriptors / Indicators: Aching, Discomfort Pain Intervention(s): Limited activity within patient's tolerance  Home Living                                          Prior Functioning/Environment              Frequency  Min 2X/week        Progress Toward Goals  OT Goals(current goals can now be found in the care plan section)  Progress towards OT goals: Progressing toward goals  Acute Rehab OT Goals Patient Stated Goal: to go to rehab in [place] OT Goal Formulation: With patient Time For Goal Achievement: 09/19/22 Potential to Achieve Goals: Good ADL Goals Pt Will Perform Lower Body Bathing: sit to/from stand;with adaptive equipment;with min assist Pt Will Perform Lower Body Dressing: with min assist;with adaptive equipment;sit to/from stand Pt Will Transfer to Toilet: with min assist;ambulating;bedside commode Pt Will Perform Toileting - Clothing Manipulation and hygiene: with min assist;sit to/from stand;sitting/lateral leans Additional ADL Goal #1: Pt will independently verbalize 3 energy conservation strategies to use for ADL tasks  Plan Discharge plan needs to be updated    Co-evaluation    PT/OT/SLP Co-Evaluation/Treatment: Yes (partial session) Reason for Co-Treatment: Complexity of the patient's impairments (multi-system involvement);Necessary to address cognition/behavior during functional activity;For patient/therapist safety;To address functional/ADL transfers PT goals addressed during session: Mobility/safety with mobility;Balance;Proper use of DME;Strengthening/ROM OT goals addressed during session: ADL's and self-care;Strengthening/ROM      AM-PAC OT "6 Clicks" Daily Activity      Outcome Measure   Help from another person eating meals?: None Help from another person taking care of personal grooming?: A Little Help from another person toileting, which includes using toliet, bedpan, or urinal?: Total Help from another person bathing (including washing, rinsing, drying)?: A Lot Help from another person to put on and taking off regular upper body clothing?: A Little Help from another person to put on and taking off regular lower body clothing?: A Lot 6 Click Score: 15    End of Session Equipment Utilized During Treatment: Gait belt;Rolling walker (2 wheels);Oxygen (2L)  OT Visit Diagnosis: Unsteadiness on feet (R26.81);Other abnormalities of gait and mobility (R26.89);Muscle weakness (generalized) (M62.81)   Activity Tolerance Patient tolerated treatment well   Patient Left in chair;with call bell/phone within reach;with chair alarm set;with family/visitor present   Nurse Communication Mobility status        Time: 6213-0865 OT Time Calculation (min): 23 min  Charges: OT General Charges $OT Visit: 1 Visit OT Treatments $Self Care/Home Management : 8-22 mins  Luisa Dago, OT/L   Acute OT Clinical Specialist Acute Rehabilitation Services Pager [phone] Office [phone]   [facility] 09/08/2022, 3:29 PM

## 2022-09-08 NOTE — Progress Notes (Signed)
Pt transferring to 5W and report given to receiving nurse Devin on 5W. According to receiving unit charge, unit is ordering a bariatric bed for pt and pt will be transferred once bariatric bed is delivered to the receiving unit. Dionne Bucy RN

## 2022-09-08 NOTE — Progress Notes (Signed)
Mobility Specialist Progress Note   09/08/22 1800  Mobility  Activity Transferred from chair to bed  Level of Assistance +2 (takes two people) (mod-max)  Assistive Device Stedy  Activity Response Tolerated fair  Mobility Referral Yes  $Mobility charge 1 Mobility  Mobility Specialist Start Time (ACUTE ONLY) 1800  Mobility Specialist Stop Time (ACUTE ONLY) 1825  Mobility Specialist Time Calculation (min) (ACUTE ONLY) 25 min   Pt requesting to get back to bed from chair. +2A (max - mod) to initiate stand, once upright pt had a BM and required +2A minA to stand for pericare then placed at EOB w/ limited control on decent. Let at EOB for dinner w/ call bell in reach and RN present in room.    Frederico Hamman Mobility Specialist Please contact via SecureChat or  Rehab office at [phone]

## 2022-09-08 NOTE — Progress Notes (Signed)
Physical Therapy Treatment Patient Details Name: Jesus Rodriguez MRN: [id number] DOB: Jan 07, 1951 Today's Date: 09/08/2022   History of Present Illness 72 y.o. male presents to [facility] on 09/03/2022 as a transfer from [facility] with concern for SBO. Pt was initially hospitalized there with acute on chronic respiratory failure 2/2 CHF and PNA, pt required intubation, extubated 5/13. PMH includes afib, HTN, HLD, RA, scrotal cancer.    PT Comments    Patient progressing with LE strength with less buckling or knee blocking needed with stepping to recliner.  Still needing mod A of 2 from elevated bed to stand to RW and cues for hand placement.  Patient with SpO2 maintained in 90's on 2L O2 with mobility today though cues needed for breathing during mobility.  Also initiated incentive spirometer today pt pulling up to .  Patient plans to d/c to SNF at [facility] in [place].  PT will continue to follow.    Recommendations for follow up therapy are one component of a multi-disciplinary discharge planning process, led by the attending physician.  Recommendations may be updated based on patient status, additional functional criteria and insurance authorization.  Follow Up Recommendations  Can patient physically be transported by private vehicle: No    Assistance Recommended at Discharge Frequent or constant Supervision/Assistance  Patient can return home with the following A lot of help with walking and/or transfers;A lot of help with bathing/dressing/bathroom;Assistance with cooking/housework;Assist for transportation;Help with stairs or ramp for entrance   Equipment Recommendations  Other (comment) (TBA)    Recommendations for Other Services       Precautions / Restrictions Precautions Precautions: Fall Precaution Comments: watch O2 sats     Mobility  Bed Mobility Overal bed mobility: Needs Assistance Bed Mobility: Supine to Sit     Supine to sit: Mod assist, HOB elevated      General bed mobility comments: lifting help for trunk and to scoot hips to EOB    Transfers Overall transfer level: Needs assistance Equipment used: Rolling walker (2 wheels) Transfers: Sit to/from Stand, Bed to chair/wheelchair/BSC Sit to Stand: From elevated surface, +2 safety/equipment, Mod assist   Step pivot transfers: Min assist, +2 physical assistance, +2 safety/equipment       General transfer comment: some lifting help and increased time to stand from elevated bed height, assist for safety to step to recliner with increased time using walker.    Ambulation/Gait               General Gait Details: unable to tolerate ambulation due to SOB, LE weakness/fatigue   Stairs             Wheelchair Mobility    Modified Rankin (Stroke Patients Only)       Balance Overall balance assessment: Needs assistance Sitting-balance support: Feet supported Sitting balance-Leahy Scale: Fair     Standing balance support: Bilateral upper extremity supported, Reliant on assistive device for balance Standing balance-Leahy Scale: Poor Standing balance comment: UE support and min A for balance                            Cognition Arousal/Alertness: Awake/alert Behavior During Therapy: WFL for tasks assessed/performed Overall Cognitive Status: Within Functional Limits for tasks assessed  Exercises Other Exercises Other Exercises: incentive spirometer x ~8 reps up to about , cues for technique    General Comments General comments (skin integrity, edema, etc.): on 2L O2 with cues for breathing during mobility. Sats in 90's with mobility though pt reports headache.      Pertinent Vitals/Pain Pain Assessment Pain Assessment: Faces Faces Pain Scale: No hurt    Home Living                          Prior Function            PT Goals (current goals can now be found in the care  plan section) Progress towards PT goals: Progressing toward goals    Frequency    Min 3X/week      PT Plan Discharge plan needs to be updated    Co-evaluation PT/OT/SLP Co-Evaluation/Treatment: Yes Reason for Co-Treatment: Complexity of the patient's impairments (multi-system involvement);Necessary to address cognition/behavior during functional activity;For patient/therapist safety;To address functional/ADL transfers PT goals addressed during session: Mobility/safety with mobility;Balance;Proper use of DME;Strengthening/ROM        AM-PAC PT "6 Clicks" Mobility   Outcome Measure  Help needed turning from your back to your side while in a flat bed without using bedrails?: A Lot Help needed moving from lying on your back to sitting on the side of a flat bed without using bedrails?: A Lot Help needed moving to and from a bed to a chair (including a wheelchair)?: Total Help needed standing up from a chair using your arms (e.g., wheelchair or bedside chair)?: Total Help needed to walk in hospital room?: Total Help needed climbing 3-5 steps with a railing? : Total 6 Click Score: 8    End of Session Equipment Utilized During Treatment: Gait belt;Oxygen Activity Tolerance: Patient limited by fatigue Patient left: in chair;with chair alarm set;with call bell/phone within reach;with family/visitor present Nurse Communication: Mobility status PT Visit Diagnosis: Other abnormalities of gait and mobility (R26.89);Muscle weakness (generalized) (M62.81)     Time: 1610-9604 PT Time Calculation (min) (ACUTE ONLY): 30 min  Charges:  $Therapeutic Activity: 8-22 mins                     Sheran Lawless, PT Acute Rehabilitation Services Office:[phone] 09/08/2022    Jesus Rodriguez 09/08/2022, 2:36 PM

## 2022-09-08 NOTE — Progress Notes (Signed)
RN offered to transfer pt from chair to bed in other to transport to new unit but pt refused and insisted on transferring via recliner chair. Pt transported off unit via recliner chair on 2 L oxygen [employer] with belongings to the side including CPAP. Pt transferred to 5W19. Chair alarm on, call light and phone within reach reach. Receiving RN in room with pt. Dionne Bucy MSN, RN

## 2022-09-08 NOTE — Progress Notes (Signed)
Placed patient on bipap for the night with oxygen set at 2lpm  

## 2022-09-08 NOTE — Progress Notes (Signed)
PROGRESS NOTE    Jesus Rodriguez  ZOX:[id number] DOB: [birth year] DOA: 09/03/2022 PCP: Gilmore Laroche, FNP   Brief Narrative: Jesus Rodriguez is a 72 y.o. male with a history of morbid obesity, COPD, chronic respiratory failure with hypoxia and hypercapnia, hypertension, diastolic heart failure. Patient was transferred from [facility] for surgical intervention of ongoing small bowel obstruction. Patient was initially hospitalized for respiratory failure requiring intubation and mechanical ventilation; successfully extubated on 5/13. Patient was treated for acute heart failure and pneumonia with diuresis and antibiotics respectively. Patient with prolonged NG tube requirement and started on TPN. General surgery evaluated and SBO protocol revealed no obstruction. NG tube removed on 5/23.  Diet advanced.   Assessment and Plan:  Small bowel obstruction Present on admission. Per chart review, it appears patient has been managed on NG suction and TPN for nutrition at outside hospital. Per outside records, patient was having bowel movements, but was still transferred to [facility] for surgical intervention. Abdomen is soft. SBO protocol initiated on 5/22 with contrast seen in the colon. NG tube removed 5/23. Diet advanced successfully. SBO resolved.  Chronic respiratory failure with hypoxia and hypercapnia Patient uses 4 L/min of oxygen at baseline. He is required to use CPAP, but from chart review, is not completely adherent with regimen. Possible acute respiratory failure at this time with patient presentation.  Patient with oxygen saturations at 100% at 4 L/min.  PT/OT recommending acute inpatient rehab.  Patient without help at home so we will attempt to discharge to skilled nursing facility. -Decrease oxygen rate with goal of 88 to 92% without worsened symptoms from baseline -BiPAP nightly  Atrial fibrillation Typical atrial flutter Patient is s/p CTI ablation in 2017. Patient is  followed by EP and has failed sotalol and dofetilide (secondary to prolonged QTc). He is currently managed on metoprolol tartrate 100 mg BID which was held secondary to NPO status. -Continue home metoprolol (dosed reduced to 75 mg BID for hypotension) -Continue to hold home Eliquis pending stabilization of thrombocytopenia; if platelets continue to trend up, restart Eliquis in AM  Chest pain Does not appear to be typical. Possibly related to hypoxia, as patient was found to have oxygen saturations in the 70s during his episode. EKG without specific ST-T segment changes. Chest pain improving after oxygen. Troponin low and flat at 15 > 15. Chest pain resolved.  Chronic diastolic heart failure Patient is euvolemic. Last LVEF of 55-60%. Patient is managed on torsemide as an outpatient. -Resume torsemide -Daily weights  Hyperlipidemia -Continue home Lipitor once able to take PO consistently  Primary hypertension Normotensive at this time.  Thrombocytopenia Slight worsening. Unclear etiology. No evidence of hemorrhaging at this time.  Platelets appear to have stabilized. -CBC in AM  OSA Per outside records, patient was managed with NIV as an outpatient but has been non-adherent. During hospitalization, he has been managed with BiPAP qHS. -Continue BiPAP qHS  Foley in place Present on admission from outside hospital -Strict input and output  Obesity Estimated body mass index is 38.18 kg/m as calculated from the following:   Height as of this encounter: 6\' 2"  (1.88 m).   Weight as of this encounter: 134.9 kg.    DVT prophylaxis: SCDs Code Status:   Code Status: Full Code Family Communication: None at bedside Disposition Plan: Discharge to SNF pending bed availability.  Medically stable for discharge.   Consultants:  General surgery  Procedures:  None  Antimicrobials: None    Subjective: Back pain. No other  concerns.  Objective: BP 102/61 (BP Location: Left Arm)    Pulse 61   Temp (!) 97.3 F (36.3 C) (Axillary)   Resp 19   Ht 6\' 2"  (1.88 m)   Wt 134.9 kg   SpO2 95%   BMI 38.18 kg/m   Examination:  General exam: Appears calm and comfortable Respiratory system: Respiratory effort normal. Cardiovascular system: S1 & S2 heard, RRR.  Gastrointestinal system: Abdomen is nondistended, soft and nontender. Normal bowel sounds heard. Central nervous system: Alert and oriented. Psychiatry: Judgement and insight appear normal. Mood & affect appropriate.    Data Reviewed: I have personally reviewed following labs and imaging studies  CBC Lab Results  Component Value Date   WBC 6.8 09/08/2022   RBC 4.00 (L) 09/08/2022   HGB 11.9 (L) 09/08/2022   HCT 37.7 (L) 09/08/2022   MCV 94.3 09/08/2022   MCH 29.8 09/08/2022   PLT 102 (L) 09/08/2022   MCHC 31.6 09/08/2022   RDW 14.3 09/08/2022   LYMPHSABS 0.9 10/03/2021   MONOABS 0.6 10/03/2021   EOSABS 0.3 10/03/2021   BASOSABS 0.1 10/03/2021     Last metabolic panel Lab Results  Component Value Date   NA 134 (L) 09/05/2022   K 3.8 09/05/2022   CL 93 (L) 09/05/2022   CO2 32 09/05/2022   BUN 13 09/05/2022   CREATININE 0.56 (L) 09/05/2022   GLUCOSE 95 09/05/2022   GFRNONAA >60 09/05/2022   GFRAA >60 11/30/2019   CALCIUM 8.7 (L) 09/05/2022   PHOS 2.7 09/03/2022   PROT 5.6 (L) 09/05/2022   ALBUMIN 2.7 (L) 09/05/2022   BILITOT 2.1 (H) 09/05/2022   ALKPHOS 69 09/05/2022   AST 38 09/05/2022   ALT 99 (H) 09/05/2022   ANIONGAP 9 09/05/2022    GFR: Estimated Creatinine Clearance: 122 mL/min (A) (by C-G formula based on SCr of 0.56 mg/dL (L)).  No results found for this or any previous visit (from the past 240 hour(s)).    Radiology Studies: No results found.    LOS: 5 days    Jacquelin Hawking, MD [facility] 09/08/2022, 12:30 PM   If 7PM-7AM, please contact night-coverage [email or web address]

## 2022-09-08 NOTE — Plan of Care (Signed)
Nutrition Education Note  RD assessing pt for MST 2 or greater, upon visit pt with questions on how to "keep the fluid off." Noted pt with hx of CHF, HLD, HTN  RD provided "Heart Failure Nutrition Therapy" handout from the Academy of Nutrition and Dietetics. RD also provided pt handouts on eating a heart healthy diet on a budget and the "southern heart healthy diet." Reviewed patient's dietary recall. Pt reports he lives at home and his friend and his sister cook for him, sister also does grocery shopping. Pt reports half of the time he eats at home, other times he eats out. Pt reports he does not use a salt shaker but does consume foods that are generally high in sodium. Provided examples on ways to decrease sodium intake in diet including purchasing "No Added Salt or Low Sodium" versions of processed foods.  Encouraged pt to limit eating out and make "best" choices possible with regards to sodium when eating out.  Encouraged fresh fruits and vegetables as well as whole grain sources of carbohydrates to maximize fiber intake.   Body mass index is 38.18 kg/m. Pt meets criteria for obesity based on current BMI.  Current diet order is SOFT, patient is consuming approximately 90-100% of meals at this time. Pt reports good appetite, SBO has resolved with medical management. Add Ensure Max at bedtime. Labs and medications reviewed. No further nutrition interventions warranted at this time.    Romelle Starcher MS, RDN, LDN, CNSC Registered Dietitian 3 Clinical Nutrition RD Pager and On-Call Pager Number Located in [place]

## 2022-09-09 DIAGNOSIS — J42 Unspecified chronic bronchitis: Secondary | ICD-10-CM | POA: Diagnosis not present

## 2022-09-09 DIAGNOSIS — I480 Paroxysmal atrial fibrillation: Secondary | ICD-10-CM | POA: Diagnosis not present

## 2022-09-09 DIAGNOSIS — G4733 Obstructive sleep apnea (adult) (pediatric): Secondary | ICD-10-CM | POA: Diagnosis not present

## 2022-09-09 DIAGNOSIS — K56609 Unspecified intestinal obstruction, unspecified as to partial versus complete obstruction: Secondary | ICD-10-CM | POA: Diagnosis not present

## 2022-09-09 LAB — CBC
HCT: 33.1 % — ABNORMAL LOW (ref 39.0–52.0)
Hemoglobin: 10.7 g/dL — ABNORMAL LOW (ref 13.0–17.0)
MCH: 29.2 pg (ref 26.0–34.0)
MCHC: 32.3 g/dL (ref 30.0–36.0)
MCV: 90.4 fL (ref 80.0–100.0)
Platelets: 120 10*3/uL — ABNORMAL LOW (ref 150–400)
RBC: 3.66 MIL/uL — ABNORMAL LOW (ref 4.22–5.81)
RDW: 14.5 % (ref 11.5–15.5)
WBC: 9.5 10*3/uL (ref 4.0–10.5)
nRBC: 0 % (ref 0.0–0.2)

## 2022-09-09 MED ORDER — ARFORMOTEROL TARTRATE 15 MCG/2ML IN NEBU
15.0000 ug | INHALATION_SOLUTION | Freq: Two times a day (BID) | RESPIRATORY_TRACT | Status: DC
  Administered 2022-09-09 – 2022-09-10 (×3): 15 ug via RESPIRATORY_TRACT
  Filled 2022-09-09 (×3): qty 2

## 2022-09-09 MED ORDER — APIXABAN 5 MG PO TABS
5.0000 mg | ORAL_TABLET | Freq: Two times a day (BID) | ORAL | Status: DC
  Administered 2022-09-09 – 2022-09-10 (×3): 5 mg via ORAL
  Filled 2022-09-09 (×3): qty 1

## 2022-09-09 MED ORDER — ATORVASTATIN CALCIUM 10 MG PO TABS
10.0000 mg | ORAL_TABLET | Freq: Every day | ORAL | Status: DC
  Administered 2022-09-09 – 2022-09-10 (×2): 10 mg via ORAL
  Filled 2022-09-09 (×2): qty 1

## 2022-09-09 MED ORDER — BUDESONIDE 0.25 MG/2ML IN SUSP
0.2500 mg | Freq: Two times a day (BID) | RESPIRATORY_TRACT | Status: DC
  Administered 2022-09-09 – 2022-09-10 (×3): 0.25 mg via RESPIRATORY_TRACT
  Filled 2022-09-09 (×3): qty 2

## 2022-09-09 MED ORDER — IPRATROPIUM-ALBUTEROL 0.5-2.5 (3) MG/3ML IN SOLN: 3.0000 mL | Freq: Four times a day (QID) | RESPIRATORY_TRACT | Status: DC | PRN

## 2022-09-09 MED ORDER — REVEFENACIN 175 MCG/3ML IN SOLN
175.0000 ug | Freq: Every day | RESPIRATORY_TRACT | Status: DC
  Administered 2022-09-09 – 2022-09-10 (×2): 175 ug via RESPIRATORY_TRACT
  Filled 2022-09-09 (×2): qty 3

## 2022-09-09 MED ORDER — TAMSULOSIN HCL 0.4 MG PO CAPS
0.4000 mg | ORAL_CAPSULE | Freq: Every day | ORAL | Status: DC
  Administered 2022-09-09: 0.4 mg via ORAL
  Filled 2022-09-09: qty 1

## 2022-09-09 NOTE — Care Management Important Message (Signed)
Important Message  Patient Details  Name: Jesus Rodriguez MRN: [id number] Date of Birth: 1950-07-26   Medicare Important Message Given:  Yes     Kriston Mckinnie Stefan Church 09/09/2022, 3:49 PM

## 2022-09-09 NOTE — TOC Progression Note (Signed)
Transition of Care [facility]) - Progression Note    Patient Details  Name: Jesus Rodriguez MRN: [id number] Date of Birth: [birth year]  Transition of Care [facility] CM/SW Contact  [name], LCSW Phone Number: 09/09/2022, 4:35 PM  Clinical Narrative:    CSW met with patient and provided SNF bed offers [facility] unable to offer bed). Patient prefers [facility] and is open to them visiting him in the morning. CSW will begin insurance process pending facility choice.    Expected Discharge Plan: Skilled Nursing Facility Barriers to Discharge: Continued Medical Work up, SNF Pending discharge summary  Expected Discharge Plan and Services In-house Referral: Clinical Social Work Discharge Planning Services: CM Consult   Living arrangements for the past 2 months: Single Family Home                                       Social Determinants of Health (SDOH) Interventions SDOH Screenings   Food Insecurity: No Food Insecurity (09/03/2022)  Housing: Low Risk  (09/03/2022)  Transportation Needs: No Transportation Needs (09/03/2022)  Utilities: Not At Risk (09/03/2022)  Alcohol Screen: Low Risk  (11/05/2021)  Depression (PHQ2-9): Low Risk  (12/30/2021)  Financial Resource Strain: Low Risk  (11/05/2021)  Physical Activity: Inactive (11/05/2021)  Social Connections: Socially Isolated (11/05/2021)  Stress: No Stress Concern Present (11/05/2021)  Tobacco Use: Medium Risk (09/03/2022)    Readmission Risk Interventions    09/20/2021   12:37 PM 11/27/2020    1:12 PM  Readmission Risk Prevention Plan  Transportation Screening Complete Complete  PCP or Specialist Appt within 5-7 Days Not Complete Not Complete  Home Care Screening Complete Complete  Medication Review (RN CM) Complete Complete

## 2022-09-09 NOTE — TOC Progression Note (Addendum)
Transition of Care [facility]) - Progression Note    Patient Details  Name: Jesus Rodriguez MRN: [id number] Date of Birth: 1950/05/18  Transition of Care [facility]) CM/SW Contact  [name], LCSW Phone Number: 09/09/2022, 10:00 AM  Clinical Narrative:    10am-CSW requested [facility] review patient.   1pm-[facility] admissions will need to come see patient in person tomorrow morning.    Expected Discharge Plan: Skilled Nursing Facility Barriers to Discharge: Continued Medical Work up, SNF Pending discharge summary  Expected Discharge Plan and Services In-house Referral: Clinical Social Work Discharge Planning Services: CM Consult   Living arrangements for the past 2 months: Single Family Home                                       Social Determinants of Health (SDOH) Interventions SDOH Screenings   Food Insecurity: No Food Insecurity (09/03/2022)  Housing: Low Risk  (09/03/2022)  Transportation Needs: No Transportation Needs (09/03/2022)  Utilities: Not At Risk (09/03/2022)  Alcohol Screen: Low Risk  (11/05/2021)  Depression (PHQ2-9): Low Risk  (12/30/2021)  Financial Resource Strain: Low Risk  (11/05/2021)  Physical Activity: Inactive (11/05/2021)  Social Connections: Socially Isolated (11/05/2021)  Stress: No Stress Concern Present (11/05/2021)  Tobacco Use: Medium Risk (09/03/2022)    Readmission Risk Interventions    09/20/2021   12:37 PM 11/27/2020    1:12 PM  Readmission Risk Prevention Plan  Transportation Screening Complete Complete  PCP or Specialist Appt within 5-7 Days Not Complete Not Complete  Home Care Screening Complete Complete  Medication Review (RN CM) Complete Complete

## 2022-09-09 NOTE — Progress Notes (Signed)
PROGRESS NOTE        PATIENT DETAILS Name: Jesus Rodriguez Age: 72 y.o. Sex: male Date of Birth: 01-02-1951 Admit Date: 09/03/2022 Admitting Physician Darlin Drop, DO GMW:[id number], Malachi Bonds, FNP  Brief Summary: Patient is a 72 y.o.  male with history of COPD on home O2 4 L/m, OSA on CPAP, chronic HFpEF, HTN who initially presented to [facility] with shortness of breath-he was found to have worsening hypoxemia/hypercarbia due to HFpEF exacerbation-he was intubated- diuresed and successfully extubated on 5/13, further hospital course was complicated by SBO-and was subsequently transferred to [facility] for further evaluation.  He was evaluated by general surgery at [facility] SBO protocol which did not reveal any obstruction, NG tube was removed on 5/23-diet advanced-currently he is stable and awaiting SNF bed.  See below for further details.  Consults: General surgery  Subjective: Lying comfortably in bed-denies any chest pain or shortness of breath.  No major complaints this morning.  Claims that he is awaiting SNF bed.  Objective: Vitals: Blood pressure (!) 94/57, pulse 82, temperature (!) 97.4 F (36.3 C), temperature source Oral, resp. rate 16, height 6\' 2"  (1.88 m), weight (!) 141 kg, SpO2 100 %.   Exam: Gen Exam:Alert awake-not in any distress HEENT:atraumatic, normocephalic Chest: B/L clear to auscultation anteriorly CVS:S1S2 regular Abdomen:soft non tender, non distended Extremities: Chronic venous stasis changes bilaterally Neurology: Non focal Skin: no rash  Pertinent Labs/Radiology:    Latest Ref Rng & Units 09/09/2022    2:12 AM 09/08/2022    7:14 AM 09/07/2022    3:51 AM  CBC  WBC 4.0 - 10.5 K/uL 9.5  6.8  8.6   Hemoglobin 13.0 - 17.0 g/dL 66.4  40.3  47.4   Hematocrit 39.0 - 52.0 % 33.1  37.7  37.4   Platelets 150 - 400 K/uL 120  102  100     Lab Results  Component Value Date   NA 134 (L) 09/05/2022   K 3.8 09/05/2022   CL 93 (L) 09/05/2022    CO2 32 09/05/2022      Assessment/Plan: SBO Resolved with supportive care Tolerating advancement diet Having almost daily bowel movements.  COPD with chronic hypoxic respiratory failure on 4 L of oxygen at home Stable-not in exacerbation Continue bronchodilators Continue O2-2-4 L to maintain O2 saturations above 88-90%.  Chronic HFpEF Euvolemic Torsemide Follow volume status/weight/electrolytes  Chronic atrial flutter Rate controlled with metoprolol Resume Eliquis  Thrombocytopenia Unclear etiology Improving Follow CBC periodically  Normocytic anemia Secondary to critical illness Follow CBC  HLD Resume Lipitor  OSA BiPAP nightly  Debility/conditioning Secondary to acute illness PT/OT eval-SNF recommended.  Obesity/: Estimated body mass index is 39.9 kg/m as calculated from the following:   Height as of this encounter: 6\' 2"  (1.88 m).   Weight as of this encounter: 141 kg.   Code status:   Code Status: Full Code   DVT Prophylaxis: enoxaparin (LOVENOX) injection 40 mg Start: 09/06/22 1000   Family Communication: None at bedside   Disposition Plan: Status is: Inpatient Remains inpatient appropriate because: Severity of less   Planned Discharge Destination:Skilled nursing facility-medically stable for discharge-awaiting insurance authorization   Diet: Diet Order             DIET SOFT Fluid consistency: Thin  Diet effective now  Antimicrobial agents: Anti-infectives (From admission, onward)    None        MEDICATIONS: Scheduled Meds:  enoxaparin (LOVENOX) injection  40 mg Subcutaneous Q24H   melatonin  3 mg Oral QHS   metoprolol tartrate  75 mg Oral BID   nicotine  14 mg Transdermal Daily   Ensure Max Protein  11 oz Oral QHS   tamsulosin  0.4 mg Oral Daily   torsemide  60 mg Oral BID   Continuous Infusions: PRN Meds:.acetaminophen **OR** acetaminophen, HYDROcodone-acetaminophen, polyethylene  glycol   I have personally reviewed following labs and imaging studies  LABORATORY DATA: CBC: Recent Labs  Lab 09/05/22 0501 09/06/22 0500 09/07/22 0351 09/08/22 0714 09/09/22 0212  WBC 9.7 7.8 8.6 6.8 9.5  HGB 13.0 11.8* 11.9* 11.9* 10.7*  HCT 40.5 36.6* 37.4* 37.7* 33.1*  MCV 90.6 91.5 93.3 94.3 90.4  PLT 98* 90* 100* 102* 120*    Basic Metabolic Panel: Recent Labs  Lab 09/03/22 0450 09/03/22 0945 09/05/22 0501  NA 133* 135 134*  K 3.6 3.7 3.8  CL 90*  --  93*  CO2 36*  --  32  GLUCOSE 116*  --  95  BUN 16  --  13  CREATININE 0.54*  --  0.56*  CALCIUM 8.6*  --  8.7*  MG 2.1  --   --   PHOS 2.7  --   --     GFR: Estimated Creatinine Clearance: 124.8 mL/min (A) (by C-G formula based on SCr of 0.56 mg/dL (L)).  Liver Function Tests: Recent Labs  Lab 09/03/22 0450 09/05/22 0501  AST 59* 38  ALT 94* 99*  ALKPHOS 65 69  BILITOT 2.1* 2.1*  PROT 5.9* 5.6*  ALBUMIN 2.8* 2.7*   No results for input(s): "LIPASE", "AMYLASE" in the last 168 hours. No results for input(s): "AMMONIA" in the last 168 hours.  Coagulation Profile: No results for input(s): "INR", "PROTIME" in the last 168 hours.  Cardiac Enzymes: No results for input(s): "CKTOTAL", "CKMB", "CKMBINDEX", "TROPONINI" in the last 168 hours.  BNP (last 3 results) No results for input(s): "PROBNP" in the last 8760 hours.  Lipid Profile: No results for input(s): "CHOL", "HDL", "LDLCALC", "TRIG", "CHOLHDL", "LDLDIRECT" in the last 72 hours.  Thyroid Function Tests: No results for input(s): "TSH", "T4TOTAL", "FREET4", "T3FREE", "THYROIDAB" in the last 72 hours.  Anemia Panel: No results for input(s): "VITAMINB12", "FOLATE", "FERRITIN", "TIBC", "IRON", "RETICCTPCT" in the last 72 hours.  Urine analysis:    Component Value Date/Time   COLORURINE YELLOW 11/26/2020 1200   APPEARANCEUR CLEAR 11/26/2020 1200   LABSPEC 1.017 11/26/2020 1200   PHURINE 5.0 11/26/2020 1200   GLUCOSEU NEGATIVE 11/26/2020  1200   HGBUR NEGATIVE 11/26/2020 1200   BILIRUBINUR NEGATIVE 11/26/2020 1200   KETONESUR NEGATIVE 11/26/2020 1200   PROTEINUR NEGATIVE 11/26/2020 1200   NITRITE NEGATIVE 11/26/2020 1200   LEUKOCYTESUR NEGATIVE 11/26/2020 1200    Sepsis Labs: Lactic Acid, Venous    Component Value Date/Time   LATICACIDVEN 0.8 11/25/2020 1610    MICROBIOLOGY: No results found for this or any previous visit (from the past 240 hour(s)).  RADIOLOGY STUDIES/RESULTS: No results found.   LOS: 6 days   Jeoffrey Massed, MD  [facility]    To contact the attending provider between 7A-7P or the covering provider during after hours 7P-7A, please log into the web site [email or web address] and access using universal [place] password for that web site. If you do not have the password, please call the hospital  operator.  09/09/2022, 10:11 AM

## 2022-09-09 NOTE — [place] FL2 (Signed)
[place] MEDICAID FL2 LEVEL OF CARE FORM     IDENTIFICATION  Patient Name: Jesus Rodriguez Birthdate: Sep 21, 1950 Sex: male Admission Date (Current Location): 09/03/2022  [facility] and [place] Number:  Jesus Rodriguez and Address:  [facility], [address], [place], [place] [address]      Provider Number: [id number]  Attending Physician Name and Address:  Maretta Bees, MD  Relative Name and Phone Number:       Current Level of Care: Hospital Recommended Level of Care: Skilled Nursing Facility Prior Approval Number:    Date Approved/Denied:   PASRR Number: [id number]  Discharge Plan: SNF    Current Diagnoses: Patient Active Problem List   Diagnosis Date Noted   Respiratory distress 09/30/2021   Acute on chronic diastolic CHF (congestive heart failure) (HCC) 09/17/2021   Acute on chronic respiratory failure with hypoxia and hypercarbia 09/17/2021   Mixed hyperlipidemia 09/17/2021   Hypotension 11/25/2020   AKI (acute kidney injury) (HCC) 11/24/2020   Chronic respiratory failure with hypoxia (HCC) 11/24/2020   Chronic diastolic CHF (congestive heart failure) (HCC) 11/24/2020   Tobacco use disorder 11/21/2020   Mild protein-calorie malnutrition (HCC) 11/21/2020   SBO (small bowel obstruction) (HCC) 11/20/2020   Obesity, Class III, BMI 40-49.9 (morbid obesity) (HCC)    COPD with acute exacerbation (HCC)    Acute on chronic diastolic HF (heart failure) (HCC)    Pressure injury of skin 11/28/2019   Acute respiratory failure with hypoxia (HCC) 11/27/2019   Atrial fibrillation (HCC) 05/11/2018   History of rheumatoid arthritis seronegative 09/10/2017   High risk medications (not anticoagulants) long-term use 02/14/2016   Class 3 obesity in adult 02/14/2016   Depression 02/14/2016   Osteoarthritis of both hands 02/13/2016   Osteoarthritis of both knees 02/13/2016   DDD (degenerative disc disease), lumbar 02/13/2016   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   Hypertension 02/13/2016   Elevated cholesterol 02/13/2016   Insomnia 02/13/2016   Anxiety 02/13/2016   Sleep apnea 02/13/2016   Arrhythmia 02/13/2016    Orientation RESPIRATION BLADDER Height & Weight     Self, Time, Situation, Place  Normal, O2 (2L) Incontinent, External catheter Weight: (!) 310 lb 12.8 oz (141 kg) Height:  6\' 2"  (188 cm)  BEHAVIORAL SYMPTOMS/MOOD NEUROLOGICAL BOWEL NUTRITION STATUS      Incontinent Diet (DIET SOFT Fluid consistency: Thin)  AMBULATORY STATUS COMMUNICATION OF NEEDS Skin   Extensive Assist Verbally Normal                       Personal Care Assistance Level of Assistance  Bathing, Feeding, Dressing Bathing Assistance: Maximum assistance Feeding assistance: Limited assistance Dressing Assistance: Maximum assistance     Functional Limitations Info  Sight, Hearing, Speech Sight Info: Adequate Hearing Info: Adequate Speech Info: Adequate    SPECIAL CARE FACTORS FREQUENCY  PT (By licensed PT), OT (By licensed OT)     PT Frequency: 5x a week OT Frequency: 5x a week            Contractures Contractures Info: Not present    Additional Factors Info  Code Status, Allergies   Allergies Info: Celebrex (Celecoxib), Methotrexate Derivatives, Prednisone, Sulfa Antibiotics           Current Medications (09/09/2022):  This is the current hospital active medication list Current Facility-Administered Medications  Medication Dose Route Frequency Provider Last Rate Last Admin   acetaminophen (TYLENOL) tablet 650 mg  650 mg Oral Q6H PRN Nettey,  Howell Pringle, MD   650 mg at 09/08/22 1200   Or   acetaminophen (TYLENOL) suppository 650 mg  650 mg Rectal Q6H PRN Narda Bonds, MD       enoxaparin (LOVENOX) injection 40 mg  40 mg Subcutaneous Q24H Narda Bonds, MD   40 mg at 09/09/22 0909   HYDROcodone-acetaminophen (NORCO/VICODIN) 5-325 MG per tablet 1 tablet  1 tablet Oral Q6H PRN Narda Bonds, MD   1 tablet at 09/09/22  0410   melatonin tablet 3 mg  3 mg Oral QHS Narda Bonds, MD   3 mg at 09/08/22 2115   metoprolol tartrate (LOPRESSOR) tablet 75 mg  75 mg Oral BID Narda Bonds, MD   75 mg at 09/09/22 5284   nicotine (NICODERM CQ - dosed in mg/24 hours) patch 14 mg  14 mg Transdermal Daily Narda Bonds, MD   14 mg at 09/09/22 0910   polyethylene glycol (MIRALAX / GLYCOLAX) packet 17 g  17 g Oral Daily PRN Narda Bonds, MD   17 g at 09/05/22 1825   protein supplement (ENSURE MAX) liquid  11 oz Oral QHS Narda Bonds, MD   11 oz at 09/08/22 2049   tamsulosin (FLOMAX) capsule 0.4 mg  0.4 mg Oral Daily Maretta Bees, MD   0.4 mg at 09/09/22 1324   torsemide (DEMADEX) tablet 60 mg  60 mg Oral BID Narda Bonds, MD   60 mg at 09/09/22 4010     Discharge Medications: Please see discharge summary for a list of discharge medications.  Relevant Imaging Results:  Relevant Lab Results:   Additional Information SSN: [id number]  Jesus Rodriguez, [place]

## 2022-09-09 NOTE — Plan of Care (Signed)
  Problem: Pain Managment: Goal: General experience of comfort will improve Outcome: Progressing   Problem: Health Behavior/Discharge Planning: Goal: Ability to manage health-related needs will improve Outcome: Progressing   Problem: Education: Goal: Knowledge of General Education information will improve Description: Including pain rating scale, medication(s)/side effects and non-pharmacologic comfort measures Outcome: Progressing   Problem: Clinical Measurements: Goal: Ability to maintain clinical measurements within normal limits will improve Outcome: Progressing Goal: Will remain free from infection Outcome: Progressing Goal: Diagnostic test results will improve Outcome: Progressing Goal: Respiratory complications will improve Outcome: Progressing Goal: Cardiovascular complication will be avoided Outcome: Progressing   Problem: Activity: Goal: Risk for activity intolerance will decrease Outcome: Progressing   Problem: Nutrition: Goal: Adequate nutrition will be maintained Outcome: Progressing   Problem: Coping: Goal: Level of anxiety will decrease Outcome: Progressing   Problem: Elimination: Goal: Will not experience complications related to bowel motility Outcome: Progressing Goal: Will not experience complications related to urinary retention Outcome: Progressing   Problem: Pain Managment: Goal: General experience of comfort will improve Outcome: Progressing   Problem: Safety: Goal: Ability to remain free from injury will improve Outcome: Progressing   Problem: Skin Integrity: Goal: Risk for impaired skin integrity will decrease Outcome: Progressing

## 2022-09-09 NOTE — Progress Notes (Signed)
Pt placed on CPAP tolerating well. 

## 2022-09-10 DIAGNOSIS — I5032 Chronic diastolic (congestive) heart failure: Secondary | ICD-10-CM

## 2022-09-10 DIAGNOSIS — J42 Unspecified chronic bronchitis: Secondary | ICD-10-CM | POA: Diagnosis not present

## 2022-09-10 DIAGNOSIS — G4733 Obstructive sleep apnea (adult) (pediatric): Secondary | ICD-10-CM | POA: Diagnosis not present

## 2022-09-10 DIAGNOSIS — K56609 Unspecified intestinal obstruction, unspecified as to partial versus complete obstruction: Secondary | ICD-10-CM | POA: Diagnosis not present

## 2022-09-10 MED ORDER — MELATONIN 3 MG PO TABS: 3.0000 mg | ORAL_TABLET | Freq: Every day | ORAL | 0 refills | Status: DC

## 2022-09-10 MED ORDER — ENSURE MAX PROTEIN PO LIQD: 11.0000 [oz_av] | Freq: Every day | ORAL | Status: DC

## 2022-09-10 MED ORDER — ORAL CARE MOUTH RINSE: 15.0000 mL | OROMUCOSAL | Status: DC | PRN

## 2022-09-10 NOTE — Progress Notes (Signed)
Physical Therapy Treatment Patient Details Name: Jesus Rodriguez MRN: [id number] DOB: 1951-02-03 Today's Date: 09/10/2022   History of Present Illness 72 y.o. male presents to [facility] hospital on 09/03/2022 as a transfer from [facility] with concern for SBO. Pt was initially hospitalized there with acute on chronic respiratory failure 2/2 CHF and PNA, pt required intubation, extubated 5/13. PMH includes afib, HTN, HLD, RA, scrotal cancer.    PT Comments    Pt greeted seated up EOB and agreeable to session with continued progress towards acute goals. Pt able to come to stand x3 trials from elevated EOB with min A to power up initially down to min guard with practice. Pt endorsing lift chair at home and declining transfer to recliner in room as pt stating it is too low and uncomfortable. Pt unable to progress pre-gait activities or gait away from EOB due to fatigue and HR tachy up to 155bpm, resolving with seated rest. Current plan remains appropriate to address deficits and maximize functional independence and decrease caregiver burden. Pt continues to benefit from skilled PT services to progress toward functional mobility goals.    Recommendations for follow up therapy are one component of a multi-disciplinary discharge planning process, led by the attending physician.  Recommendations may be updated based on patient status, additional functional criteria and insurance authorization.  Follow Up Recommendations  Can patient physically be transported by private vehicle: No    Assistance Recommended at Discharge Frequent or constant Supervision/Assistance  Patient can return home with the following A lot of help with walking and/or transfers;A lot of help with bathing/dressing/bathroom;Assistance with cooking/housework;Assist for transportation;Help with stairs or ramp for entrance   Equipment Recommendations  Other (comment) (TBA)    Recommendations for Other Services       Precautions /  Restrictions Precautions Precautions: Fall Precaution Comments: watch O2 sats Restrictions Weight Bearing Restrictions: No     Mobility  Bed Mobility Overal bed mobility: Needs Assistance             General bed mobility comments: sitting EOB on arrival    Transfers Overall transfer level: Needs assistance Equipment used: Rolling walker (2 wheels) Transfers: Sit to/from Stand Sit to Stand: Min assist, Min guard, From elevated surface           General transfer comment: min A down to min g to stand from elevated EOB to simulate lift chair height at home, unable to progress trasnfers to chair or gait as HR tachy up to 155bpm nonsustained    Ambulation/Gait                   Stairs             Wheelchair Mobility    Modified Rankin (Stroke Patients Only)       Balance Overall balance assessment: Needs assistance Sitting-balance support: Feet supported Sitting balance-Leahy Scale: Fair     Standing balance support: Bilateral upper extremity supported, Reliant on assistive device for balance Standing balance-Leahy Scale: Poor Standing balance comment: UE support needed for static standing                            Cognition Arousal/Alertness: Awake/alert Behavior During Therapy: WFL for tasks assessed/performed Overall Cognitive Status: Within Functional Limits for tasks assessed  General Comments: decreased awareness regarding safety and ability to DC home        Exercises      General Comments General comments (skin integrity, edema, etc.): HR tachy up to 155bpm with activity, SpO2 stable on supplemental O2      Pertinent Vitals/Pain Pain Assessment Pain Assessment: Faces Faces Pain Scale: Hurts a little bit Pain Location: bottom with scooting Pain Descriptors / Indicators: Discomfort, Sore Pain Intervention(s): Monitored during session, Limited activity within patient's  tolerance    Home Living                          Prior Function            PT Goals (current goals can now be found in the care plan section) Acute Rehab PT Goals Patient Stated Goal: to return home PT Goal Formulation: With patient Time For Goal Achievement: 09/19/22 Progress towards PT goals: Progressing toward goals    Frequency    Min 3X/week      PT Plan      Co-evaluation              AM-PAC PT "6 Clicks" Mobility   Outcome Measure  Help needed turning from your back to your side while in a flat bed without using bedrails?: A Lot Help needed moving from lying on your back to sitting on the side of a flat bed without using bedrails?: A Lot Help needed moving to and from a bed to a chair (including a wheelchair)?: Total Help needed standing up from a chair using your arms (e.g., wheelchair or bedside chair)?: A Little Help needed to walk in hospital room?: Total Help needed climbing 3-5 steps with a railing? : Total 6 Click Score: 10    End of Session Equipment Utilized During Treatment: Oxygen Activity Tolerance: Patient tolerated treatment well;Treatment limited secondary to medical complications (Comment) Patient left: with call bell/phone within reach;in bed (seated EOB) Nurse Communication: Mobility status PT Visit Diagnosis: Other abnormalities of gait and mobility (R26.89);Muscle weakness (generalized) (M62.81)     Time: 1610-9604 PT Time Calculation (min) (ACUTE ONLY): 30 min  Charges:  $Therapeutic Activity: 23-37 mins                     Stefan Markarian R. PTA Acute Rehabilitation Services Office: [phone]   Catalina Antigua 09/10/2022, 9:55 AM

## 2022-09-10 NOTE — Discharge Instructions (Signed)

## 2022-09-10 NOTE — Progress Notes (Signed)
3:53pm-CSW received call from patient's nephew, Royce Macadamia [phone]) expressing concern that patient went home today. CSW explained that per HIPAA, confidential information cannot be provided and Susy Frizzle is not listed on patient's Facesheet. CSW explained that patient has the right to make his own decisions and that he should follow up with the patient. CSW advised generally other options that he can look into such as applying for Medicaid for additional home services. He expressed understanding and will follow up with the patient, stating that he is "hard headed".   Joaquin Courts, MSW, [facility]

## 2022-09-10 NOTE — TOC Progression Note (Addendum)
Transition of Care [facility]) - Progression Note    Patient Details  Name: Jesus Rodriguez MRN: [id number] Date of Birth: 1950/10/06  Transition of [facility]) CM/SW Contact  [name], LCSW Phone Number: 09/10/2022, 10:03 AM  Clinical Narrative:    CSW received request to see patient. CSW met with patient and physical therapist at bedside. Patient stated that he does not want to go to the available SNFs and would like to return home instead with home health. CSW described limitations of home health services but patient reported that he has a helper that he pays to stay with him and help take care of him. He asked CSW, "what happened to my rights?". CSW assured him that the discharge plan is his decision. He stated he has home oxygen, cpap (here at the hospital with him), lift chair, rollator, bedside commode, and a ramp at home. He reported agreement with [facility] transport home and he has a key and his sisters or neighbor can help him once he arrives. CSW notified RN and MD and RNCM is assisting in getting wheelchair delivered to the home and home health services.   Update: Per Windell Moulding, [employer] has been contacted to deliver wheelchair and [facility] was set up for Home Health services.    Expected Discharge Plan: Home w Home Health Services Barriers to Discharge: Barriers Resolved  Expected Discharge Plan and Services In-house Referral: Clinical Social Work Discharge Planning Services: CM Consult Post Acute Care Choice: Home Health, Durable Medical Equipment Living arrangements for the past 2 months: Single Family Home Expected Discharge Date: 09/10/22                                     Social Determinants of Health (SDOH) Interventions SDOH Screenings   Food Insecurity: No Food Insecurity (09/03/2022)  Housing: Low Risk  (09/03/2022)  Transportation Needs: No Transportation Needs (09/03/2022)  Utilities: Not At Risk (09/03/2022)  Alcohol Screen: Low Risk  (11/05/2021)  Depression  (PHQ2-9): Low Risk  (12/30/2021)  Financial Resource Strain: Low Risk  (11/05/2021)  Physical Activity: Inactive (11/05/2021)  Social Connections: Socially Isolated (11/05/2021)  Stress: No Stress Concern Present (11/05/2021)  Tobacco Use: Medium Risk (09/03/2022)    Readmission Risk Interventions    09/20/2021   12:37 PM 11/27/2020    1:12 PM  Readmission Risk Prevention Plan  Transportation Screening Complete Complete  PCP or Specialist Appt within 5-7 Days Not Complete Not Complete  Home Care Screening Complete Complete  Medication Review (RN CM) Complete Complete

## 2022-09-10 NOTE — Plan of Care (Signed)
  Problem: Pain Managment: Goal: General experience of comfort will improve Outcome: Progressing   Problem: Education: Goal: Knowledge of General Education information will improve Description: Including pain rating scale, medication(s)/side effects and non-pharmacologic comfort measures Outcome: Progressing   Problem: Health Behavior/Discharge Planning: Goal: Ability to manage health-related needs will improve Outcome: Progressing   Problem: Clinical Measurements: Goal: Ability to maintain clinical measurements within normal limits will improve Outcome: Progressing Goal: Will remain free from infection Outcome: Progressing Goal: Diagnostic test results will improve Outcome: Progressing Goal: Respiratory complications will improve Outcome: Progressing Goal: Cardiovascular complication will be avoided Outcome: Progressing   Problem: Activity: Goal: Risk for activity intolerance will decrease Outcome: Progressing   Problem: Nutrition: Goal: Adequate nutrition will be maintained Outcome: Progressing   Problem: Coping: Goal: Level of anxiety will decrease Outcome: Progressing   Problem: Elimination: Goal: Will not experience complications related to bowel motility Outcome: Progressing Goal: Will not experience complications related to urinary retention Outcome: Progressing   Problem: Pain Managment: Goal: General experience of comfort will improve Outcome: Progressing   Problem: Safety: Goal: Ability to remain free from injury will improve Outcome: Progressing   Problem: Skin Integrity: Goal: Risk for impaired skin integrity will decrease Outcome: Progressing   

## 2022-09-10 NOTE — Discharge Summary (Signed)
PATIENT DETAILS Name: Jesus Rodriguez Age: 72 y.o. Sex: male Date of Birth: [birth year] MRN: [id number]. Admitting Physician: Darlin Drop, DO WUJ:[id number], Malachi Bonds, FNP  Admit Date: 09/03/2022 Discharge date: 09/10/2022  Recommendations for Outpatient Follow-up:  Follow up with PCP in 1-2 weeks Please obtain CMP/CBC in one week Please follow up on the following pending results:  Admitted From:  Home [facility]  Disposition: Skilled nursing facility   Discharge Condition: good  CODE STATUS:   Code Status: Full Code   Diet recommendation:  Diet Order             Diet - low sodium heart healthy           DIET SOFT Fluid consistency: Thin  Diet effective now                    Brief Summary: Patient is a 72 y.o.  male with history of COPD on home O2 4 L/m, OSA on CPAP, chronic HFpEF, HTN who initially presented to [facility] with shortness of breath-he was found to have worsening hypoxemia/hypercarbia due to HFpEF exacerbation-he was intubated- diuresed and successfully extubated on 5/13, further hospital course was complicated by SBO-and was subsequently transferred to [facility] for further evaluation.  He was evaluated by general surgery at [facility] SBO protocol which did not reveal any obstruction, NG tube was removed on 5/23-diet advanced-currently he is stable and awaiting SNF bed.  See below for further details.   Brief Hospital Course: SBO Resolved with supportive care Tolerating advancement diet Having almost daily bowel movements.   COPD with chronic hypoxic respiratory failure on 4 L of oxygen at home Stable-not in exacerbation Continue bronchodilators Continue O2-2-4 L to maintain O2 saturations above 88-90%.   Chronic HFpEF Euvolemic Torsemide Follow volume status/weight/electrolytes   Chronic atrial flutter Rate controlled with metoprolol Continue Eliquis   Thrombocytopenia Unclear etiology Improving Follow CBC periodically   Normocytic  anemia Secondary to critical illness Follow CBC periodically   HLD Lipitor   OSA BiPAP nightly   Debility/conditioning Secondary to acute illness PT/OT eval-SNF recommended.   Obesity/: Estimated body mass index is 39.9 kg/m as calculated from the following:   Height as of this encounter: 6\' 2"  (1.88 m).   Weight as of this encounter: 141 kg.   Discharge Diagnoses:  Principal Problem:   SBO (small bowel obstruction) (HCC)   Discharge Instructions:  Activity:  As tolerated with Full fall precautions use walker/cane & assistance as needed   Discharge Instructions     (HEART FAILURE PATIENTS) Call MD:  Anytime you have any of the following symptoms: 1) 3 pound weight gain in 24 hours or 5 pounds in 1 week 2) shortness of breath, with or without a dry hacking cough 3) swelling in the hands, feet or stomach 4) if you have to sleep on extra pillows at night in order to breathe.   Complete by: As directed    Call MD for:  difficulty breathing, headache or visual disturbances   Complete by: As directed    Diet - low sodium heart healthy   Complete by: As directed    Discharge instructions   Complete by: As directed    Follow with Primary MD  Gilmore Laroche, FNP in 1-2 weeks  Please get a complete blood count and chemistry panel checked by your Primary MD at your next visit, and again as instructed by your Primary MD.  Get Medicines reviewed and adjusted: Please take all your medications with  you for your next visit with your Primary MD  Laboratory/radiological data: Please request your Primary MD to go over all hospital tests and procedure/radiological results at the follow up, please ask your Primary MD to get all Hospital records sent to his/her office.  In some cases, they will be blood work, cultures and biopsy results pending at the time of your discharge. Please request that your primary care M.D. follows up on these results.  Also Note the following: If you  experience worsening of your admission symptoms, develop shortness of breath, life threatening emergency, suicidal or homicidal thoughts you must seek medical attention immediately by calling 911 or calling your MD immediately  if symptoms less severe.  You must read complete instructions/literature along with all the possible adverse reactions/side effects for all the Medicines you take and that have been prescribed to you. Take any new Medicines after you have completely understood and accpet all the possible adverse reactions/side effects.   Do not drive when taking Pain medications or sleeping medications (Benzodaizepines)  Do not take more than prescribed Pain, Sleep and Anxiety Medications. It is not advisable to combine anxiety,sleep and pain medications without talking with your primary care practitioner  Special Instructions: If you have smoked or chewed Tobacco  in the last 2 yrs please stop smoking, stop any regular Alcohol  and or any Recreational drug use.  Wear Seat belts while driving.  Please note: You were cared for by a hospitalist during your hospital stay. Once you are discharged, your primary care physician will handle any further medical issues. Please note that NO REFILLS for any discharge medications will be authorized once you are discharged, as it is imperative that you return to your primary care physician (or establish a relationship with a primary care physician if you do not have one) for your post hospital discharge needs so that they can reassess your need for medications and monitor your lab values.   Increase activity slowly   Complete by: As directed       Allergies as of 09/10/2022       Reactions   Celebrex [celecoxib]    Methotrexate Derivatives Other (See Comments)   Arrhythmia    Prednisone Other (See Comments)   Throwing up   Sulfa Antibiotics         Medication List     STOP taking these medications    amoxicillin-clavulanate 875-125 MG  tablet Commonly known as: AUGMENTIN   UNABLE TO FIND   UNABLE TO FIND       TAKE these medications    acetaminophen 500 MG tablet Commonly known as: TYLENOL Take 500 mg by mouth every 6 (six) hours as needed.   albuterol 108 (90 Base) MCG/ACT inhaler Commonly known as: VENTOLIN HFA Inhale 1 puff into the lungs every 6 (six) hours as needed. What changed: Another medication with the same name was changed. Make sure you understand how and when to take each.   albuterol (2.5 MG/3ML) 0.083% nebulizer solution Commonly known as: PROVENTIL INHALE 1 VIAL VIA NEBULIZER EVERY 6 HOURS AS NEEDED FOR WHEEZING OR SHORTNESS OF BREATH. What changed: See the new instructions.   atorvastatin 10 MG tablet Commonly known as: LIPITOR TAKE 1 TABLET BY MOUTH EVERY DAY   Eliquis 5 MG Tabs tablet Generic drug: apixaban TAKE 1 TABLET BY MOUTH TWICE A DAY   Ensure Max Protein Liqd Take 330 mLs (11 oz total) by mouth at bedtime.   ketoconazole 2 % cream Commonly known  as: NIZORAL Apply 1 application topically 2 (two) times daily as needed for irritation.   melatonin 3 MG Tabs tablet Take 1 tablet (3 mg total) by mouth at bedtime.   metoprolol tartrate 100 MG tablet Commonly known as: LOPRESSOR Take 1 tablet (100 mg total) by mouth 2 (two) times daily.   torsemide 20 MG tablet Commonly known as: DEMADEX TAKE 3 TABLETS (60 MG TOTAL) BY MOUTH 2 (TWO) TIMES DAILY.   Trelegy Ellipta 100-62.5-25 MCG/ACT Aepb Generic drug: Fluticasone-Umeclidin-Vilant Inhale 1 puff into the lungs daily.   triamcinolone cream 0.1 % Commonly known as: KENALOG Apply 1 application topically 3 (three) times daily.        Follow-up Information     Gilmore Laroche, FNP. Schedule an appointment as soon as possible for a visit in 1 week(s).   Specialty: Family Medicine Contact information: [address] [place] [address] [phone]                Allergies  Allergen Reactions    Celebrex [Celecoxib]    Methotrexate Derivatives Other (See Comments)    Arrhythmia    Prednisone Other (See Comments)    Throwing up   Sulfa Antibiotics      Other Procedures/Studies: DG Abd 1 View  Result Date: 09/04/2022 CLINICAL DATA:  Small-bowel obstruction EXAM: ABDOMEN - 1 VIEW COMPARISON:  09/03/2022 FINDINGS: Orogastric or nasogastric tube enters the stomach with the tip in the fundus. Previously administered contrast has passed almost entirely into the colon. Some persistent small bowel dilatation is noted. Findings could be consistent with resolving or partial small bowel obstruction. IMPRESSION: Previously administered contrast has passed almost entirely into the colon. Some persistent small bowel dilatation. Findings could be consistent with resolving or partial small bowel obstruction. Electronically Signed   By: Paulina Fusi M.D.   On: 09/04/2022 08:49   DG Abd Portable 1V-Small Bowel Obstruction Protocol-initial, 8 hr delay  Result Date: 09/03/2022 CLINICAL DATA:  8 hour delay. EXAM: PORTABLE ABDOMEN - 1 VIEW COMPARISON:  09/03/2022. FINDINGS: Distended loops of small bowel are noted in the abdomen measuring up to 5.1 cm. Contrast is noted in the small bowel in the pelvis. Air is seen in the visualized colon. An enteric tube terminates in the stomach. There are small bilateral pleural effusions with atelectasis or infiltrate at the lung bases. The heart is enlarged. IMPRESSION: Distended loops of gas filled small bowel in the abdomen, not significantly changed from the prior exam. Contrast is seen in the small bowel in the pelvis. There is insufficient imaging of the right lower quadrant and ascending colon. Additional views are recommended. Electronically Signed   By: Thornell Sartorius M.D.   On: 09/03/2022 20:53   DG CHEST PORT 1 VIEW  Result Date: 09/03/2022 CLINICAL DATA:  Chest pain.  Hypoxia. EXAM: PORTABLE CHEST 1 VIEW COMPARISON:  Radiographs earlier the same date, 09/02/2022  and 10/03/2021. CT 08/29/2022. FINDINGS: 0947 hours. Enteric tube has been advanced, tip projecting over the gastric fundus. The side hole is not well seen on the current study, but appears to be below the gastroesophageal junction. Right arm PICC appears unchanged near the superior cavoatrial junction. Unchanged cardiomegaly, left greater than right pleural effusions and associated left greater than right basilar airspace opacities. No evidence of pneumothorax. The bones appear unchanged. Telemetry leads overlie the chest. IMPRESSION: Interval advancement of the enteric tube, tip projecting over the gastric fundus. No other significant changes. Electronically Signed   By: Carey Bullocks  M.D.   On: 09/03/2022 10:22   DG Chest Port 1 View  Result Date: 09/03/2022 CLINICAL DATA:  Small-bowel obstruction. EXAM: PORTABLE CHEST 1 VIEW COMPARISON:  Earlier radiograph dated 09/02/2022. FINDINGS: Enteric tube with tip just distal to the GE junction. The side port of the tube appears in the distal esophagus. Recommend further advancing by additional 9 cm. There is air distention of small-bowel loops. Air is noted throughout the colon. Small bilateral pleural effusions and bibasilar consolidative changes. No pneumothorax. Stable cardiac silhouette. No acute osseous pathology. IMPRESSION: 1. Enteric tube with tip in the proximal stomach. Recommend further advancing by additional 9 cm. 2. Small bilateral pleural effusions and bibasilar consolidative changes. Electronically Signed   By: Elgie Collard M.D.   On: 09/03/2022 03:07   DG Abd Portable 1V  Result Date: 09/03/2022 CLINICAL DATA:  Small-bowel obstruction. EXAM: PORTABLE CHEST 1 VIEW COMPARISON:  Earlier radiograph dated 09/02/2022. FINDINGS: Enteric tube with tip just distal to the GE junction. The side port of the tube appears in the distal esophagus. Recommend further advancing by additional 9 cm. There is air distention of small-bowel loops. Air is noted  throughout the colon. Small bilateral pleural effusions and bibasilar consolidative changes. No pneumothorax. Stable cardiac silhouette. No acute osseous pathology. IMPRESSION: 1. Enteric tube with tip in the proximal stomach. Recommend further advancing by additional 9 cm. 2. Small bilateral pleural effusions and bibasilar consolidative changes. Electronically Signed   By: Elgie Collard M.D.   On: 09/03/2022 03:07     TODAY-DAY OF DISCHARGE:  Subjective:   Jesus Rodriguez today has no headache,no chest abdominal pain,no new weakness tingling or numbness, feels much better wants to go home today.   Objective:   Blood pressure 100/62, pulse 82, temperature (!) 97.3 F (36.3 C), temperature source Axillary, resp. rate 20, height 6\' 2"  (1.88 m), weight (!) 141 kg, SpO2 100 %.  Intake/Output Summary (Last 24 hours) at 09/10/2022 0854 Last data filed at 09/09/2022 2000 Gross per 24 hour  Intake --  Output 2000 ml  Net -2000 ml   Filed Weights   09/05/22 0712 09/06/22 0500 09/09/22 0300  Weight: 134.9 kg 134.9 kg (!) 141 kg    Exam: Awake Alert, Oriented *3, No new F.N deficits, Normal affect [place].AT,PERRAL Supple Neck,No JVD, No cervical lymphadenopathy appriciated.  Symmetrical Chest wall movement, Good air movement bilaterally, CTAB RRR,No Gallops,Rubs or new Murmurs, No Parasternal Heave +ve B.Sounds, Abd Soft, Non tender, No organomegaly appriciated, No rebound -guarding or rigidity. No Cyanosis, Clubbing or edema, No new Rash or bruise   PERTINENT RADIOLOGIC STUDIES: No results found.   PERTINENT LAB RESULTS: CBC: Recent Labs    09/08/22 0714 09/09/22 0212  WBC 6.8 9.5  HGB 11.9* 10.7*  HCT 37.7* 33.1*  PLT 102* 120*   CMET CMP     Component Value Date/Time   NA 134 (L) 09/05/2022 0501   NA 147 (H) 09/27/2021 1616   K 3.8 09/05/2022 0501   CL 93 (L) 09/05/2022 0501   CO2 32 09/05/2022 0501   GLUCOSE 95 09/05/2022 0501   BUN 13 09/05/2022 0501   BUN 18  09/27/2021 1616   CREATININE 0.56 (L) 09/05/2022 0501   CREATININE 0.66 (L) 10/07/2018 1356   CALCIUM 8.7 (L) 09/05/2022 0501   PROT 5.6 (L) 09/05/2022 0501   ALBUMIN 2.7 (L) 09/05/2022 0501   AST 38 09/05/2022 0501   ALT 99 (H) 09/05/2022 0501   ALKPHOS 69 09/05/2022 0501   BILITOT 2.1 (  H) 09/05/2022 0501   GFRNONAA >60 09/05/2022 0501   GFRNONAA 99 10/07/2018 1356   GFRAA >60 11/30/2019 0412   GFRAA 115 10/07/2018 1356    GFR Estimated Creatinine Clearance: 124.8 mL/min (A) (by C-G formula based on SCr of 0.56 mg/dL (L)). No results for input(s): "LIPASE", "AMYLASE" in the last 72 hours. No results for input(s): "CKTOTAL", "CKMB", "CKMBINDEX", "TROPONINI" in the last 72 hours. Invalid input(s): "POCBNP" No results for input(s): "DDIMER" in the last 72 hours. No results for input(s): "HGBA1C" in the last 72 hours. No results for input(s): "CHOL", "HDL", "LDLCALC", "TRIG", "CHOLHDL", "LDLDIRECT" in the last 72 hours. No results for input(s): "TSH", "T4TOTAL", "T3FREE", "THYROIDAB" in the last 72 hours.  Invalid input(s): "FREET3" No results for input(s): "VITAMINB12", "FOLATE", "FERRITIN", "TIBC", "IRON", "RETICCTPCT" in the last 72 hours. Coags: No results for input(s): "INR" in the last 72 hours.  Invalid input(s): "PT" Microbiology: No results found for this or any previous visit (from the past 240 hour(s)).  FURTHER DISCHARGE INSTRUCTIONS:  Get Medicines reviewed and adjusted: Please take all your medications with you for your next visit with your Primary MD  Laboratory/radiological data: Please request your Primary MD to go over all hospital tests and procedure/radiological results at the follow up, please ask your Primary MD to get all Hospital records sent to his/her office.  In some cases, they will be blood work, cultures and biopsy results pending at the time of your discharge. Please request that your primary care M.D. goes through all the records of your  hospital data and follows up on these results.  Also Note the following: If you experience worsening of your admission symptoms, develop shortness of breath, life threatening emergency, suicidal or homicidal thoughts you must seek medical attention immediately by calling 911 or calling your MD immediately  if symptoms less severe.  You must read complete instructions/literature along with all the possible adverse reactions/side effects for all the Medicines you take and that have been prescribed to you. Take any new Medicines after you have completely understood and accpet all the possible adverse reactions/side effects.   Do not drive when taking Pain medications or sleeping medications (Benzodaizepines)  Do not take more than prescribed Pain, Sleep and Anxiety Medications. It is not advisable to combine anxiety,sleep and pain medications without talking with your primary care practitioner  Special Instructions: If you have smoked or chewed Tobacco  in the last 2 yrs please stop smoking, stop any regular Alcohol  and or any Recreational drug use.  Wear Seat belts while driving.  Please note: You were cared for by a hospitalist during your hospital stay. Once you are discharged, your primary care physician will handle any further medical issues. Please note that NO REFILLS for any discharge medications will be authorized once you are discharged, as it is imperative that you return to your primary care physician (or establish a relationship with a primary care physician if you do not have one) for your post hospital discharge needs so that they can reassess your need for medications and monitor your lab values.  Total Time spent coordinating discharge including counseling, education and face to face time equals greater than 30 minutes.  SignedJeoffrey Massed 09/10/2022 8:54 AM

## 2022-09-12 ENCOUNTER — Telehealth: Payer: Self-pay | Admitting: Cardiology

## 2022-09-12 NOTE — Telephone Encounter (Signed)
Caller wants to know if Dr. Wyline Mood will provide patient with home health orders as patient does not currently have a PCP.

## 2022-09-19 NOTE — Telephone Encounter (Signed)
Jesus Rodriguez at [facility] informed and verbalized understanding of plan. Will reach out to PCP

## 2022-09-28 ENCOUNTER — Other Ambulatory Visit: Payer: Self-pay

## 2022-09-28 ENCOUNTER — Emergency Department (HOSPITAL_COMMUNITY): Payer: Medicare Other

## 2022-09-28 ENCOUNTER — Encounter (HOSPITAL_COMMUNITY): Payer: Self-pay | Admitting: Emergency Medicine

## 2022-09-28 ENCOUNTER — Inpatient Hospital Stay (HOSPITAL_COMMUNITY)
Admission: EM | Admit: 2022-09-28 | Discharge: 2022-10-01 | DRG: 193 | Disposition: A | Discharge status: Skilled Nursing Facility | Payer: Medicare Other | Source: Skilled Nursing Facility | Attending: Family Medicine | Admitting: Family Medicine

## 2022-09-28 DIAGNOSIS — J90 Pleural effusion, not elsewhere classified: Secondary | ICD-10-CM | POA: Insufficient documentation

## 2022-09-28 DIAGNOSIS — Z8042 Family history of malignant neoplasm of prostate: Secondary | ICD-10-CM

## 2022-09-28 DIAGNOSIS — J9811 Atelectasis: Secondary | ICD-10-CM | POA: Diagnosis present

## 2022-09-28 DIAGNOSIS — G4733 Obstructive sleep apnea (adult) (pediatric): Secondary | ICD-10-CM | POA: Diagnosis present

## 2022-09-28 DIAGNOSIS — Z8549 Personal history of malignant neoplasm of other male genital organs: Secondary | ICD-10-CM

## 2022-09-28 DIAGNOSIS — Z6839 Body mass index (BMI) 39.0-39.9, adult: Secondary | ICD-10-CM

## 2022-09-28 DIAGNOSIS — Z87891 Personal history of nicotine dependence: Secondary | ICD-10-CM

## 2022-09-28 DIAGNOSIS — Z888 Allergy status to other drugs, medicaments and biological substances status: Secondary | ICD-10-CM

## 2022-09-28 DIAGNOSIS — I480 Paroxysmal atrial fibrillation: Secondary | ICD-10-CM | POA: Diagnosis present

## 2022-09-28 DIAGNOSIS — E669 Obesity, unspecified: Secondary | ICD-10-CM | POA: Insufficient documentation

## 2022-09-28 DIAGNOSIS — M069 Rheumatoid arthritis, unspecified: Secondary | ICD-10-CM | POA: Diagnosis present

## 2022-09-28 DIAGNOSIS — Z820 Family history of epilepsy and other diseases of the nervous system: Secondary | ICD-10-CM

## 2022-09-28 DIAGNOSIS — Z1152 Encounter for screening for COVID-19: Secondary | ICD-10-CM

## 2022-09-28 DIAGNOSIS — F419 Anxiety disorder, unspecified: Secondary | ICD-10-CM | POA: Diagnosis present

## 2022-09-28 DIAGNOSIS — F32A Depression, unspecified: Secondary | ICD-10-CM | POA: Diagnosis present

## 2022-09-28 DIAGNOSIS — I110 Hypertensive heart disease with heart failure: Secondary | ICD-10-CM | POA: Diagnosis present

## 2022-09-28 DIAGNOSIS — E8809 Other disorders of plasma-protein metabolism, not elsewhere classified: Secondary | ICD-10-CM | POA: Diagnosis present

## 2022-09-28 DIAGNOSIS — Z7901 Long term (current) use of anticoagulants: Secondary | ICD-10-CM

## 2022-09-28 DIAGNOSIS — I4819 Other persistent atrial fibrillation: Secondary | ICD-10-CM | POA: Diagnosis present

## 2022-09-28 DIAGNOSIS — J189 Pneumonia, unspecified organism: Principal | ICD-10-CM | POA: Diagnosis present

## 2022-09-28 DIAGNOSIS — Z882 Allergy status to sulfonamides status: Secondary | ICD-10-CM

## 2022-09-28 DIAGNOSIS — D649 Anemia, unspecified: Secondary | ICD-10-CM | POA: Diagnosis present

## 2022-09-28 DIAGNOSIS — Z79890 Hormone replacement therapy (postmenopausal): Secondary | ICD-10-CM

## 2022-09-28 DIAGNOSIS — Z8249 Family history of ischemic heart disease and other diseases of the circulatory system: Secondary | ICD-10-CM

## 2022-09-28 DIAGNOSIS — E782 Mixed hyperlipidemia: Secondary | ICD-10-CM | POA: Diagnosis present

## 2022-09-28 DIAGNOSIS — Z833 Family history of diabetes mellitus: Secondary | ICD-10-CM

## 2022-09-28 DIAGNOSIS — M19042 Primary osteoarthritis, left hand: Secondary | ICD-10-CM | POA: Diagnosis present

## 2022-09-28 DIAGNOSIS — J440 Chronic obstructive pulmonary disease with acute lower respiratory infection: Secondary | ICD-10-CM | POA: Diagnosis present

## 2022-09-28 DIAGNOSIS — Z79899 Other long term (current) drug therapy: Secondary | ICD-10-CM

## 2022-09-28 DIAGNOSIS — R531 Weakness: Secondary | ICD-10-CM | POA: Diagnosis present

## 2022-09-28 DIAGNOSIS — M19041 Primary osteoarthritis, right hand: Secondary | ICD-10-CM | POA: Diagnosis present

## 2022-09-28 DIAGNOSIS — I5032 Chronic diastolic (congestive) heart failure: Secondary | ICD-10-CM | POA: Diagnosis present

## 2022-09-28 DIAGNOSIS — J9621 Acute and chronic respiratory failure with hypoxia: Secondary | ICD-10-CM | POA: Diagnosis present

## 2022-09-28 DIAGNOSIS — J9622 Acute and chronic respiratory failure with hypercapnia: Secondary | ICD-10-CM | POA: Diagnosis present

## 2022-09-28 LAB — COMPREHENSIVE METABOLIC PANEL
ALT: 13 U/L (ref 0–44)
AST: 16 U/L (ref 15–41)
Albumin: 2.8 g/dL — ABNORMAL LOW (ref 3.5–5.0)
Alkaline Phosphatase: 84 U/L (ref 38–126)
Anion gap: 8 (ref 5–15)
BUN: 10 mg/dL (ref 8–23)
CO2: 35 mmol/L — ABNORMAL HIGH (ref 22–32)
Calcium: 8.4 mg/dL — ABNORMAL LOW (ref 8.9–10.3)
Chloride: 91 mmol/L — ABNORMAL LOW (ref 98–111)
Creatinine, Ser: 0.71 mg/dL (ref 0.61–1.24)
GFR, Estimated: >60 mL/min (ref >60)
Glucose, Bld: 114 mg/dL — ABNORMAL HIGH (ref 70–99)
Potassium: 3.6 mmol/L (ref 3.5–5.1)
Sodium: 134 mmol/L — ABNORMAL LOW (ref 135–145)
Total Bilirubin: 0.6 mg/dL (ref 0.3–1.2)
Total Protein: 6.9 g/dL (ref 6.5–8.1)

## 2022-09-28 LAB — CBC WITH DIFFERENTIAL/PLATELET
Abs Immature Granulocytes: 0.06 10*3/uL (ref 0.00–0.07)
Basophils Absolute: 0 10*3/uL (ref 0.0–0.1)
Basophils Relative: 0 %
Eosinophils Absolute: 0.1 10*3/uL (ref 0.0–0.5)
Eosinophils Relative: 1 %
HCT: 35.5 % — ABNORMAL LOW (ref 39.0–52.0)
Hemoglobin: 11 g/dL — ABNORMAL LOW (ref 13.0–17.0)
Immature Granulocytes: 1 %
Lymphocytes Relative: 6 %
Lymphs Abs: 0.5 10*3/uL — ABNORMAL LOW (ref 0.7–4.0)
MCH: 29 pg (ref 26.0–34.0)
MCHC: 31 g/dL (ref 30.0–36.0)
MCV: 93.7 fL (ref 80.0–100.0)
Monocytes Absolute: 0.6 10*3/uL (ref 0.1–1.0)
Monocytes Relative: 7 %
Neutro Abs: 6.9 10*3/uL (ref 1.7–7.7)
Neutrophils Relative %: 85 %
Platelets: 294 10*3/uL (ref 150–400)
RBC: 3.79 MIL/uL — ABNORMAL LOW (ref 4.22–5.81)
RDW: 14.4 % (ref 11.5–15.5)
WBC: 8 10*3/uL (ref 4.0–10.5)
nRBC: 0 % (ref 0.0–0.2)

## 2022-09-28 LAB — TROPONIN I (HIGH SENSITIVITY)
Troponin I (High Sensitivity): 7 ng/L (ref <18)
Troponin I (High Sensitivity): 6 ng/L (ref <18)

## 2022-09-28 LAB — RESP PANEL BY RT-PCR (RSV, FLU A&B, COVID)  RVPGX2
Influenza A by PCR: NEGATIVE
Influenza B by PCR: NEGATIVE
Resp Syncytial Virus by PCR: NEGATIVE
SARS Coronavirus 2 by RT PCR: NEGATIVE

## 2022-09-28 LAB — BRAIN NATRIURETIC PEPTIDE: B Natriuretic Peptide: 130 pg/mL — ABNORMAL HIGH (ref 0.0–100.0)

## 2022-09-28 MED ORDER — ALBUTEROL SULFATE HFA 108 (90 BASE) MCG/ACT IN AERS: 2.0000 | INHALATION_SPRAY | RESPIRATORY_TRACT | Status: DC | PRN

## 2022-09-28 MED ORDER — IPRATROPIUM-ALBUTEROL 0.5-2.5 (3) MG/3ML IN SOLN
3.0000 mL | Freq: Once | RESPIRATORY_TRACT | Status: AC
  Administered 2022-09-28: 3 mL via RESPIRATORY_TRACT
  Filled 2022-09-28: qty 3

## 2022-09-28 MED ORDER — SODIUM CHLORIDE 0.9 % IV SOLN
2.0000 g | Freq: Once | INTRAVENOUS | Status: AC
  Administered 2022-09-29: 2 g via INTRAVENOUS
  Filled 2022-09-28: qty 12.5

## 2022-09-28 MED ORDER — MAGNESIUM SULFATE 2 GM/50ML IV SOLN
2.0000 g | Freq: Once | INTRAVENOUS | Status: AC
  Administered 2022-09-28: 2 g via INTRAVENOUS
  Filled 2022-09-28: qty 50

## 2022-09-28 MED ORDER — IOHEXOL 350 MG/ML SOLN
100.0000 mL | Freq: Once | INTRAVENOUS | Status: AC | PRN
  Administered 2022-09-28: 100 mL via INTRAVENOUS

## 2022-09-28 NOTE — ED Provider Notes (Signed)
[place] EMERGENCY DEPARTMENT AT [facility] Provider Note   CSN: [id number] Arrival date & time: 09/28/22  1701     History {Add pertinent medical, surgical, social history, OB history to HPI:1} Chief Complaint  Patient presents with   Shortness of Breath    Jesus Rodriguez is a 72 y.o. male.   Shortness of Breath      Home Medications Prior to Admission medications   Medication Sig Start Date End Date Taking? Authorizing Provider  acetaminophen (TYLENOL) 500 MG tablet Take 500 mg by mouth every 6 (six) hours as needed.    [provider]  albuterol (PROVENTIL) (2.5 MG/3ML) 0.083% nebulizer solution INHALE 1 VIAL VIA NEBULIZER EVERY 6 HOURS AS NEEDED FOR WHEEZING OR SHORTNESS OF BREATH. Patient taking differently: Take 2.5 mg by nebulization every 6 (six) hours as needed for wheezing. 02/03/22   Coralyn Helling, MD  albuterol (VENTOLIN HFA) 108 (90 Base) MCG/ACT inhaler Inhale 1 puff into the lungs every 6 (six) hours as needed. 10/21/21   Coralyn Helling, MD  apixaban (ELIQUIS) 5 MG TABS tablet TAKE 1 TABLET BY MOUTH TWICE A DAY 09/01/22   Antoine Poche, MD  atorvastatin (LIPITOR) 10 MG tablet TAKE 1 TABLET BY MOUTH EVERY DAY 07/31/22   Antoine Poche, MD  Ensure Max Protein (ENSURE MAX PROTEIN) LIQD Take 330 mLs (11 oz total) by mouth at bedtime. 09/10/22   Ghimire, Werner Lean, MD  Fluticasone-Umeclidin-Vilant (TRELEGY ELLIPTA) 100-62.5-25 MCG/ACT AEPB Inhale 1 puff into the lungs daily. 06/03/22   Coralyn Helling, MD  ketoconazole (NIZORAL) 2 % cream Apply 1 application topically 2 (two) times daily as needed for irritation. 03/27/20   [provider]  melatonin 3 MG TABS tablet Take 1 tablet (3 mg total) by mouth at bedtime. 09/10/22   Ghimire, Werner Lean, MD  metoprolol tartrate (LOPRESSOR) 100 MG tablet Take 1 tablet (100 mg total) by mouth 2 (two) times daily. 10/23/21 10/18/22  Strader, Lennart Pall, PA-C  torsemide (DEMADEX) 20 MG tablet TAKE 3 TABLETS  (60 MG TOTAL) BY MOUTH 2 (TWO) TIMES DAILY. 05/05/22   Antoine Poche, MD  triamcinolone (KENALOG) 0.1 % Apply 1 application topically 3 (three) times daily. 02/25/20   [provider]      Allergies    Celebrex [celecoxib], Methotrexate derivatives, Prednisone, and Sulfa antibiotics    Review of Systems   Review of Systems  Respiratory:  Positive for shortness of breath.     Physical Exam Updated Vital Signs BP 111/80   Pulse (!) 115   Temp 98.6 F (37 C) (Oral)   Resp (!) 21   Ht 6\' 2"  (1.88 m)   Wt (!) 141 kg   SpO2 97%   BMI 39.91 kg/m  Physical Exam  ED Results / Procedures / Treatments   Labs (all labs ordered are listed, but only abnormal results are displayed) Labs Reviewed  CBC WITH DIFFERENTIAL/PLATELET - Abnormal; Notable for the following components:      Result Value   RBC 3.79 (*)    Hemoglobin 11.0 (*)    HCT 35.5 (*)    Lymphs Abs 0.5 (*)    All other components within normal limits  COMPREHENSIVE METABOLIC PANEL - Abnormal; Notable for the following components:   Sodium 134 (*)    Chloride 91 (*)    CO2 35 (*)    Glucose, Bld 114 (*)    Calcium 8.4 (*)    Albumin 2.8 (*)    All  other components within normal limits  BRAIN NATRIURETIC PEPTIDE - Abnormal; Notable for the following components:   B Natriuretic Peptide 130.0 (*)    All other components within normal limits  RESP PANEL BY RT-PCR (RSV, FLU A&B, COVID)  RVPGX2  TROPONIN I (HIGH SENSITIVITY)  TROPONIN I (HIGH SENSITIVITY)    EKG None  Radiology CT Angio Chest PE W and/or Wo Contrast  Result Date: 09/28/2022 CLINICAL DATA:  Shortness of breath. Pulmonary embolism suspected. High probability. EXAM: CT ANGIOGRAPHY CHEST WITH CONTRAST TECHNIQUE: Multidetector CT imaging of the chest was performed using the standard protocol during bolus administration of intravenous contrast. Multiplanar CT image reconstructions and MIPs were obtained to evaluate the vascular anatomy.  RADIATION DOSE REDUCTION: This exam was performed according to the departmental dose-optimization program which includes automated exposure control, adjustment of the mA and/or kV according to patient size and/or use of iterative reconstruction technique. CONTRAST:  OMNIPAQUE IOHEXOL 350 MG/ML SOLN COMPARISON:  Portable chest today, portable chest 09/15/2022, portable chest 09/03/2022, CT chest without contrast 08/29/2022, CTA chest 08/21/2022 FINDINGS: Cardiovascular: There is moderate panchamber cardiomegaly. Prominent pulmonary trunk again noted 4.1 cm indicating arterial hypertension with additional prominence of the main pulmonary arteries. No arterial embolism is seen through the segmental divisions. The subsegmental arteries are poorly evaluated due to breathing motion and due to consolidation/atelectasis in the lower lobes. There is mild aortic tortuosity and atherosclerosis without aneurysm, stenosis or dissection. Minimal scattered plaque in the great vessels without stenosis in the great vessels. There are three-vessel coronary artery calcifications. No pericardial effusion. Central pulmonary veins are prominent but no more than previously. Mediastinum/Nodes: Mildly enlarged mediastinal lymph nodes appear similar to the prior studies, likely reactive adenopathy. Examples include a 1.4 cm in short axis precarinal node to the right on 4:38, 1 cm short axis right hilar lymph node on 4:42, low left paratracheal lymph node 1 cm short axis on 4:34. There are no thyroid nodules or axillary adenopathy. Thoracic esophagus is unremarkable. No tracheal or main bronchial mass is seen. There is a mucoid septation in the left main bronchus. Lungs/Pleura: There are small bilateral layering pleural effusions, both improved since last month. Consolidation in the adjacent left-greater-than-right lower lobes is still seen and similar. The consolidation is more confluent in the left lower lobe, and there are additional  patchy increased opacities today posteriorly in the left upper lobe. Central bronchi are mildly thickened. There are faint perihilar ground-glass densities in both upper lobes which could relate to low lung volumes or could be minimal ground-glass edema. No subpleural edema is seen. Remainder of the lungs are clear with no other new abnormality. Posterior right upper lobe consolidation has cleared since both prior studies. Lung apices again shown mild paraseptal emphysema. Upper Abdomen: No acute abnormality. Scattered left hepatic cysts with mild hepatic steatosis. Musculoskeletal: Osteopenia. Extensive thoracic spine bridging enthesopathy. No acute osseous findings or aggressive lesions. No significant chest wall findings. Review of the MIP images confirms the above findings. IMPRESSION: 1. No evidence of arterial embolus through the segmental divisions. The subsegmental arteries are poorly evaluated due to breathing motion and due to consolidation/atelectasis in the lower lobes. 2. Cardiomegaly with prominent central pulmonary veins but no more than previously. 3. Aortic and coronary artery atherosclerosis. 4. Prominent pulmonary trunk and main pulmonary arteries consistent with arterial hypertension, chronic. 5. Small layering pleural effusions, both improved since last month. 6. Bilateral left-greater-than-right lower lobe consolidation/atelectasis, with more confluent consolidation in the left lower lobe and additional  patchy increased opacities in the left upper lobe. Findings consistent with multifocal pneumonia. 7. Faint perihilar ground-glass densities in both upper lobes which could relate to low lung volumes or could be minimal ground-glass edema. No subpleural edema is seen. 8. Mildly enlarged mediastinal lymph nodes similar to prior studies, likely reactive. 9. Osteopenia and degenerative change. 10. Emphysema. Aortic Atherosclerosis (ICD10-I70.0) and Emphysema (ICD10-J43.9). Electronically Signed    By: Almira Bar M.D.   On: 09/28/2022 23:36   DG Chest Port 1 View  Result Date: 09/28/2022 CLINICAL DATA:  [id number] SOB (shortness of breath) [id number] EXAM: PORTABLE CHEST 1 VIEW COMPARISON:  September 15, 2022 FINDINGS: The cardiomediastinal silhouette is unchanged and enlarged in contour. Similar appearance of bilateral pleural effusions. Unchanged elevation of LEFT hemidiaphragm. No pneumothorax. Mildly increased airspace opacity along the LEFT paramediastinal border with likely complete LEFT lower lobe atelectasis. Persistent airspace opacity along the RIGHT inferior paramediastinal border. IMPRESSION: 1. Similar appearance of bilateral pleural effusions. 2. Mildly increased airspace opacity along the LEFT paramediastinal border with likely complete LEFT lower lobe atelectasis. Electronically Signed   By: Meda Klinefelter M.D.   On: 09/28/2022 18:08    Procedures Procedures  {Document cardiac monitor, telemetry assessment procedure when appropriate:1}  Medications Ordered in ED Medications  albuterol (VENTOLIN HFA) 108 (90 Base) MCG/ACT inhaler 2 puff (has no administration in time range)  ipratropium-albuterol (DUONEB) 0.5-2.5 (3) MG/3ML nebulizer solution 3 mL (3 mLs Nebulization Given 09/28/22 2204)  magnesium sulfate IVPB 2 g 50 mL (0 g Intravenous Stopped 09/28/22 2243)  iohexol (OMNIPAQUE) 350 MG/ML injection 100 mL (100 mLs Intravenous Contrast Given 09/28/22 2249)    ED Course/ Medical Decision Making/ A&P   {   Click here for ABCD2, HEART and other calculatorsREFRESH Note before signing :1}                          Medical Decision Making Amount and/or Complexity of Data Reviewed Labs: ordered. Radiology: ordered.  Risk Prescription drug management. Decision regarding hospitalization.   ***  {Document critical care time when appropriate:1} {Document review of labs and clinical decision tools ie heart score, Chads2Vasc2 etc:1}  {Document your independent review of radiology  images, and any outside records:1} {Document your discussion with family members, caretakers, and with consultants:1} {Document social determinants of health affecting pt's care:1} {Document your decision making why or why not admission, treatments were needed:1} Final Clinical Impression(s) / ED Diagnoses Final diagnoses:  None    Rx / DC Orders ED Discharge Orders     None

## 2022-09-28 NOTE — ED Notes (Signed)
Patient transported to CT 

## 2022-09-28 NOTE — ED Triage Notes (Signed)
Pt is on continuous o2 at 4lpm and has been sob since yesterday.

## 2022-09-28 NOTE — Progress Notes (Addendum)
Pharmacy Antibiotic Note  Gwendolyn Carthan is a 72 y.o. male admitted on 09/28/2022 presenting with SOB, recent admission with intubation.  Pharmacy has been consulted for vancomycin dosing.  Plan: Vancomycin 2000 mg IV x 1, then 1500 mg IV q 12h (eAUC 501) Add MRSA PCR Monitor renal function, Cx/PCR to narrow Vancomycin levels as indicated  Height: 6\' 2"  (188 cm) Weight: (!) 141 kg (310 lb 13.6 oz) IBW/kg (Calculated) : 82.2  Temp (24hrs), Avg:98.5 F (36.9 C), Min:98.3 F (36.8 C), Max:98.6 F (37 C)  Recent Labs  Lab 09/28/22 2029  WBC 8.0  CREATININE 0.71    Estimated Creatinine Clearance: 124.8 mL/min (by C-G formula based on SCr of 0.71 mg/dL).    Allergies  Allergen Reactions   Celebrex [Celecoxib]    Methotrexate Derivatives Other (See Comments)    Arrhythmia    Prednisone Other (See Comments)    Throwing up   Sulfa Antibiotics     Daylene Posey, PharmD, [facility] Clinical Pharmacist ED Pharmacist Phone # [phone] 09/28/2022 11:59 PM

## 2022-09-29 DIAGNOSIS — I5032 Chronic diastolic (congestive) heart failure: Secondary | ICD-10-CM | POA: Diagnosis present

## 2022-09-29 DIAGNOSIS — Z87891 Personal history of nicotine dependence: Secondary | ICD-10-CM | POA: Diagnosis not present

## 2022-09-29 DIAGNOSIS — Z6839 Body mass index (BMI) 39.0-39.9, adult: Secondary | ICD-10-CM | POA: Diagnosis not present

## 2022-09-29 DIAGNOSIS — D649 Anemia, unspecified: Secondary | ICD-10-CM | POA: Diagnosis present

## 2022-09-29 DIAGNOSIS — J9811 Atelectasis: Secondary | ICD-10-CM | POA: Diagnosis present

## 2022-09-29 DIAGNOSIS — I4819 Other persistent atrial fibrillation: Secondary | ICD-10-CM | POA: Diagnosis present

## 2022-09-29 DIAGNOSIS — E782 Mixed hyperlipidemia: Secondary | ICD-10-CM | POA: Diagnosis present

## 2022-09-29 DIAGNOSIS — Z1152 Encounter for screening for COVID-19: Secondary | ICD-10-CM | POA: Diagnosis not present

## 2022-09-29 DIAGNOSIS — J9622 Acute and chronic respiratory failure with hypercapnia: Secondary | ICD-10-CM | POA: Diagnosis present

## 2022-09-29 DIAGNOSIS — F32A Depression, unspecified: Secondary | ICD-10-CM | POA: Diagnosis present

## 2022-09-29 DIAGNOSIS — Z8249 Family history of ischemic heart disease and other diseases of the circulatory system: Secondary | ICD-10-CM | POA: Diagnosis not present

## 2022-09-29 DIAGNOSIS — I110 Hypertensive heart disease with heart failure: Secondary | ICD-10-CM | POA: Diagnosis present

## 2022-09-29 DIAGNOSIS — E8809 Other disorders of plasma-protein metabolism, not elsewhere classified: Secondary | ICD-10-CM

## 2022-09-29 DIAGNOSIS — R531 Weakness: Secondary | ICD-10-CM | POA: Diagnosis present

## 2022-09-29 DIAGNOSIS — J90 Pleural effusion, not elsewhere classified: Secondary | ICD-10-CM | POA: Diagnosis not present

## 2022-09-29 DIAGNOSIS — J440 Chronic obstructive pulmonary disease with acute lower respiratory infection: Secondary | ICD-10-CM | POA: Diagnosis present

## 2022-09-29 DIAGNOSIS — Z7901 Long term (current) use of anticoagulants: Secondary | ICD-10-CM | POA: Diagnosis not present

## 2022-09-29 DIAGNOSIS — M069 Rheumatoid arthritis, unspecified: Secondary | ICD-10-CM | POA: Diagnosis present

## 2022-09-29 DIAGNOSIS — J9621 Acute and chronic respiratory failure with hypoxia: Secondary | ICD-10-CM | POA: Diagnosis present

## 2022-09-29 DIAGNOSIS — E669 Obesity, unspecified: Secondary | ICD-10-CM

## 2022-09-29 DIAGNOSIS — I480 Paroxysmal atrial fibrillation: Secondary | ICD-10-CM | POA: Diagnosis present

## 2022-09-29 DIAGNOSIS — E46 Unspecified protein-calorie malnutrition: Secondary | ICD-10-CM

## 2022-09-29 DIAGNOSIS — G4733 Obstructive sleep apnea (adult) (pediatric): Secondary | ICD-10-CM | POA: Diagnosis present

## 2022-09-29 DIAGNOSIS — J189 Pneumonia, unspecified organism: Secondary | ICD-10-CM | POA: Diagnosis present

## 2022-09-29 DIAGNOSIS — Z8549 Personal history of malignant neoplasm of other male genital organs: Secondary | ICD-10-CM | POA: Diagnosis not present

## 2022-09-29 DIAGNOSIS — Z882 Allergy status to sulfonamides status: Secondary | ICD-10-CM | POA: Diagnosis not present

## 2022-09-29 LAB — CBC
HCT: 34.6 % — ABNORMAL LOW (ref 39.0–52.0)
Hemoglobin: 10.8 g/dL — ABNORMAL LOW (ref 13.0–17.0)
MCH: 29.1 pg (ref 26.0–34.0)
MCHC: 31.2 g/dL (ref 30.0–36.0)
MCV: 93.3 fL (ref 80.0–100.0)
Platelets: 270 10*3/uL (ref 150–400)
RBC: 3.71 MIL/uL — ABNORMAL LOW (ref 4.22–5.81)
RDW: 14.3 % (ref 11.5–15.5)
WBC: 7.7 10*3/uL (ref 4.0–10.5)
nRBC: 0 % (ref 0.0–0.2)

## 2022-09-29 LAB — COMPREHENSIVE METABOLIC PANEL
ALT: 11 U/L (ref 0–44)
AST: 14 U/L — ABNORMAL LOW (ref 15–41)
Albumin: 2.8 g/dL — ABNORMAL LOW (ref 3.5–5.0)
Alkaline Phosphatase: 81 U/L (ref 38–126)
Anion gap: 10 (ref 5–15)
BUN: 10 mg/dL (ref 8–23)
CO2: 31 mmol/L (ref 22–32)
Calcium: 8.3 mg/dL — ABNORMAL LOW (ref 8.9–10.3)
Chloride: 93 mmol/L — ABNORMAL LOW (ref 98–111)
Creatinine, Ser: 0.63 mg/dL (ref 0.61–1.24)
GFR, Estimated: >60 mL/min (ref >60)
Glucose, Bld: 128 mg/dL — ABNORMAL HIGH (ref 70–99)
Potassium: 3.7 mmol/L (ref 3.5–5.1)
Sodium: 134 mmol/L — ABNORMAL LOW (ref 135–145)
Total Bilirubin: 0.6 mg/dL (ref 0.3–1.2)
Total Protein: 6.3 g/dL — ABNORMAL LOW (ref 6.5–8.1)

## 2022-09-29 LAB — PHOSPHORUS: Phosphorus: 3.1 mg/dL (ref 2.5–4.6)

## 2022-09-29 LAB — MAGNESIUM: Magnesium: 2.3 mg/dL (ref 1.7–2.4)

## 2022-09-29 MED ORDER — SODIUM CHLORIDE 0.9 % IV SOLN
2.0000 g | Freq: Three times a day (TID) | INTRAVENOUS | Status: DC
  Administered 2022-09-29 – 2022-09-30 (×4): 2 g via INTRAVENOUS
  Filled 2022-09-29 (×5): qty 12.5

## 2022-09-29 MED ORDER — ACETAMINOPHEN 325 MG PO TABS
650.0000 mg | ORAL_TABLET | Freq: Four times a day (QID) | ORAL | Status: DC | PRN
  Administered 2022-09-29 – 2022-10-01 (×2): 650 mg via ORAL
  Filled 2022-09-29 (×2): qty 2

## 2022-09-29 MED ORDER — ENSURE ENLIVE PO LIQD: 237.0000 mL | Freq: Two times a day (BID) | ORAL | Status: DC

## 2022-09-29 MED ORDER — ENSURE MAX PROTEIN PO LIQD
11.0000 [oz_av] | Freq: Every day | ORAL | Status: DC
  Administered 2022-09-29: 11 [oz_av] via ORAL

## 2022-09-29 MED ORDER — ONDANSETRON HCL 4 MG PO TABS: 4.0000 mg | ORAL_TABLET | Freq: Four times a day (QID) | ORAL | Status: DC | PRN

## 2022-09-29 MED ORDER — ENOXAPARIN SODIUM 40 MG/0.4ML IJ SOSY: 40.0000 mg | PREFILLED_SYRINGE | INTRAMUSCULAR | Status: DC

## 2022-09-29 MED ORDER — ONDANSETRON HCL 4 MG/2ML IJ SOLN: 4.0000 mg | Freq: Four times a day (QID) | INTRAMUSCULAR | Status: DC | PRN

## 2022-09-29 MED ORDER — ATORVASTATIN CALCIUM 10 MG PO TABS
10.0000 mg | ORAL_TABLET | Freq: Every day | ORAL | Status: DC
  Administered 2022-09-29 – 2022-10-01 (×3): 10 mg via ORAL
  Filled 2022-09-29 (×3): qty 1

## 2022-09-29 MED ORDER — FLUTICASONE FUROATE-VILANTEROL 100-25 MCG/ACT IN AEPB
1.0000 | INHALATION_SPRAY | Freq: Every day | RESPIRATORY_TRACT | Status: DC
  Administered 2022-09-29 – 2022-10-01 (×3): 1 via RESPIRATORY_TRACT
  Filled 2022-09-29: qty 28

## 2022-09-29 MED ORDER — VANCOMYCIN HCL 1500 MG/300ML IV SOLN
1500.0000 mg | Freq: Two times a day (BID) | INTRAVENOUS | Status: DC
  Administered 2022-09-29 – 2022-09-30 (×3): 1500 mg via INTRAVENOUS
  Filled 2022-09-29 (×3): qty 300

## 2022-09-29 MED ORDER — UMECLIDINIUM BROMIDE 62.5 MCG/ACT IN AEPB
1.0000 | INHALATION_SPRAY | Freq: Every day | RESPIRATORY_TRACT | Status: DC
  Administered 2022-09-29 – 2022-10-01 (×3): 1 via RESPIRATORY_TRACT
  Filled 2022-09-29: qty 7

## 2022-09-29 MED ORDER — APIXABAN 5 MG PO TABS
5.0000 mg | ORAL_TABLET | Freq: Two times a day (BID) | ORAL | Status: DC
  Administered 2022-09-29 – 2022-10-01 (×5): 5 mg via ORAL
  Filled 2022-09-29 (×5): qty 1

## 2022-09-29 MED ORDER — LEVALBUTEROL HCL 0.63 MG/3ML IN NEBU
0.6300 mg | INHALATION_SOLUTION | Freq: Four times a day (QID) | RESPIRATORY_TRACT | Status: DC
  Administered 2022-09-29 – 2022-09-30 (×6): 0.63 mg via RESPIRATORY_TRACT
  Filled 2022-09-29 (×6): qty 3

## 2022-09-29 MED ORDER — FUROSEMIDE 10 MG/ML IJ SOLN
40.0000 mg | Freq: Two times a day (BID) | INTRAMUSCULAR | Status: DC
  Administered 2022-09-29 – 2022-10-01 (×5): 40 mg via INTRAVENOUS
  Filled 2022-09-29 (×5): qty 4

## 2022-09-29 MED ORDER — DM-GUAIFENESIN ER 30-600 MG PO TB12
1.0000 | ORAL_TABLET | Freq: Two times a day (BID) | ORAL | Status: DC
  Administered 2022-09-29 – 2022-10-01 (×5): 1 via ORAL
  Filled 2022-09-29 (×5): qty 1

## 2022-09-29 MED ORDER — VANCOMYCIN HCL 2000 MG/400ML IV SOLN
2000.0000 mg | Freq: Once | INTRAVENOUS | Status: AC
  Administered 2022-09-29: 2000 mg via INTRAVENOUS
  Filled 2022-09-29: qty 400

## 2022-09-29 MED ORDER — ACETAMINOPHEN 650 MG RE SUPP: 650.0000 mg | Freq: Four times a day (QID) | RECTAL | Status: DC | PRN

## 2022-09-29 NOTE — H&P (Signed)
History and Physical    Patient: Jesus Rodriguez ZOX:[id number] DOB: [birth year] DOA: 09/28/2022 DOS: the patient was seen and examined on 09/29/2022 PCP: System, Provider Not In  Patient coming from: SNF  Chief Complaint:  Chief Complaint  Patient presents with   Shortness of Breath   HPI: Jesus Rodriguez is a 72 y.o. male with medical history significant of chronic atrial fibrillation on Eliquis, chronic diastolic CHF, OSA on CPAP, morbid obesity, chronic hypoxia on 4 L nasal cannula at baseline, hypertension, hyperlipidemia, chronic anxiety/depression who presents to the emergency department due to 2-day onset of worsening shortness of breath.  He complained of productive cough of grayish-white sputum, he was placed on Levaquin at the nursing facility.  O2 sat was noted to be whorls today, so EMS was activated and patient was sent to the ED for further evaluation and management.  Patient denies fever, chills, chest pain.  Patient states that was recently admitted for acute respiratory failure and that he was placed on a ventilator.  ED Course:  In the emergency department, respiratory rate was 35 minutes on arrival to the ED, BP was 113/58, O2 sat was 97% on 4 LPM, other vital signs are within normal range.  Workup in the ED showed normocytic anemia, BMP showed sodium 134, potassium 3.6, chloride 91, bicarb 35, blood glucose 114, BUN 10, creatinine 0.71.  BNP 130 (this was 59.1 on 09/03/2022), troponin x 2 was normal.  Influenza A, B, SARS coronavirus 2, RSV was negative. Chest x-ray showed bilateral pleural effusions and mildly increased airspace opacity along the left paramediastinal border with likely complete left lower lobe atelectasis CT angiography chest with contrast was suggestive of multifocal pneumonia. Patient was started on IV vancomycin and cefepime.  Hospitalist was asked to admit patient for further evaluation and management.  Review of Systems: Review of systems as noted in the  HPI. All other systems reviewed and are negative.   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 His smoking use included cigarettes. He started smoking about 51 years ago. He has a 70.00 pack-year smoking history. He has never been exposed to tobacco smoke. He has never used smokeless tobacco. He reports that he does not drink alcohol and does not use drugs.   Allergies  Allergen Reactions   Celebrex [Celecoxib]    Methotrexate Derivatives Other (See Comments)    Arrhythmia    Prednisone Other (See Comments)    Throwing up   Sulfa Antibiotics     Family History  Problem Relation Age of Onset   Hypertension Father    Heart attack Father    Prostate cancer Father  Hypertension Sister    Hypertension Sister    Hypertension Sister    Epilepsy Son    Diabetes Son      Prior to Admission medications   Medication Sig Start Date End Date Taking? Authorizing Provider  acetaminophen (TYLENOL) 500 MG tablet Take 500 mg by mouth every 6 (six) hours as needed.    [provider]  albuterol (PROVENTIL) (2.5 MG/3ML) 0.083% nebulizer solution INHALE 1 VIAL VIA NEBULIZER  EVERY 6 HOURS AS NEEDED FOR WHEEZING OR SHORTNESS OF BREATH. Patient taking differently: Take 2.5 mg by nebulization every 6 (six) hours as needed for wheezing. 02/03/22   Coralyn Helling, MD  albuterol (VENTOLIN HFA) 108 (90 Base) MCG/ACT inhaler Inhale 1 puff into the lungs every 6 (six) hours as needed. 10/21/21   Coralyn Helling, MD  apixaban (ELIQUIS) 5 MG TABS tablet TAKE 1 TABLET BY MOUTH TWICE A DAY 09/01/22   Antoine Poche, MD  atorvastatin (LIPITOR) 10 MG tablet TAKE 1 TABLET BY MOUTH EVERY DAY 07/31/22   Antoine Poche, MD  Ensure Max Protein (ENSURE MAX PROTEIN) LIQD Take 330 mLs (11 oz total) by mouth at bedtime. 09/10/22   Ghimire, Werner Lean, MD  Fluticasone-Umeclidin-Vilant (TRELEGY ELLIPTA) 100-62.5-25 MCG/ACT AEPB Inhale 1 puff into the lungs daily. 06/03/22   Coralyn Helling, MD  ketoconazole (NIZORAL) 2 % cream Apply 1 application topically 2 (two) times daily as needed for irritation. 03/27/20   [provider]  melatonin 3 MG TABS tablet Take 1 tablet (3 mg total) by mouth at bedtime. 09/10/22   Ghimire, Werner Lean, MD  metoprolol tartrate (LOPRESSOR) 100 MG tablet Take 1 tablet (100 mg total) by mouth 2 (two) times daily. 10/23/21 10/18/22  Strader, Lennart [place], PA-C  torsemide (DEMADEX) 20 MG tablet TAKE 3 TABLETS (60 MG TOTAL) BY MOUTH 2 (TWO) TIMES DAILY. 05/05/22   Antoine Poche, MD  triamcinolone (KENALOG) 0.1 % Apply 1 application topically 3 (three) times daily. 02/25/20   [provider]    Physical Exam: BP 121/64 (BP Location: Right Arm)   Pulse (!) 116   Temp 98.4 F (36.9 C) (Oral)   Resp 20   Ht 6\' 2"  (1.88 m)   Wt (!) 141 kg   SpO2 97%   BMI 39.91 kg/m   General: 72 y.o. year-old male well developed well nourished in no acute distress.  Alert and oriented x3. HEENT: NCAT, EOMI Neck: Supple, trachea medial Cardiovascular: Tachycardia.  Irregularly irregular rate and rhythm with no rubs or gallops.  No thyromegaly or JVD noted.  +2 No lower  extremity edema. 2/4 pulses in all 4 extremities. Respiratory: Tachypnea.  Bilateral rhonchi on auscultation with minimal wheezing Abdomen: Soft, nontender nondistended with normal bowel sounds x4 quadrants. Muskuloskeletal: Bilateral lower extremity chronic lymphedema with thickened skin.   Neuro: CN II-XII intact, strength 5/5 x 4, sensation, reflexes intact Skin: No ulcerative lesions noted or rashes Psychiatry: Judgement and insight appear normal. Mood is appropriate for condition and setting          Labs on Admission:  Basic Metabolic Panel: Recent Labs  Lab 09/28/22 2029  NA 134*  K 3.6  CL 91*  CO2 35*  GLUCOSE 114*  BUN 10  CREATININE 0.71  CALCIUM 8.4*   Liver Function Tests: Recent Labs  Lab 09/28/22 2029  AST 16  ALT 13  ALKPHOS 84  BILITOT 0.6  PROT 6.9  ALBUMIN 2.8*   No results for input(s): "LIPASE", "AMYLASE" in the last  168 hours. No results for input(s): "AMMONIA" in the last 168 hours. CBC: Recent Labs  Lab 09/28/22 2029 09/29/22 0522  WBC 8.0 7.7  NEUTROABS 6.9  --   HGB 11.0* 10.8*  HCT 35.5* 34.6*  MCV 93.7 93.3  PLT 294 270   Cardiac Enzymes: No results for input(s): "CKTOTAL", "CKMB", "CKMBINDEX", "TROPONINI" in the last 168 hours.  BNP (last 3 results) Recent Labs    10/03/21 1521 09/03/22 0450 09/28/22 2029  BNP 113.0* 59.1 130.0*    ProBNP (last 3 results) No results for input(s): "PROBNP" in the last 8760 hours.  CBG: No results for input(s): "GLUCAP" in the last 168 hours.  Radiological Exams on Admission: CT Angio Chest PE W and/or Wo Contrast  Result Date: 09/28/2022 CLINICAL DATA:  Shortness of breath. Pulmonary embolism suspected. High probability. EXAM: CT ANGIOGRAPHY CHEST WITH CONTRAST TECHNIQUE: Multidetector CT imaging of the chest was performed using the standard protocol during bolus administration of intravenous contrast. Multiplanar CT image reconstructions and MIPs were obtained to evaluate the vascular  anatomy. RADIATION DOSE REDUCTION: This exam was performed according to the departmental dose-optimization program which includes automated exposure control, adjustment of the mA and/or kV according to patient size and/or use of iterative reconstruction technique. CONTRAST:  OMNIPAQUE IOHEXOL 350 MG/ML SOLN COMPARISON:  Portable chest today, portable chest 09/15/2022, portable chest 09/03/2022, CT chest without contrast 08/29/2022, CTA chest 08/21/2022 FINDINGS: Cardiovascular: There is moderate panchamber cardiomegaly. Prominent pulmonary trunk again noted 4.1 cm indicating arterial hypertension with additional prominence of the main pulmonary arteries. No arterial embolism is seen through the segmental divisions. The subsegmental arteries are poorly evaluated due to breathing motion and due to consolidation/atelectasis in the lower lobes. There is mild aortic tortuosity and atherosclerosis without aneurysm, stenosis or dissection. Minimal scattered plaque in the great vessels without stenosis in the great vessels. There are three-vessel coronary artery calcifications. No pericardial effusion. Central pulmonary veins are prominent but no more than previously. Mediastinum/Nodes: Mildly enlarged mediastinal lymph nodes appear similar to the prior studies, likely reactive adenopathy. Examples include a 1.4 cm in short axis precarinal node to the right on 4:38, 1 cm short axis right hilar lymph node on 4:42, low left paratracheal lymph node 1 cm short axis on 4:34. There are no thyroid nodules or axillary adenopathy. Thoracic esophagus is unremarkable. No tracheal or main bronchial mass is seen. There is a mucoid septation in the left main bronchus. Lungs/Pleura: There are small bilateral layering pleural effusions, both improved since last month. Consolidation in the adjacent left-greater-than-right lower lobes is still seen and similar. The consolidation is more confluent in the left lower lobe, and there are  additional patchy increased opacities today posteriorly in the left upper lobe. Central bronchi are mildly thickened. There are faint perihilar ground-glass densities in both upper lobes which could relate to low lung volumes or could be minimal ground-glass edema. No subpleural edema is seen. Remainder of the lungs are clear with no other new abnormality. Posterior right upper lobe consolidation has cleared since both prior studies. Lung apices again shown mild paraseptal emphysema. Upper Abdomen: No acute abnormality. Scattered left hepatic cysts with mild hepatic steatosis. Musculoskeletal: Osteopenia. Extensive thoracic spine bridging enthesopathy. No acute osseous findings or aggressive lesions. No significant chest wall findings. Review of the MIP images confirms the above findings. IMPRESSION: 1. No evidence of arterial embolus through the segmental divisions. The subsegmental arteries are poorly evaluated due to breathing motion and due to consolidation/atelectasis in the lower  lobes. 2. Cardiomegaly with prominent central pulmonary veins but no more than previously. 3. Aortic and coronary artery atherosclerosis. 4. Prominent pulmonary trunk and main pulmonary arteries consistent with arterial hypertension, chronic. 5. Small layering pleural effusions, both improved since last month. 6. Bilateral left-greater-than-right lower lobe consolidation/atelectasis, with more confluent consolidation in the left lower lobe and additional patchy increased opacities in the left upper lobe. Findings consistent with multifocal pneumonia. 7. Faint perihilar ground-glass densities in both upper lobes which could relate to low lung volumes or could be minimal ground-glass edema. No subpleural edema is seen. 8. Mildly enlarged mediastinal lymph nodes similar to prior studies, likely reactive. 9. Osteopenia and degenerative change. 10. Emphysema. Aortic Atherosclerosis (ICD10-I70.0) and Emphysema (ICD10-J43.9). Electronically  Signed   By: Almira Bar M.D.   On: 09/28/2022 23:36   DG Chest Port 1 View  Result Date: 09/28/2022 CLINICAL DATA:  [id number] SOB (shortness of breath) [id number] EXAM: PORTABLE CHEST 1 VIEW COMPARISON:  September 15, 2022 FINDINGS: The cardiomediastinal silhouette is unchanged and enlarged in contour. Similar appearance of bilateral pleural effusions. Unchanged elevation of LEFT hemidiaphragm. No pneumothorax. Mildly increased airspace opacity along the LEFT paramediastinal border with likely complete LEFT lower lobe atelectasis. Persistent airspace opacity along the RIGHT inferior paramediastinal border. IMPRESSION: 1. Similar appearance of bilateral pleural effusions. 2. Mildly increased airspace opacity along the LEFT paramediastinal border with likely complete LEFT lower lobe atelectasis. Electronically Signed   By: Meda Klinefelter M.D.   On: 09/28/2022 18:08    EKG: I independently viewed the EKG done and my findings are as followed: A-fib with RVR  Assessment/Plan Present on Admission:  Multifocal pneumonia  Acute on chronic respiratory failure with hypoxia and hypercarbia  Persistent atrial fibrillation with RVR (HCC)  Mixed hyperlipidemia  Obstructive sleep apnea  Principal Problem:   Multifocal pneumonia Active Problems:   Obstructive sleep apnea   Persistent atrial fibrillation with RVR (HCC)   Acute on chronic respiratory failure with hypoxia and hypercarbia   Mixed hyperlipidemia   Bilateral pleural effusion   Obesity (BMI 30-39.9)  Multifocal pneumonia POA Patient was started on vancomycin and cefepime, we shall continue same at this time with plan to de-escalate/discontinue based on blood culture, sputum culture, urine Legionella, strep pneumo and procalcitonin Continue Tylenol as needed Continue Mucinex, incentive spirometry, flutter valve   Acute on chronic respiratory failure with hypoxia Continue supplemental oxygen to maintain O2 sat > 92% and wean patient to baseline  requirement of oxygen  Bilateral pleural effusion BNP was elevated at 130, this was 59.1 on 09/03/2022 Continue total input/output, daily weights and fluid restriction Continue IV Lasix 40 mg twice daily Continue heart healthy/carb modified diet  Echocardiogram done in June 2023 showed LVEF of 55 to 60%.  No RWMA.  Mild LVH.  LV diastolic parameters indeterminate.  Echocardiogram will be done in the morning   Hypoalbuminemia possibly secondary to moderate protein calorie malnutrition Albumin 2.3, protein supplement will be provided  Obesity (BMI 39.91) Diet and lifestyle modification  Persistent atrial fibrillation with AVR Continue metoprolol, Eliquis  OSA on CPAP Continue CPAP  Next hyperlipidemia Continue Lipitor  Other home medications Breo Ellipta and Incruse ellipta   DVT prophylaxis: Eliquis  Advance Care Planning:   Code Status: Full Code.  This was confirmed with patient  Consults: None  Family Communication: None at bedside  Severity of Illness: The appropriate patient status for this patient is INPATIENT. Inpatient status is judged to be reasonable and necessary in order to provide  the required intensity of service to ensure the patient's safety. The patient's presenting symptoms, physical exam findings, and initial radiographic and laboratory data in the context of their chronic comorbidities is felt to place them at high risk for further clinical deterioration. Furthermore, it is not anticipated that the patient will be medically stable for discharge from the hospital within 2 midnights of admission.   * I certify that at the point of admission it is my clinical judgment that the patient will require inpatient hospital care spanning beyond 2 midnights from the point of admission due to high intensity of service, high risk for further deterioration and high frequency of surveillance required.*  Author: Frankey Shown, DO 09/29/2022 6:37 AM  For on call review  [email or web address].

## 2022-09-29 NOTE — Progress Notes (Signed)
Patient seen and evaluated, chart reviewed, please see EMR for updated orders. Please see full H&P dictated by admitting physician Dr. Thomes Dinning for same date of service.   Brief Summary:- 72 y.o. male with medical history significant of chronic atrial fibrillation on Eliquis, chronic diastolic CHF, OSA on CPAP, morbid obesity, chronic hypoxia on 4 L nasal cannula at baseline, hypertension, hyperlipidemia, chronic anxiety/depression admitted on 09/29/2022 with acute on chronic hypoxic respiratory failure secondary to pneumonia  A/p 1)CAP--continue vancomycin, cefepime bronchodilators and mucolytics  2) acute on chronic hypoxic respiratory failure-p--at baseline patient usually requires 3-1/2 to 4 L of oxygen -Currently requiring 5 to 6 L of oxygen via nasal cannula due to #1 above -Should improve with improvement #1 above  3)Class 2 Obesity- -Low calorie diet, portion control and increase physical activity discussed with patient -Body mass index is 39.91 kg/m.   4)OSA--- continue CPAP nightly  5)PAfib--- continue Eliquis for stroke prophylaxis and metoprolol for rate control  6)HFpEF--history of chronic diastolic dysfunction CHF with EF in the 55 to 60% range -Bilateral pleural effusions noted -BNP mildly elevated -Repeat echo pending -Continue Lasix  -Total care time is 54 minutes  Patient seen and evaluated, chart reviewed, please see EMR for updated orders. Please see full H&P dictated by admitting physician Dr. Thomes Dinning for same date of service.   Jesus Rodriguez [employer]

## 2022-09-29 NOTE — TOC Initial Note (Signed)
Transition of Care [facility]) - Initial/Assessment Note    Patient Details  Name: Jesus Rodriguez MRN: [id number] Date of Birth: March 23, 1951  [facility] CM/SW Contact:    Villa Herb, LCSWA Phone Number: 09/29/2022, 1:54 PM  Clinical Narrative:                 CSW noted per chart review that pt arrived to hospital from SNF. CSW spoke with pts son who states that pt has been at [facility] for the last 2-3 weeks. CSW inquired about interest in pts return to SNF. Pts son states that he will speak with pt about wish to return to SNF, he will speak with pt today and TOC will follow tomorrow morning. TOC to follow.   Expected Discharge Plan: Skilled Nursing Facility Barriers to Discharge: Continued Medical Work up   Patient Goals and CMS Choice Patient states their goals for this hospitalization and ongoing recovery are:: SNF CMS Medicare.gov Compare Post Acute Care list provided to:: Patient Choice offered to / list presented to : Patient      Expected Discharge Plan and Services In-house Referral: Clinical Social Work Discharge Planning Services: CM Consult Post Acute Care Choice: IP Rehab                                        Prior Living Arrangements/Services   Lives with:: Facility Resident Patient language and need for interpreter reviewed:: Yes Do you feel safe going back to the place where you live?: Yes      Need for Family Participation in Patient Care: Yes (Comment) Care giver support system in place?: Yes (comment)   Criminal Activity/Legal Involvement Pertinent to Current Situation/Hospitalization: No - Comment as needed  Activities of Daily Living Home Assistive Devices/Equipment: Oxygen, Walker (specify type) (rollator) ADL Screening (condition at time of admission) Patient's cognitive ability adequate to safely complete daily activities?: Yes Is the patient deaf or have difficulty hearing?: Yes Does the patient have difficulty seeing,  even when wearing glasses/contacts?: Yes Does the patient have difficulty concentrating, remembering, or making decisions?: No Patient able to express need for assistance with ADLs?: Yes Does the patient have difficulty dressing or bathing?: Yes Independently performs ADLs?: No Communication: Independent Dressing (OT): Needs assistance Is this a change from baseline?: Pre-admission baseline Grooming: Needs assistance Is this a change from baseline?: Pre-admission baseline Feeding: Independent Bathing: Needs assistance Is this a change from baseline?: Pre-admission baseline Toileting: Needs assistance Is this a change from baseline?: Pre-admission baseline In/Out Bed: Needs assistance Is this a change from baseline?: Pre-admission baseline Walks in Home: Needs assistance Is this a change from baseline?: Pre-admission baseline Does the patient have difficulty walking or climbing stairs?: Yes Weakness of Legs: Both Weakness of Arms/Hands: Both  Permission Sought/Granted                  Emotional Assessment Appearance:: Appears stated age     Orientation: : Oriented to Self, Oriented to Place, Oriented to  Time, Oriented to Situation Alcohol / Substance Use: Not Applicable Psych Involvement: No (comment)  Admission diagnosis:  Multifocal pneumonia [J18.9] Patient Active Problem List   Diagnosis Date Noted   Multifocal pneumonia 09/29/2022   Bilateral pleural effusion 09/29/2022   Obesity (BMI 30-39.9) 09/29/2022   Respiratory distress 09/30/2021   Acute on chronic diastolic CHF (congestive heart failure) (HCC) 09/17/2021   Acute on  chronic respiratory failure with hypoxia and hypercarbia 09/17/2021   Mixed hyperlipidemia 09/17/2021   Hypotension 11/25/2020   AKI (acute kidney injury) (HCC) 11/24/2020   Chronic respiratory failure with hypoxia (HCC) 11/24/2020   Chronic diastolic CHF (congestive heart failure) (HCC) 11/24/2020   Tobacco use disorder 11/21/2020   Mild  protein-calorie malnutrition (HCC) 11/21/2020   SBO (small bowel obstruction) (HCC) 11/20/2020   Obesity, Class III, BMI 40-49.9 (morbid obesity) (HCC)    COPD with acute exacerbation (HCC)    Acute on chronic diastolic HF (heart failure) (HCC)    Pressure injury of skin 11/28/2019   Acute respiratory failure with hypoxia (HCC) 11/27/2019   Persistent atrial fibrillation with RVR (HCC) 05/11/2018   History of rheumatoid arthritis seronegative 09/10/2017   High risk medications (not anticoagulants) long-term use 02/14/2016   Class 3 obesity in adult 02/14/2016   Depression 02/14/2016   Osteoarthritis of both hands 02/13/2016   Osteoarthritis of both knees 02/13/2016   DDD (degenerative disc disease), lumbar 02/13/2016   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 [place] [address] Phone: [phone] Fax: [phone]  [facility] [address] [place] [address] Phone: [phone] Fax: [phone]  [facility] [place], [place] - [address] [place] [address] Phone: [phone] Fax: [phone]  [facility] [place], [place] - [address] [place] [address] Phone: [phone] Fax: [phone]     Social Determinants of Health (SDOH) Social History: SDOH Screenings   Food Insecurity: No Food Insecurity (09/29/2022)  Housing: Low Risk  (09/29/2022)  Transportation Needs: No Transportation Needs (09/29/2022)  Utilities: Not At Risk (09/29/2022)  Alcohol Screen: Low Risk  (11/05/2021)  Depression (PHQ2-9): Low Risk  (12/30/2021)  Financial Resource Strain: Low Risk  (11/05/2021)  Physical Activity:  Inactive (11/05/2021)  Social Connections: Socially Isolated (11/05/2021)  Stress: No Stress Concern Present (11/05/2021)  Tobacco Use: Medium Risk (09/28/2022)   SDOH Interventions:     Readmission Risk Interventions    09/20/2021   12:37 PM 11/27/2020    1:12 PM  Readmission Risk Prevention Plan  Transportation Screening Complete Complete  PCP or Specialist Appt within 5-7 Days Not Complete Not Complete  Home Care Screening Complete Complete  Medication Review (RN CM) Complete Complete

## 2022-09-29 NOTE — [place] FL2 (Signed)
[place] MEDICAID FL2 LEVEL OF CARE FORM     IDENTIFICATION  Patient Name: Jesus Rodriguez Birthdate: 06-09-1950 Sex: male Admission Date (Current Location): 09/28/2022  [facility] and [place] Number:  [employer] and Address:  [facility],  [address], [place] [address]      Provider Number: [id number]  Attending Physician Name and Address:  Shon Hale, MD  Relative Name and Phone Number:       Current Level of Care: SNF Recommended Level of Care: Skilled Nursing Facility Prior Approval Number:    Date Approved/Denied:   PASRR Number: [id number]  Discharge Plan: SNF    Current Diagnoses: Patient Active Problem List   Diagnosis Date Noted   Multifocal pneumonia 09/29/2022   Bilateral pleural effusion 09/29/2022   Obesity (BMI 30-39.9) 09/29/2022   Respiratory distress 09/30/2021   Acute on chronic diastolic CHF (congestive heart failure) (HCC) 09/17/2021   Acute on chronic respiratory failure with hypoxia and hypercarbia 09/17/2021   Mixed hyperlipidemia 09/17/2021   Hypotension 11/25/2020   AKI (acute kidney injury) (HCC) 11/24/2020   Chronic respiratory failure with hypoxia (HCC) 11/24/2020   Chronic diastolic CHF (congestive heart failure) (HCC) 11/24/2020   Tobacco use disorder 11/21/2020   Mild protein-calorie malnutrition (HCC) 11/21/2020   SBO (small bowel obstruction) (HCC) 11/20/2020   Obesity, Class III, BMI 40-49.9 (morbid obesity) (HCC)    COPD with acute exacerbation (HCC)    Acute on chronic diastolic HF (heart failure) (HCC)    Pressure injury of skin 11/28/2019   Acute respiratory failure with hypoxia (HCC) 11/27/2019   Persistent atrial fibrillation with RVR (HCC) 05/11/2018   History of rheumatoid arthritis seronegative 09/10/2017   High risk medications (not anticoagulants) long-term use 02/14/2016   Class 3 obesity in adult 02/14/2016   Depression 02/14/2016   Osteoarthritis of both hands 02/13/2016    Osteoarthritis of both knees 02/13/2016   DDD (degenerative disc disease), lumbar 02/13/2016   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   Hypertension 02/13/2016   Elevated cholesterol 02/13/2016   Insomnia 02/13/2016   Anxiety 02/13/2016   Obstructive sleep apnea 02/13/2016   Arrhythmia 02/13/2016    Orientation RESPIRATION BLADDER Height & Weight     Self, Time, Situation, Place  O2 (see dc summary) Incontinent Weight: (!) 310 lb 13.6 oz (141 kg) Height:  6\' 2"  (188 cm)  BEHAVIORAL SYMPTOMS/MOOD NEUROLOGICAL BOWEL NUTRITION STATUS      Incontinent Diet (see dc summary)  AMBULATORY STATUS COMMUNICATION OF NEEDS Skin   Extensive Assist Verbally Normal                       Personal Care Assistance Level of Assistance    Bathing Assistance: Maximum assistance Feeding assistance: Limited assistance Dressing Assistance: Maximum assistance     Functional Limitations Info    Sight Info: Adequate Hearing Info: Adequate Speech Info: Adequate    SPECIAL CARE FACTORS FREQUENCY        PT Frequency: 5x week OT Frequency: 5x week            Contractures Contractures Info: Not present    Additional Factors Info  Code Status, Allergies Code Status Info: Full Allergies Info: Celbrex, Methotrexate Derivatives, Prednisone, Sulfa Antibiotics           Current Medications (09/29/2022):  This is the current hospital active medication list Current Facility-Administered Medications  Medication Dose Route Frequency Provider Last Rate Last Admin   acetaminophen (TYLENOL) tablet 650 mg  650  mg Oral Q6H PRN Frankey Shown, DO       Or   acetaminophen (TYLENOL) suppository 650 mg  650 mg Rectal Q6H PRN Adefeso, Oladapo, DO       albuterol (VENTOLIN HFA) 108 (90 Base) MCG/ACT inhaler 2 puff  2 puff Inhalation Q2H PRN Adefeso, Oladapo, DO       apixaban (ELIQUIS) tablet 5 mg  5 mg Oral BID Adefeso, Oladapo, DO       atorvastatin (LIPITOR) tablet 10 mg  10 mg Oral Daily Adefeso,  Oladapo, DO       ceFEPIme (MAXIPIME) 2 g in sodium chloride 0.9 % 100 mL IVPB  2 g Intravenous Q8H Daylene Posey, RPH 200 mL/hr at 09/29/22 0919 2 g at 09/29/22 0919   dextromethorphan-guaiFENesin (MUCINEX DM) 30-600 MG per 12 hr tablet 1 tablet  1 tablet Oral BID Adefeso, Oladapo, DO       feeding supplement (ENSURE ENLIVE / ENSURE PLUS) liquid 237 mL  237 mL Oral BID BM Adefeso, Oladapo, DO       fluticasone furoate-vilanterol (BREO ELLIPTA) 100-25 MCG/ACT 1 puff  1 puff Inhalation Daily Adefeso, Oladapo, DO   1 puff at 09/29/22 0805   And   umeclidinium bromide (INCRUSE ELLIPTA) 62.5 MCG/ACT 1 puff  1 puff Inhalation Daily Adefeso, Oladapo, DO   1 puff at 09/29/22 0805   furosemide (LASIX) injection 40 mg  40 mg Intravenous Q12H Adefeso, Oladapo, DO   40 mg at 09/29/22 0911   levalbuterol (XOPENEX) nebulizer solution 0.63 mg  0.63 mg Nebulization Q6H Adefeso, Oladapo, DO   0.63 mg at 09/29/22 0805   ondansetron (ZOFRAN) tablet 4 mg  4 mg Oral Q6H PRN Adefeso, Oladapo, DO       Or   ondansetron (ZOFRAN) injection 4 mg  4 mg Intravenous Q6H PRN Adefeso, Oladapo, DO       protein supplement (ENSURE MAX) liquid  11 oz Oral QHS Adefeso, Oladapo, DO       vancomycin (VANCOREADY) IVPB 1500 mg/300 mL  1,500 mg Intravenous Q12H Adefeso, Oladapo, DO         Discharge Medications: Please see discharge summary for a list of discharge medications.  Relevant Imaging Results:  Relevant Lab Results:   Additional Information    Elliot Gault, LCSW

## 2022-09-30 ENCOUNTER — Other Ambulatory Visit (HOSPITAL_COMMUNITY): Payer: Medicare Other

## 2022-09-30 DIAGNOSIS — J189 Pneumonia, unspecified organism: Secondary | ICD-10-CM | POA: Diagnosis not present

## 2022-09-30 LAB — GLUCOSE, CAPILLARY: Glucose-Capillary: 135 mg/dL — ABNORMAL HIGH (ref 70–99)

## 2022-09-30 LAB — MRSA NEXT GEN BY PCR, NASAL: MRSA by PCR Next Gen: NOT DETECTED

## 2022-09-30 MED ORDER — METOPROLOL TARTRATE 25 MG PO TABS
100.0000 mg | ORAL_TABLET | Freq: Two times a day (BID) | ORAL | Status: DC
  Administered 2022-10-01: 100 mg via ORAL
  Filled 2022-09-30 (×2): qty 4

## 2022-09-30 MED ORDER — LEVALBUTEROL HCL 0.63 MG/3ML IN NEBU
0.6300 mg | INHALATION_SOLUTION | Freq: Three times a day (TID) | RESPIRATORY_TRACT | Status: DC
  Administered 2022-10-01 (×2): 0.63 mg via RESPIRATORY_TRACT
  Filled 2022-09-30 (×2): qty 3

## 2022-09-30 MED ORDER — SODIUM CHLORIDE 0.9 % IV SOLN
2.0000 g | INTRAVENOUS | Status: DC
  Administered 2022-09-30: 2 g via INTRAVENOUS
  Filled 2022-09-30: qty 20

## 2022-09-30 MED ORDER — SODIUM CHLORIDE 0.9 % IV SOLN
500.0000 mg | INTRAVENOUS | Status: DC
  Administered 2022-09-30: 500 mg via INTRAVENOUS
  Filled 2022-09-30: qty 5

## 2022-09-30 NOTE — Plan of Care (Signed)
  Problem: Acute Rehab PT Goals(only PT should resolve) Goal: Pt Will Go Supine/Side To Sit Outcome: Progressing Flowsheets (Taken 09/30/2022 1034) Pt will go Supine/Side to Sit:  with minimal assist  with min guard assist Goal: Patient Will Transfer Sit To/From Stand Outcome: Progressing Flowsheets (Taken 09/30/2022 1034) Patient will transfer sit to/from stand: with minimal assist Goal: Pt Will Transfer Bed To Chair/Chair To Bed Outcome: Progressing Flowsheets (Taken 09/30/2022 1034) Pt will Transfer Bed to Chair/Chair to Bed: with min assist Goal: Pt Will Ambulate Outcome: Progressing Flowsheets (Taken 09/30/2022 1034) Pt will Ambulate:  25 feet  with minimal assist  with moderate assist  with rolling walker   10:34 AM, 09/30/22 Ocie Bob, MPT Physical Therapist with [facility] [phone] office [phone] mobile phone

## 2022-09-30 NOTE — Progress Notes (Signed)
PROGRESS NOTE     Jesus Rodriguez, is a 72 y.o. male, DOB - 08/07/1950, UUV:[id number]  Admit date - 09/28/2022   Admitting Physician Jesus Shown, DO  Outpatient Primary MD for the patient is System, Provider Not In  LOS - 1  Chief Complaint  Patient presents with   Shortness of Breath       Brief Summary:- 72 y.o. male with medical history significant of chronic atrial fibrillation on Eliquis, chronic diastolic CHF, OSA on CPAP, morbid obesity, chronic hypoxia on 4 L nasal cannula at baseline, hypertension, hyperlipidemia, chronic anxiety/depression admitted on 09/29/2022 with acute on chronic hypoxic respiratory failure secondary to pneumonia   A/p 1)CAP--cough and dyspnea persist  -Treated with Vancomycin and Cefepime -Okay to de-escalate to Rocephin and azithromycin C/n  bronchodilators and mucolytics   2) acute on chronic hypoxic respiratory failure-p--at baseline patient usually requires 3-1/2 to 4 L of oxygen -Currently requiring 5 to 6 L of oxygen via nasal cannula due to #1 above -Should improve with improvement #1 above   3)Class 2 Obesity- -Low calorie diet, portion control and increase physical activity discussed with patient -Body mass index is 39.91 kg/m.    4)OSA--- continue CPAP nightly   5)PAfib--- continue Eliquis for stroke prophylaxis and metoprolol for rate control   6)HFpEF--history of chronic diastolic dysfunction CHF with EF in the 55 to 60% range -Bilateral pleural effusions noted -BNP mildly elevated -Dyspnea on increased oxygen requirement persist -Repeat echo pending -Continue iv Lasix -Daily weights and fluid input and output charting as requested  7) generalized weakness and deconditioning--- physical therapy eval appreciated recommends SNF rehab  Status is: Inpatient   Disposition: The patient is from: SNF              Anticipated d/c is to: SNF              Anticipated d/c date is: 1 day              Patient currently is not  medically stable to d/c. Barriers: Not Clinically Stable-   Code Status :  -  Code Status: Full Code   Family Communication:   (patient is alert, awake and coherent)   DVT Prophylaxis  :   - SCDs   SCDs Start: 09/29/22 0425 apixaban (ELIQUIS) tablet 5 mg   Lab Results  Component Value Date   PLT 270 09/29/2022   Inpatient Medications  Scheduled Meds:  apixaban  5 mg Oral BID   atorvastatin  10 mg Oral Daily   dextromethorphan-guaiFENesin  1 tablet Oral BID   feeding supplement  237 mL Oral BID BM   fluticasone furoate-vilanterol  1 puff Inhalation Daily   And   umeclidinium bromide  1 puff Inhalation Daily   furosemide  40 mg Intravenous Q12H   levalbuterol  0.63 mg Nebulization Q6H   Ensure Max Protein  11 oz Oral QHS   Continuous Infusions:  ceFEPime (MAXIPIME) IV 2 g (09/30/22 1059)   vancomycin 1,500 mg (09/30/22 1401)   PRN Meds:.acetaminophen **OR** acetaminophen, albuterol, ondansetron **OR** ondansetron (ZOFRAN) IV   Anti-infectives (From admission, onward)    Start     Dose/Rate Route Frequency Ordered Stop   09/29/22 1400  vancomycin (VANCOREADY) IVPB 1500 mg/300 mL        1,500 mg 150 mL/hr over 120 Minutes Intravenous Every 12 hours 09/29/22 0001     09/29/22 0800  ceFEPIme (MAXIPIME) 2 g in sodium chloride 0.9 % 100 mL IVPB  2 g 200 mL/hr over 30 Minutes Intravenous Every 8 hours 09/29/22 0558     09/29/22 0015  vancomycin (VANCOREADY) IVPB 2000 mg/400 mL        2,000 mg 200 mL/hr over 120 Minutes Intravenous  Once 09/29/22 0001 09/29/22 0256   09/29/22 0000  ceFEPIme (MAXIPIME) 2 g in sodium chloride 0.9 % 100 mL IVPB        2 g 200 mL/hr over 30 Minutes Intravenous  Once 09/28/22 2357 09/29/22 0042       Subjective: Jesus Rodriguez today has no fevers, no emesis,  No chest pain,   -Cough and dyspnea persist -Oxygen requirement is higher than baseline  Objective: Vitals:   09/30/22 0706 09/30/22 1411 09/30/22 1434 09/30/22 1925  BP:   112/67    Pulse:      Resp:      Temp:  98.2 F (36.8 C)    TempSrc:  Oral    SpO2: 95% 97% 97% 93%  Weight:      Height:        Intake/Output Summary (Last 24 hours) at 09/30/2022 2006 Last data filed at 09/30/2022 1828 Gross per 24 hour  Intake 1760 ml  Output 2900 ml  Net -1140 ml   Filed Weights   09/28/22 1703 09/30/22 0500  Weight: (!) 141 kg (!) 139.5 kg    Physical Exam Gen:- Awake Alert, in no acute distress HEENT:- [place].AT, No sclera icterus Nose- [place] 4 to 5 L/min Neck-Supple Neck,No JVD,.  Lungs-diminished breath sounds with few scattered wheezes  CV- S1, S2 normal, regular  Abd-  +ve B.Sounds, Abd Soft, No tenderness, increased truncal depositing Extremity/Skin:-Chronic venous stasis, pedal pulses present  Psych-affect is appropriate, oriented x3 Neuro-generalized weakness, no new focal deficits, no tremors  Data Reviewed: I have personally reviewed following labs and imaging studies  CBC: Recent Labs  Lab 09/28/22 2029 09/29/22 0522  WBC 8.0 7.7  NEUTROABS 6.9  --   HGB 11.0* 10.8*  HCT 35.5* 34.6*  MCV 93.7 93.3  PLT 294 270   Basic Metabolic Panel: Recent Labs  Lab 09/28/22 2029 09/29/22 0522  NA 134* 134*  K 3.6 3.7  CL 91* 93*  CO2 35* 31  GLUCOSE 114* 128*  BUN 10 10  CREATININE 0.71 0.63  CALCIUM 8.4* 8.3*  MG  --  2.3  PHOS  --  3.1   GFR: Estimated Creatinine Clearance: 124.1 mL/min (by C-G formula based on SCr of 0.63 mg/dL). Liver Function Tests: Recent Labs  Lab 09/28/22 2029 09/29/22 0522  AST 16 14*  ALT 13 11  ALKPHOS 84 81  BILITOT 0.6 0.6  PROT 6.9 6.3*  ALBUMIN 2.8* 2.8*   Recent Results (from the past 240 hour(s))  Resp panel by RT-PCR (RSV, Flu A&B, Covid) Anterior Nasal Swab     Status: None   Collection Time: 09/28/22 10:10 PM   Specimen: Anterior Nasal Swab  Result Value Ref Range Status   SARS Coronavirus 2 by RT PCR NEGATIVE NEGATIVE Final    Comment: (NOTE) SARS-CoV-2 target nucleic acids are NOT  DETECTED.  The SARS-CoV-2 RNA is generally detectable in upper respiratory specimens during the acute phase of infection. The lowest concentration of SARS-CoV-2 viral copies this assay can detect is 138 copies/mL. A negative result does not preclude SARS-Cov-2 infection and should not be used as the sole basis for treatment or other patient management decisions. A negative result may occur with  improper specimen collection/handling, submission of specimen other  than nasopharyngeal swab, presence of viral mutation(s) within the areas targeted by this assay, and inadequate number of viral copies(<138 copies/mL). A negative result must be combined with clinical observations, patient history, and epidemiological information. The expected result is Negative.  Fact Sheet for Patients:  BloggerCourse.com  Fact Sheet for Healthcare Providers:  SeriousBroker.it  This test is no t yet approved or cleared by the [place] FDA and  has been authorized for detection and/or diagnosis of SARS-CoV-2 by FDA under an Emergency Use Authorization (EUA). This EUA will remain  in effect (meaning this test can be used) for the duration of the COVID-19 declaration under Section 564(b)(1) of the Act, 21 U.S.C.section 360bbb-3(b)(1), unless the authorization is terminated  or revoked sooner.       Influenza A by PCR NEGATIVE NEGATIVE Final   Influenza B by PCR NEGATIVE NEGATIVE Final    Comment: (NOTE) The Xpert Xpress SARS-CoV-2/FLU/RSV plus assay is intended as an aid in the diagnosis of influenza from Nasopharyngeal swab specimens and should not be used as a sole basis for treatment. Nasal washings and aspirates are unacceptable for Xpert Xpress SARS-CoV-2/FLU/RSV testing.  Fact Sheet for Patients: BloggerCourse.com  Fact Sheet for Healthcare Providers: SeriousBroker.it  This test is not yet  approved or cleared by the [place] FDA and has been authorized for detection and/or diagnosis of SARS-CoV-2 by FDA under an Emergency Use Authorization (EUA). This EUA will remain in effect (meaning this test can be used) for the duration of the COVID-19 declaration under Section 564(b)(1) of the Act, 21 U.S.C. section 360bbb-3(b)(1), unless the authorization is terminated or revoked.     Resp Syncytial Virus by PCR NEGATIVE NEGATIVE Final    Comment: (NOTE) Fact Sheet for Patients: BloggerCourse.com  Fact Sheet for Healthcare Providers: SeriousBroker.it  This test is not yet approved or cleared by the [place] FDA and has been authorized for detection and/or diagnosis of SARS-CoV-2 by FDA under an Emergency Use Authorization (EUA). This EUA will remain in effect (meaning this test can be used) for the duration of the COVID-19 declaration under Section 564(b)(1) of the Act, 21 U.S.C. section 360bbb-3(b)(1), unless the authorization is terminated or revoked.  Performed at [facility], [address] [place], [place] [address]   MRSA Next Gen by PCR, Nasal     Status: None   Collection Time: 09/30/22  6:41 PM   Specimen: Nasal Mucosa; Nasal Swab  Result Value Ref Range Status   MRSA by PCR Next Gen NOT DETECTED NOT DETECTED Final    Comment: (NOTE) The GeneXpert MRSA Assay (FDA approved for NASAL specimens only), is one component of a comprehensive MRSA colonization surveillance program. It is not intended to diagnose MRSA infection nor to guide or monitor treatment for MRSA infections. Test performance is not FDA approved in patients less than 32 years old. Performed at [facility], [address] [place], [place] [address]     Radiology Studies: CT Angio Chest PE W and/or Wo Contrast  Result Date: 09/28/2022 CLINICAL DATA:  Shortness of breath. Pulmonary embolism suspected. High probability. EXAM: CT ANGIOGRAPHY  CHEST WITH CONTRAST TECHNIQUE: Multidetector CT imaging of the chest was performed using the standard protocol during bolus administration of intravenous contrast. Multiplanar CT image reconstructions and MIPs were obtained to evaluate the vascular anatomy. RADIATION DOSE REDUCTION: This exam was performed according to the departmental dose-optimization program which includes automated exposure control, adjustment of the mA and/or kV according to patient size and/or use of iterative  reconstruction technique. CONTRAST:  OMNIPAQUE IOHEXOL 350 MG/ML SOLN COMPARISON:  Portable chest today, portable chest 09/15/2022, portable chest 09/03/2022, CT chest without contrast 08/29/2022, CTA chest 08/21/2022 FINDINGS: Cardiovascular: There is moderate panchamber cardiomegaly. Prominent pulmonary trunk again noted 4.1 cm indicating arterial hypertension with additional prominence of the main pulmonary arteries. No arterial embolism is seen through the segmental divisions. The subsegmental arteries are poorly evaluated due to breathing motion and due to consolidation/atelectasis in the lower lobes. There is mild aortic tortuosity and atherosclerosis without aneurysm, stenosis or dissection. Minimal scattered plaque in the great vessels without stenosis in the great vessels. There are three-vessel coronary artery calcifications. No pericardial effusion. Central pulmonary veins are prominent but no more than previously. Mediastinum/Nodes: Mildly enlarged mediastinal lymph nodes appear similar to the prior studies, likely reactive adenopathy. Examples include a 1.4 cm in short axis precarinal node to the right on 4:38, 1 cm short axis right hilar lymph node on 4:42, low left paratracheal lymph node 1 cm short axis on 4:34. There are no thyroid nodules or axillary adenopathy. Thoracic esophagus is unremarkable. No tracheal or main bronchial mass is seen. There is a mucoid septation in the left main bronchus. Lungs/Pleura: There  are small bilateral layering pleural effusions, both improved since last month. Consolidation in the adjacent left-greater-than-right lower lobes is still seen and similar. The consolidation is more confluent in the left lower lobe, and there are additional patchy increased opacities today posteriorly in the left upper lobe. Central bronchi are mildly thickened. There are faint perihilar ground-glass densities in both upper lobes which could relate to low lung volumes or could be minimal ground-glass edema. No subpleural edema is seen. Remainder of the lungs are clear with no other new abnormality. Posterior right upper lobe consolidation has cleared since both prior studies. Lung apices again Rodriguez mild paraseptal emphysema. Upper Abdomen: No acute abnormality. Scattered left hepatic cysts with mild hepatic steatosis. Musculoskeletal: Osteopenia. Extensive thoracic spine bridging enthesopathy. No acute osseous findings or aggressive lesions. No significant chest wall findings. Review of the MIP images confirms the above findings. IMPRESSION: 1. No evidence of arterial embolus through the segmental divisions. The subsegmental arteries are poorly evaluated due to breathing motion and due to consolidation/atelectasis in the lower lobes. 2. Cardiomegaly with prominent central pulmonary veins but no more than previously. 3. Aortic and coronary artery atherosclerosis. 4. Prominent pulmonary trunk and main pulmonary arteries consistent with arterial hypertension, chronic. 5. Small layering pleural effusions, both improved since last month. 6. Bilateral left-greater-than-right lower lobe consolidation/atelectasis, with more confluent consolidation in the left lower lobe and additional patchy increased opacities in the left upper lobe. Findings consistent with multifocal pneumonia. 7. Faint perihilar ground-glass densities in both upper lobes which could relate to low lung volumes or could be minimal ground-glass edema. No  subpleural edema is seen. 8. Mildly enlarged mediastinal lymph nodes similar to prior studies, likely reactive. 9. Osteopenia and degenerative change. 10. Emphysema. Aortic Atherosclerosis (ICD10-I70.0) and Emphysema (ICD10-J43.9). Electronically Signed   By: Almira Bar M.D.   On: 09/28/2022 23:36    scheduled Meds:  apixaban  5 mg Oral BID   atorvastatin  10 mg Oral Daily   dextromethorphan-guaiFENesin  1 tablet Oral BID   feeding supplement  237 mL Oral BID BM   fluticasone furoate-vilanterol  1 puff Inhalation Daily   And   umeclidinium bromide  1 puff Inhalation Daily   furosemide  40 mg Intravenous Q12H   levalbuterol  0.63 mg Nebulization  Q6H   Ensure Max Protein  11 oz Oral QHS   Continuous Infusions:  ceFEPime (MAXIPIME) IV 2 g (09/30/22 1059)   vancomycin 1,500 mg (09/30/22 1401)    LOS: 1 day   Shon Hale M.D on 09/30/2022 at 8:06 PM  Go to [email or web address] - for contact info  [facility] - Office  [phone]  If 7PM-7AM, please contact night-coverage [email or web address] 09/30/2022, 8:06 PM

## 2022-09-30 NOTE — TOC Progression Note (Signed)
Transition of Care [facility]) - Progression Note    Patient Details  Name: Jesus Rodriguez MRN: [id number] Date of Birth: Nov 12, 1950  Transition of Care [facility]) CM/SW Contact  Karn Cassis, [place] Phone Number: 09/30/2022, 11:16 AM  Clinical Narrative:  LCSW confirmed plan for pt to [facility] to [facility]. Anticipate d/c possibly tomorrow per MD. CMA to start SNF authorization. Debbie at [facility] updated.       Expected Discharge Plan: Skilled Nursing Facility Barriers to Discharge: Continued Medical Work up  Expected Discharge Plan and Services In-house Referral: Clinical Social Work Discharge Planning Services: CM Consult Post Acute Care Choice: IP Rehab                                         Social Determinants of Health (SDOH) Interventions SDOH Screenings   Food Insecurity: No Food Insecurity (09/29/2022)  Housing: Low Risk  (09/29/2022)  Transportation Needs: No Transportation Needs (09/29/2022)  Utilities: Not At Risk (09/29/2022)  Alcohol Screen: Low Risk  (11/05/2021)  Depression (PHQ2-9): Low Risk  (12/30/2021)  Financial Resource Strain: Low Risk  (11/05/2021)  Physical Activity: Inactive (11/05/2021)  Social Connections: Socially Isolated (11/05/2021)  Stress: No Stress Concern Present (11/05/2021)  Tobacco Use: Medium Risk (09/28/2022)    Readmission Risk Interventions    09/20/2021   12:37 PM 11/27/2020    1:12 PM  Readmission Risk Prevention Plan  Transportation Screening Complete Complete  PCP or Specialist Appt within 5-7 Days Not Complete Not Complete  Home Care Screening Complete Complete  Medication Review (RN CM) Complete Complete

## 2022-09-30 NOTE — Evaluation (Signed)
Physical Therapy Evaluation Patient Details Name: Jesus Rodriguez MRN: [id number] DOB: 1951/04/14 Today's Date: 09/30/2022  History of Present Illness  Jesus Rodriguez is a 72 y.o. male with medical history significant of chronic atrial fibrillation on Eliquis, chronic diastolic CHF, OSA on CPAP, morbid obesity, chronic hypoxia on 4 L nasal cannula at baseline, hypertension, hyperlipidemia, chronic anxiety/depression who presents to the emergency department due to 2-day onset of worsening shortness of breath.  He complained of productive cough of grayish-white sputum, he was placed on Levaquin at the nursing facility.  O2 sat was noted to be whorls today, so EMS was activated and patient was sent to the ED for further evaluation and management.  Patient denies fever, chills, chest pain.  Patient states that was recently admitted for acute respiratory failure and that he was placed on a ventilator.   Clinical Impression  Patient demonstrates slow labored movement for sitting up at bedside requiring HOB fully raised, poor tolerance for sitting on BSC to attempt bowel movement due to pain in scrotal area and limited to a few slow labored side steps at bedside before having to sit due to c/o fatigue/generalized weakness. Patient tolerated sitting up in chair after therapy.  Patient will benefit from continued skilled physical therapy in hospital and recommended venue below to increase strength, balance, endurance for safe ADLs and gait.          Recommendations for follow up therapy are one component of a multi-disciplinary discharge planning process, led by the attending physician.  Recommendations may be updated based on patient status, additional functional criteria and insurance authorization.  Follow Up Recommendations Can patient physically be transported by private vehicle: No     Assistance Recommended at Discharge Set up Supervision/Assistance  Patient can return home with the following  A  lot of help with walking and/or transfers;A lot of help with bathing/dressing/bathroom;Assistance with cooking/housework;Help with stairs or ramp for entrance    Equipment Recommendations None recommended by PT  Recommendations for Other Services       Functional Status Assessment Patient has had a recent decline in their functional status and demonstrates the ability to make significant improvements in function in a reasonable and predictable amount of time.     Precautions / Restrictions Precautions Precautions: Fall Restrictions Weight Bearing Restrictions: No      Mobility  Bed Mobility Overal bed mobility: Needs Assistance Bed Mobility: Supine to Sit     Supine to sit: Min assist, HOB elevated     General bed mobility comments: increased time, labored movement, required HOB fully raised    Transfers Overall transfer level: Needs assistance Equipment used: Rolling walker (2 wheels) Transfers: Sit to/from Stand, Bed to chair/wheelchair/BSC Sit to Stand: Min assist, Mod assist   Step pivot transfers: Min assist, Mod assist       General transfer comment: slow labored movement, fatigues easily    Ambulation/Gait Ambulation/Gait assistance: Mod assist Gait Distance (Feet): 5 Feet Assistive device: Rolling walker (2 wheels) Gait Pattern/deviations: Decreased step length - right, Decreased step length - left, Decreased stride length, Trunk flexed Gait velocity: decreased     General Gait Details: limited to a few slow labored side steps before having to sit due to c/o fatigue and generalized weakness  Stairs            Wheelchair Mobility    Modified Rankin (Stroke Patients Only)       Balance Overall balance assessment: Needs assistance Sitting-balance support: Feet supported, No upper extremity  supported Sitting balance-Leahy Scale: Fair Sitting balance - Comments: fair/good seated at EOB   Standing balance support: During functional activity,  Bilateral upper extremity supported Standing balance-Leahy Scale: Poor Standing balance comment: fair/poor using RW                             Pertinent Vitals/Pain Pain Assessment Pain Assessment: Faces Faces Pain Scale: Hurts little more Pain Location: scrotal area when sitting on BSC Pain Descriptors / Indicators: Grimacing, Sore, Discomfort Pain Intervention(s): Limited activity within patient's tolerance, Monitored during session, Repositioned    Home Living Family/patient expects to be discharged to:: Private residence Living Arrangements: Alone Available Help at Discharge: Available 24 hours/day;Friend(s) Type of Home: Mobile home Home Access: Ramped entrance       Home Layout: One level Home Equipment: Agricultural consultant (2 wheels);Rollator (4 wheels);BSC/3in1;Grab bars - toilet;Grab bars - tub/shower;Wheelchair - Manufacturing systems engineer      Prior Function Prior Level of Function : Independent/Modified Independent             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, drives, 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; states he has been on 4 recently ADLs Comments: assisted with IADL by family; drives; does his own medication and financial managment     Hand Dominance   Dominant Hand: Right    Extremity/Trunk Assessment   Upper Extremity Assessment Upper Extremity Assessment: Generalized weakness    Lower Extremity Assessment Lower Extremity Assessment: Generalized weakness    Cervical / Trunk Assessment Cervical / Trunk Assessment: Kyphotic  Communication   Communication: HOH  Cognition Arousal/Alertness: Awake/alert Behavior During Therapy: WFL for tasks assessed/performed Overall Cognitive Status: Within Functional Limits for tasks assessed                                          General Comments      Exercises     Assessment/Plan    PT Assessment Patient needs continued PT services  PT  Problem List Decreased strength;Decreased activity tolerance;Decreased balance;Decreased mobility       PT Treatment Interventions DME instruction;Gait training;Stair training;Functional mobility training;Therapeutic activities;Therapeutic exercise;Patient/family education;Balance training    PT Goals (Current goals can be found in the Care Plan section)  Acute Rehab PT Goals Patient Stated Goal: return home after rehab PT Goal Formulation: With patient Time For Goal Achievement: 10/14/22 Potential to Achieve Goals: Good    Frequency Min 3X/week     Co-evaluation               AM-PAC PT "6 Clicks" Mobility  Outcome Measure Help needed turning from your back to your side while in a flat bed without using bedrails?: A Little Help needed moving from lying on your back to sitting on the side of a flat bed without using bedrails?: A Little Help needed moving to and from a bed to a chair (including a wheelchair)?: A Lot Help needed standing up from a chair using your arms (e.g., wheelchair or bedside chair)?: A Lot Help needed to walk in hospital room?: A Lot Help needed climbing 3-5 steps with a railing? : Total 6 Click Score: 13    End of Session Equipment Utilized During Treatment: Oxygen Activity Tolerance: Patient tolerated treatment well;Patient limited by fatigue Patient left: in chair;with call bell/phone within reach Nurse Communication: Mobility status  PT Visit Diagnosis: Other abnormalities of gait and mobility (R26.89);Muscle weakness (generalized) (M62.81)    Time: 1610-9604 PT Time Calculation (min) (ACUTE ONLY): 25 min   Charges:   PT Evaluation $PT Eval Moderate Complexity: 1 Mod PT Treatments $Therapeutic Activity: 23-37 mins        10:33 AM, 09/30/22 Ocie Bob, MPT Physical Therapist with [facility] [phone] office [phone] mobile phone

## 2022-10-01 ENCOUNTER — Inpatient Hospital Stay (HOSPITAL_COMMUNITY): Payer: Medicare Other

## 2022-10-01 DIAGNOSIS — J189 Pneumonia, unspecified organism: Secondary | ICD-10-CM | POA: Diagnosis not present

## 2022-10-01 DIAGNOSIS — I5032 Chronic diastolic (congestive) heart failure: Secondary | ICD-10-CM | POA: Diagnosis not present

## 2022-10-01 DIAGNOSIS — J9621 Acute and chronic respiratory failure with hypoxia: Secondary | ICD-10-CM | POA: Diagnosis not present

## 2022-10-01 DIAGNOSIS — E782 Mixed hyperlipidemia: Secondary | ICD-10-CM | POA: Diagnosis not present

## 2022-10-01 DIAGNOSIS — J90 Pleural effusion, not elsewhere classified: Secondary | ICD-10-CM | POA: Diagnosis not present

## 2022-10-01 LAB — ECHOCARDIOGRAM COMPLETE
AR max vel: 3 cm2
AV Area VTI: 2.55 cm2
AV Area mean vel: 2.35 cm2
AV Mean grad: 4 mmHg
AV Peak grad: 6.9 mmHg
Ao pk vel: 1.31 m/s
Area-P 1/2: 2.95 cm2
Height: 74 in
MV VTI: 2.19 cm2
S' Lateral: 3.4 cm
Weight: 4814.85 oz

## 2022-10-01 LAB — BASIC METABOLIC PANEL
Anion gap: 9 (ref 5–15)
BUN: 10 mg/dL (ref 8–23)
CO2: 37 mmol/L — ABNORMAL HIGH (ref 22–32)
Calcium: 8.3 mg/dL — ABNORMAL LOW (ref 8.9–10.3)
Chloride: 88 mmol/L — ABNORMAL LOW (ref 98–111)
Creatinine, Ser: 0.5 mg/dL — ABNORMAL LOW (ref 0.61–1.24)
GFR, Estimated: >60 mL/min (ref >60)
Glucose, Bld: 99 mg/dL (ref 70–99)
Potassium: 3.2 mmol/L — ABNORMAL LOW (ref 3.5–5.1)
Sodium: 134 mmol/L — ABNORMAL LOW (ref 135–145)

## 2022-10-01 MED ORDER — POTASSIUM CHLORIDE CRYS ER 20 MEQ PO TBCR
40.0000 meq | EXTENDED_RELEASE_TABLET | Freq: Once | ORAL | Status: AC
  Administered 2022-10-01: 40 meq via ORAL
  Filled 2022-10-01: qty 2

## 2022-10-01 MED ORDER — GUAIFENESIN ER 600 MG PO TB12: 600.0000 mg | ORAL_TABLET | Freq: Two times a day (BID) | ORAL | 0 refills | Status: AC

## 2022-10-01 MED ORDER — TORSEMIDE 20 MG PO TABS: 60.0000 mg | ORAL_TABLET | Freq: Two times a day (BID) | ORAL | 2 refills | Status: DC

## 2022-10-01 MED ORDER — DOXYCYCLINE HYCLATE 100 MG PO CAPS: 100.0000 mg | ORAL_CAPSULE | Freq: Two times a day (BID) | ORAL | 0 refills | Status: AC

## 2022-10-01 MED ORDER — DEXTROMETHORPHAN POLISTIREX ER 30 MG/5ML PO SUER: 30.0000 mg | Freq: Two times a day (BID) | ORAL | 0 refills | Status: DC | PRN

## 2022-10-01 NOTE — TOC Transition Note (Signed)
Transition of Care [facility]) - CM/SW Discharge Note   Patient Details  Name: Jesus Rodriguez MRN: [id number] Date of Birth: 06-11-1950  Transition of [facility]) CM/SW Contact:  Karn Cassis, LCSW Phone Number: 10/01/2022, 12:58 PM   Clinical Narrative: Pt d/c today back to [facility]. Pt and facility aware and agreeable. Son, [name] notified by voicemail. RN feels pt needs EMS. Pt agreeable. LCSW to arrange with [facility]. D/C summary sent to SNF. Authorization received. RN given number to call report.       Final next level of care: Skilled Nursing Facility Barriers to Discharge: Barriers Resolved   Patient Goals and CMS Choice CMS Medicare.gov Compare Post Acute Care list provided to:: Patient Choice offered to / list presented to : Patient  Discharge Placement                Patient chooses bed at: Other - please specify in the comment section below: [facility]) Patient to be transferred to facility by: [facility] Name of family member notified: [name]- voicemail Patient and family notified of of transfer: 10/01/22  Discharge Plan and Services Additional resources added to the After Visit Summary for   In-house Referral: Clinical Social Work Discharge Planning Services: CM Consult Post Acute Care Choice: IP Rehab                               Social Determinants of Health (SDOH) Interventions SDOH Screenings   Food Insecurity: No Food Insecurity (09/29/2022)  Housing: Low Risk  (09/29/2022)  Transportation Needs: No Transportation Needs (09/29/2022)  Utilities: Not At Risk (09/29/2022)  Alcohol Screen: Low Risk  (11/05/2021)  Depression (PHQ2-9): Low Risk  (12/30/2021)  Financial Resource Strain: Low Risk  (11/05/2021)  Physical Activity: Inactive (11/05/2021)  Social Connections: Socially Isolated (11/05/2021)  Stress: No Stress Concern Present (11/05/2021)  Tobacco Use: Medium Risk (09/28/2022)     Readmission Risk Interventions     09/20/2021   12:37 PM 11/27/2020    1:12 PM  Readmission Risk Prevention Plan  Transportation Screening Complete Complete  PCP or Specialist Appt within 5-7 Days Not Complete Not Complete  Home Care Screening Complete Complete  Medication Review (RN CM) Complete Complete

## 2022-10-01 NOTE — Discharge Instructions (Signed)
IMPORTANT INFORMATION: PAY CLOSE ATTENTION   PHYSICIAN DISCHARGE INSTRUCTIONS  Follow with Primary care provider  System, Provider Not In  and other consultants as instructed by your Hospitalist Physician  SEEK MEDICAL CARE OR RETURN TO EMERGENCY ROOM IF SYMPTOMS COME BACK, WORSEN OR NEW PROBLEM DEVELOPS   Please note: You were cared for by a hospitalist during your hospital stay. Every effort will be made to forward records to your primary care provider.  You can request that your primary care provider send for your hospital records if they have not received them.  Once you are discharged, your primary care physician will handle any further medical issues. Please note that NO REFILLS for any discharge medications will be authorized once you are discharged, as it is imperative that you return to your primary care physician (or establish a relationship with a primary care physician if you do not have one) for your post hospital discharge needs so that they can reassess your need for medications and monitor your lab values.  Please get a complete blood count and chemistry panel checked by your Primary MD at your next visit, and again as instructed by your Primary MD.  Get Medicines reviewed and adjusted: Please take all your medications with you for your next visit with your Primary MD  Laboratory/radiological data: Please request your Primary MD to go over all hospital tests and procedure/radiological results at the follow up, please ask your primary care provider to get all Hospital records sent to his/her office.  In some cases, they will be blood work, cultures and biopsy results pending at the time of your discharge. Please request that your primary care provider follow up on these results.  If you are diabetic, please bring your blood sugar readings with you to your follow up appointment with primary care.    Please call and make your follow up appointments as soon as possible.    Also  Note the following: If you experience worsening of your admission symptoms, develop shortness of breath, life threatening emergency, suicidal or homicidal thoughts you must seek medical attention immediately by calling 911 or calling your MD immediately  if symptoms less severe.  You must read complete instructions/literature along with all the possible adverse reactions/side effects for all the Medicines you take and that have been prescribed to you. Take any new Medicines after you have completely understood and accpet all the possible adverse reactions/side effects.   Do not drive when taking Pain medications or sleeping medications (Benzodiazepines)  Do not take more than prescribed Pain, Sleep and Anxiety Medications. It is not advisable to combine anxiety,sleep and pain medications without talking with your primary care practitioner  Special Instructions: If you have smoked or chewed Tobacco  in the last 2 yrs please stop smoking, stop any regular Alcohol  and or any Recreational drug use.  Wear Seat belts while driving.  Do not drive if taking any narcotic, mind altering or controlled substances or recreational drugs or alcohol.       

## 2022-10-01 NOTE — Discharge Summary (Signed)
Physician Discharge Summary  Giovannie Keto UYQ:[id number] DOB: Mar 12, 1951 DOA: 09/28/2022  Pulm: Dr. Craige Cotta  Admit date: 09/28/2022 Discharge date: 10/01/2022  Disposition:  SNF   Recommendations for Outpatient Follow-up:  Follow up with PCP in 1 weeks Follow up with Dr. Craige Cotta on 10/21/22 as scheduled  Please obtain BMP in 1-2 week Please follow up on the following pending results: 2D echocardiogram results (pending at time of DC) Recommend outpatient palliative medicine consultation   Home Health:  going to SNF  Discharge Condition: STABLE   CODE STATUS: FULL DIET: low sodium heart healthy    Brief Hospitalization Summary: Please see all hospital notes, images, labs for full details of the hospitalization. ADMISSION PROVIDER HPI:  72 y.o. male with medical history significant of chronic atrial fibrillation on Eliquis, chronic diastolic CHF, OSA on CPAP, morbid obesity, chronic hypoxia on 4 L nasal cannula at baseline, hypertension, hyperlipidemia, chronic anxiety/depression who presents to the emergency department due to 2-day onset of worsening shortness of breath.  He complained of productive cough of grayish-white sputum, he was placed on Levaquin at the nursing facility.  O2 sat was noted to be whorls today, so EMS was activated and patient was sent to the ED for further evaluation and management.  Patient denies fever, chills, chest pain.  Patient states that was recently admitted for acute respiratory failure and that he was placed on a ventilator.   ED Course:  In the emergency department, respiratory rate was 35 minutes on arrival to the ED, BP was 113/58, O2 sat was 97% on 4 LPM, other vital signs are within normal range.  Workup in the ED showed normocytic anemia, BMP showed sodium 134, potassium 3.6, chloride 91, bicarb 35, blood glucose 114, BUN 10, creatinine 0.71.  BNP 130 (this was 59.1 on 09/03/2022), troponin x 2 was normal.  Influenza A, B, SARS coronavirus 2, RSV was  negative. Chest x-ray showed bilateral pleural effusions and mildly increased airspace opacity along the left paramediastinal border with likely complete left lower lobe atelectasis.  CT angiography chest with contrast was suggestive of multifocal pneumonia.  Patient was started on IV vancomycin and cefepime.  Hospitalist was asked to admit patient for further evaluation and management.  HOSPITAL COURSE BY PROBLEM   CAP--cough and dyspnea persist  -Treated with iv Vancomycin and Cefepime/then transitioned to CTX and azithromycin C/n  bronchodilators and mucolytics -DC on oral doxycycline to complete course    acute on chronic hypoxic respiratory failure--at baseline patient usually requires 3-1/2 to 4 L of oxygen -Currently back down to baseline 4 L of oxygen via nasal cannula  -RESOLVED    Class 2 Obesity- -Low calorie diet, portion control and increase physical activity discussed with patient -Body mass index is 39.91 kg/m.   OSA--- continue CPAP nightly   PAfib--- continue Eliquis for stroke prophylaxis and metoprolol for rate control   HFpEF--history of chronic diastolic dysfunction CHF with EF in the 55 to 60% range -Bilateral pleural effusions noted -BNP mildly elevated -Dyspnea on increased oxygen requirement persist -Repeat echo pending at time of discharge, follow up outpatient with PCP and pulmonary  -He was treated with IV Lasix  Intake/Output Summary (Last 24 hours) at 10/01/2022 1143 Last data filed at 10/01/2022 0930 Gross per 24 hour  Intake 1410 ml  Output 3550 ml  Net -2140 ml   Filed Weights   09/28/22 1703 09/30/22 0500 10/01/22 0500  Weight: (!) 141 kg (!) 139.5 kg (!) 136.5 kg    Generalized weakness  and deconditioning--- physical therapy eval appreciated recommends SNF rehab    Discharge Diagnoses:  Principal Problem:   Multifocal pneumonia Active Problems:   Acute on chronic respiratory failure with hypoxia and hypercarbia   Obstructive sleep  apnea   Persistent atrial fibrillation with RVR (HCC)   Mixed hyperlipidemia   Bilateral pleural effusion   Obesity (BMI 30-39.9)   Discharge Instructions:  Allergies as of 10/01/2022       Reactions   Celebrex [celecoxib]    Methotrexate Derivatives Other (See Comments)   Arrhythmia    Prednisone Other (See Comments)   Throwing up   Sulfa Antibiotics         Medication List     TAKE these medications    acetaminophen 500 MG tablet Commonly known as: TYLENOL Take 500 mg by mouth every 6 (six) hours as needed for moderate pain.   albuterol 108 (90 Base) MCG/ACT inhaler Commonly known as: VENTOLIN HFA Inhale 1 puff into the lungs every 6 (six) hours as needed. What changed: reasons to take this   albuterol (2.5 MG/3ML) 0.083% nebulizer solution Commonly known as: PROVENTIL INHALE 1 VIAL VIA NEBULIZER EVERY 6 HOURS AS NEEDED FOR WHEEZING OR SHORTNESS OF BREATH. What changed: See the new instructions.   atorvastatin 10 MG tablet Commonly known as: LIPITOR TAKE 1 TABLET BY MOUTH EVERY DAY   dextromethorphan 30 MG/5ML liquid Commonly known as: Delsym Take 5 mLs (30 mg total) by mouth 2 (two) times daily as needed for cough.   doxycycline 100 MG capsule Commonly known as: VIBRAMYCIN Take 1 capsule (100 mg total) by mouth 2 (two) times daily for 4 days.   Eliquis 5 MG Tabs tablet Generic drug: apixaban TAKE 1 TABLET BY MOUTH TWICE A DAY   Ensure Max Protein Liqd Take 330 mLs (11 oz total) by mouth at bedtime.   guaiFENesin 600 MG 12 hr tablet Commonly known as: Mucinex Take 1 tablet (600 mg total) by mouth 2 (two) times daily for 3 days.   metoprolol tartrate 100 MG tablet Commonly known as: LOPRESSOR Take 1 tablet (100 mg total) by mouth 2 (two) times daily.   torsemide 20 MG tablet Commonly known as: DEMADEX Take 3 tablets (60 mg total) by mouth 2 (two) times daily. Start taking on: October 02, 2022   Trelegy Ellipta 100-62.5-25 MCG/ACT Aepb Generic  drug: Fluticasone-Umeclidin-Vilant Inhale 1 puff into the lungs daily.   triamcinolone cream 0.1 % Commonly known as: KENALOG Apply 1 application  topically 3 (three) times daily as needed (irritation).        Follow-up Information     Primary Care Provider. Schedule an appointment as soon as possible for a visit in 1 week(s).   Why: Hospital Follow Up               Allergies  Allergen Reactions   Celebrex [Celecoxib]    Methotrexate Derivatives Other (See Comments)    Arrhythmia    Prednisone Other (See Comments)    Throwing up   Sulfa Antibiotics    Allergies as of 10/01/2022       Reactions   Celebrex [celecoxib]    Methotrexate Derivatives Other (See Comments)   Arrhythmia    Prednisone Other (See Comments)   Throwing up   Sulfa Antibiotics         Medication List     TAKE these medications    acetaminophen 500 MG tablet Commonly known as: TYLENOL Take 500 mg by mouth every 6 (six)  hours as needed for moderate pain.   albuterol 108 (90 Base) MCG/ACT inhaler Commonly known as: VENTOLIN HFA Inhale 1 puff into the lungs every 6 (six) hours as needed. What changed: reasons to take this   albuterol (2.5 MG/3ML) 0.083% nebulizer solution Commonly known as: PROVENTIL INHALE 1 VIAL VIA NEBULIZER EVERY 6 HOURS AS NEEDED FOR WHEEZING OR SHORTNESS OF BREATH. What changed: See the new instructions.   atorvastatin 10 MG tablet Commonly known as: LIPITOR TAKE 1 TABLET BY MOUTH EVERY DAY   dextromethorphan 30 MG/5ML liquid Commonly known as: Delsym Take 5 mLs (30 mg total) by mouth 2 (two) times daily as needed for cough.   doxycycline 100 MG capsule Commonly known as: VIBRAMYCIN Take 1 capsule (100 mg total) by mouth 2 (two) times daily for 4 days.   Eliquis 5 MG Tabs tablet Generic drug: apixaban TAKE 1 TABLET BY MOUTH TWICE A DAY   Ensure Max Protein Liqd Take 330 mLs (11 oz total) by mouth at bedtime.   guaiFENesin 600 MG 12 hr  tablet Commonly known as: Mucinex Take 1 tablet (600 mg total) by mouth 2 (two) times daily for 3 days.   metoprolol tartrate 100 MG tablet Commonly known as: LOPRESSOR Take 1 tablet (100 mg total) by mouth 2 (two) times daily.   torsemide 20 MG tablet Commonly known as: DEMADEX Take 3 tablets (60 mg total) by mouth 2 (two) times daily. Start taking on: October 02, 2022   Trelegy Ellipta 100-62.5-25 MCG/ACT Aepb Generic drug: Fluticasone-Umeclidin-Vilant Inhale 1 puff into the lungs daily.   triamcinolone cream 0.1 % Commonly known as: KENALOG Apply 1 application  topically 3 (three) times daily as needed (irritation).        Procedures/Studies: CT Angio Chest PE W and/or Wo Contrast  Result Date: 09/28/2022 CLINICAL DATA:  Shortness of breath. Pulmonary embolism suspected. High probability. EXAM: CT ANGIOGRAPHY CHEST WITH CONTRAST TECHNIQUE: Multidetector CT imaging of the chest was performed using the standard protocol during bolus administration of intravenous contrast. Multiplanar CT image reconstructions and MIPs were obtained to evaluate the vascular anatomy. RADIATION DOSE REDUCTION: This exam was performed according to the departmental dose-optimization program which includes automated exposure control, adjustment of the mA and/or kV according to patient size and/or use of iterative reconstruction technique. CONTRAST:  OMNIPAQUE IOHEXOL 350 MG/ML SOLN COMPARISON:  Portable chest today, portable chest 09/15/2022, portable chest 09/03/2022, CT chest without contrast 08/29/2022, CTA chest 08/21/2022 FINDINGS: Cardiovascular: There is moderate panchamber cardiomegaly. Prominent pulmonary trunk again noted 4.1 cm indicating arterial hypertension with additional prominence of the main pulmonary arteries. No arterial embolism is seen through the segmental divisions. The subsegmental arteries are poorly evaluated due to breathing motion and due to consolidation/atelectasis in the lower  lobes. There is mild aortic tortuosity and atherosclerosis without aneurysm, stenosis or dissection. Minimal scattered plaque in the great vessels without stenosis in the great vessels. There are three-vessel coronary artery calcifications. No pericardial effusion. Central pulmonary veins are prominent but no more than previously. Mediastinum/Nodes: Mildly enlarged mediastinal lymph nodes appear similar to the prior studies, likely reactive adenopathy. Examples include a 1.4 cm in short axis precarinal node to the right on 4:38, 1 cm short axis right hilar lymph node on 4:42, low left paratracheal lymph node 1 cm short axis on 4:34. There are no thyroid nodules or axillary adenopathy. Thoracic esophagus is unremarkable. No tracheal or main bronchial mass is seen. There is a mucoid septation in the left main bronchus. Lungs/Pleura:  There are small bilateral layering pleural effusions, both improved since last month. Consolidation in the adjacent left-greater-than-right lower lobes is still seen and similar. The consolidation is more confluent in the left lower lobe, and there are additional patchy increased opacities today posteriorly in the left upper lobe. Central bronchi are mildly thickened. There are faint perihilar ground-glass densities in both upper lobes which could relate to low lung volumes or could be minimal ground-glass edema. No subpleural edema is seen. Remainder of the lungs are clear with no other new abnormality. Posterior right upper lobe consolidation has cleared since both prior studies. Lung apices again shown mild paraseptal emphysema. Upper Abdomen: No acute abnormality. Scattered left hepatic cysts with mild hepatic steatosis. Musculoskeletal: Osteopenia. Extensive thoracic spine bridging enthesopathy. No acute osseous findings or aggressive lesions. No significant chest wall findings. Review of the MIP images confirms the above findings. IMPRESSION: 1. No evidence of arterial embolus  through the segmental divisions. The subsegmental arteries are poorly evaluated due to breathing motion and due to consolidation/atelectasis in the lower lobes. 2. Cardiomegaly with prominent central pulmonary veins but no more than previously. 3. Aortic and coronary artery atherosclerosis. 4. Prominent pulmonary trunk and main pulmonary arteries consistent with arterial hypertension, chronic. 5. Small layering pleural effusions, both improved since last month. 6. Bilateral left-greater-than-right lower lobe consolidation/atelectasis, with more confluent consolidation in the left lower lobe and additional patchy increased opacities in the left upper lobe. Findings consistent with multifocal pneumonia. 7. Faint perihilar ground-glass densities in both upper lobes which could relate to low lung volumes or could be minimal ground-glass edema. No subpleural edema is seen. 8. Mildly enlarged mediastinal lymph nodes similar to prior studies, likely reactive. 9. Osteopenia and degenerative change. 10. Emphysema. Aortic Atherosclerosis (ICD10-I70.0) and Emphysema (ICD10-J43.9). Electronically Signed   By: Almira Bar M.D.   On: 09/28/2022 23:36   DG Chest Port 1 View  Result Date: 09/28/2022 CLINICAL DATA:  [id number] SOB (shortness of breath) [id number] EXAM: PORTABLE CHEST 1 VIEW COMPARISON:  September 15, 2022 FINDINGS: The cardiomediastinal silhouette is unchanged and enlarged in contour. Similar appearance of bilateral pleural effusions. Unchanged elevation of LEFT hemidiaphragm. No pneumothorax. Mildly increased airspace opacity along the LEFT paramediastinal border with likely complete LEFT lower lobe atelectasis. Persistent airspace opacity along the RIGHT inferior paramediastinal border. IMPRESSION: 1. Similar appearance of bilateral pleural effusions. 2. Mildly increased airspace opacity along the LEFT paramediastinal border with likely complete LEFT lower lobe atelectasis. Electronically Signed   By: Meda Klinefelter  M.D.   On: 09/28/2022 18:08   DG Abd 1 View  Result Date: 09/04/2022 CLINICAL DATA:  Small-bowel obstruction EXAM: ABDOMEN - 1 VIEW COMPARISON:  09/03/2022 FINDINGS: Orogastric or nasogastric tube enters the stomach with the tip in the fundus. Previously administered contrast has passed almost entirely into the colon. Some persistent small bowel dilatation is noted. Findings could be consistent with resolving or partial small bowel obstruction. IMPRESSION: Previously administered contrast has passed almost entirely into the colon. Some persistent small bowel dilatation. Findings could be consistent with resolving or partial small bowel obstruction. Electronically Signed   By: Paulina Fusi M.D.   On: 09/04/2022 08:49   DG Abd Portable 1V-Small Bowel Obstruction Protocol-initial, 8 hr delay  Result Date: 09/03/2022 CLINICAL DATA:  8 hour delay. EXAM: PORTABLE ABDOMEN - 1 VIEW COMPARISON:  09/03/2022. FINDINGS: Distended loops of small bowel are noted in the abdomen measuring up to 5.1 cm. Contrast is noted in the small bowel in the pelvis. Best boy  is seen in the visualized colon. An enteric tube terminates in the stomach. There are small bilateral pleural effusions with atelectasis or infiltrate at the lung bases. The heart is enlarged. IMPRESSION: Distended loops of gas filled small bowel in the abdomen, not significantly changed from the prior exam. Contrast is seen in the small bowel in the pelvis. There is insufficient imaging of the right lower quadrant and ascending colon. Additional views are recommended. Electronically Signed   By: Thornell Sartorius M.D.   On: 09/03/2022 20:53   DG CHEST PORT 1 VIEW  Result Date: 09/03/2022 CLINICAL DATA:  Chest pain.  Hypoxia. EXAM: PORTABLE CHEST 1 VIEW COMPARISON:  Radiographs earlier the same date, 09/02/2022 and 10/03/2021. CT 08/29/2022. FINDINGS: 0947 hours. Enteric tube has been advanced, tip projecting over the gastric fundus. The side hole is not well seen on the  current study, but appears to be below the gastroesophageal junction. Right arm PICC appears unchanged near the superior cavoatrial junction. Unchanged cardiomegaly, left greater than right pleural effusions and associated left greater than right basilar airspace opacities. No evidence of pneumothorax. The bones appear unchanged. Telemetry leads overlie the chest. IMPRESSION: Interval advancement of the enteric tube, tip projecting over the gastric fundus. No other significant changes. Electronically Signed   By: Carey Bullocks M.D.   On: 09/03/2022 10:22   DG Chest Port 1 View  Result Date: 09/03/2022 CLINICAL DATA:  Small-bowel obstruction. EXAM: PORTABLE CHEST 1 VIEW COMPARISON:  Earlier radiograph dated 09/02/2022. FINDINGS: Enteric tube with tip just distal to the GE junction. The side port of the tube appears in the distal esophagus. Recommend further advancing by additional 9 cm. There is air distention of small-bowel loops. Air is noted throughout the colon. Small bilateral pleural effusions and bibasilar consolidative changes. No pneumothorax. Stable cardiac silhouette. No acute osseous pathology. IMPRESSION: 1. Enteric tube with tip in the proximal stomach. Recommend further advancing by additional 9 cm. 2. Small bilateral pleural effusions and bibasilar consolidative changes. Electronically Signed   By: Elgie Collard M.D.   On: 09/03/2022 03:07   DG Abd Portable 1V  Result Date: 09/03/2022 CLINICAL DATA:  Small-bowel obstruction. EXAM: PORTABLE CHEST 1 VIEW COMPARISON:  Earlier radiograph dated 09/02/2022. FINDINGS: Enteric tube with tip just distal to the GE junction. The side port of the tube appears in the distal esophagus. Recommend further advancing by additional 9 cm. There is air distention of small-bowel loops. Air is noted throughout the colon. Small bilateral pleural effusions and bibasilar consolidative changes. No pneumothorax. Stable cardiac silhouette. No acute osseous pathology.  IMPRESSION: 1. Enteric tube with tip in the proximal stomach. Recommend further advancing by additional 9 cm. 2. Small bilateral pleural effusions and bibasilar consolidative changes. Electronically Signed   By: Elgie Collard M.D.   On: 09/03/2022 03:07     Subjective: Pt reports he feels back to his baseline. He is requesting to go to his SNF rehab.  No specific complaints.    Discharge Exam: Vitals:   10/01/22 0805 10/01/22 0930  BP:    Pulse:    Resp:    Temp:    SpO2: 100% 99%   Vitals:   10/01/22 0500 10/01/22 0756 10/01/22 0805 10/01/22 0930  BP:      Pulse:      Resp:      Temp:      TempSrc:      SpO2:  97% 100% 99%  Weight: (!) 136.5 kg     Height:  General: Pt is alert, awake, not in acute distress Cardiovascular: normal S1/S2 +, no rubs, no gallops Respiratory: good air movement, no increased work of breathing Abdominal: Soft, NT, ND, bowel sounds + Extremities: trace pretibial edema, no cyanosis   The results of significant diagnostics from this hospitalization (including imaging, microbiology, ancillary and laboratory) are listed below for reference.     Microbiology: Recent Results (from the past 240 hour(s))  Resp panel by RT-PCR (RSV, Flu A&B, Covid) Anterior Nasal Swab     Status: None   Collection Time: 09/28/22 10:10 PM   Specimen: Anterior Nasal Swab  Result Value Ref Range Status   SARS Coronavirus 2 by RT PCR NEGATIVE NEGATIVE Final    Comment: (NOTE) SARS-CoV-2 target nucleic acids are NOT DETECTED.  The SARS-CoV-2 RNA is generally detectable in upper respiratory specimens during the acute phase of infection. The lowest concentration of SARS-CoV-2 viral copies this assay can detect is 138 copies/mL. A negative result does not preclude SARS-Cov-2 infection and should not be used as the sole basis for treatment or other patient management decisions. A negative result may occur with  improper specimen collection/handling, submission of  specimen other than nasopharyngeal swab, presence of viral mutation(s) within the areas targeted by this assay, and inadequate number of viral copies(<138 copies/mL). A negative result must be combined with clinical observations, patient history, and epidemiological information. The expected result is Negative.  Fact Sheet for Patients:  BloggerCourse.com  Fact Sheet for Healthcare Providers:  SeriousBroker.it  This test is no t yet approved or cleared by the [place] FDA and  has been authorized for detection and/or diagnosis of SARS-CoV-2 by FDA under an Emergency Use Authorization (EUA). This EUA will remain  in effect (meaning this test can be used) for the duration of the COVID-19 declaration under Section 564(b)(1) of the Act, 21 U.S.C.section 360bbb-3(b)(1), unless the authorization is terminated  or revoked sooner.       Influenza A by PCR NEGATIVE NEGATIVE Final   Influenza B by PCR NEGATIVE NEGATIVE Final    Comment: (NOTE) The Xpert Xpress SARS-CoV-2/FLU/RSV plus assay is intended as an aid in the diagnosis of influenza from Nasopharyngeal swab specimens and should not be used as a sole basis for treatment. Nasal washings and aspirates are unacceptable for Xpert Xpress SARS-CoV-2/FLU/RSV testing.  Fact Sheet for Patients: BloggerCourse.com  Fact Sheet for Healthcare Providers: SeriousBroker.it  This test is not yet approved or cleared by the [place] FDA and has been authorized for detection and/or diagnosis of SARS-CoV-2 by FDA under an Emergency Use Authorization (EUA). This EUA will remain in effect (meaning this test can be used) for the duration of the COVID-19 declaration under Section 564(b)(1) of the Act, 21 U.S.C. section 360bbb-3(b)(1), unless the authorization is terminated or revoked.     Resp Syncytial Virus by PCR NEGATIVE NEGATIVE Final     Comment: (NOTE) Fact Sheet for Patients: BloggerCourse.com  Fact Sheet for Healthcare Providers: SeriousBroker.it  This test is not yet approved or cleared by the [place] FDA and has been authorized for detection and/or diagnosis of SARS-CoV-2 by FDA under an Emergency Use Authorization (EUA). This EUA will remain in effect (meaning this test can be used) for the duration of the COVID-19 declaration under Section 564(b)(1) of the Act, 21 U.S.C. section 360bbb-3(b)(1), unless the authorization is terminated or revoked.  Performed at [facility], [address] [place], [place] [address]   MRSA Next Gen by PCR, Nasal  Status: None   Collection Time: 09/30/22  6:41 PM   Specimen: Nasal Mucosa; Nasal Swab  Result Value Ref Range Status   MRSA by PCR Next Gen NOT DETECTED NOT DETECTED Final    Comment: (NOTE) The GeneXpert MRSA Assay (FDA approved for NASAL specimens only), is one component of a comprehensive MRSA colonization surveillance program. It is not intended to diagnose MRSA infection nor to guide or monitor treatment for MRSA infections. Test performance is not FDA approved in patients less than 17 years old. Performed at [facility], [address], [place], [place] [address]      Labs: BNP (last 3 results) Recent Labs    10/03/21 1521 09/03/22 0450 09/28/22 2029  BNP 113.0* 59.1 130.0*   Basic Metabolic Panel: Recent Labs  Lab 09/28/22 2029 09/29/22 0522 10/01/22 0456  NA 134* 134* 134*  K 3.6 3.7 3.2*  CL 91* 93* 88*  CO2 35* 31 37*  GLUCOSE 114* 128* 99  BUN 10 10 10   CREATININE 0.71 0.63 0.50*  CALCIUM 8.4* 8.3* 8.3*  MG  --  2.3  --   PHOS  --  3.1  --    Liver Function Tests: Recent Labs  Lab 09/28/22 2029 09/29/22 0522  AST 16 14*  ALT 13 11  ALKPHOS 84 81  BILITOT 0.6 0.6  PROT 6.9 6.3*  ALBUMIN 2.8* 2.8*   No results for input(s): "LIPASE", "AMYLASE" in the last 168  hours. No results for input(s): "AMMONIA" in the last 168 hours. CBC: Recent Labs  Lab 09/28/22 2029 09/29/22 0522  WBC 8.0 7.7  NEUTROABS 6.9  --   HGB 11.0* 10.8*  HCT 35.5* 34.6*  MCV 93.7 93.3  PLT 294 270   Cardiac Enzymes: No results for input(s): "CKTOTAL", "CKMB", "CKMBINDEX", "TROPONINI" in the last 168 hours. BNP: Invalid input(s): "POCBNP" CBG: Recent Labs  Lab 09/30/22 2034  GLUCAP 135*   D-Dimer No results for input(s): "DDIMER" in the last 72 hours. Hgb A1c No results for input(s): "HGBA1C" in the last 72 hours. Lipid Profile No results for input(s): "CHOL", "HDL", "LDLCALC", "TRIG", "CHOLHDL", "LDLDIRECT" in the last 72 hours. Thyroid function studies No results for input(s): "TSH", "T4TOTAL", "T3FREE", "THYROIDAB" in the last 72 hours.  Invalid input(s): "FREET3" Anemia work up No results for input(s): "VITAMINB12", "FOLATE", "FERRITIN", "TIBC", "IRON", "RETICCTPCT" in the last 72 hours. Urinalysis    Component Value Date/Time   COLORURINE YELLOW 11/26/2020 1200   APPEARANCEUR CLEAR 11/26/2020 1200   LABSPEC 1.017 11/26/2020 1200   PHURINE 5.0 11/26/2020 1200   GLUCOSEU NEGATIVE 11/26/2020 1200   HGBUR NEGATIVE 11/26/2020 1200   BILIRUBINUR NEGATIVE 11/26/2020 1200   KETONESUR NEGATIVE 11/26/2020 1200   PROTEINUR NEGATIVE 11/26/2020 1200   NITRITE NEGATIVE 11/26/2020 1200   LEUKOCYTESUR NEGATIVE 11/26/2020 1200   Sepsis Labs Recent Labs  Lab 09/28/22 2029 09/29/22 0522  WBC 8.0 7.7   Microbiology Recent Results (from the past 240 hour(s))  Resp panel by RT-PCR (RSV, Flu A&B, Covid) Anterior Nasal Swab     Status: None   Collection Time: 09/28/22 10:10 PM   Specimen: Anterior Nasal Swab  Result Value Ref Range Status   SARS Coronavirus 2 by RT PCR NEGATIVE NEGATIVE Final    Comment: (NOTE) SARS-CoV-2 target nucleic acids are NOT DETECTED.  The SARS-CoV-2 RNA is generally detectable in upper respiratory specimens during the acute  phase of infection. The lowest concentration of SARS-CoV-2 viral copies this assay can detect is 138 copies/mL. A negative  result does not preclude SARS-Cov-2 infection and should not be used as the sole basis for treatment or other patient management decisions. A negative result may occur with  improper specimen collection/handling, submission of specimen other than nasopharyngeal swab, presence of viral mutation(s) within the areas targeted by this assay, and inadequate number of viral copies(<138 copies/mL). A negative result must be combined with clinical observations, patient history, and epidemiological information. The expected result is Negative.  Fact Sheet for Patients:  BloggerCourse.com  Fact Sheet for Healthcare Providers:  SeriousBroker.it  This test is no t yet approved or cleared by the [place] FDA and  has been authorized for detection and/or diagnosis of SARS-CoV-2 by FDA under an Emergency Use Authorization (EUA). This EUA will remain  in effect (meaning this test can be used) for the duration of the COVID-19 declaration under Section 564(b)(1) of the Act, 21 U.S.C.section 360bbb-3(b)(1), unless the authorization is terminated  or revoked sooner.       Influenza A by PCR NEGATIVE NEGATIVE Final   Influenza B by PCR NEGATIVE NEGATIVE Final    Comment: (NOTE) The Xpert Xpress SARS-CoV-2/FLU/RSV plus assay is intended as an aid in the diagnosis of influenza from Nasopharyngeal swab specimens and should not be used as a sole basis for treatment. Nasal washings and aspirates are unacceptable for Xpert Xpress SARS-CoV-2/FLU/RSV testing.  Fact Sheet for Patients: BloggerCourse.com  Fact Sheet for Healthcare Providers: SeriousBroker.it  This test is not yet approved or cleared by the [place] FDA and has been authorized for detection and/or diagnosis of  SARS-CoV-2 by FDA under an Emergency Use Authorization (EUA). This EUA will remain in effect (meaning this test can be used) for the duration of the COVID-19 declaration under Section 564(b)(1) of the Act, 21 U.S.C. section 360bbb-3(b)(1), unless the authorization is terminated or revoked.     Resp Syncytial Virus by PCR NEGATIVE NEGATIVE Final    Comment: (NOTE) Fact Sheet for Patients: BloggerCourse.com  Fact Sheet for Healthcare Providers: SeriousBroker.it  This test is not yet approved or cleared by the [place] FDA and has been authorized for detection and/or diagnosis of SARS-CoV-2 by FDA under an Emergency Use Authorization (EUA). This EUA will remain in effect (meaning this test can be used) for the duration of the COVID-19 declaration under Section 564(b)(1) of the Act, 21 U.S.C. section 360bbb-3(b)(1), unless the authorization is terminated or revoked.  Performed at [facility], [address] [place], [place] [address]   MRSA Next Gen by PCR, Nasal     Status: None   Collection Time: 09/30/22  6:41 PM   Specimen: Nasal Mucosa; Nasal Swab  Result Value Ref Range Status   MRSA by PCR Next Gen NOT DETECTED NOT DETECTED Final    Comment: (NOTE) The GeneXpert MRSA Assay (FDA approved for NASAL specimens only), is one component of a comprehensive MRSA colonization surveillance program. It is not intended to diagnose MRSA infection nor to guide or monitor treatment for MRSA infections. Test performance is not FDA approved in patients less than 68 years old. Performed at [facility], [address] [place], [place] [address]    Time coordinating discharge:  37 mins   SIGNED:  Standley Dakins, MD  [facility] 10/01/2022, 11:36 AM How to contact the [facility] Attending or Consulting provider 7A - 7P or covering provider during after hours 7P -7A, for this patient?  Check the care team in [facility] and look for a)  attending/consulting [facility] provider listed and b) the  [place] team listed Log into [email or web address] and use [place]'s universal password to access. If you do not have the password, please contact the hospital operator. Locate the [facility] provider you are looking for under [facility] and page to a number that you can be directly reached. If you still have difficulty reaching the provider, please page the [facility] (Director on Call) for the Hospitalists listed on amion for assistance.

## 2022-10-01 NOTE — Progress Notes (Signed)
*  PRELIMINARY RESULTS* Echocardiogram 2D Echocardiogram has been performed.  Jesus Rodriguez 10/01/2022, 1:59 PM

## 2022-10-01 NOTE — Care Management Important Message (Signed)
Important Message  Patient Details  Name: Jesus Rodriguez MRN: [id number] Date of Birth: August 07, 1950   Medicare Important Message Given:  N/A - LOS <3 / Initial given by admissions     Corey Harold 10/01/2022, 11:22 AM

## 2022-10-01 NOTE — Progress Notes (Signed)
   10/01/22 1351  Spiritual Encounters  Type of Visit Initial  Care provided to: Patient  Conversation partners present during encounter Nurse  Reason for visit Advance directives  OnCall Visit No  Spiritual Framework  Presenting Themes Significant life change;Meaning/purpose/sources of inspiration  Community/Connection Family  Patient Stress Factors Health changes;Loss of control  Interventions  Spiritual Care Interventions Made Reflective listening;Compassionate presence;Established relationship of care and support;Decision-making support/facilitation  Intervention Outcomes  Outcomes Connection to spiritual care;Autonomy/agency  Spiritual Care Plan  Spiritual Care Issues Still Outstanding Chaplain will continue to follow   Referred to patient by MSW in order to provide education regarding HPOA/Living Will. Patient was sitting upright in hospital bed finishing his lunch today. He welcomed Chaplain warmly bedside and asked questions regarding HPOA paperwork. Chaplain provided education regarding the document and he initially shared that he would like his sister to be his HCPOA. He wants to talk with her about this and complete the document with her as well. I left the AD packet with him. Chaplain provided prayer and will continue to remain available in order to provide spiritual support and to assess for spiritual need.   Rev. Jolyn Lent, M.Div Chaplain

## 2022-10-09 ENCOUNTER — Ambulatory Visit: Payer: Medicare Other | Admitting: Cardiology

## 2022-10-11 ENCOUNTER — Other Ambulatory Visit: Payer: Self-pay | Admitting: Pulmonary Disease

## 2022-10-14 ENCOUNTER — Other Ambulatory Visit: Payer: Self-pay | Admitting: Pulmonary Disease

## 2022-10-17 ENCOUNTER — Telehealth: Payer: Self-pay | Admitting: *Deleted

## 2022-10-17 MED ORDER — ALBUTEROL SULFATE (2.5 MG/3ML) 0.083% IN NEBU: 2.5000 mg | INHALATION_SOLUTION | RESPIRATORY_TRACT | 0 refills | Status: DC | PRN

## 2022-10-17 NOTE — Telephone Encounter (Signed)
Called and spoke w/ pt he verbalized understanding. NFN att. 

## 2022-10-17 NOTE — Telephone Encounter (Signed)
Patient would like a refill of proventil sent over to [facility]. Patient would like a call back once prescription is sent over [phone]

## 2022-10-21 ENCOUNTER — Ambulatory Visit: Payer: Medicare Other | Admitting: Pulmonary Disease

## 2022-10-21 ENCOUNTER — Telehealth: Payer: Self-pay

## 2022-10-21 NOTE — Telephone Encounter (Signed)
*  Pulm   PA request received for Albuterol Sulfate (2.5 MG/3ML)0.083% nebulizer solution  PA submitted to [employer] via CMM and is pending additional questions/determination  *may be covered under Medicare Part B  Key: [id number]

## 2022-10-21 NOTE — Telephone Encounter (Signed)
PA has been DENIED:   Why did we deny your request? We denied this request under Medicare Part D because: Drugs used in a nebulizer at your home are covered  under Medicare Part B. Since you are using this medication with a nebulizer in your home, it is covered under  Medicare Part B per Section 1861(n) of the Social Security Act. Your Medicare Part D drug plan cannot cover  a drug already covered by Medicare Part B

## 2022-10-22 MED ORDER — ALBUTEROL SULFATE (2.5 MG/3ML) 0.083% IN NEBU: 2.5000 mg | INHALATION_SOLUTION | RESPIRATORY_TRACT | 0 refills | Status: DC | PRN

## 2022-10-26 NOTE — Progress Notes (Signed)
Jesus Rodriguez, male    DOB: 03-30-1951    MRN: [id number]   Brief patient profile:  72  yowm quit smoking 12/2021 with restrictive changes only on pfts 2015 but placed on 02 /trelegy at time quit and  self referred to pulmonary clinic in [facility]  10/27/2022  for 02 dep resp failure with hypercabia s/p admit with dx of copd  Baseline wt 260   Admit date: 09/28/2022 Discharge date: 10/01/2022   Disposition:  SNF   Disharge Condition: STABLE   CODE STATUS: FULL DIET: low sodium heart healthy     Brief Hospitalization Summary: Please see all hospital notes, images, labs for full details of the hospitalization. ADMISSION PROVIDER HPI:  72 y.o. male with medical history significant of chronic atrial fibrillation on Eliquis, chronic diastolic CHF, OSA on CPAP, morbid obesity, chronic hypoxia on 4 L nasal cannula at baseline, hypertension, hyperlipidemia, chronic anxiety/depression who presents to the emergency department due to 2-day onset of worsening shortness of breath.  He complained of productive cough of grayish-white sputum, he was placed on Levaquin at the nursing facility.  O2 sat was noted to be worse today, so EMS was activated and patient was sent to the ED for further evaluation and management.   r.    HOSPITAL COURSE BY PROBLEM    CAP--cough and dyspnea persist  -Treated with iv Vancomycin and Cefepime/then transitioned to CTX and azithromycin C/n  bronchodilators and mucolytics -DC on oral doxycycline to complete course    acute on chronic hypoxic respiratory failure--at baseline patient usually requires 3-1/2 to 4 L of oxygen -Currently back down to baseline 4 L of oxygen via nasal cannula      Class 2 Obesity- -Low calorie diet, portion control and increase physical activity discussed with patient -Body mass index is 39.91 kg/m.   OSA--- continue BiPAP nightly   PAfib--- continue Eliquis for stroke prophylaxis and metoprolol for rate control   HFpEF--history of  chronic diastolic dysfunction CHF with EF in the 55 to 60% range -Bilateral pleural effusions noted -BNP mildly elevated -Dyspnea on increased oxygen requirement persist -Repeat echo pending at time of discharge, follow up outpatient with PCP and pulmonary  -He was treated with IV Lasix    History of Present Illness  10/27/2022  Pulmonary/ 1st office eval/ [name] / [place] [facility] re hypoxemia/hypercabia  Chief Complaint  Patient presents with   COPD    Just released from nursing home  Dyspnea:  just got of rehab one week prior to OV  p 22 days - can go  up ramp with 4lpm with rollator   Cough: none  Sleep: on bipap x 2023 per [facility]ood  SABA use: not using hfa/ neb  02: 4lpm 24/7   No obvious day to day or daytime pattern/variability or assoc  purulent sputum or mucus plugs or hemoptysis or cp or chest tightness, subjective wheeze or overt sinus or hb symptoms.    Also denies any obvious fluctuation of symptoms with weather or environmental changes or other aggravating or alleviating factors except as outlined above   No unusual exposure hx or h/o childhood pna/ asthma or knowledge of premature birth.  Current Allergies, Complete Past Medical History, Past Surgical History, Family History, and Social History were reviewed in [employer] record.  ROS  The following are not active complaints unless bolded Hoarseness, sore throat, dysphagia, dental problems, itching, sneezing,  nasal congestion or discharge of excess mucus or purulent secretions, ear ache,  fever, chills, sweats, unintended wt loss or wt gain, classically pleuritic or exertional cp,  orthopnea pnd or arm/hand swelling  or leg swelling, presyncope, palpitations, abdominal pain, anorexia, nausea, vomiting, diarrhea  or change in bowel habits or change in bladder habits, change in stools or change in urine, dysuria, hematuria,  rash, arthralgias, visual complaints, headache, numbness, weakness or ataxia or  problems with walking or coordination,  change in mood or  memory.              Outpatient Medications Prior to Visit  Medication Sig Dispense Refill   acetaminophen (TYLENOL) 500 MG tablet Take 500 mg by mouth every 6 (six) hours as needed for moderate pain.     albuterol (PROVENTIL) (2.5 MG/3ML) 0.083% nebulizer solution Take 3 mLs (2.5 mg total) by nebulization every 4 (four) hours as needed for wheezing or shortness of breath. 360 mL 0   albuterol (PROVENTIL) (2.5 MG/3ML) 0.083% nebulizer solution Take 3 mLs (2.5 mg total) by nebulization every 4 (four) hours as needed for wheezing or shortness of breath. INHALE 1 VIAL VIA NEBULIZER EVERY 6 HOURS AS NEEDED FOR WHEEZING OR SHORTNESS OF BREATH. Strength: (2.5 MG/3ML) 0.083% 360 mL 0   albuterol (VENTOLIN HFA) 108 (90 Base) MCG/ACT inhaler Inhale 1 puff into the lungs every 6 (six) hours as needed. (Patient taking differently: Inhale 1 puff into the lungs every 6 (six) hours as needed for shortness of breath or wheezing.) 8 g 5   apixaban (ELIQUIS) 5 MG TABS tablet TAKE 1 TABLET BY MOUTH TWICE A DAY 60 tablet 5   atorvastatin (LIPITOR) 10 MG tablet TAKE 1 TABLET BY MOUTH EVERY DAY 90 tablet 0   Ensure Max Protein (ENSURE MAX PROTEIN) LIQD Take 330 mLs (11 oz total) by mouth at bedtime.     Fluticasone-Umeclidin-Vilant (TRELEGY ELLIPTA) 100-62.5-25 MCG/ACT AEPB Inhale 1 puff into the lungs daily. 90 each 3   metoprolol tartrate (LOPRESSOR) 25 MG tablet Take 25 mg by mouth 2 (two) times daily.     torsemide (DEMADEX) 20 MG tablet Take 3 tablets (60 mg total) by mouth 2 (two) times daily. 540 tablet 2   triamcinolone (KENALOG) 0.1 % Apply 1 application  topically 3 (three) times daily as needed (irritation).     dextromethorphan (DELSYM) 30 MG/5ML liquid Take 5 mLs (30 mg total) by mouth 2 (two) times daily as needed for cough. (Patient not taking: Reported on 10/27/2022) 89 mL 0   metoprolol tartrate (LOPRESSOR) 100 MG tablet Take 1 tablet (100 mg  total) by mouth 2 (two) times daily. 180 tablet 3   No facility-administered medications prior to visit.    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   Sleep apnea 02/13/2016      Objective:     BP (!) 90/52   Pulse (!) 101   Ht 6\' 2"  (1.88 m)   Wt (!) 349 lb (158.3 kg)   SpO2 92% Comment: 4L nasal cannula  BMI 44.81 kg/m   SpO2: 92 % (4L nasal cannula)  Morbidly obese (by BMI)  elderly wm w/c bound    HEENT : Oropharynx  clear        NECK :  without  apparent JVD/ palpable Nodes/TM    LUNGS: no acc muscle use,  Nl contour chest which is clear to A and P bilaterally without cough on insp or exp maneuvers   CV:  RRR  no s3 or murmur or increase in P2, and no edema   ABD:  masively obese soft and nontender   MS:  Nl gait/ ext warm without deformities Or obvious joint restrictions  calf tenderness, cyanosis or clubbing    SKIN: warm and dry without lesions    NEURO:  alert, approp, nl sensorium with  no motor or cerebellar deficits apparent.     I personally reviewed images and agree with radiology impression as follows:  CXR:   pa and lat  10/01/22 1. Similar left mid and basilar lung opacities, which could represent atelectasis, aspiration, and/or pneumonia. Recommend imaging follow-up to resolution. 2. Similar small bilateral pleural effusions. 3. Similar cardiomegaly.      Assessment   No problem-specific Assessment & Plan notes found for this encounter.     Sandrea Hughs, MD 10/27/2022

## 2022-10-27 ENCOUNTER — Ambulatory Visit (INDEPENDENT_AMBULATORY_CARE_PROVIDER_SITE_OTHER): Payer: Medicare Other | Admitting: Internal Medicine

## 2022-10-27 ENCOUNTER — Encounter: Payer: Self-pay | Admitting: Internal Medicine

## 2022-10-27 VITALS — BP 90/52 | HR 101 | Ht 74.0 in | Wt 349.0 lb

## 2022-10-27 DIAGNOSIS — J9621 Acute and chronic respiratory failure with hypoxia: Secondary | ICD-10-CM | POA: Diagnosis not present

## 2022-10-27 DIAGNOSIS — G4733 Obstructive sleep apnea (adult) (pediatric): Secondary | ICD-10-CM | POA: Insufficient documentation

## 2022-10-27 DIAGNOSIS — H9190 Unspecified hearing loss, unspecified ear: Secondary | ICD-10-CM | POA: Insufficient documentation

## 2022-10-27 MED ORDER — TRELEGY ELLIPTA 100-62.5-25 MCG/ACT IN AEPB: 1.0000 | INHALATION_SPRAY | Freq: Every day | RESPIRATORY_TRACT | 11 refills | Status: AC

## 2022-10-27 NOTE — Assessment & Plan Note (Signed)
Quit smoking 2023 with baseline wt 260 - PFT's  07/29/13   FEV1 2.11 (61 % ) ratio 0.87  p 2 % improvement from saba p ? prior to study with DLCO  18.1 (51%)   and FV curve no obst and ERV 16% at wt 375    This is predominantly OHS with perhaps a small asthma component and he would be better served with a drug like symbicort 80 prn than trelegy 100 each am in terms of risk for pna but for now will continue same rx to be sure to stabliize / optimize his home situation since he just got out of hosp before tweaking his pulmonary meds  Rec No change rx Make sure you check your oxygen saturation  AT  your highest level of activity (not after you stop)   to be sure it stays over 90% and adjust  02 flow upward to maintain this level if needed but remember to turn it back to previous settings when you stop (to conserve your supply).  >>> refer to [facility] for best fit for pulmonary eval and f/u in 6 weeks with this and rollator with all meds in hand using a trust but verify approach to confirm accurate Medication  Reconciliation The principal here is that until we are certain that the  patients are doing what we've asked, it makes no sense to ask them to do more.

## 2022-10-27 NOTE — Assessment & Plan Note (Addendum)
Body mass index is 44.81 kg/m.   Lab Results  Component Value Date   TSH 1.421 11/25/2020      Contributing to  OHS/ doe and risk of GERD >>>   reviewed the need and the process to achieve and maintain neg calorie balance > defer f/u primary care including intermittently monitoring thyroid status             Each maintenance medication was reviewed in detail including emphasizing most importantly the difference between maintenance and prns and under what circumstances the prns are to be triggered using an action plan format where appropriate.  Total time for H and P, chart review, counseling, reviewing dpi device(s) and generating customized AVS unique to this transition of care  office visit / same day charting > 40 min for multiple crhronic  refractory respiratory  symptoms

## 2022-10-27 NOTE — Patient Instructions (Addendum)
My office will be contacting you by phone for referral to [facility] for best fit for portable oxygen  - if you don't hear back from my office within one week please call us back or notify us thru MyChart and we'll address it right away.    Plan A = Automatic = Always=    Trelegy 100 one click each am  Work on inhaler technique:  relax and gently blow all the way out then take a nice smooth full deep breath back in, tugging hard on the  inhaler initially then breathing it all the way in.  Hold breath in for at least  5 seconds if you can. Blow out trelegy  thru nose. Rinse and gargle with water when done.  If mouth or throat bother you at all,  try brushing teeth/gums/tongue with arm and hammer toothpaste/ make a slurry and gargle and spit out.       Plan B = Backup (to supplement plan A, not to replace it) Only use your albuterol inhaler as a rescue medication to be used if you can't catch your breath by resting or doing a relaxed purse lip breathing pattern.  - The less you use it, the better it will work when you need it. - Ok to use the inhaler up to 2 puffs  every 4 hours if you must but call for appointment if use goes up over your usual need - Don't leave home without it !!  (think of it like the spare tire for your car)   Plan C = Crisis (instead of Plan B but only if Plan B stops working) - only use your albuterol nebulizer if you first try Plan B and it fails to help > ok to use the nebulizer up to every 4 hours but if start needing it regularly call for immediate appointment  Make sure you check your oxygen saturation  AT  your highest level of activity (not after you stop)   to be sure it stays over 90% and adjust  02 flow upward to maintain this level if needed but remember to turn it back to previous settings when you stop (to conserve your supply).   Please schedule a follow up office visit in 6 weeks, call sooner if needed - bring 0xygen and rollator

## 2022-10-28 ENCOUNTER — Ambulatory Visit: Payer: Medicare Other | Admitting: Cardiology

## 2022-10-28 NOTE — Progress Notes (Deleted)
Clinical Summary Mr. Ospina is a 72 y.o.male  seen today for follow up of the following medical problems.      1. Afib and flutter - notes indicate history of typical flutter s/p CTI ablation 03/2016 - history of afib Preiviously followed by [facility] - failed sotalol, started on dofetilide, followed by EP Dr Johney Frame -prolonged QTc on dofetilide       - tried higher dose of lopressor 100mg  bid, and off benazepril. - occasional palptations, about 1-2 times per week. Lasts a few minutes.    2.Chronic diastolic HF -10/2018 echo LVEF 16-10%,      - taking torsemide 60mg  bid - office weight 09/06/19 was 396 lbs.  - home weight 388-393 lbs - compliant with diuretic   - weight down 381 to 363 from July to October - no recent edema. Chronic stable on 4L Michie constant  09/2022 admission with pneumonia, some signs of fluid overload. Received IV lasix    3. COPD - followed by Dr Craige Cotta  - on 4L home O2   4. 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      albuterol (PROVENTIL) (2.5 MG/3ML) 0.083% nebulizer solution Take 3 mLs (2.5 mg total) by nebulization every 4 (four) hours as needed for wheezing or shortness of breath. 360 mL 0   albuterol (PROVENTIL) (2.5 MG/3ML) 0.083% nebulizer solution Take 3 mLs (2.5 mg total) by nebulization every 4 (four) hours as needed for wheezing or shortness of breath. INHALE 1 VIAL VIA NEBULIZER EVERY 6 HOURS AS NEEDED FOR WHEEZING OR SHORTNESS OF BREATH. Strength: (2.5 MG/3ML) 0.083% 360 mL 0   albuterol (VENTOLIN HFA) 108 (90 Base) MCG/ACT inhaler Inhale 1 puff into the lungs every 6 (six) hours as needed. (Patient taking differently: Inhale 1 puff into the lungs every 6 (six) hours as needed for shortness of breath or wheezing.) 8 g 5   apixaban (ELIQUIS) 5 MG TABS tablet TAKE 1 TABLET BY MOUTH TWICE A DAY 60 tablet 5   atorvastatin (LIPITOR) 10 MG tablet TAKE 1 TABLET BY MOUTH EVERY DAY 90 tablet 0   dextromethorphan (DELSYM) 30 MG/5ML liquid Take 5 mLs (30 mg total) by mouth 2 (two) times daily as needed for cough. (Patient not taking: Reported on 10/27/2022) 89 mL 0   Ensure Max Protein (ENSURE MAX PROTEIN) LIQD Take 330 mLs (11 oz total) by mouth at bedtime.     Fluticasone-Umeclidin-Vilant (TRELEGY ELLIPTA) 100-62.5-25  MCG/ACT AEPB Inhale 1 puff into the lungs daily. 1 each 11   metoprolol tartrate (LOPRESSOR) 25 MG tablet Take 25 mg by mouth 2 (two) times daily.     torsemide (DEMADEX) 20 MG tablet Take 3 tablets (60 mg total) by mouth 2 (two) times daily. 540 tablet 2   triamcinolone (KENALOG) 0.1 % Apply 1 application  topically 3 (three) times daily as needed (irritation).     No current facility-administered medications for this visit.     Past Surgical History:  Procedure Laterality Date   A-FLUTTER ABLATION     at [facility] 2018   CARDIOVERSION  02/10/2018   CARDIOVERSION N/A 05/13/2018   Procedure: CARDIOVERSION;  Surgeon: Thurmon Fair, MD;  Location: [facility];  Service: Cardiovascular;   Laterality: N/A;   CATARACT EXTRACTION       Allergies  Allergen Reactions   Celebrex [Celecoxib]    Methotrexate Derivatives Other (See Comments)    Arrhythmia    Prednisone Other (See Comments)    Throwing up   Sulfa Antibiotics       Family History  Problem Relation Age of Onset   Hypertension Father    Heart attack Father    Prostate cancer Father    Hypertension Sister    Hypertension Sister    Hypertension Sister    Epilepsy Son    Diabetes Son      Social History Mr. Lussier reports that he quit smoking about 10 months ago. His smoking use included cigarettes. He started smoking about 51 years ago. He has a 101.1 pack-year smoking history. He has never been exposed to tobacco smoke. He has never used smokeless tobacco. Mr. Groot reports no history of alcohol use.   Review of Systems CONSTITUTIONAL: No weight loss, fever, chills, weakness or fatigue.  HEENT: Eyes: No visual loss, blurred vision, double vision or yellow sclerae.No hearing loss, sneezing, congestion, runny nose or sore throat.  SKIN: No rash or itching.  CARDIOVASCULAR:  RESPIRATORY: No shortness of breath, cough or sputum.  GASTROINTESTINAL: No anorexia, nausea, vomiting or diarrhea. No abdominal pain or blood.  GENITOURINARY: No burning on urination, no polyuria NEUROLOGICAL: No headache, dizziness, syncope, paralysis, ataxia, numbness or tingling in the extremities. No change in bowel or bladder control.  MUSCULOSKELETAL: No muscle, back pain, joint pain or stiffness.  LYMPHATICS: No enlarged nodes. No history of splenectomy.  PSYCHIATRIC: No history of depression or anxiety.  ENDOCRINOLOGIC: No reports of sweating, cold or heat intolerance. No polyuria or polydipsia.  Marland Kitchen   Physical Examination There were no vitals filed for this visit. There were no vitals filed for this visit.  Gen: resting comfortably, no acute distress HEENT: no scleral icterus, pupils equal round and reactive, no  palptable cervical adenopathy,  CV Resp: Clear to auscultation bilaterally GI: abdomen is soft, non-tender, non-distended, normal bowel sounds, no hepatosplenomegaly MSK: extremities are warm, no edema.  Skin: warm, no rash Neuro:  no focal deficits Psych: appropriate affect   Diagnostic Studies 10/2018 echo  1. Images are limited.  2. The left ventricle has grossly normal systolic function, with an ejection fraction of 55-60%. The cavity size was normal. There is mildly increased left ventricular wall thickness. Left ventricular diastolic Doppler parameters are indeterminate.  3. The right ventricle has normal systolic function. The cavity was normal. There is no increase in right ventricular wall thickness. Right ventricular systolic pressure is normal with an estimated pressure of 12.5 mmHg.  4. The aortic valve is tricuspid. Mild calcification  of the aortic valve. Mild aortic annular calcification noted.  5. The mitral valve is grossly normal. There is mild mitral annular calcification present.  6. The tricuspid valve is grossly normal.  7. The aorta is abnormal in size and structure.  8. There is mild dilatation of the aortic root.  9. The interatrial septum was not well visualized.    Assessment and Plan   1.Afib/aflutter - no symptoms, continue current meds - interested in changing coumadin to DOAC, will stop coumad in wait 3 days then start eliquis 5mg  bid   2. Chronic diastolic HF - euvolemic today, continue torsemide.    3. HTN - off benazepril due to low bp's, required higher dosing of lopressor.      Antoine Poche, M.D., F.A.C.C.

## 2022-10-30 ENCOUNTER — Other Ambulatory Visit: Payer: Self-pay | Admitting: Cardiology

## 2022-11-03 ENCOUNTER — Telehealth: Payer: Self-pay | Admitting: Cardiology

## 2022-11-03 NOTE — Telephone Encounter (Signed)
Increase fluids in legs and some sob. Calling to see in fluids pills need to be increase until appt. Please advise

## 2022-11-03 NOTE — Telephone Encounter (Signed)
Addressed appointment scheduled

## 2022-11-03 NOTE — Telephone Encounter (Signed)
If taking torsemide 60mg  bid increase to 80mg  bid, please see me in clinic this Thursday at 220pm   Dominga Ferry MD

## 2022-11-06 ENCOUNTER — Ambulatory Visit: Payer: Medicare Other | Attending: Cardiology | Admitting: Cardiology

## 2022-11-06 ENCOUNTER — Ambulatory Visit: Payer: Medicare Other | Admitting: Cardiology

## 2022-11-06 ENCOUNTER — Encounter: Payer: Self-pay | Admitting: Cardiology

## 2022-11-06 VITALS — BP 114/76 | HR 80 | Ht 74.0 in | Wt 341.0 lb

## 2022-11-06 DIAGNOSIS — Z79899 Other long term (current) drug therapy: Secondary | ICD-10-CM

## 2022-11-06 DIAGNOSIS — I5033 Acute on chronic diastolic (congestive) heart failure: Secondary | ICD-10-CM | POA: Diagnosis not present

## 2022-11-06 DIAGNOSIS — I4891 Unspecified atrial fibrillation: Secondary | ICD-10-CM | POA: Diagnosis not present

## 2022-11-06 MED ORDER — TORSEMIDE 20 MG PO TABS: 80.0000 mg | ORAL_TABLET | Freq: Two times a day (BID) | ORAL | 11 refills | Status: DC

## 2022-11-06 NOTE — Patient Instructions (Signed)
Medication Instructions:  Your physician recommends that you continue on your current medications as directed. Please refer to the Current Medication list given to you today.  Take Torsemide 80 mg Two Times Daily   *If you need a refill on your cardiac medications before your next appointment, please call your pharmacy*   Lab Work: Your physician recommends that you return for lab work in: Next Week ( Magnesium, BMET, BNP)    If you have labs (blood work) drawn today and your tests are completely normal, you will receive your results only by: MyChart Message (if you have MyChart) OR A paper copy in the mail If you have any lab test that is abnormal or we need to change your treatment, we will call you to review the results.   Testing/Procedures: NONE    Follow-Up: At [facility], you and your health needs are our priority.  As part of our continuing mission to provide you with exceptional heart care, we have created designated Provider Care Teams.  These Care Teams include your primary Cardiologist (physician) and Advanced Practice Providers (APPs -  Physician Assistants and Nurse Practitioners) who all work together to provide you with the care you need, when you need it.  We recommend signing up for the patient portal called "MyChart".  Sign up information is provided on this After Visit Summary.  MyChart is used to connect with patients for Virtual Visits (Telemedicine).  Patients are able to view lab/test results, encounter notes, upcoming appointments, etc.  Non-urgent messages can be sent to your provider as well.   To learn more about what you can do with MyChart, go to [email or web address].    Your next appointment:    As Scheduled   Provider:   Sharlene Dory, NP   Other Instructions Thank you for choosing [facility]!

## 2022-11-06 NOTE — Progress Notes (Signed)
Clinical Summary Jesus Rodriguez is a 72 y.o.male seen today for follow up of the following medical problems.      1. Afib and flutter - notes indicate history of typical flutter s/p CTI ablation 03/2016 - history of afib Preiviously followed by [facility] - failed sotalol, started on dofetilide, followed by EP Jesus Rodriguez -prolonged QTc on dofetilide now off    -rare palpitations with activity - compliant with meds. No bleeding on eliquis.    2.Chronic diastolic HF 09/2022 echo: LVEF 82-95%, indet diastolic, mod pulm HTN -phone call 7/19 reported weight gain and leg swelling - 09/30/22 admission weight down to 308 lbs - home weight late June in rehab was 317 lbs - he has not been checking home weights - weight in clinic today 341 lbs, reports increased LE edema with leg weeping.  -torsemide was increased to 80mg  bid few days ago, swelling is starting to improve - chronic SOB/DOE somewhat increased.       3. COPD - followed by Jesus Rodriguez  -admit 08/2022 with respiratory failure, intubated.  - intubated 8 days. Admission complicated by SBO managed conservatively.      4. 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     albuterol (PROVENTIL) (2.5 MG/3ML) 0.083% nebulizer solution Take 3 mLs (2.5 mg total) by nebulization every 4 (four) hours as needed for wheezing or shortness of breath. 360 mL 0   albuterol (PROVENTIL) (2.5 MG/3ML) 0.083% nebulizer solution Take 3 mLs (2.5 mg total) by nebulization every 4 (four) hours as needed for wheezing or shortness of breath. INHALE 1 VIAL VIA NEBULIZER EVERY 6 HOURS AS NEEDED FOR WHEEZING OR SHORTNESS OF BREATH. Strength: (2.5 MG/3ML) 0.083% 360 mL 0   albuterol (VENTOLIN HFA) 108 (90 Base) MCG/ACT inhaler Inhale 1 puff into the lungs every 6 (six) hours as needed. (Patient taking differently: Inhale 1 puff into the lungs every 6 (six) hours as needed for shortness of breath or wheezing.) 8 g 5   apixaban (ELIQUIS) 5 MG TABS tablet TAKE 1 TABLET BY MOUTH TWICE A DAY 60 tablet 5   atorvastatin (LIPITOR) 10 MG tablet TAKE 1 TABLET BY MOUTH EVERY DAY 30 tablet 0   dextromethorphan (DELSYM) 30 MG/5ML liquid Take 5 mLs (30 mg total) by mouth 2 (two) times daily as needed for cough. (Patient not taking: Reported on 10/27/2022) 89 mL 0   Ensure Max Protein (ENSURE MAX PROTEIN) LIQD Take 330 mLs (11 oz total) by mouth at bedtime.  Fluticasone-Umeclidin-Vilant (TRELEGY ELLIPTA) 100-62.5-25 MCG/ACT AEPB Inhale 1 puff into the lungs daily. 1 each 11   metoprolol tartrate (LOPRESSOR) 25 MG tablet Take 25 mg by mouth 2 (two) times daily.     torsemide (DEMADEX) 20 MG tablet Take 3 tablets (60 mg total) by mouth 2 (two) times daily. 540 tablet 2   triamcinolone (KENALOG) 0.1 % Apply 1 application  topically 3 (three) times daily as needed (irritation).     No current facility-administered medications for this visit.     Past Surgical History:  Procedure Laterality Date   A-FLUTTER ABLATION     at [facility] 2018   CARDIOVERSION  02/10/2018   CARDIOVERSION N/A 05/13/2018   Procedure:  CARDIOVERSION;  Surgeon: Jesus Fair, MD;  Location: [facility];  Service: Cardiovascular;  Laterality: N/A;   CATARACT EXTRACTION       Allergies  Allergen Reactions   Celebrex [Celecoxib]    Methotrexate Derivatives Other (See Comments)    Arrhythmia    Prednisone Other (See Comments)    Throwing up   Sulfa Antibiotics       Family History  Problem Relation Age of Onset   Hypertension Father    Heart attack Father    Prostate cancer Father    Hypertension Sister    Hypertension Sister    Hypertension Sister    Epilepsy Son    Diabetes Son      Social History Jesus Rodriguez reports that he quit smoking about 10 months ago. His smoking use included cigarettes. He started smoking about 51 years ago. He has a 101.1 pack-year smoking history. He has never been exposed to tobacco smoke. He has never used smokeless tobacco. Jesus Rodriguez reports no history of alcohol use.   Review of Systems CONSTITUTIONAL: No weight loss, fever, chills, weakness or fatigue.  HEENT: Eyes: No visual loss, blurred vision, double vision or yellow sclerae.No hearing loss, sneezing, congestion, runny nose or sore throat.  SKIN: No rash or itching.  CARDIOVASCULAR: per hpi RESPIRATORY: per hpi GASTROINTESTINAL: No anorexia, nausea, vomiting or diarrhea. No abdominal pain or blood.  GENITOURINARY: No burning on urination, no polyuria NEUROLOGICAL: No headache, dizziness, syncope, paralysis, ataxia, numbness or tingling in the extremities. No change in bowel or bladder control.  MUSCULOSKELETAL: No muscle, back pain, joint pain or stiffness.  LYMPHATICS: No enlarged nodes. No history of splenectomy.  PSYCHIATRIC: No history of depression or anxiety.  ENDOCRINOLOGIC: No reports of sweating, cold or heat intolerance. No polyuria or polydipsia.  Marland Kitchen   Physical Examination Today's Vitals   11/06/22 1049  BP: 114/76  Pulse: 80  SpO2: 93%  Weight: (!) 341 lb (154.7 kg)  Height: 6\' 2"  (1.88 m)    Body mass index is 43.78 kg/m.  Gen: resting comfortably, no acute distress HEENT: no scleral icterus, pupils equal round and reactive, no palptable cervical adenopathy,  CV: irreg, no m/rg, no jvd Resp: faint crackles bilateral bases GI: abdomen is soft, non-tender, non-distended, normal bowel sounds, no hepatosplenomegaly MSK: 1+ bilateral LE edema Skin: warm, no rash Neuro:  no focal deficits Psych: appropriate affect   Diagnostic Studies  10/2018 echo  1. Images are limited.  2. The left ventricle has grossly normal systolic function, with an ejection fraction of 55-60%. The cavity size was normal. There is mildly increased left ventricular wall thickness. Left ventricular diastolic Doppler parameters are indeterminate.  3. The right ventricle has normal systolic function. The cavity was normal. There is no increase in right ventricular wall  thickness. Right ventricular systolic pressure is normal with an estimated pressure of 12.5 mmHg.  4. The aortic valve is tricuspid. Mild calcification of the aortic valve. Mild aortic annular calcification noted.  5. The mitral valve is grossly normal. There is mild mitral annular calcification present.  6. The tricuspid valve is grossly normal.  7. The aorta is abnormal in size and structure.  8. There is mild dilatation of the aortic root.  9. The interatrial septum was not well visualized.   Assessment and Plan  1.Afib/aflutter/acquired thrombophilia - no symptoms, continue current meds including eliquis for stroke prevention   2. Acute on chronic diastolic HF -weight up 317 late June to 341 lbs in clinic today. Reported increased LE edema - we had increased his torsemide to 80mg  bid few days ago over the phone. He report swelling is improving, does not have home scale - continue torsemide 80mg  bid, check bmet/mg/bnp next week. Keep Aug 5 appt with NP Philis Nettle to reassess weights and diuresis. At that appt if labs stabel would start  SGLT2i in setting of diastolic HF.         Antoine Poche, M.D.

## 2022-11-17 ENCOUNTER — Ambulatory Visit: Payer: Medicare Other | Admitting: Nurse Practitioner

## 2022-12-06 NOTE — Progress Notes (Unsigned)
Jesus Rodriguez, male    DOB: 13-Jan-1951    MRN: [id number]   Brief patient profile:  72  yowm quit smoking 12/2021 with restrictive changes only on pfts 2015 but placed on 02 /trelegy at time quit and  self referred to pulmonary clinic in [facility]  10/27/2022  for 02 dep resp failure with hypercabia s/p admit with dx of copd  Baseline wt 260   Admit date: 09/28/2022 Discharge date: 10/01/2022   Disposition:  SNF   Disharge Condition: STABLE   CODE STATUS: FULL DIET: low sodium heart healthy     Brief Hospitalization Summary: Please see all hospital notes, images, labs for full details of the hospitalization. ADMISSION PROVIDER HPI:  72 y.o. male with medical history significant of chronic atrial fibrillation on Eliquis, chronic diastolic CHF, OSA on CPAP, morbid obesity, chronic hypoxia on 4 L nasal cannula at baseline, hypertension, hyperlipidemia, chronic anxiety/depression who presents to the emergency department due to 2-day onset of worsening shortness of breath.  He complained of productive cough of grayish-white sputum, he was placed on Levaquin at the nursing facility.  O2 sat was noted to be worse today, so EMS was activated and patient was sent to the ED for further evaluation and management.   r.    HOSPITAL COURSE BY PROBLEM    CAP--cough and dyspnea persist  -Treated with iv Vancomycin and Cefepime/then transitioned to CTX and azithromycin C/n  bronchodilators and mucolytics -DC on oral doxycycline to complete course    acute on chronic hypoxic respiratory failure--at baseline patient usually requires 3-1/2 to 4 L of oxygen -Currently back down to baseline 4 L of oxygen via nasal cannula      Class 2 Obesity- -Low calorie diet, portion control and increase physical activity discussed with patient -Body mass index is 39.91 kg/m.   OSA--- continue BiPAP nightly   PAfib--- continue Eliquis for stroke prophylaxis and metoprolol for rate control   HFpEF--history of  chronic diastolic dysfunction CHF with EF in the 55 to 60% range -Bilateral pleural effusions noted -BNP mildly elevated -Dyspnea on increased oxygen requirement persist -Repeat echo pending at time of discharge, follow up outpatient with PCP and pulmonary  -He was treated with IV Lasix    History of Present Illness  10/27/2022  Pulmonary/ 1st office eval/ [name] / [place] [facility] re hypoxemia/hypercabia  Chief Complaint  Patient presents with   COPD    Just released from nursing home  Dyspnea:  just got of rehab one week prior to OV  p 22 days - can go  up ramp with 4lpm with rollator   Cough: none  Sleep: on bipap x 2023 per Sood  SABA use: not using hfa/ neb  02: 4lpm 24/7  Rec My office will be contacting you by phone for referral to [employer] for best fit for portable oxygen  Plan A = Automatic = Always=    Trelegy 100 one click each am Work on inhaler technique:   Plan B = Backup (to supplement plan A, not to replace it) Only use your albuterol inhaler as a rescue medication  Plan C = Crisis (instead of Plan B but only if Plan B stops working) - only use your albuterol nebulizer if you first try Plan B and it fails to help > ok to use the nebulizer up to every 4 hours but if start needing it regularly call for immediate appointment Make sure you check your oxygen saturation  AT  your highest level of  activity (not after you stop)   to be sure it stays over 90% Please schedule a follow up office visit in 6 weeks, call sooner if needed - bring 0xygen and rollator     12/08/2022  f/u ov/[place] office/Nakiah Osgood re: *** maint on *** did *** bring 02 and rollator No chief complaint on file.   Dyspnea:  *** Cough: *** Sleeping: ***   resp cc  SABA use: *** 02: ***  Lung cancer screening: ***   No obvious day to day or daytime variability or assoc excess/ purulent sputum or mucus plugs or hemoptysis or cp or chest tightness, subjective wheeze or overt sinus or hb symptoms.     Also denies any obvious fluctuation of symptoms with weather or environmental changes or other aggravating or alleviating factors except as outlined above   No unusual exposure hx or h/o childhood pna/ asthma or knowledge of premature birth.  Current Allergies, Complete Past Medical History, Past Surgical History, Family History, and Social History were reviewed in [employer] record.  ROS  The following are not active complaints unless bolded Hoarseness, sore throat, dysphagia, dental problems, itching, sneezing,  nasal congestion or discharge of excess mucus or purulent secretions, ear ache,   fever, chills, sweats, unintended wt loss or wt gain, classically pleuritic or exertional cp,  orthopnea pnd or arm/hand swelling  or leg swelling, presyncope, palpitations, abdominal pain, anorexia, nausea, vomiting, diarrhea  or change in bowel habits or change in bladder habits, change in stools or change in urine, dysuria, hematuria,  rash, arthralgias, visual complaints, headache, numbness, weakness or ataxia or problems with walking or coordination,  change in mood or  memory.        No outpatient medications have been marked as taking for the 12/08/22 encounter (Appointment) with [name], MD.            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 Similar left mid and basilar lung opacities, which could represent atelectasis, aspiration, and/or pneumonia. Recommend imaging follow-up to resolution. 2. Similar small bilateral pleural effusions. 3. Similar cardiomegaly.      Assessment

## 2022-12-08 ENCOUNTER — Inpatient Hospital Stay (HOSPITAL_COMMUNITY)
Admission: EM | Admit: 2022-12-08 | Discharge: 2022-12-11 | DRG: 291 | Disposition: A | Discharge status: Home / Self Care | Payer: Medicare Other | Attending: Internal Medicine | Admitting: Internal Medicine

## 2022-12-08 ENCOUNTER — Ambulatory Visit (INDEPENDENT_AMBULATORY_CARE_PROVIDER_SITE_OTHER): Payer: Medicare Other | Admitting: Internal Medicine

## 2022-12-08 ENCOUNTER — Encounter (HOSPITAL_COMMUNITY): Payer: Self-pay | Admitting: Emergency Medicine

## 2022-12-08 ENCOUNTER — Emergency Department (HOSPITAL_COMMUNITY): Payer: Medicare Other

## 2022-12-08 ENCOUNTER — Other Ambulatory Visit: Payer: Self-pay

## 2022-12-08 ENCOUNTER — Encounter: Payer: Self-pay | Admitting: Internal Medicine

## 2022-12-08 VITALS — BP 96/57 | HR 89

## 2022-12-08 DIAGNOSIS — G4733 Obstructive sleep apnea (adult) (pediatric): Secondary | ICD-10-CM | POA: Diagnosis present

## 2022-12-08 DIAGNOSIS — I4819 Other persistent atrial fibrillation: Secondary | ICD-10-CM | POA: Diagnosis present

## 2022-12-08 DIAGNOSIS — J9621 Acute and chronic respiratory failure with hypoxia: Secondary | ICD-10-CM | POA: Diagnosis present

## 2022-12-08 DIAGNOSIS — I890 Lymphedema, not elsewhere classified: Secondary | ICD-10-CM | POA: Diagnosis present

## 2022-12-08 DIAGNOSIS — M069 Rheumatoid arthritis, unspecified: Secondary | ICD-10-CM | POA: Diagnosis present

## 2022-12-08 DIAGNOSIS — J9811 Atelectasis: Secondary | ICD-10-CM | POA: Diagnosis present

## 2022-12-08 DIAGNOSIS — I872 Venous insufficiency (chronic) (peripheral): Secondary | ICD-10-CM | POA: Diagnosis present

## 2022-12-08 DIAGNOSIS — E876 Hypokalemia: Secondary | ICD-10-CM

## 2022-12-08 DIAGNOSIS — W010XXA Fall on same level from slipping, tripping and stumbling without subsequent striking against object, initial encounter: Secondary | ICD-10-CM | POA: Diagnosis present

## 2022-12-08 DIAGNOSIS — Z8249 Family history of ischemic heart disease and other diseases of the circulatory system: Secondary | ICD-10-CM

## 2022-12-08 DIAGNOSIS — Z820 Family history of epilepsy and other diseases of the nervous system: Secondary | ICD-10-CM

## 2022-12-08 DIAGNOSIS — R0902 Hypoxemia: Secondary | ICD-10-CM

## 2022-12-08 DIAGNOSIS — Z1152 Encounter for screening for COVID-19: Secondary | ICD-10-CM

## 2022-12-08 DIAGNOSIS — S50812A Abrasion of left forearm, initial encounter: Secondary | ICD-10-CM | POA: Diagnosis present

## 2022-12-08 DIAGNOSIS — L309 Dermatitis, unspecified: Secondary | ICD-10-CM | POA: Diagnosis present

## 2022-12-08 DIAGNOSIS — S50811A Abrasion of right forearm, initial encounter: Secondary | ICD-10-CM | POA: Diagnosis present

## 2022-12-08 DIAGNOSIS — I110 Hypertensive heart disease with heart failure: Secondary | ICD-10-CM | POA: Diagnosis not present

## 2022-12-08 DIAGNOSIS — F32A Depression, unspecified: Secondary | ICD-10-CM | POA: Diagnosis present

## 2022-12-08 DIAGNOSIS — Z8042 Family history of malignant neoplasm of prostate: Secondary | ICD-10-CM

## 2022-12-08 DIAGNOSIS — Z9981 Dependence on supplemental oxygen: Secondary | ICD-10-CM

## 2022-12-08 DIAGNOSIS — F419 Anxiety disorder, unspecified: Secondary | ICD-10-CM | POA: Diagnosis present

## 2022-12-08 DIAGNOSIS — I480 Paroxysmal atrial fibrillation: Secondary | ICD-10-CM | POA: Insufficient documentation

## 2022-12-08 DIAGNOSIS — J9622 Acute and chronic respiratory failure with hypercapnia: Secondary | ICD-10-CM | POA: Diagnosis present

## 2022-12-08 DIAGNOSIS — Z87891 Personal history of nicotine dependence: Secondary | ICD-10-CM

## 2022-12-08 DIAGNOSIS — Z888 Allergy status to other drugs, medicaments and biological substances status: Secondary | ICD-10-CM

## 2022-12-08 DIAGNOSIS — E440 Moderate protein-calorie malnutrition: Secondary | ICD-10-CM | POA: Diagnosis present

## 2022-12-08 DIAGNOSIS — Z6841 Body Mass Index (BMI) 40.0 and over, adult: Secondary | ICD-10-CM

## 2022-12-08 DIAGNOSIS — Z8549 Personal history of malignant neoplasm of other male genital organs: Secondary | ICD-10-CM

## 2022-12-08 DIAGNOSIS — Y9301 Activity, walking, marching and hiking: Secondary | ICD-10-CM | POA: Diagnosis present

## 2022-12-08 DIAGNOSIS — E66813 Obesity, class 3: Secondary | ICD-10-CM | POA: Diagnosis present

## 2022-12-08 DIAGNOSIS — J449 Chronic obstructive pulmonary disease, unspecified: Secondary | ICD-10-CM | POA: Diagnosis present

## 2022-12-08 DIAGNOSIS — Z833 Family history of diabetes mellitus: Secondary | ICD-10-CM

## 2022-12-08 DIAGNOSIS — I5033 Acute on chronic diastolic (congestive) heart failure: Secondary | ICD-10-CM | POA: Diagnosis present

## 2022-12-08 DIAGNOSIS — E8809 Other disorders of plasma-protein metabolism, not elsewhere classified: Secondary | ICD-10-CM | POA: Diagnosis present

## 2022-12-08 DIAGNOSIS — Z7901 Long term (current) use of anticoagulants: Secondary | ICD-10-CM

## 2022-12-08 DIAGNOSIS — Z79899 Other long term (current) drug therapy: Secondary | ICD-10-CM

## 2022-12-08 DIAGNOSIS — E782 Mixed hyperlipidemia: Secondary | ICD-10-CM | POA: Diagnosis present

## 2022-12-08 DIAGNOSIS — Z882 Allergy status to sulfonamides status: Secondary | ICD-10-CM

## 2022-12-08 DIAGNOSIS — J90 Pleural effusion, not elsewhere classified: Secondary | ICD-10-CM

## 2022-12-08 DIAGNOSIS — R0609 Other forms of dyspnea: Secondary | ICD-10-CM | POA: Insufficient documentation

## 2022-12-08 DIAGNOSIS — Z9849 Cataract extraction status, unspecified eye: Secondary | ICD-10-CM

## 2022-12-08 DIAGNOSIS — D649 Anemia, unspecified: Secondary | ICD-10-CM | POA: Diagnosis present

## 2022-12-08 DIAGNOSIS — G4700 Insomnia, unspecified: Secondary | ICD-10-CM | POA: Diagnosis present

## 2022-12-08 LAB — CBC WITH DIFFERENTIAL/PLATELET
Abs Immature Granulocytes: 0.04 10*3/uL (ref 0.00–0.07)
Basophils Absolute: 0.1 10*3/uL (ref 0.0–0.1)
Basophils Relative: 1 %
Eosinophils Absolute: 0.6 10*3/uL — ABNORMAL HIGH (ref 0.0–0.5)
Eosinophils Relative: 8 %
HCT: 29.5 % — ABNORMAL LOW (ref 39.0–52.0)
Hemoglobin: 9.2 g/dL — ABNORMAL LOW (ref 13.0–17.0)
Immature Granulocytes: 1 %
Lymphocytes Relative: 9 %
Lymphs Abs: 0.7 10*3/uL (ref 0.7–4.0)
MCH: 28.9 pg (ref 26.0–34.0)
MCHC: 31.2 g/dL (ref 30.0–36.0)
MCV: 92.8 fL (ref 80.0–100.0)
Monocytes Absolute: 0.5 10*3/uL (ref 0.1–1.0)
Monocytes Relative: 7 %
Neutro Abs: 6.1 10*3/uL (ref 1.7–7.7)
Neutrophils Relative %: 74 %
Platelets: 180 10*3/uL (ref 150–400)
RBC: 3.18 MIL/uL — ABNORMAL LOW (ref 4.22–5.81)
RDW: 14.4 % (ref 11.5–15.5)
WBC: 8 10*3/uL (ref 4.0–10.5)
nRBC: 0 % (ref 0.0–0.2)

## 2022-12-08 LAB — COMPREHENSIVE METABOLIC PANEL
ALT: 8 U/L (ref 0–44)
AST: 17 U/L (ref 15–41)
Albumin: 3 g/dL — ABNORMAL LOW (ref 3.5–5.0)
Alkaline Phosphatase: 70 U/L (ref 38–126)
Anion gap: 15 (ref 5–15)
BUN: 15 mg/dL (ref 8–23)
CO2: 42 mmol/L — ABNORMAL HIGH (ref 22–32)
Calcium: 8.5 mg/dL — ABNORMAL LOW (ref 8.9–10.3)
Chloride: 79 mmol/L — ABNORMAL LOW (ref 98–111)
Creatinine, Ser: 0.83 mg/dL (ref 0.61–1.24)
GFR, Estimated: >60 mL/min (ref >60)
Glucose, Bld: 129 mg/dL — ABNORMAL HIGH (ref 70–99)
Potassium: 2.2 mmol/L — CL (ref 3.5–5.1)
Sodium: 136 mmol/L (ref 135–145)
Total Bilirubin: 1 mg/dL (ref 0.3–1.2)
Total Protein: 6.4 g/dL — ABNORMAL LOW (ref 6.5–8.1)

## 2022-12-08 LAB — TROPONIN I (HIGH SENSITIVITY)
Troponin I (High Sensitivity): 5 ng/L (ref <18)
Troponin I (High Sensitivity): 10 ng/L (ref <18)

## 2022-12-08 LAB — BRAIN NATRIURETIC PEPTIDE: B Natriuretic Peptide: 168 pg/mL — ABNORMAL HIGH (ref 0.0–100.0)

## 2022-12-08 LAB — POTASSIUM: Potassium: 2.5 mmol/L — CL (ref 3.5–5.1)

## 2022-12-08 MED ORDER — POTASSIUM CHLORIDE CRYS ER 20 MEQ PO TBCR
40.0000 meq | EXTENDED_RELEASE_TABLET | Freq: Once | ORAL | Status: AC
  Administered 2022-12-08: 40 meq via ORAL
  Filled 2022-12-08: qty 2

## 2022-12-08 MED ORDER — HYDROCODONE-ACETAMINOPHEN 5-325 MG PO TABS
1.0000 | ORAL_TABLET | Freq: Once | ORAL | Status: AC
  Administered 2022-12-09: 1 via ORAL
  Filled 2022-12-08: qty 1

## 2022-12-08 MED ORDER — POTASSIUM CHLORIDE 10 MEQ/100ML IV SOLN
10.0000 meq | Freq: Once | INTRAVENOUS | Status: AC
  Administered 2022-12-08: 10 meq via INTRAVENOUS
  Filled 2022-12-08: qty 100

## 2022-12-08 MED ORDER — MAGNESIUM OXIDE -MG SUPPLEMENT 400 (240 MG) MG PO TABS
800.0000 mg | ORAL_TABLET | Freq: Once | ORAL | Status: AC
  Administered 2022-12-08: 800 mg via ORAL
  Filled 2022-12-08: qty 2

## 2022-12-08 MED ORDER — IOHEXOL 350 MG/ML SOLN
100.0000 mL | Freq: Once | INTRAVENOUS | Status: AC | PRN
  Administered 2022-12-08: 100 mL via INTRAVENOUS

## 2022-12-08 MED ORDER — MAGNESIUM SULFATE 2 GM/50ML IV SOLN
2.0000 g | Freq: Once | INTRAVENOUS | Status: AC
  Administered 2022-12-09: 2 g via INTRAVENOUS
  Filled 2022-12-08: qty 50

## 2022-12-08 NOTE — ED Notes (Signed)
Patient transported to CT 

## 2022-12-08 NOTE — Patient Instructions (Addendum)
Please keep your follow up appt with [facility] to determine best fit for portable 02   The goal is to keep the 02 saturations in the low 90s and wear the bipap as much as you can   If condition worsens go to ER  Please schedule a follow up visit in 3 months but call sooner if needed

## 2022-12-08 NOTE — ED Notes (Signed)
Patient was assisted in standing to use the urinal. Patient had an increase in shortness of breath.  SpO2 dropped to upper 70's on nasal cannula.

## 2022-12-08 NOTE — ED Provider Notes (Signed)
11:32 PM Assumed care from Dr. Posey Rea, please see their note for full history, physical and decision making until this point. In brief this is a 72 y.o. year old male who presented to the ED tonight with Fall and hypoxia     Fall, weakness --> hypo-k, getting repleted.  COPD baseline 4 L --> hypoxic requiring 6, needs pe read and admit.   Discharge instructions, including strict return precautions for new or worsening symptoms, given. Patient and/or family verbalized understanding and agreement with the plan as described.   Labs, studies and imaging reviewed by myself and considered in medical decision making if ordered. Imaging interpreted by radiology.  Labs Reviewed  COMPREHENSIVE METABOLIC PANEL - Abnormal; Notable for the following components:      Result Value   Potassium 2.2 (*)    Chloride 79 (*)    CO2 42 (*)    Glucose, Bld 129 (*)    Calcium 8.5 (*)    Total Protein 6.4 (*)    Albumin 3.0 (*)    All other components within normal limits  CBC WITH DIFFERENTIAL/PLATELET - Abnormal; Notable for the following components:   RBC 3.18 (*)    Hemoglobin 9.2 (*)    HCT 29.5 (*)    Eosinophils Absolute 0.6 (*)    All other components within normal limits  BRAIN NATRIURETIC PEPTIDE - Abnormal; Notable for the following components:   B Natriuretic Peptide 168.0 (*)    All other components within normal limits  POTASSIUM - Abnormal; Notable for the following components:   Potassium 2.5 (*)    All other components within normal limits  MAGNESIUM  TROPONIN I (HIGH SENSITIVITY)  TROPONIN I (HIGH SENSITIVITY)    DG Chest Portable 1 View  Final Result    CT Angio Chest PE W and/or Wo Contrast    (Results Pending)  CT Head Wo Contrast    (Results Pending)    No follow-ups on file.

## 2022-12-08 NOTE — Assessment & Plan Note (Signed)
There is no height or weight on file to calculate BMI.  -   Lab Results  Component Value Date   TSH 2.220 12/08/2022      Contributing to doe and risk of GERD >>>   reviewed the need and the process to achieve and maintain neg calorie balance > defer f/u primary care including intermittently monitoring thyroid status    F/u in 3 m, sooner if needed   I have major doubts that this pt can be cared for at home by his fm and strongly rec admit if any deterioration in his health going forward vs offer hospice if he wants to stay home as medical science may have done all it can at this point.           Each maintenance medication was reviewed in detail including emphasizing most importantly the difference between maintenance and prns and under what circumstances the prns are to be triggered using an action plan format where appropriate.  Total time for H and P, chart review, counseling, reviewing dpi/ hfa/02 device(s) and generating customized AVS unique to this office visit / same day charting = 33 min

## 2022-12-08 NOTE — Assessment & Plan Note (Signed)
Quit smoking 2023 with baseline wt 260 - PFT's  07/29/13   FEV1 2.11 (61 % ) ratio 0.87  p 2 % improvement from saba p ? prior to study with DLCO  18.1 (51%)   and FV curve no obst and ERV 16% at wt 375    Doubt there is much reversible lung dz > suspect combination of OHS? Restrictive changes and component of diastolic dysfunction  though dried out now clinically   Rec >>>> referred again to [facility] to work out best fit for portable 02 >>>> bipap as much as possible but always at hs/ napping  >>>> to [facility] ER if worsening

## 2022-12-08 NOTE — ED Triage Notes (Signed)
Pt called out for a fall at his home where he tripped over his threshold. Pt was not showing signs of or complaining of respiratory distress but presented with EMS with o2 sats in the 70's. EMS attempted to use the patients nasal canula, but the oxygen was still stagnant in the 80's. Pt was placed on a NRB on 15 liters and maintained in the mid 90's. Pt has a history of COPD, CHF, and AFIB. Patient has want appears to be poor circulation and possible cyanosis in his lower extremities.

## 2022-12-09 ENCOUNTER — Inpatient Hospital Stay (HOSPITAL_COMMUNITY): Payer: Medicare Other

## 2022-12-09 ENCOUNTER — Other Ambulatory Visit (HOSPITAL_COMMUNITY): Payer: Self-pay | Admitting: *Deleted

## 2022-12-09 DIAGNOSIS — E8809 Other disorders of plasma-protein metabolism, not elsewhere classified: Secondary | ICD-10-CM | POA: Diagnosis present

## 2022-12-09 DIAGNOSIS — W010XXA Fall on same level from slipping, tripping and stumbling without subsequent striking against object, initial encounter: Secondary | ICD-10-CM | POA: Diagnosis present

## 2022-12-09 DIAGNOSIS — G4733 Obstructive sleep apnea (adult) (pediatric): Secondary | ICD-10-CM

## 2022-12-09 DIAGNOSIS — J9622 Acute and chronic respiratory failure with hypercapnia: Secondary | ICD-10-CM | POA: Diagnosis present

## 2022-12-09 DIAGNOSIS — Z1152 Encounter for screening for COVID-19: Secondary | ICD-10-CM | POA: Diagnosis not present

## 2022-12-09 DIAGNOSIS — Z8249 Family history of ischemic heart disease and other diseases of the circulatory system: Secondary | ICD-10-CM | POA: Diagnosis not present

## 2022-12-09 DIAGNOSIS — I872 Venous insufficiency (chronic) (peripheral): Secondary | ICD-10-CM | POA: Diagnosis present

## 2022-12-09 DIAGNOSIS — E440 Moderate protein-calorie malnutrition: Secondary | ICD-10-CM | POA: Diagnosis present

## 2022-12-09 DIAGNOSIS — I890 Lymphedema, not elsewhere classified: Secondary | ICD-10-CM | POA: Diagnosis present

## 2022-12-09 DIAGNOSIS — I480 Paroxysmal atrial fibrillation: Secondary | ICD-10-CM | POA: Diagnosis not present

## 2022-12-09 DIAGNOSIS — D649 Anemia, unspecified: Secondary | ICD-10-CM | POA: Diagnosis present

## 2022-12-09 DIAGNOSIS — E782 Mixed hyperlipidemia: Secondary | ICD-10-CM | POA: Diagnosis present

## 2022-12-09 DIAGNOSIS — Y9301 Activity, walking, marching and hiking: Secondary | ICD-10-CM | POA: Diagnosis present

## 2022-12-09 DIAGNOSIS — J9811 Atelectasis: Secondary | ICD-10-CM | POA: Diagnosis present

## 2022-12-09 DIAGNOSIS — Z87891 Personal history of nicotine dependence: Secondary | ICD-10-CM | POA: Diagnosis not present

## 2022-12-09 DIAGNOSIS — I5031 Acute diastolic (congestive) heart failure: Secondary | ICD-10-CM

## 2022-12-09 DIAGNOSIS — M069 Rheumatoid arthritis, unspecified: Secondary | ICD-10-CM | POA: Diagnosis present

## 2022-12-09 DIAGNOSIS — I4819 Other persistent atrial fibrillation: Secondary | ICD-10-CM | POA: Diagnosis present

## 2022-12-09 DIAGNOSIS — I5033 Acute on chronic diastolic (congestive) heart failure: Secondary | ICD-10-CM | POA: Diagnosis present

## 2022-12-09 DIAGNOSIS — F32A Depression, unspecified: Secondary | ICD-10-CM | POA: Diagnosis present

## 2022-12-09 DIAGNOSIS — J9621 Acute and chronic respiratory failure with hypoxia: Secondary | ICD-10-CM | POA: Diagnosis present

## 2022-12-09 DIAGNOSIS — Z6841 Body Mass Index (BMI) 40.0 and over, adult: Secondary | ICD-10-CM | POA: Diagnosis not present

## 2022-12-09 DIAGNOSIS — J449 Chronic obstructive pulmonary disease, unspecified: Secondary | ICD-10-CM | POA: Diagnosis present

## 2022-12-09 DIAGNOSIS — E876 Hypokalemia: Secondary | ICD-10-CM | POA: Diagnosis present

## 2022-12-09 DIAGNOSIS — E46 Unspecified protein-calorie malnutrition: Secondary | ICD-10-CM

## 2022-12-09 DIAGNOSIS — Z7901 Long term (current) use of anticoagulants: Secondary | ICD-10-CM | POA: Diagnosis not present

## 2022-12-09 DIAGNOSIS — S50811A Abrasion of right forearm, initial encounter: Secondary | ICD-10-CM | POA: Diagnosis present

## 2022-12-09 DIAGNOSIS — I110 Hypertensive heart disease with heart failure: Secondary | ICD-10-CM | POA: Diagnosis present

## 2022-12-09 LAB — ECHOCARDIOGRAM LIMITED: S' Lateral: 4.3 cm

## 2022-12-09 LAB — CBC
HCT: 29.9 % — ABNORMAL LOW (ref 39.0–52.0)
Hemoglobin: 9.4 g/dL — ABNORMAL LOW (ref 13.0–17.0)
MCH: 29.1 pg (ref 26.0–34.0)
MCHC: 31.4 g/dL (ref 30.0–36.0)
MCV: 92.6 fL (ref 80.0–100.0)
Platelets: 165 10*3/uL (ref 150–400)
RBC: 3.23 MIL/uL — ABNORMAL LOW (ref 4.22–5.81)
RDW: 14.5 % (ref 11.5–15.5)
WBC: 7 10*3/uL (ref 4.0–10.5)
nRBC: 0 % (ref 0.0–0.2)

## 2022-12-09 LAB — COMPREHENSIVE METABOLIC PANEL
ALT: 10 U/L (ref 0–44)
AST: 25 U/L (ref 15–41)
Albumin: 3.1 g/dL — ABNORMAL LOW (ref 3.5–5.0)
Alkaline Phosphatase: 77 U/L (ref 38–126)
BUN: 13 mg/dL (ref 8–23)
CO2: >45 mmol/L — ABNORMAL HIGH (ref 22–32)
Calcium: 8.7 mg/dL — ABNORMAL LOW (ref 8.9–10.3)
Chloride: 84 mmol/L — ABNORMAL LOW (ref 98–111)
Creatinine, Ser: 0.73 mg/dL (ref 0.61–1.24)
GFR, Estimated: >60 mL/min (ref >60)
Glucose, Bld: 116 mg/dL — ABNORMAL HIGH (ref 70–99)
Potassium: 2.7 mmol/L — CL (ref 3.5–5.1)
Sodium: 139 mmol/L (ref 135–145)
Total Bilirubin: 1.2 mg/dL (ref 0.3–1.2)
Total Protein: 6.6 g/dL (ref 6.5–8.1)

## 2022-12-09 LAB — BASIC METABOLIC PANEL
Anion gap: 13 (ref 5–15)
BUN: 14 mg/dL (ref 8–23)
CO2: 43 mmol/L — ABNORMAL HIGH (ref 22–32)
Calcium: 8.6 mg/dL — ABNORMAL LOW (ref 8.9–10.3)
Chloride: 82 mmol/L — ABNORMAL LOW (ref 98–111)
Creatinine, Ser: 0.77 mg/dL (ref 0.61–1.24)
GFR, Estimated: >60 mL/min (ref >60)
Glucose, Bld: 179 mg/dL — ABNORMAL HIGH (ref 70–99)
Potassium: 2.9 mmol/L — ABNORMAL LOW (ref 3.5–5.1)
Sodium: 138 mmol/L (ref 135–145)

## 2022-12-09 LAB — PHOSPHORUS: Phosphorus: 2.8 mg/dL (ref 2.5–4.6)

## 2022-12-09 LAB — MAGNESIUM: Magnesium: 2.1 mg/dL (ref 1.7–2.4)

## 2022-12-09 LAB — GLUCOSE, CAPILLARY: Glucose-Capillary: 118 mg/dL — ABNORMAL HIGH (ref 70–99)

## 2022-12-09 MED ORDER — ONDANSETRON HCL 4 MG PO TABS: 4.0000 mg | ORAL_TABLET | Freq: Four times a day (QID) | ORAL | Status: DC | PRN

## 2022-12-09 MED ORDER — ONDANSETRON HCL 4 MG/2ML IJ SOLN: 4.0000 mg | Freq: Four times a day (QID) | INTRAMUSCULAR | Status: DC | PRN

## 2022-12-09 MED ORDER — POTASSIUM CHLORIDE 10 MEQ/100ML IV SOLN
10.0000 meq | INTRAVENOUS | Status: AC
  Administered 2022-12-09 (×3): 10 meq via INTRAVENOUS
  Filled 2022-12-09 (×3): qty 100

## 2022-12-09 MED ORDER — POTASSIUM CHLORIDE 10 MEQ/100ML IV SOLN
10.0000 meq | INTRAVENOUS | Status: AC
  Administered 2022-12-09 (×6): 10 meq via INTRAVENOUS
  Filled 2022-12-09 (×6): qty 100

## 2022-12-09 MED ORDER — POTASSIUM CHLORIDE 10 MEQ/100ML IV SOLN: 10.0000 meq | INTRAVENOUS | Status: DC

## 2022-12-09 MED ORDER — ALBUTEROL SULFATE (2.5 MG/3ML) 0.083% IN NEBU
2.5000 mg | INHALATION_SOLUTION | RESPIRATORY_TRACT | Status: DC | PRN
  Administered 2022-12-09 – 2022-12-10 (×2): 2.5 mg via RESPIRATORY_TRACT
  Filled 2022-12-09 (×2): qty 3

## 2022-12-09 MED ORDER — METOPROLOL TARTRATE 25 MG PO TABS
25.0000 mg | ORAL_TABLET | Freq: Two times a day (BID) | ORAL | Status: DC
  Administered 2022-12-09 – 2022-12-11 (×4): 25 mg via ORAL
  Filled 2022-12-09 (×4): qty 1

## 2022-12-09 MED ORDER — PERFLUTREN LIPID MICROSPHERE
1.0000 mL | INTRAVENOUS | Status: AC | PRN
  Administered 2022-12-09: 4 mL via INTRAVENOUS

## 2022-12-09 MED ORDER — ACETAMINOPHEN 650 MG RE SUPP: 650.0000 mg | Freq: Four times a day (QID) | RECTAL | Status: DC | PRN

## 2022-12-09 MED ORDER — TORSEMIDE 20 MG PO TABS
80.0000 mg | ORAL_TABLET | Freq: Two times a day (BID) | ORAL | Status: DC
  Administered 2022-12-09 – 2022-12-11 (×4): 80 mg via ORAL
  Filled 2022-12-09 (×5): qty 4

## 2022-12-09 MED ORDER — ENSURE ENLIVE PO LIQD
237.0000 mL | Freq: Two times a day (BID) | ORAL | Status: DC
  Administered 2022-12-09 – 2022-12-10 (×4): 237 mL via ORAL
  Filled 2022-12-09 (×6): qty 237

## 2022-12-09 MED ORDER — ATORVASTATIN CALCIUM 10 MG PO TABS
10.0000 mg | ORAL_TABLET | Freq: Every day | ORAL | Status: DC
  Administered 2022-12-09 – 2022-12-11 (×3): 10 mg via ORAL
  Filled 2022-12-09 (×3): qty 1

## 2022-12-09 MED ORDER — ACETAMINOPHEN 325 MG PO TABS
650.0000 mg | ORAL_TABLET | Freq: Four times a day (QID) | ORAL | Status: DC | PRN
  Administered 2022-12-09: 650 mg via ORAL
  Filled 2022-12-09: qty 2

## 2022-12-09 MED ORDER — POTASSIUM CHLORIDE CRYS ER 20 MEQ PO TBCR
40.0000 meq | EXTENDED_RELEASE_TABLET | Freq: Once | ORAL | Status: AC
  Administered 2022-12-09: 40 meq via ORAL
  Filled 2022-12-09: qty 2

## 2022-12-09 MED ORDER — APIXABAN 5 MG PO TABS
5.0000 mg | ORAL_TABLET | Freq: Two times a day (BID) | ORAL | Status: DC
  Administered 2022-12-09 – 2022-12-11 (×5): 5 mg via ORAL
  Filled 2022-12-09 (×5): qty 1

## 2022-12-09 MED ORDER — ALPRAZOLAM 0.25 MG PO TABS
0.2500 mg | ORAL_TABLET | Freq: Three times a day (TID) | ORAL | Status: DC | PRN
  Administered 2022-12-09 – 2022-12-11 (×3): 0.25 mg via ORAL
  Filled 2022-12-09 (×3): qty 1

## 2022-12-09 MED ORDER — FUROSEMIDE 10 MG/ML IJ SOLN
40.0000 mg | Freq: Two times a day (BID) | INTRAMUSCULAR | Status: DC
  Administered 2022-12-09: 40 mg via INTRAVENOUS
  Filled 2022-12-09: qty 4

## 2022-12-09 NOTE — Progress Notes (Signed)
RT spoke with patient about wearing CPAP at bedtime and he has declined use of a unit while in hospital. He is currently on HFNC and saturations are in the 90s. RT will continue to monitor status and make changes when needed.

## 2022-12-09 NOTE — ED Notes (Signed)
Attempted to contact the attending for a critical result. Unable to reach.

## 2022-12-09 NOTE — Progress Notes (Signed)
NT pulled me out of room 303 and said that this pt needed me. Another LPN stated that telemetry called the desk and stated HR was in the 150's. This nurse went into the room o2 was 79 on 7L he was c/o SOB. When this nurse walked into the room the current HR was in the 90's. Rapid response called and put pt on a NRB, O2 sat went up to 95. Transferred pt back to nasal cannula and o2 sats are 92 on 7L. DO came into assess pt and N.O for xanax to be given. Pt states he feels better now.

## 2022-12-09 NOTE — TOC Initial Note (Signed)
Transition of Care [facility]) - Initial/Assessment Note    Patient Details  Name: Jesus Rodriguez MRN: [id number] Date of Birth: [birth year]  [facility]) CM/SW Contact:    Elliot Gault, LCSW Phone Number: 12/09/2022, 10:41 AM  Clinical Narrative:                  Pt admitted from home. Pt known to TOC from previous admissions. Pt has been to [facility] for rehab in the recent past. He has also had [facility] services in the recent past though was just discharged from those services in the last few days. [facility] able to reinstate care if needed/wanted at dc.  Pt resides alone and has DME including O2, CPAP, lift chair, rollator, BSC.   MD anticipating dc in 1-2 days. TOC will follow and assist as needed.  Expected Discharge Plan: Home w Home Health Services Barriers to Discharge: Continued Medical Work up   Patient Goals and CMS Choice Patient states their goals for this hospitalization and ongoing recovery are:: go home          Expected Discharge Plan and Services In-house Referral: Clinical Social Work     Living arrangements for the past 2 months: Single Family Home                                      Prior Living Arrangements/Services Living arrangements for the past 2 months: Single Family Home Lives with:: Self Patient language and need for interpreter reviewed:: Yes Do you feel safe going back to the place where you live?: Yes      Need for Family Participation in Patient Care: Yes (Comment) Care giver support system in place?: Yes (comment) Current home services: DME Criminal Activity/Legal Involvement Pertinent to Current Situation/Hospitalization: No - Comment as needed  Activities of Daily Living      Permission Sought/Granted                  Emotional Assessment       Orientation: : Oriented to Self, Oriented to Place, Oriented to  Time, Oriented to Situation Alcohol / Substance Use: Not Applicable Psych  Involvement: No (comment)  Admission diagnosis:  Hypokalemia [E87.6] Pleural effusion [J90] Hypoxia [R09.02] Acute on chronic respiratory failure with hypoxia (HCC) [J96.21] Patient Active Problem List   Diagnosis Date Noted   Paroxysmal atrial fibrillation (HCC) 12/09/2022   DOE (dyspnea on exertion) 12/08/2022   Hearing loss 10/27/2022   Obstructive sleep apnea of adult 10/27/2022   Multifocal pneumonia 09/29/2022   Bilateral pleural effusion 09/29/2022   Obesity (BMI 30-39.9) 09/29/2022   Non-compliance with treatment 08/18/2022   Pulmonary hypertension (HCC) 04/10/2022   Respiratory acidosis 04/10/2022   Metabolic encephalopathy 04/09/2022   Obesity hypoventilation syndrome (HCC) 01/06/2022   CHF (congestive heart failure) (HCC) 01/03/2022   Respiratory distress 09/30/2021   Acute on chronic diastolic CHF (congestive heart failure) (HCC) 09/17/2021   Acute on chronic respiratory failure with hypoxia and hypercarbia 09/17/2021   Mixed hyperlipidemia 09/17/2021   Dependence on nocturnal oxygen therapy 04/25/2021   Hypotension 11/25/2020   AKI (acute kidney injury) (HCC) 11/24/2020   Chronic respiratory failure with hypoxia (HCC) 11/24/2020   Chronic diastolic CHF (congestive heart failure) (HCC) 11/24/2020   Tobacco use disorder 11/21/2020   Mild protein-calorie malnutrition (HCC) 11/21/2020   SBO (small bowel obstruction) (HCC) 11/20/2020   Obesity, Class III, BMI 40-49.9 (morbid  obesity) (HCC)    COPD with acute exacerbation (HCC)    Acute on chronic diastolic HF (heart failure) (HCC)    Pressure injury of skin 11/28/2019   Acute respiratory failure with hypoxia (HCC) 11/27/2019   Persistent atrial fibrillation with RVR (HCC) 05/11/2018   History of rheumatoid arthritis seronegative 09/10/2017   Typical atrial flutter (HCC) 04/11/2016   Long-term (current) use of anticoagulants, INR goal 2.0-3.0 03/18/2016   High risk medications (not anticoagulants) long-term use  02/14/2016   Class 3 obesity in adult 02/14/2016   Depression 02/14/2016   Osteoarthritis of both hands 02/13/2016   Osteoarthritis of both knees 02/13/2016   DDD (degenerative disc disease), lumbar 02/13/2016   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 [place] [address] Phone: [phone] Fax: [phone]  [facility] [address] [place] [address] Phone: [phone] Fax: [phone]  [facility] [place], [place] - [address] [place] [address] Phone: [phone] Fax: [phone]  [facility] [place], [place] - [address] [place] [address] Phone: [phone] Fax: [phone]     Social Determinants of Health (SDOH) Social History: SDOH Screenings   Food Insecurity: No Food Insecurity (09/29/2022)  Housing: Low Risk  (09/29/2022)  Transportation Needs: No Transportation Needs (09/29/2022)  Utilities: Not At Risk (09/29/2022)  Alcohol Screen: Low Risk  (11/05/2021)  Depression (PHQ2-9): Low Risk  (12/30/2021)  Financial Resource Strain: Low Risk  (11/05/2021)  Physical Activity: Inactive (11/05/2021)  Social Connections: Socially Isolated (11/05/2021)  Stress: No Stress Concern Present (11/05/2021)  Tobacco Use: Medium Risk (12/08/2022)   SDOH Interventions:     Readmission Risk Interventions    09/20/2021   12:37 PM 11/27/2020    1:12 PM  Readmission Risk Prevention Plan  Transportation Screening Complete Complete  PCP or Specialist Appt within 5-7 Days Not  Complete Not Complete  Home Care Screening Complete Complete  Medication Review (RN CM) Complete Complete

## 2022-12-09 NOTE — Plan of Care (Signed)

## 2022-12-09 NOTE — H&P (Addendum)
History and Physical    Patient: Jesus Rodriguez UYQ:[id number] DOB: 30-May-1950 DOA: 12/08/2022 DOS: the patient was seen and examined on 12/09/2022 PCP: System, Provider Not In  Patient coming from: Home  Chief Complaint:  Chief Complaint  Patient presents with   Fall   hypoxia   HPI: Jesus Rodriguez is a 72 y.o. male with medical history significant of diastolic CHF, chronic atrial fibrillation on Eliquis, OSA on CPAP, morbid obesity, chronic hypoxia on 4 L nasal cannula at baseline, hypertension, hyperlipidemia, chronic anxiety/depression who presents to the emergency department due to a fall sustained at home. Patient states that he sustained a fall while walking with a rollator and home oxygen tank, his leg got caught up in the oxygen tube and sustained a fall landing on the oxygen tank with left side of his chest, patient states that he called his sister who activated 911, but he was down for about 30 to 40 minutes before help arrived.  He was requiring more oxygen at 6 LPM.  He denies hitting his head, but endorsed right forearm abrasion. Patient was recently admitted from 6/16 to 6/19 due to community-acquired pneumonia which was initially treated with vancomycin and cefepime and was then transition to ceftriaxone and azithromycin and was discharged on oral doxycycline to complete course.  ED Course:  In emergency department, respiratory was 24/min on arrival to the ED, HR was 102/min, BP 111/50, temperature 99.5 F and O2 sat was 94% on NRB.  Workup in the ED showed normocytic anemia.  BMP showed sodium 138, potassium 2.9, chloride 82, bicarb 43, blood glucose 179, BUN 14, creatinine 0.77.  Magnesium 2.1, troponin x 1 was 5.  BNP 168 (this was 130 on 09/28/2022). CT head without contrast showed no acute intracranial abnormalities. CT angiography of chest with contrast showed no evidence of significant pulmonary embolus.  Cardiac enlargement with pulmonary vascular congestion and interstitial  edema.  Bilateral pleural effusion with bibasilar consolidation and volume loss, possibly compressive atelectasis or pneumonia.  Similar appearance to previous study Chest x-ray showed cardiomegaly with pulmonary vascular congestion.  Interstitial prominence bilaterally with airspace disease at the lung bases, possible edema or infiltrate.  Small to moderate bilateral pleural effusion. Potassium was replenished, magnesium was given, Tylenol was given.  Hospitalist was asked admit patient for further evaluation and management.  Review of Systems: Review of systems as noted in the HPI. All other systems reviewed and are negative.   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 His smoking use included cigarettes. He started smoking about 51 years ago. He has a 101.1 pack-year smoking history. He has never been exposed  to tobacco smoke. He has never used smokeless tobacco. He reports that he does not drink alcohol and does not use drugs.   Allergies  Allergen Reactions   Celebrex [Celecoxib]    Methotrexate Derivatives Other (See Comments)    Arrhythmia    Prednisone Other (See Comments)     Throwing up   Sulfa Antibiotics     Family History  Problem Relation Age of Onset   Hypertension Father    Heart attack Father    Prostate cancer Father    Hypertension Sister    Hypertension Sister    Hypertension Sister    Epilepsy Son    Diabetes Son      Prior to Admission medications   Medication Sig Start Date End Date Taking? Authorizing Provider  acetaminophen (TYLENOL) 500 MG tablet Take 500 mg by mouth every 6 (six) hours as needed for moderate pain.    [provider]  albuterol (PROVENTIL) (2.5 MG/3ML) 0.083% nebulizer solution Take 3 mLs (2.5 mg total) by nebulization every 4 (four) hours as needed for wheezing or shortness of breath. 10/22/22   Coralyn Helling, MD  albuterol (VENTOLIN HFA) 108 (90 Base) MCG/ACT inhaler Inhale 1 puff into the lungs every 6 (six) hours as needed. Patient taking differently: Inhale 1 puff into the lungs every 6 (six) hours as needed for shortness of breath or wheezing. 10/21/21   Coralyn Helling, MD  apixaban (ELIQUIS) 5 MG TABS tablet TAKE 1 TABLET BY MOUTH TWICE A DAY 09/01/22   Antoine Poche, MD  atorvastatin (LIPITOR) 10 MG tablet TAKE 1 TABLET BY MOUTH EVERY DAY 10/30/22   Antoine Poche, MD  cetirizine (ZYRTEC) 10 MG tablet Take 10 mg by mouth daily.    [provider]  doxycycline (MONODOX) 100 MG capsule Take 100 mg by mouth 2 (two) times daily. Patient not taking: Reported on 12/08/2022 11/01/22   [provider]  Ensure Max Protein (ENSURE MAX PROTEIN) LIQD Take 330 mLs (11 oz total) by mouth at bedtime. 09/10/22   Ghimire, Werner Lean, MD  Fluticasone-Umeclidin-Vilant (TRELEGY ELLIPTA) 100-62.5-25 MCG/ACT AEPB Inhale 1 puff into the lungs daily. 10/27/22   [name], MD  guaiFENesin (MUCINEX) 600 MG 12 hr tablet Take 1,200 mg by mouth 2 (two) times daily.    [provider]  metoprolol tartrate (LOPRESSOR) 25 MG tablet Take 25 mg by mouth 2 (two) times daily.    [provider]   torsemide (DEMADEX) 20 MG tablet Take 4 tablets (80 mg total) by mouth 2 (two) times daily. 11/06/22 02/04/23  Antoine Poche, MD  triamcinolone (KENALOG) 0.1 % Apply 1 application  topically 3 (three) times daily as needed (irritation). 02/25/20   [provider]    Physical Exam: BP 137/78   Pulse 95   Temp 99.5 F (37.5 C) (Axillary)   Resp (!) 22   SpO2 93%   General: 72 y.o. year-old male well developed well nourished in no acute distress.  Alert and oriented x3. HEENT: NCAT, EOMI Neck: Supple, trachea medial Cardiovascular: Regular rate and rhythm with no rubs or gallops.  No thyromegaly or JVD noted.  No lower extremity edema. 2/4 pulses in all 4 extremities. Respiratory: Clear to auscultation with no wheezes or rales. Good inspiratory effort. Abdomen: Soft, nontender nondistended with normal bowel sounds x4 quadrants. Muskuloskeletal:  Bilateral lower extremity chronic lymphedema with thickened skin.   Neuro: CN II-XII intact, strength 5/5 x 4, sensation, reflexes intact Skin: No ulcerative lesions  noted or rashes Psychiatry: Judgement and insight appear normal. Mood is appropriate for condition and setting          Labs on Admission:  Basic Metabolic Panel: Recent Labs  Lab 12/08/22 1719 12/08/22 2159 12/09/22 0140  NA 136  --  138  K 2.2* 2.5* 2.9*  CL 79*  --  82*  CO2 42*  --  43*  GLUCOSE 129*  --  179*  BUN 15  --  14  CREATININE 0.83  --  0.77  CALCIUM 8.5*  --  8.6*  MG  --  2.1  --    Liver Function Tests: Recent Labs  Lab 12/08/22 1719  AST 17  ALT 8  ALKPHOS 70  BILITOT 1.0  PROT 6.4*  ALBUMIN 3.0*   No results for input(s): "LIPASE", "AMYLASE" in the last 168 hours. No results for input(s): "AMMONIA" in the last 168 hours. CBC: Recent Labs  Lab 12/08/22 1719  WBC 8.0  NEUTROABS 6.1  HGB 9.2*  HCT 29.5*  MCV 92.8  PLT 180   Cardiac Enzymes: No results for input(s): "CKTOTAL", "CKMB", "CKMBINDEX", "TROPONINI" in the  last 168 hours.  BNP (last 3 results) Recent Labs    09/03/22 0450 09/28/22 2029 12/08/22 1719  BNP 59.1 130.0* 168.0*    ProBNP (last 3 results) No results for input(s): "PROBNP" in the last 8760 hours.  CBG: No results for input(s): "GLUCAP" in the last 168 hours.  Radiological Exams on Admission: CT Head Wo Contrast  Result Date: 12/08/2022 CLINICAL DATA:  Minor head trauma. Trip and fall injury. Decreased oxygen saturation. EXAM: CT HEAD WITHOUT CONTRAST TECHNIQUE: Contiguous axial images were obtained from the base of the skull through the vertex without intravenous contrast. RADIATION DOSE REDUCTION: This exam was performed according to the departmental dose-optimization program which includes automated exposure control, adjustment of the mA and/or kV according to patient size and/or use of iterative reconstruction technique. COMPARISON:  None Available. FINDINGS: Brain: No evidence of acute infarction, hemorrhage, hydrocephalus, extra-axial collection or mass lesion/mass effect. Mild diffuse cerebral atrophy. Mild ventricular dilatation consistent with central atrophy. Vascular: No hyperdense vessel or unexpected calcification. Skull: Normal. Negative for fracture or focal lesion. Sinuses/Orbits: Mucosal thickening in the paranasal sinuses. No acute air-fluid levels. Mastoid air cells are clear. Other: Motion artifact limits examination. IMPRESSION: 1. No acute intracranial abnormalities.  Chronic atrophy. Electronically Signed   By: Burman Nieves M.D.   On: 12/08/2022 23:37   CT Angio Chest PE W and/or Wo Contrast  Result Date: 12/08/2022 CLINICAL DATA:  Pulmonary embolus suspected with high probability. Trip and fall injury. Decreased oxygen saturation. EXAM: CT ANGIOGRAPHY CHEST WITH CONTRAST TECHNIQUE: Multidetector CT imaging of the chest was performed using the standard protocol during bolus administration of intravenous contrast. Multiplanar CT image reconstructions and MIPs  were obtained to evaluate the vascular anatomy. RADIATION DOSE REDUCTION: This exam was performed according to the departmental dose-optimization program which includes automated exposure control, adjustment of the mA and/or kV according to patient size and/or use of iterative reconstruction technique. CONTRAST:  OMNIPAQUE IOHEXOL 350 MG/ML SOLN COMPARISON:  Chest radiograph 12/08/2022.  CT 09/28/2022 FINDINGS: Cardiovascular: Good opacification of the central and segmental pulmonary arteries. No focal filling defects. No evidence of significant pulmonary embolus. Cardiac enlargement. No pericardial effusions. Normal caliber thoracic aorta. No dissection. Mediastinum/Nodes: Esophagus is decompressed. Scattered lymph nodes throughout the mediastinum. Largest include a right paratracheal node measuring about 1.3 cm diameter. Similar appearance to previous study. Thyroid  gland is unremarkable. Lungs/Pleura: Small bilateral pleural effusions, greater on the right. Consolidation and volume loss in the lung bases, possibly atelectasis or pneumonia. Diffuse interstitial thickening consistent with pulmonary edema. Similar appearance to previous study. Upper Abdomen: No acute abnormalities. Elevation of the left hemidiaphragm. Musculoskeletal: No chest wall abnormality. No acute or significant osseous findings. Diffuse degenerative changes in the spine. Review of the MIP images confirms the above findings. IMPRESSION: 1. No evidence of significant pulmonary embolus. 2. Cardiac enlargement with pulmonary vascular congestion and interstitial edema. 3. Bilateral pleural effusions with basilar consolidation and volume loss, possibly compressive atelectasis or pneumonia. Similar appearance to previous study. 4. Prominent lymph nodes in the mediastinum without change, likely reactive. 5. Aortic atherosclerosis. Electronically Signed   By: Burman Nieves M.D.   On: 12/08/2022 23:35   DG Chest Portable 1 View  Result  Date: 12/08/2022 CLINICAL DATA:  Hypoxia. Fall at home. History of COPD, CHF, and AFib. EXAM: PORTABLE CHEST 1 VIEW COMPARISON:  10/01/2022. FINDINGS: The heart is enlarged and the mediastinal contour is within normal limits. The pulmonary vasculature is distended. There are small to moderate bilateral pleural effusions with edema or infiltrate at the lung bases. Interstitial prominence is noted bilaterally. No pneumothorax. No acute osseous abnormality. IMPRESSION: 1. Cardiomegaly with pulmonary vascular congestion. 2. Interstitial prominence bilaterally with airspace disease at the lung bases, possible edema or infiltrate. 3. Small to moderate bilateral pleural effusions. Electronically Signed   By: Thornell Sartorius M.D.   On: 12/08/2022 20:13    EKG: I independently viewed the EKG done and my findings are as followed: Atrial fibrillation with rate control  Assessment/Plan Present on Admission:  Acute on chronic respiratory failure with hypoxia and hypercarbia  Acute on chronic diastolic HF (heart failure) (HCC)  Obstructive sleep apnea  Obesity, Class III, BMI 40-49.9 (morbid obesity) (HCC)  Principal Problem:   Acute on chronic respiratory failure with hypoxia and hypercarbia Active Problems:   Obstructive sleep apnea   Obesity, Class III, BMI 40-49.9 (morbid obesity) (HCC)   Acute on chronic diastolic HF (heart failure) (HCC)   Paroxysmal atrial fibrillation (HCC)  Acute on chronic respiratory failure with hypoxia This may be due to bilateral pleural effusion and interstitial edema noted on CT of chest Diuretics will be provided once potassium level is improved Continue supplemental oxygen via [place] to maintain O2 sats > 92%  Acute on chronic diastolic CHF Elevated BNP-168 CT of chest and chest x-ray showed increased pulmonary vascular congestion and bilateral pleural effusion Continue IV Lasix 40 mg twice daily ( Continue to monitor potassium level) Continue total input/output, daily  weights and fluid restriction Continue heart healthy diet  Echocardiogram done in June 2024 showed LVEF of 55 to 60%.  Moderate LVH.  LV diastolic parameters are indeterminate.   Echocardiogram will be done in the morning.  Hypokalemia  K+ 2.2 > 2.5 > 2.9 K+ will be replenished Please monitor for AM K+ for further replenishmemnt  Paroxysmal atrial fibrillation Continue Eliquis, metoprolol  Obstructive sleep apnea Continue CPAP nightly  Hypoalbuminemia due to moderate protein calorie malnutrition Albumin 3.0, protein supplement will be provided  Morbid obesity (BMI 43.8) Diet and lifestyle modification Patient may need to follow-up with outpatient PCP for weight loss    DVT prophylaxis: Eliquis   Advance Care Planning: CODE STATUS: Full code  Consults: None  Family Communication: None at bedside  Severity of Illness: The appropriate patient status for this patient is INPATIENT. Inpatient status is judged to be  reasonable and necessary in order to provide the required intensity of service to ensure the patient's safety. The patient's presenting symptoms, physical exam findings, and initial radiographic and laboratory data in the context of their chronic comorbidities is felt to place them at high risk for further clinical deterioration. Furthermore, it is not anticipated that the patient will be medically stable for discharge from the hospital within 2 midnights of admission.   * I certify that at the point of admission it is my clinical judgment that the patient will require inpatient hospital care spanning beyond 2 midnights from the point of admission due to high intensity of service, high risk for further deterioration and high frequency of surveillance required.*  Author: Frankey Shown, DO 12/09/2022 4:21 AM  For on call review [email or web address].

## 2022-12-09 NOTE — ED Notes (Signed)
Skin tear noted on R elbow

## 2022-12-09 NOTE — ED Notes (Signed)
Skin tears noted to bilateral hands

## 2022-12-09 NOTE — Progress Notes (Signed)
Jesus Rodriguez is a 72 y.o. male with medical history significant of diastolic CHF, chronic atrial fibrillation on Eliquis, OSA on CPAP, morbid obesity, chronic hypoxia on 4 L nasal cannula at baseline, hypertension, hyperlipidemia, chronic anxiety/depression who presents to the emergency department due to a fall sustained at home. Patient states that he sustained a fall while walking with a rollator and home oxygen tank, his leg got caught up in the oxygen tube and sustained a fall landing on the oxygen tank with left side of his chest, patient states that he called his sister who activated 911, but he was down for about 30 to 40 minutes before help arrived.  He was requiring more oxygen at 6 LPM.  He denies hitting his head, but endorsed right forearm abrasion. Patient was recently admitted from 6/16 to 6/19 due to community-acquired pneumonia which was initially treated with vancomycin and cefepime and was then transition to ceftriaxone and azithromycin and was discharged on oral doxycycline to complete course.  Patient was admitted with acute on chronic hypoxemic respiratory failure likely related to acute on chronic diastolic CHF exacerbation.  He was started on IV Lasix for diuresis, but is also noted to have significant hypokalemia which is difficult to replenish in the face of diuresis.  Therefore, he will undergo aggressive potassium supplementation and IV diuresis will be held at this time until potassium levels are normalized.  Plan to restart home torsemide this evening.  2D echocardiogram ordered and pending.  Patient seen and evaluated at bedside and has been admitted after midnight.  PT evaluation ordered and pending.  Anticipate discharge in the next 24-48 hours pending clinical improvement.  Total care time: 30 minutes.

## 2022-12-09 NOTE — Progress Notes (Signed)
PRN xanax provided minimal relief. Pt states he still gets SOB at times. Pt still on 7L stating at 93%.

## 2022-12-09 NOTE — Progress Notes (Signed)
Pt.transported to 301. RN/CNA aware of pt's arrival. Oxygen connected, bed locked and in lowest position. Call bell within reach. CNA aware pt.has purewick and will discuss need with pt. All pt's belongings verified with pt.and taken to room.

## 2022-12-09 NOTE — ED Notes (Signed)
Skin tears noted on bilateral arms

## 2022-12-10 ENCOUNTER — Encounter: Payer: Self-pay | Admitting: Internal Medicine

## 2022-12-10 DIAGNOSIS — J9621 Acute and chronic respiratory failure with hypoxia: Secondary | ICD-10-CM | POA: Diagnosis not present

## 2022-12-10 DIAGNOSIS — I480 Paroxysmal atrial fibrillation: Secondary | ICD-10-CM | POA: Diagnosis not present

## 2022-12-10 DIAGNOSIS — G4733 Obstructive sleep apnea (adult) (pediatric): Secondary | ICD-10-CM | POA: Diagnosis not present

## 2022-12-10 LAB — CBC WITH DIFFERENTIAL/PLATELET
Basophils Absolute: 0.1 10*3/uL (ref 0.0–0.2)
Basos: 1 %
EOS (ABSOLUTE): 0.8 10*3/uL — ABNORMAL HIGH (ref 0.0–0.4)
Eos: 10 %
Hematocrit: 30.7 % — ABNORMAL LOW (ref 37.5–51.0)
Hemoglobin: 10.1 g/dL — ABNORMAL LOW (ref 13.0–17.7)
Immature Grans (Abs): 0 10*3/uL (ref 0.0–0.1)
Immature Granulocytes: 0 %
Lymphocytes Absolute: 1.3 10*3/uL (ref 0.7–3.1)
Lymphs: 16 %
MCH: 29.4 pg (ref 26.6–33.0)
MCHC: 32.9 g/dL (ref 31.5–35.7)
MCV: 90 fL (ref 79–97)
Monocytes Absolute: 0.6 10*3/uL (ref 0.1–0.9)
Monocytes: 7 %
Neutrophils Absolute: 5.6 10*3/uL (ref 1.4–7.0)
Neutrophils: 66 %
Platelets: 204 10*3/uL (ref 150–450)
RBC: 3.43 x10E6/uL — ABNORMAL LOW (ref 4.14–5.80)
RDW: 13.2 % (ref 11.6–15.4)
WBC: 8.4 10*3/uL (ref 3.4–10.8)

## 2022-12-10 LAB — CBC
HCT: 33 % — ABNORMAL LOW (ref 39.0–52.0)
Hemoglobin: 10 g/dL — ABNORMAL LOW (ref 13.0–17.0)
MCH: 29.1 pg (ref 26.0–34.0)
MCHC: 30.3 g/dL (ref 30.0–36.0)
MCV: 95.9 fL (ref 80.0–100.0)
Platelets: 151 10*3/uL (ref 150–400)
RBC: 3.44 MIL/uL — ABNORMAL LOW (ref 4.22–5.81)
RDW: 14.3 % (ref 11.5–15.5)
WBC: 7.9 10*3/uL (ref 4.0–10.5)
nRBC: 0 % (ref 0.0–0.2)

## 2022-12-10 LAB — BASIC METABOLIC PANEL
BUN/Creatinine Ratio: 19 (ref 10–24)
BUN: 13 mg/dL (ref 8–27)
CO2: 42 mmol/L (ref 20–29)
Calcium: 9 mg/dL (ref 8.6–10.2)
Chloride: 82 mmol/L — ABNORMAL LOW (ref 96–106)
Creatinine, Ser: 0.69 mg/dL — ABNORMAL LOW (ref 0.76–1.27)
Glucose: 117 mg/dL — ABNORMAL HIGH (ref 70–99)
Potassium: 2.8 mmol/L — ABNORMAL LOW (ref 3.5–5.2)
Sodium: 143 mmol/L (ref 134–144)
eGFR: 98 mL/min/{1.73_m2} (ref >59)

## 2022-12-10 LAB — COMPREHENSIVE METABOLIC PANEL
ALT: 10 U/L (ref 0–44)
AST: 28 U/L (ref 15–41)
Albumin: 3.1 g/dL — ABNORMAL LOW (ref 3.5–5.0)
Alkaline Phosphatase: 75 U/L (ref 38–126)
Anion gap: 9 (ref 5–15)
BUN: 11 mg/dL (ref 8–23)
CO2: 44 mmol/L — ABNORMAL HIGH (ref 22–32)
Calcium: 8.7 mg/dL — ABNORMAL LOW (ref 8.9–10.3)
Chloride: 86 mmol/L — ABNORMAL LOW (ref 98–111)
Creatinine, Ser: 0.7 mg/dL (ref 0.61–1.24)
GFR, Estimated: >60 mL/min (ref >60)
Glucose, Bld: 116 mg/dL — ABNORMAL HIGH (ref 70–99)
Potassium: 3.1 mmol/L — ABNORMAL LOW (ref 3.5–5.1)
Sodium: 139 mmol/L (ref 135–145)
Total Bilirubin: 1 mg/dL (ref 0.3–1.2)
Total Protein: 6.7 g/dL (ref 6.5–8.1)

## 2022-12-10 LAB — MAGNESIUM: Magnesium: 2.5 mg/dL — ABNORMAL HIGH (ref 1.7–2.4)

## 2022-12-10 LAB — BRAIN NATRIURETIC PEPTIDE: BNP: 101 pg/mL — ABNORMAL HIGH (ref 0.0–100.0)

## 2022-12-10 LAB — TSH: TSH: 2.22 u[IU]/mL (ref 0.450–4.500)

## 2022-12-10 MED ORDER — METOLAZONE 5 MG PO TABS
2.5000 mg | ORAL_TABLET | Freq: Every day | ORAL | Status: AC
  Administered 2022-12-10 – 2022-12-11 (×2): 2.5 mg via ORAL
  Filled 2022-12-10 (×2): qty 1

## 2022-12-10 MED ORDER — POTASSIUM CHLORIDE CRYS ER 20 MEQ PO TBCR
40.0000 meq | EXTENDED_RELEASE_TABLET | Freq: Every day | ORAL | Status: DC
  Administered 2022-12-10 – 2022-12-11 (×2): 40 meq via ORAL
  Filled 2022-12-10 (×2): qty 2

## 2022-12-10 NOTE — Progress Notes (Signed)
Pt unstable on 7L Harold to stand long enough for weight.  It was agreed to get weight when Pt transfer back to bed later today.  Bed has been zeroed.    RN directed NT of this plan.

## 2022-12-10 NOTE — Care Management Important Message (Signed)
Important Message  Patient Details  Name: Jesus Rodriguez MRN: [id number] Date of Birth: 08-Apr-1951   Medicare Important Message Given:  N/A - LOS <3 / Initial given by admissions     Corey Harold 12/10/2022, 8:52 AM

## 2022-12-10 NOTE — Progress Notes (Signed)
Progress Note   Patient: Jesus Rodriguez YQM:[id number] DOB: 03/05/1951 DOA: 12/08/2022     1 DOS: the patient was seen and examined on 12/10/2022   Brief hospital admission narrative course: As per H&P written by Dr. Thomes Dinning on 12/09/2022 Jesus Rodriguez is a 72 y.o. male with medical history significant of diastolic CHF, chronic atrial fibrillation on Eliquis, OSA on CPAP, morbid obesity, chronic hypoxia on 4 L nasal cannula at baseline, hypertension, hyperlipidemia, chronic anxiety/depression who presents to the emergency department due to a fall sustained at home. Patient states that he sustained a fall while walking with a rollator and home oxygen tank, his leg got caught up in the oxygen tube and sustained a fall landing on the oxygen tank with left side of his chest, patient states that he called his sister who activated 911, but he was down for about 30 to 40 minutes before help arrived.  He was requiring more oxygen at 6 LPM.  He denies hitting his head, but endorsed right forearm abrasion. Patient was recently admitted from 6/16 to 6/19 due to community-acquired pneumonia which was initially treated with vancomycin and cefepime and was then transition to ceftriaxone and azithromycin and was discharged on oral doxycycline to complete course.   ED Course:  In emergency department, respiratory was 24/min on arrival to the ED, HR was 102/min, BP 111/50, temperature 99.5 F and O2 sat was 94% on NRB.  Workup in the ED showed normocytic anemia.  BMP showed sodium 138, potassium 2.9, chloride 82, bicarb 43, blood glucose 179, BUN 14, creatinine 0.77.  Magnesium 2.1, troponin x 1 was 5.  BNP 168 (this was 130 on 09/28/2022). CT head without contrast showed no acute intracranial abnormalities. CT angiography of chest with contrast showed no evidence of significant pulmonary embolus.  Cardiac enlargement with pulmonary vascular congestion and interstitial edema.  Bilateral pleural effusion with bibasilar  consolidation and volume loss, possibly compressive atelectasis or pneumonia.  Similar appearance to previous study Chest x-ray showed cardiomegaly with pulmonary vascular congestion.  Interstitial prominence bilaterally with airspace disease at the lung bases, possible edema or infiltrate.  Small to moderate bilateral pleural effusion. Potassium was replenished, magnesium was given, Tylenol was given.  Hospitalist was asked admit patient for further evaluation and management.  Assessment and Plan: 1-acute on chronic respiratory failure with hypoxia -In the setting of acute on chronic diastolic heart failure and pulmonary vascular congestion -Will continue oxygen supplementation with plan to wean off down to patient's baseline based on improvement in his volume status and diuresis -Continue to follow daily weights and strict intake and output. -Atelectasis diuresis IV patient's diuretic has been transitioned to oral torsemide at adjusted dose along with metolazone and will continue closely following urine output -Close observation to renal function electrolytes will be provided with repletion as needed. -2D echo demonstrating preserved ejection fraction, no wall motion abnormalities and no significant valvular disorder. -Low-sodium diet and daily weight has been discussed with patient.  2-hypokalemia -Continue to replete electrolytes and follow trend  3-history of paroxysmal atrial fibrillation -Currently sinus rhythm appreciated -Continue telemetry monitoring -Continue the use of metoprolol and Eliquis  4-obstructive sleep apnea -Continue CPAP nightly.  5-morbid obesity -Body mass index is 41.21 kg/m. -Low-calorie diet, portion control and increase physical activity discussed with patient.  6-chronic lymphedema/stasis dermatitis -Patient reported multiple recurrent episodes of lower extremity cellulitis and having started on suppressive therapy using doxycycline by his  PCP.  Subjective:  No fever, no chest pain, no nausea, no  vomiting; reports feeling better and breathing easier.  Still short winded with activity and demonstrating signs of fluid overload.  Physical Exam: Vitals:   12/09/22 2043 12/10/22 0529 12/10/22 1131 12/10/22 1448  BP: (!) 142/84 124/65  131/87  Pulse: 89 67  78  Resp: 20 (!) 22  20  Temp: 98.1 F (36.7 C) 98.2 F (36.8 C)  98.5 F (36.9 C)  TempSrc: Oral Oral  Oral  SpO2: 100% 100%  99%  Weight:   (!) 145.6 kg   Height:   6\' 2"  (1.88 m)    General exam: Alert, awake, oriented x 3; 4.5 L nasal cannula supplementation in place; no chest pain, no nausea, no vomiting.  Complaining of orthopnea.  Short winded with activity. Respiratory system: No using accessory muscles; tachypneic with exertion.  Decreased breath sounds at the bases with fine crackles. Cardiovascular system:RRR. No rubs or gallops; unable to assess JVD with body habitus. Gastrointestinal system: Abdomen is obese, nondistended, soft and nontender. No organomegaly or masses felt. Normal bowel sounds heard. Central nervous system: No focal neurological deficits. Extremities: No cyanosis or clubbing; 2-3+ edema bilaterally. Skin: No petechiae; positive SSA dermatitis appreciated on his legs bilaterally.   Psychiatry: Judgement and insight appear normal. Mood & affect appropriate.   Data Reviewed: Comprehensive metabolic panel: Sodium 139, potassium 3.1, chloride 86, bicarb 44, BUN 11, creatinine 0.70 and normal LFTs. Magnesium: 2.5 CBC: White blood cells 7.9, hemoglobin 10.0 platelet count 1 51K  Family Communication: Wife at bedside.  Disposition: Status is: Inpatient Remains inpatient appropriate because: Continue electrolyte repletion; continue adjusted dose of torsemide along with metolazone with close monitoring of the patient's urine output.  He has received IV diuresis until 12/09/2022.   Planned Discharge Destination: Home  Time spent: 45  minutes  Author: Vassie Loll, MD 12/10/2022 6:44 PM  For on call review [email or web address].

## 2022-12-10 NOTE — Progress Notes (Signed)
Patient declines hospital CPAP for the night. Wears his at home but doesn't want to wear one of ours here. Told patient to call if he changed his mind. No unit in room at this time.

## 2022-12-10 NOTE — ACP (Advance Care Planning) (Signed)
Patient stated today that he would like his sister to be his HCPOA; Epic updated; documents given to patient. He wants to look over them with his sister and will get them notarized and completed at a later date.   Jolyn Lent, M.Div Chaplain

## 2022-12-10 NOTE — Progress Notes (Signed)
   12/10/22 0800  ReDS Vest / Clip  Station Marker D  Ruler Value 47  ReDS Value Range (!) > 40  ReDS Actual Value 37

## 2022-12-10 NOTE — Evaluation (Signed)
Physical Therapy Evaluation Patient Details Name: Jesus Rodriguez MRN: [id number] DOB: 1950/10/27 Today's Date: 12/10/2022  History of Present Illness  Jesus Rodriguez is a 72 y.o. male with medical history significant of diastolic CHF, chronic atrial fibrillation on Eliquis, OSA on CPAP, morbid obesity, chronic hypoxia on 4 L nasal cannula at baseline, hypertension, hyperlipidemia, chronic anxiety/depression who presents to the emergency department due to a fall sustained at home.  Patient states that he sustained a fall while walking with a rollator and home oxygen tank, his leg got caught up in the oxygen tube and sustained a fall landing on the oxygen tank with left side of his chest, patient states that he called his sister who activated 911, but he was down for about 30 to 40 minutes before help arrived.  He was requiring more oxygen at 6 LPM.  He denies hitting his head, but endorsed right forearm abrasion.  Patient was recently admitted from 6/16 to 6/19 due to community-acquired pneumonia which was initially treated with vancomycin and cefepime and was then transition to ceftriaxone and azithromycin and was discharged on oral doxycycline to complete course.   Clinical Impression  Patient functioning near baseline for functional mobility and gait demonstrating good return for ambulating in room without loss of balance and limited mostly due to c/o fatigue.  Patient declined to attempt bed mobility training due to stating he sleeps in a lift chair at home.  Patient tolerated staying up in chair after therapy.  Patient will benefit from continued skilled physical therapy in hospital and recommended venue below to increase strength, balance, endurance for safe ADLs and gait.           If plan is discharge home, recommend the following: Help with stairs or ramp for entrance;Assistance with cooking/housework;A little help with bathing/dressing/bathroom   Can travel by private vehicle         Equipment Recommendations None recommended by PT  Recommendations for Other Services       Functional Status Assessment Patient has had a recent decline in their functional status and demonstrates the ability to make significant improvements in function in a reasonable and predictable amount of time.     Precautions / Restrictions Precautions Precautions: Fall Restrictions Weight Bearing Restrictions: No      Mobility  Bed Mobility               General bed mobility comments: Patient presents up in chair and states he sleeps in a lift chair at home    Transfers Overall transfer level: Modified independent                      Ambulation/Gait Ambulation/Gait assistance: Supervision Gait Distance (Feet): 25 Feet Assistive device: Rolling walker (2 wheels) Gait Pattern/deviations: Decreased step length - right, Decreased step length - left, Decreased stride length Gait velocity: decreased     General Gait Details: slightly labored cadence without loss of balance, limited mostly due to fatigue, on 4 LPM O2  Stairs            Wheelchair Mobility     Tilt Bed    Modified Rankin (Stroke Patients Only)       Balance Overall balance assessment: Needs assistance Sitting-balance support: Feet supported, No upper extremity supported Sitting balance-Leahy Scale: Good Sitting balance - Comments: seated at EOB   Standing balance support: Reliant on assistive device for balance, During functional activity, Bilateral upper extremity supported Standing balance-Leahy Scale: Fair Standing balance comment:  fair/good using RW                             Pertinent Vitals/Pain Pain Assessment Pain Assessment: No/denies pain    Home Living Family/patient expects to be discharged to:: Private residence Living Arrangements: Alone Available Help at Discharge: Available 24 hours/day;Friend(s) Type of Home: Mobile home Home Access: Ramped  entrance       Home Layout: One level Home Equipment: Agricultural consultant (2 wheels);Rollator (4 wheels);BSC/3in1;Grab bars - toilet;Grab bars - tub/shower;Wheelchair - Manufacturing systems engineer      Prior Function Prior Level of Function : Independent/Modified Independent;Driving             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, drives, 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; states he has been on 4 recently ADLs Comments: assisted with IADL by family; drives; does his own medication and financial managment     Extremity/Trunk Assessment   Upper Extremity Assessment Upper Extremity Assessment: Overall WFL for tasks assessed    Lower Extremity Assessment Lower Extremity Assessment: Generalized weakness    Cervical / Trunk Assessment Cervical / Trunk Assessment: Normal  Communication   Communication Communication: No apparent difficulties  Cognition Arousal: Alert Behavior During Therapy: WFL for tasks assessed/performed Overall Cognitive Status: Within Functional Limits for tasks assessed                                          General Comments      Exercises     Assessment/Plan    PT Assessment Patient needs continued PT services  PT Problem List Decreased strength;Decreased activity tolerance;Decreased balance;Decreased mobility       PT Treatment Interventions DME instruction;Gait training;Stair training;Functional mobility training;Therapeutic activities;Therapeutic exercise;Balance training;Patient/family education    PT Goals (Current goals can be found in the Care Plan section)  Acute Rehab PT Goals Patient Stated Goal: return home PT Goal Formulation: With patient Time For Goal Achievement: 12/13/22 Potential to Achieve Goals: Good    Frequency Min 3X/week     Co-evaluation               AM-PAC PT "6 Clicks" Mobility  Outcome Measure Help needed turning from your back to your side while in a  flat bed without using bedrails?: A Little Help needed moving from lying on your back to sitting on the side of a flat bed without using bedrails?: A Little Help needed moving to and from a bed to a chair (including a wheelchair)?: None Help needed standing up from a chair using your arms (e.g., wheelchair or bedside chair)?: None Help needed to walk in hospital room?: A Little Help needed climbing 3-5 steps with a railing? : A Lot 6 Click Score: 19    End of Session Equipment Utilized During Treatment: Oxygen Activity Tolerance: Patient tolerated treatment well;Patient limited by fatigue Patient left: in chair;with call bell/phone within reach Nurse Communication: Mobility status PT Visit Diagnosis: Unsteadiness on feet (R26.81);Other abnormalities of gait and mobility (R26.89);Muscle weakness (generalized) (M62.81)    Time: 6578-4696 PT Time Calculation (min) (ACUTE ONLY): 26 min   Charges:   PT Evaluation $PT Eval Moderate Complexity: 1 Mod PT Treatments $Therapeutic Activity: 23-37 mins PT General Charges $$ ACUTE PT VISIT: 1 Visit         3:47 PM, 12/10/22 Ocie Bob,  MPT Physical Therapist with [facility] 336 [phone] office [phone] mobile phone

## 2022-12-10 NOTE — Assessment & Plan Note (Signed)
Multifactorial but mostly due to OHS ? Asthma component:  Re SABA :  I spent extra time with pt today reviewing appropriate use of albuterol for prn use on exertion with the following points: 1) saba is for relief of sob that does not improve by walking a slower pace or resting but rather if the pt does not improve after trying this first. 2) If the pt is convinced, as many are, that saba helps recover from activity faster then it's easy to tell if this is the case by re-challenging : ie stop, take the inhaler, then p 5 minutes try the exact same activity (intensity of workload) that just caused the symptoms and see if they are substantially diminished or not after saba 3) if there is an activity that reproducibly causes the symptoms, try the saba 15 min before the activity on alternate days   If in fact the saba really does help, then fine to continue to use it prn but advised may need to look closer at the maintenance regimen (for now Trelegy) being used to achieve better control of airways disease with exertion.

## 2022-12-10 NOTE — Plan of Care (Signed)
  Problem: Acute Rehab PT Goals(only PT should resolve) Goal: Pt Will Go Supine/Side To Sit Outcome: Progressing Flowsheets (Taken 12/10/2022 1548) Pt will go Supine/Side to Sit:  with modified independence  with supervision Goal: Patient Will Transfer Sit To/From Stand Outcome: Progressing Flowsheets (Taken 12/10/2022 1548) Patient will transfer sit to/from stand:  with modified independence  with supervision Goal: Pt Will Transfer Bed To Chair/Chair To Bed Outcome: Progressing Flowsheets (Taken 12/10/2022 1548) Pt will Transfer Bed to Chair/Chair to Bed:  with modified independence  with supervision Goal: Pt Will Ambulate Outcome: Progressing Flowsheets (Taken 12/10/2022 1548) Pt will Ambulate:  50 feet  with modified independence  with supervision  with rolling walker   3:48 PM, 12/10/22 Ocie Bob, MPT Physical Therapist with [facility] [phone] office [phone] mobile phone

## 2022-12-10 NOTE — TOC Progression Note (Signed)
Transition of Care [facility]) - Progression Note    Patient Details  Name: Jesus Rodriguez MRN: [id number] Date of Birth: [birth year]  [facility]) CM/SW Contact  Annice Needy, LCSW Phone Number: 12/10/2022, 12:53 PM  Clinical Narrative:    Patient is agreeable to have HHPT. He was previously with [facility] and would like to have them again.    Expected Discharge Plan: Home w Home Health Services Barriers to Discharge: Continued Medical Work up  Expected Discharge Plan and Services In-house Referral: Clinical Social Work     Living arrangements for the past 2 months: Single Family Home                           HH Arranged: PT HH Agency: [facility] Date HH Agency Contacted: 12/10/22 Time HH Agency Contacted: 1253 Representative spoke with at [facility] Agency: Clifton Custard   Social Determinants of Health (SDOH) Interventions SDOH Screenings   Food Insecurity: No Food Insecurity (12/09/2022)  Housing: Low Risk  (12/09/2022)  Transportation Needs: No Transportation Needs (12/09/2022)  Utilities: Not At Risk (12/09/2022)  Alcohol Screen: Low Risk  (11/05/2021)  Depression (PHQ2-9): Low Risk  (12/30/2021)  Financial Resource Strain: Low Risk  (11/05/2021)  Physical Activity: Inactive (11/05/2021)  Social Connections: Socially Isolated (11/05/2021)  Stress: No Stress Concern Present (11/05/2021)  Tobacco Use: Medium Risk (12/10/2022)    Readmission Risk Interventions    09/20/2021   12:37 PM 11/27/2020    1:12 PM  Readmission Risk Prevention Plan  Transportation Screening Complete Complete  PCP or Specialist Appt within 5-7 Days Not Complete Not Complete  Home Care Screening Complete Complete  Medication Review (RN CM) Complete Complete

## 2022-12-10 NOTE — Progress Notes (Signed)
   12/10/22 1225  Spiritual Encounters  Type of Visit Initial  Care provided to: Patient  Referral source Patient request  Reason for visit Advance directives  OnCall Visit No  Spiritual Framework  Presenting Themes Goals in life/care;Courage hope and growth  Community/Connection Family  Patient Stress Factors Health changes  Family Stress Factors None identified  Interventions  Spiritual Care Interventions Made Established relationship of care and support;Meaning making  Intervention Outcomes  Outcomes Connection to spiritual care;Awareness of support  Spiritual Care Plan  Spiritual Care Issues Still Outstanding No further spiritual care needs at this time (see row info)    Patient stated today that he would like his sister to be his HCPOA; Epic updated; documents given to patient. He wants to look over them with his sister and will get them notarized and completed at a later date.  No further spiritual care needs at this time.   Rev. Jolyn Lent, M.Div Chaplain

## 2022-12-11 DIAGNOSIS — J9621 Acute and chronic respiratory failure with hypoxia: Secondary | ICD-10-CM | POA: Diagnosis not present

## 2022-12-11 DIAGNOSIS — E876 Hypokalemia: Principal | ICD-10-CM

## 2022-12-11 DIAGNOSIS — G4733 Obstructive sleep apnea (adult) (pediatric): Secondary | ICD-10-CM | POA: Diagnosis not present

## 2022-12-11 DIAGNOSIS — I5033 Acute on chronic diastolic (congestive) heart failure: Secondary | ICD-10-CM | POA: Diagnosis not present

## 2022-12-11 LAB — BASIC METABOLIC PANEL
BUN: 15 mg/dL (ref 8–23)
CO2: >45 mmol/L — ABNORMAL HIGH (ref 22–32)
Calcium: 9 mg/dL (ref 8.9–10.3)
Chloride: 81 mmol/L — ABNORMAL LOW (ref 98–111)
Creatinine, Ser: 0.72 mg/dL (ref 0.61–1.24)
GFR, Estimated: >60 mL/min (ref >60)
Glucose, Bld: 105 mg/dL — ABNORMAL HIGH (ref 70–99)
Potassium: 2.9 mmol/L — ABNORMAL LOW (ref 3.5–5.1)
Sodium: 139 mmol/L (ref 135–145)

## 2022-12-11 MED ORDER — METOLAZONE 2.5 MG PO TABS: 2.5000 mg | ORAL_TABLET | ORAL | 0 refills | Status: DC

## 2022-12-11 MED ORDER — POTASSIUM CHLORIDE CRYS ER 20 MEQ PO TBCR: 40.0000 meq | EXTENDED_RELEASE_TABLET | Freq: Every day | ORAL | 1 refills | Status: DC

## 2022-12-11 MED ORDER — DOXYCYCLINE HYCLATE 100 MG PO TABS: 100.0000 mg | ORAL_TABLET | Freq: Every day | ORAL | 0 refills | Status: AC

## 2022-12-11 NOTE — TOC Transition Note (Signed)
Transition of Care [facility]) - CM/SW Discharge Note   Patient Details  Name: Jesus Rodriguez MRN: [id number] Date of Birth: 02/16/1951  [facility] Care [facility]) CM/SW Contact:  Annice Needy, LCSW Phone Number: 12/11/2022, 12:40 PM   Clinical Narrative:    Patient broke his rollator in his fall. Rollator ordered via [employer].      Barriers to Discharge: Continued Medical Work up   Patient Goals and CMS Choice      Discharge Placement                         Discharge Plan and Services Additional resources added to the After Visit Summary for   In-house Referral: Clinical Social Work              DME Arranged: Dan Humphreys rolling with seat DME Agency: [employer] Date DME Agency Contacted: 12/11/22 Time DME Agency Contacted: 1238 Representative spoke with at DME Agency: Ian Malkin HH Arranged: PT HH Agency: [facility] Date [facility] Agency Contacted: 12/10/22 Time HH Agency Contacted: 1253 Representative spoke with at [facility] Agency: Clifton Custard  Social Determinants of Health (SDOH) Interventions SDOH Screenings   Food Insecurity: No Food Insecurity (12/09/2022)  Housing: Low Risk  (12/09/2022)  Transportation Needs: No Transportation Needs (12/09/2022)  Utilities: Not At Risk (12/09/2022)  Alcohol Screen: Low Risk  (11/05/2021)  Depression (PHQ2-9): Low Risk  (12/30/2021)  Financial Resource Strain: Low Risk  (11/05/2021)  Physical Activity: Inactive (11/05/2021)  Social Connections: Socially Isolated (11/05/2021)  Stress: No Stress Concern Present (11/05/2021)  Tobacco Use: Medium Risk (12/10/2022)     Readmission Risk Interventions    09/20/2021   12:37 PM 11/27/2020    1:12 PM  Readmission Risk Prevention Plan  Transportation Screening Complete Complete  PCP or Specialist Appt within 5-7 Days Not Complete Not Complete  Home Care Screening Complete Complete  Medication Review (RN CM) Complete Complete

## 2022-12-11 NOTE — Progress Notes (Signed)
Pt expressed that O2 sat drop when he stands and transfers. This is his baseline. This nurse and two NT, assisted pt with standing. Pt's O2 sat while sitting on 5L, 98% and 89 on 5L while standing.

## 2022-12-11 NOTE — Discharge Summary (Signed)
Physician Discharge Summary   Patient: Khylar Kepple MRN: [id number] DOB: 10-15-1950  Admit date:     12/08/2022  Discharge date: 12/11/22  Discharge Physician: Vassie Loll   PCP: System, Provider Not In   Recommendations at discharge:  Repeat basic metabolic panel to follow electrolytes and renal function Continue assisting patient with weight loss management Make sure patient follow-up with cardiology service as instructed Reassessed patient's volume status with further adjustment to diuretic regimen as needed.   Discharge Diagnoses: Principal Problem:   Acute on chronic respiratory failure with hypoxia and hypercarbia Active Problems:   Obstructive sleep apnea   Obesity, Class III, BMI 40-49.9 (morbid obesity) (HCC)   Acute on chronic diastolic HF (heart failure) (HCC)   Paroxysmal atrial fibrillation (HCC)   Hypokalemia Physical deconditioning  Brief hospital admission narrative course: As per H&P written by Dr. Thomes Dinning on 12/09/2022 Decameron Hood is a 72 y.o. male with medical history significant of diastolic CHF, chronic atrial fibrillation on Eliquis, OSA on CPAP, morbid obesity, chronic hypoxia on 4 L nasal cannula at baseline, hypertension, hyperlipidemia, chronic anxiety/depression who presents to the emergency department due to a fall sustained at home. Patient states that he sustained a fall while walking with a rollator and home oxygen tank, his leg got caught up in the oxygen tube and sustained a fall landing on the oxygen tank with left side of his chest, patient states that he called his sister who activated 911, but he was down for about 30 to 40 minutes before help arrived.  He was requiring more oxygen at 6 LPM.  He denies hitting his head, but endorsed right forearm abrasion. Patient was recently admitted from 6/16 to 6/19 due to community-acquired pneumonia which was initially treated with vancomycin and cefepime and was then transition to ceftriaxone and  azithromycin and was discharged on oral doxycycline to complete course.   ED Course:  In emergency department, respiratory was 24/min on arrival to the ED, HR was 102/min, BP 111/50, temperature 99.5 F and O2 sat was 94% on NRB.  Workup in the ED showed normocytic anemia.  BMP showed sodium 138, potassium 2.9, chloride 82, bicarb 43, blood glucose 179, BUN 14, creatinine 0.77.  Magnesium 2.1, troponin x 1 was 5.  BNP 168 (this was 130 on 09/28/2022). CT head without contrast showed no acute intracranial abnormalities. CT angiography of chest with contrast showed no evidence of significant pulmonary embolus.  Cardiac enlargement with pulmonary vascular congestion and interstitial edema.  Bilateral pleural effusion with bibasilar consolidation and volume loss, possibly compressive atelectasis or pneumonia.  Similar appearance to previous study Chest x-ray showed cardiomegaly with pulmonary vascular congestion.  Interstitial prominence bilaterally with airspace disease at the lung bases, possible edema or infiltrate.  Small to moderate bilateral pleural effusion. Potassium was replenished, magnesium was given, Tylenol was given.  Hospitalist was asked admit patient for further evaluation and management.    Assessment and Plan: 1-acute on chronic respiratory failure with hypoxia -In the setting of acute on chronic diastolic heart failure and pulmonary vascular congestion -Will continue oxygen supplementation with plan to wean off down to patient's baseline based on improvement in his volume status and diuresis -Continue to follow daily weights and strict intake and output. -After adequate diuresis with IV, patient's diuretic has been transitioned to oral torsemide at adjusted dose along with metolazone once a week.   -Close observation to renal function electrolytes will be provided with repletion as needed. -2D echo demonstrating preserved ejection fraction, no  wall motion abnormalities and no  significant valvular disorder. -Low-sodium diet and daily weight has been discussed with patient. -Patient oxygen saturation at baseline on his chronic supplementation.   2-hypokalemia -Repleted -Discharged on daily supplementation.   3-history of paroxysmal atrial fibrillation -Currently sinus rhythm appreciated -Continue telemetry monitoring -Continue the use of metoprolol and Eliquis   4-obstructive sleep apnea -Continue CPAP nightly. -Patient educated about compliance times.   5-morbid obesity -Body mass index is 41.21 kg/m. -Low-calorie diet, portion control and increase physical activity discussed with patient.   6-chronic lymphedema/stasis dermatitis -Patient reported multiple recurrent episodes of lower extremity cellulitis and having started on suppressive therapy using doxycycline by his PCP. -  7-physical deconditioning -Patient has been seen by physical therapy with recommendation for home health PT at discharge. -TOC has been arranged and orders placed.  Consultants: None Procedures performed: See below for x-ray report. Disposition: Home with home health services. Diet recommendation: Heart healthy/low-sodium diet and low calorie diet.  DISCHARGE MEDICATION: Allergies as of 12/11/2022       Reactions   Celebrex [celecoxib]    Methotrexate Derivatives Other (See Comments)   Arrhythmia    Prednisone Other (See Comments)   Throwing up   Sulfa Antibiotics         Medication List     TAKE these medications    acetaminophen 500 MG tablet Commonly known as: TYLENOL Take 500 mg by mouth every 6 (six) hours as needed for moderate pain.   albuterol (2.5 MG/3ML) 0.083% nebulizer solution Commonly known as: PROVENTIL Take 3 mLs (2.5 mg total) by nebulization every 4 (four) hours as needed for wheezing or shortness of breath.   atorvastatin 10 MG tablet Commonly known as: LIPITOR TAKE 1 TABLET BY MOUTH EVERY DAY   cetirizine 10 MG tablet Commonly  known as: ZYRTEC Take 10 mg by mouth daily.   doxycycline 100 MG tablet Commonly known as: VIBRA-TABS Take 1 tablet (100 mg total) by mouth daily.   Eliquis 5 MG Tabs tablet Generic drug: apixaban TAKE 1 TABLET BY MOUTH TWICE A DAY   metolazone 2.5 MG tablet Commonly known as: ZAROXOLYN Take 1 tablet (2.5 mg total) by mouth every Wednesday. Start taking on: December 17, 2022   metoprolol tartrate 25 MG tablet Commonly known as: LOPRESSOR Take 25 mg by mouth 2 (two) times daily.   Mucinex 600 MG 12 hr tablet Generic drug: guaiFENesin Take 1,200 mg by mouth 2 (two) times daily.   potassium chloride SA 20 MEQ tablet Commonly known as: KLOR-CON M Take 2 tablets (40 mEq total) by mouth daily. Start taking on: December 12, 2022   torsemide 20 MG tablet Commonly known as: DEMADEX Take 4 tablets (80 mg total) by mouth 2 (two) times daily.   Trelegy Ellipta 100-62.5-25 MCG/ACT Aepb Generic drug: Fluticasone-Umeclidin-Vilant Inhale 1 puff into the lungs daily.   triamcinolone cream 0.1 % Commonly known as: KENALOG Apply 1 application  topically 3 (three) times daily as needed (irritation).        Follow-up Information     Branch, Dorothe Pea, MD. Schedule an appointment as soon as possible for a visit in 2 week(s).   Specialty: Cardiology Contact information: [address] [place] [address] [phone]                Discharge Exam: Ceasar Mons Weights   12/10/22 1131 12/11/22 0355  Weight: (!) 145.6 kg (!) 144.6 kg   General exam: Alert, awake, oriented x 3; reporting improvement in  his breathing and feeling ready to go home.  Good oxygen saturation on chronic supplementation. Respiratory system: No using accessory muscles; creased breath sounds at the bases; no frank crackles. Cardiovascular system:RRR. No rubs or gallops; unable to assess JVD with body habitus. Gastrointestinal system: Abdomen is obese, nondistended, soft and nontender. No organomegaly or  masses felt. Normal bowel sounds heard. Central nervous system: No focal neurological deficits. Extremities: No cyanosis or clubbing; 2+ edema bilaterally. Skin: No petechiae; positive stasis dermatitis changes appreciated on his legs bilaterally.   Psychiatry: Judgement and insight appear normal. Mood & affect appropriate.   Condition at discharge: Stable and improved.  The results of significant diagnostics from this hospitalization (including imaging, microbiology, ancillary and laboratory) are listed below for reference.   Imaging Studies: ECHOCARDIOGRAM LIMITED  Result Date: 12/09/2022    ECHOCARDIOGRAM LIMITED REPORT   Patient Name:   GAEL HINSDALE Date of Exam: 12/09/2022 Medical Rec #:  [id number]      Height:       74.0 in Accession #:    [id number]     Weight:       341.0 lb Date of Birth:  1950-10-04      BSA:          2.727 m Patient Age:    72 years       BP:           149/74 mmHg Patient Gender: M              HR:           91 bpm. [facility] Procedure: Limited Echo and Intracardiac Opacification Agent Indications:    CHF-Acute Diastolic I50.31 / Evaluate LVEF  History:        Patient has prior history of Echocardiogram examinations, most                 recent 10/01/2022. CHF, COPD, Arrythmias:Atrial Fibrillation;                 Risk Factors:Hypertension, Dyslipidemia and Hx of Smoking. OSA                 on CPAP.  Sonographer:    Celesta Gentile RCS Referring Phys: [id number] OLADAPO ADEFESO IMPRESSIONS  1. Left ventricular ejection fraction, by estimation, is 55 to 60%. The left ventricle has normal function. The left ventricle has no regional wall motion abnormalities.  2. Right ventricular systolic function is low normal. The right ventricular size is moderately enlarged.  3. Limited echo evaluate LV function FINDINGS  Left Ventricle: Left ventricular ejection fraction, by estimation, is 55 to 60%. The left ventricle has normal function. The left ventricle has no regional  wall motion abnormalities. Definity contrast agent was given IV to delineate the left ventricular  endocardial borders. Right Ventricle: The right ventricular size is moderately enlarged. Right vetricular wall thickness was not well visualized. Right ventricular systolic function is low normal. LEFT VENTRICLE PLAX 2D LVIDd:         5.75 cm LVIDs:         4.30 cm LV PW:         1.00 cm LV IVS:        1.00 cm LVOT diam:     2.40 cm LVOT Area:     4.52 cm  RIGHT VENTRICLE         IVC TAPSE (M-mode): 1.9 cm  IVC diam: 2.70 cm LEFT ATRIUM  Index LA diam:    5.00 cm 1.83 cm/m   AORTA Ao Root diam: 4.40 cm  SHUNTS Systemic Diam: 2.40 cm Dina Rich MD Electronically signed by Dina Rich MD Signature Date/Time: 12/09/2022/3:53:10 PM    Final    CT Head Wo Contrast  Result Date: 12/08/2022 CLINICAL DATA:  Minor head trauma. Trip and fall injury. Decreased oxygen saturation. EXAM: CT HEAD WITHOUT CONTRAST TECHNIQUE: Contiguous axial images were obtained from the base of the skull through the vertex without intravenous contrast. RADIATION DOSE REDUCTION: This exam was performed according to the departmental dose-optimization program which includes automated exposure control, adjustment of the mA and/or kV according to patient size and/or use of iterative reconstruction technique. COMPARISON:  None Available. FINDINGS: Brain: No evidence of acute infarction, hemorrhage, hydrocephalus, extra-axial collection or mass lesion/mass effect. Mild diffuse cerebral atrophy. Mild ventricular dilatation consistent with central atrophy. Vascular: No hyperdense vessel or unexpected calcification. Skull: Normal. Negative for fracture or focal lesion. Sinuses/Orbits: Mucosal thickening in the paranasal sinuses. No acute air-fluid levels. Mastoid air cells are clear. Other: Motion artifact limits examination. IMPRESSION: 1. No acute intracranial abnormalities.  Chronic atrophy. Electronically Signed   By: Burman Nieves  M.D.   On: 12/08/2022 23:37   CT Angio Chest PE W and/or Wo Contrast  Result Date: 12/08/2022 CLINICAL DATA:  Pulmonary embolus suspected with high probability. Trip and fall injury. Decreased oxygen saturation. EXAM: CT ANGIOGRAPHY CHEST WITH CONTRAST TECHNIQUE: Multidetector CT imaging of the chest was performed using the standard protocol during bolus administration of intravenous contrast. Multiplanar CT image reconstructions and MIPs were obtained to evaluate the vascular anatomy. RADIATION DOSE REDUCTION: This exam was performed according to the departmental dose-optimization program which includes automated exposure control, adjustment of the mA and/or kV according to patient size and/or use of iterative reconstruction technique. CONTRAST:  OMNIPAQUE IOHEXOL 350 MG/ML SOLN COMPARISON:  Chest radiograph 12/08/2022.  CT 09/28/2022 FINDINGS: Cardiovascular: Good opacification of the central and segmental pulmonary arteries. No focal filling defects. No evidence of significant pulmonary embolus. Cardiac enlargement. No pericardial effusions. Normal caliber thoracic aorta. No dissection. Mediastinum/Nodes: Esophagus is decompressed. Scattered lymph nodes throughout the mediastinum. Largest include a right paratracheal node measuring about 1.3 cm diameter. Similar appearance to previous study. Thyroid gland is unremarkable. Lungs/Pleura: Small bilateral pleural effusions, greater on the right. Consolidation and volume loss in the lung bases, possibly atelectasis or pneumonia. Diffuse interstitial thickening consistent with pulmonary edema. Similar appearance to previous study. Upper Abdomen: No acute abnormalities. Elevation of the left hemidiaphragm. Musculoskeletal: No chest wall abnormality. No acute or significant osseous findings. Diffuse degenerative changes in the spine. Review of the MIP images confirms the above findings. IMPRESSION: 1. No evidence of significant pulmonary embolus. 2. Cardiac  enlargement with pulmonary vascular congestion and interstitial edema. 3. Bilateral pleural effusions with basilar consolidation and volume loss, possibly compressive atelectasis or pneumonia. Similar appearance to previous study. 4. Prominent lymph nodes in the mediastinum without change, likely reactive. 5. Aortic atherosclerosis. Electronically Signed   By: Burman Nieves M.D.   On: 12/08/2022 23:35   DG Chest Portable 1 View  Result Date: 12/08/2022 CLINICAL DATA:  Hypoxia. Fall at home. History of COPD, CHF, and AFib. EXAM: PORTABLE CHEST 1 VIEW COMPARISON:  10/01/2022. FINDINGS: The heart is enlarged and the mediastinal contour is within normal limits. The pulmonary vasculature is distended. There are small to moderate bilateral pleural effusions with edema or infiltrate at the lung bases. Interstitial prominence is noted bilaterally. No  pneumothorax. No acute osseous abnormality. IMPRESSION: 1. Cardiomegaly with pulmonary vascular congestion. 2. Interstitial prominence bilaterally with airspace disease at the lung bases, possible edema or infiltrate. 3. Small to moderate bilateral pleural effusions. Electronically Signed   By: Thornell Sartorius M.D.   On: 12/08/2022 20:13    Microbiology: Results for orders placed or performed during the hospital encounter of 09/28/22  Resp panel by RT-PCR (RSV, Flu A&B, Covid) Anterior Nasal Swab     Status: None   Collection Time: 09/28/22 10:10 PM   Specimen: Anterior Nasal Swab  Result Value Ref Range Status   SARS Coronavirus 2 by RT PCR NEGATIVE NEGATIVE Final    Comment: (NOTE) SARS-CoV-2 target nucleic acids are NOT DETECTED.  The SARS-CoV-2 RNA is generally detectable in upper respiratory specimens during the acute phase of infection. The lowest concentration of SARS-CoV-2 viral copies this assay can detect is 138 copies/mL. A negative result does not preclude SARS-Cov-2 infection and should not be used as the sole basis for treatment or other  patient management decisions. A negative result may occur with  improper specimen collection/handling, submission of specimen other than nasopharyngeal swab, presence of viral mutation(s) within the areas targeted by this assay, and inadequate number of viral copies(<138 copies/mL). A negative result must be combined with clinical observations, patient history, and epidemiological information. The expected result is Negative.  Fact Sheet for Patients:  BloggerCourse.com  Fact Sheet for Healthcare Providers:  SeriousBroker.it  This test is no t yet approved or cleared by the [place] FDA and  has been authorized for detection and/or diagnosis of SARS-CoV-2 by FDA under an Emergency Use Authorization (EUA). This EUA will remain  in effect (meaning this test can be used) for the duration of the COVID-19 declaration under Section 564(b)(1) of the Act, 21 U.S.C.section 360bbb-3(b)(1), unless the authorization is terminated  or revoked sooner.       Influenza A by PCR NEGATIVE NEGATIVE Final   Influenza B by PCR NEGATIVE NEGATIVE Final    Comment: (NOTE) The Xpert Xpress SARS-CoV-2/FLU/RSV plus assay is intended as an aid in the diagnosis of influenza from Nasopharyngeal swab specimens and should not be used as a sole basis for treatment. Nasal washings and aspirates are unacceptable for Xpert Xpress SARS-CoV-2/FLU/RSV testing.  Fact Sheet for Patients: BloggerCourse.com  Fact Sheet for Healthcare Providers: SeriousBroker.it  This test is not yet approved or cleared by the [place] FDA and has been authorized for detection and/or diagnosis of SARS-CoV-2 by FDA under an Emergency Use Authorization (EUA). This EUA will remain in effect (meaning this test can be used) for the duration of the COVID-19 declaration under Section 564(b)(1) of the Act, 21 U.S.C. section  360bbb-3(b)(1), unless the authorization is terminated or revoked.     Resp Syncytial Virus by PCR NEGATIVE NEGATIVE Final    Comment: (NOTE) Fact Sheet for Patients: BloggerCourse.com  Fact Sheet for Healthcare Providers: SeriousBroker.it  This test is not yet approved or cleared by the [place] FDA and has been authorized for detection and/or diagnosis of SARS-CoV-2 by FDA under an Emergency Use Authorization (EUA). This EUA will remain in effect (meaning this test can be used) for the duration of the COVID-19 declaration under Section 564(b)(1) of the Act, 21 U.S.C. section 360bbb-3(b)(1), unless the authorization is terminated or revoked.  Performed at [facility], [address], [place], [place] [address]   MRSA Next Gen by PCR, Nasal     Status: None   Collection Time: 09/30/22  6:41 PM   Specimen: Nasal Mucosa; Nasal Swab  Result Value Ref Range Status   MRSA by PCR Next Gen NOT DETECTED NOT DETECTED Final    Comment: (NOTE) The GeneXpert MRSA Assay (FDA approved for NASAL specimens only), is one component of a comprehensive MRSA colonization surveillance program. It is not intended to diagnose MRSA infection nor to guide or monitor treatment for MRSA infections. Test performance is not FDA approved in patients less than 38 years old. Performed at [facility], [address] [place], [place] [address]     Labs: CBC: Recent Labs  Lab 12/08/22 1442 12/08/22 1719 12/09/22 0525 12/10/22 0357  WBC 8.4 8.0 7.0 7.9  NEUTROABS 5.6 6.1  --   --   HGB 10.1* 9.2* 9.4* 10.0*  HCT 30.7* 29.5* 29.9* 33.0*  MCV 90 92.8 92.6 95.9  PLT 204 180 165 151   Basic Metabolic Panel: Recent Labs  Lab 12/08/22 1719 12/08/22 2159 12/09/22 0140 12/09/22 0525 12/10/22 0357 12/11/22 0409  NA 136  --  138 139 139 139  K 2.2* 2.5* 2.9* 2.7* 3.1* 2.9*  CL 79*  --  82* 84* 86* 81*  CO2 42*  --  43* >45* 44* >45*  GLUCOSE  129*  --  179* 116* 116* 105*  BUN 15  --  14 13 11 15   CREATININE 0.83  --  0.77 0.73 0.70 0.72  CALCIUM 8.5*  --  8.6* 8.7* 8.7* 9.0  MG  --  2.1  --   --  2.5*  --   PHOS  --   --   --  2.8  --   --    Liver Function Tests: Recent Labs  Lab 12/08/22 1719 12/09/22 0525 12/10/22 0357  AST 17 25 28   ALT 8 10 10   ALKPHOS 70 77 75  BILITOT 1.0 1.2 1.0  PROT 6.4* 6.6 6.7  ALBUMIN 3.0* 3.1* 3.1*   CBG: Recent Labs  Lab 12/09/22 1558  GLUCAP 118*    Discharge time spent: greater than 30 minutes.  Signed: Vassie Loll, MD [facility] 12/11/2022

## 2022-12-17 NOTE — ED Provider Notes (Signed)
[facility] MEDICAL SURGICAL UNIT Provider Note  CSN: [id number] Arrival date & time: 12/08/22 1702  Chief Complaint(s) Fall and hypoxia  HPI Jesus Rodriguez is a 72 y.o. male with PMH chronic A-fib on Eliquis, CHF, OSA, obesity, chronic hypoxia on 4 L nasal cannula, HTN, HLD who presents emerged part for evaluation of hypoxia and a fall.  Patient states that he was in the doorway and tripped while using his walker.  Denies head strike or loss of consciousness.  EMS in the field reportedly found the patient to be persistently hypoxic despite his home oxygen and patient arrives with a nonrebreather in place.  He endorses abrasions to bilateral forearms but is denying chest pain, abdominal pain, nausea, vomiting, headache, fever, numbness, tingling, weakness or other systemic, neurologic or traumatic complaints.   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   Sleep apnea 02/13/2016   Patient Active Problem List   Diagnosis Date Noted   Hypokalemia 12/11/2022   Paroxysmal atrial fibrillation (HCC) 12/09/2022   DOE (dyspnea on exertion) 12/08/2022   Hearing loss 10/27/2022   Obstructive sleep apnea of adult 10/27/2022   Multifocal pneumonia 09/29/2022   Bilateral pleural effusion 09/29/2022   Obesity (BMI 30-39.9) 09/29/2022   Non-compliance with treatment 08/18/2022   Pulmonary hypertension (HCC) 04/10/2022   Respiratory acidosis 04/10/2022   Metabolic encephalopathy 04/09/2022   Obesity hypoventilation syndrome (HCC) 01/06/2022   CHF (congestive heart  failure) (HCC) 01/03/2022   Respiratory distress 09/30/2021   Acute on chronic diastolic CHF (congestive heart failure) (HCC) 09/17/2021   Acute on chronic respiratory failure with hypoxia and hypercarbia 09/17/2021   Mixed hyperlipidemia 09/17/2021   Dependence on nocturnal oxygen therapy 04/25/2021   Hypotension 11/25/2020   AKI (acute kidney injury) (HCC) 11/24/2020   Chronic respiratory failure with hypoxia (HCC) 11/24/2020   Chronic diastolic CHF (congestive heart failure) (HCC) 11/24/2020   Tobacco use disorder 11/21/2020   Mild protein-calorie malnutrition (HCC) 11/21/2020   SBO (small bowel obstruction) (HCC) 11/20/2020   Obesity, Class III, BMI 40-49.9 (morbid obesity) (HCC)    COPD with acute exacerbation (HCC)    Acute on chronic diastolic HF (heart failure) (HCC)    Pressure injury of skin 11/28/2019   Acute respiratory failure with hypoxia (HCC) 11/27/2019   Persistent atrial fibrillation with RVR (HCC) 05/11/2018   History of rheumatoid arthritis seronegative 09/10/2017   Typical atrial flutter (HCC) 04/11/2016   Long-term (current) use of anticoagulants, INR goal 2.0-3.0 03/18/2016   High risk medications (not anticoagulants) long-term use 02/14/2016   Class 3 obesity in adult 02/14/2016   Depression 02/14/2016   Osteoarthritis of both hands 02/13/2016   Osteoarthritis of both knees 02/13/2016   DDD (degenerative disc disease), lumbar 02/13/2016   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 Authorizing Provider  acetaminophen (TYLENOL) 500 MG tablet Take 500 mg by mouth every 6 (six) hours as  needed for moderate pain.   Yes [provider]  albuterol (PROVENTIL) (2.5 MG/3ML) 0.083% nebulizer solution Take 3 mLs (2.5 mg total) by nebulization every 4 (four) hours as needed for wheezing or shortness of breath. 10/22/22  Yes Coralyn Helling, MD  apixaban (ELIQUIS) 5 MG TABS tablet TAKE 1 TABLET BY MOUTH TWICE A DAY 09/01/22  Yes Branch, Dorothe Pea, MD  atorvastatin (LIPITOR) 10 MG tablet TAKE 1 TABLET BY MOUTH EVERY DAY 10/30/22  Yes Branch, Dorothe Pea, MD  cetirizine (ZYRTEC) 10 MG tablet Take 10 mg by mouth daily.   Yes [provider]  doxycycline (VIBRA-TABS) 100 MG tablet Take 1 tablet (100 mg total) by mouth daily. 12/11/22  Yes Vassie Loll, MD  Fluticasone-Umeclidin-Vilant (TRELEGY ELLIPTA) 100-62.5-25 MCG/ACT AEPB Inhale 1 puff into the lungs daily. 10/27/22  Yes [name], MD  guaiFENesin (MUCINEX) 600 MG 12 hr tablet Take 1,200 mg by mouth 2 (two) times daily.   Yes [provider]  metolazone (ZAROXOLYN) 2.5 MG tablet Take 1 tablet (2.5 mg total) by mouth every Wednesday. 12/17/22  Yes Vassie Loll, MD  metoprolol tartrate (LOPRESSOR) 25 MG tablet Take 25 mg by mouth 2 (two) times daily.   Yes [provider]  torsemide (DEMADEX) 20 MG tablet Take 4 tablets (80 mg total) by mouth 2 (two) times daily. 11/06/22 02/04/23 Yes Branch, Dorothe Pea, MD  triamcinolone (KENALOG) 0.1 % Apply 1 application  topically 3 (three) times daily as needed (irritation). 02/25/20  Yes [provider]  potassium chloride SA (KLOR-CON M) 20 MEQ tablet Take 2 tablets (40 mEq total) by mouth daily. 12/12/22   Vassie Loll, MD                                                                                                                                    Past Surgical History Past Surgical History:  Procedure Laterality Date   A-FLUTTER ABLATION     at [facility] 2018   CARDIOVERSION  02/10/2018   CARDIOVERSION N/A 05/13/2018   Procedure: CARDIOVERSION;   Surgeon: Thurmon Fair, MD;  Location: [facility] ENDOSCOPY;  Service: Cardiovascular;  Laterality: N/A;   CATARACT EXTRACTION     Family History Family History  Problem Relation Age of Onset   Hypertension Father    Heart attack Father    Prostate cancer Father    Hypertension Sister    Hypertension Sister    Hypertension Sister    Epilepsy Son    Diabetes Son     Social History Social History   Tobacco Use   Smoking status: Former    Current packs/day: 0.00    Average packs/day: 2.0 packs/day for 50.5 years (101.1 ttl pk-yrs)    Types: Cigarettes  Start date: 05/28/1971    Quit date: 12/2021    Years since quitting: 1.0    Passive exposure: Never   Smokeless tobacco: Never  Vaping Use   Vaping status: Never Used  Substance Use Topics   Alcohol use: No   Drug use: No   Allergies Celebrex [celecoxib], Methotrexate derivatives, Prednisone, and Sulfa antibiotics  Review of Systems Review of Systems  Respiratory:  Positive for shortness of breath.   Skin:  Positive for wound.    Physical Exam Vital Signs  I have reviewed the triage vital signs BP 112/70   Pulse 89   Temp 97.9 F (36.6 C) (Oral)   Resp 20   Ht 6\' 2"  (1.88 m)   Wt (!) 144.6 kg   SpO2 100%   BMI 40.92 kg/m   Physical Exam Constitutional:      General: He is not in acute distress.    Appearance: Normal appearance.  HENT:     Head: Normocephalic and atraumatic.     Nose: No congestion or rhinorrhea.  Eyes:     General:        Right eye: No discharge.        Left eye: No discharge.     Extraocular Movements: Extraocular movements intact.     Pupils: Pupils are equal, round, and reactive to light.  Cardiovascular:     Rate and Rhythm: Normal rate and regular rhythm.     Heart sounds: No murmur heard. Pulmonary:     Effort: Respiratory distress present.     Breath sounds: Rales present. No wheezing.  Abdominal:     General: There is no distension.     Tenderness: There is no abdominal  tenderness.  Musculoskeletal:        General: Normal range of motion.     Cervical back: Normal range of motion.  Skin:    General: Skin is warm and dry.     Findings: Lesion present.  Neurological:     General: No focal deficit present.     Mental Status: He is alert.     ED Results and Treatments Labs (all labs ordered are listed, but only abnormal results are displayed) Labs Reviewed  COMPREHENSIVE METABOLIC PANEL - Abnormal; Notable for the following components:      Result Value   Potassium 2.2 (*)    Chloride 79 (*)    CO2 42 (*)    Glucose, Bld 129 (*)    Calcium 8.5 (*)    Total Protein 6.4 (*)    Albumin 3.0 (*)    All other components within normal limits  CBC WITH DIFFERENTIAL/PLATELET - Abnormal; Notable for the following components:   RBC 3.18 (*)    Hemoglobin 9.2 (*)    HCT 29.5 (*)    Eosinophils Absolute 0.6 (*)    All other components within normal limits  BRAIN NATRIURETIC PEPTIDE - Abnormal; Notable for the following components:   B Natriuretic Peptide 168.0 (*)    All other components within normal limits  POTASSIUM - Abnormal; Notable for the following components:   Potassium 2.5 (*)    All other components within normal limits  BASIC METABOLIC PANEL - Abnormal; Notable for the following components:   Potassium 2.9 (*)    Chloride 82 (*)    CO2 43 (*)    Glucose, Bld 179 (*)    Calcium 8.6 (*)    All other components within normal limits  COMPREHENSIVE METABOLIC PANEL - Abnormal; Notable  for the following components:   Potassium 2.7 (*)    Chloride 84 (*)    CO2 >45 (*)    Glucose, Bld 116 (*)    Calcium 8.7 (*)    Albumin 3.1 (*)    All other components within normal limits  CBC - Abnormal; Notable for the following components:   RBC 3.23 (*)    Hemoglobin 9.4 (*)    HCT 29.9 (*)    All other components within normal limits  GLUCOSE, CAPILLARY - Abnormal; Notable for the following components:   Glucose-Capillary 118 (*)    All other  components within normal limits  COMPREHENSIVE METABOLIC PANEL - Abnormal; Notable for the following components:   Potassium 3.1 (*)    Chloride 86 (*)    CO2 44 (*)    Glucose, Bld 116 (*)    Calcium 8.7 (*)    Albumin 3.1 (*)    All other components within normal limits  MAGNESIUM - Abnormal; Notable for the following components:   Magnesium 2.5 (*)    All other components within normal limits  CBC - Abnormal; Notable for the following components:   RBC 3.44 (*)    Hemoglobin 10.0 (*)    HCT 33.0 (*)    All other components within normal limits  BASIC METABOLIC PANEL - Abnormal; Notable for the following components:   Potassium 2.9 (*)    Chloride 81 (*)    CO2 >45 (*)    Glucose, Bld 105 (*)    All other components within normal limits  MAGNESIUM  PHOSPHORUS  TROPONIN I (HIGH SENSITIVITY)  TROPONIN I (HIGH SENSITIVITY)                                                                                                                          Radiology No results found.  Pertinent labs & imaging results that were available during my care of the patient were reviewed by me and considered in my medical decision making (see MDM for details).  Medications Ordered in ED Medications  perflutren lipid microspheres (DEFINITY) IV suspension (4 mLs Intravenous Given 12/09/22 1345)  potassium chloride SA (KLOR-CON M) CR tablet 40 mEq (40 mEq Oral Given 12/08/22 2023)  magnesium oxide (MAG-OX) tablet 800 mg (800 mg Oral Given 12/08/22 2022)  potassium chloride 10 mEq in 100 mL IVPB (0 mEq Intravenous Stopped 12/08/22 2119)  iohexol (OMNIPAQUE) 350 MG/ML injection 100 mL (100 mLs Intravenous Contrast Given 12/08/22 2302)  potassium chloride SA (KLOR-CON M) CR tablet 40 mEq (40 mEq Oral Given 12/08/22 2331)  potassium chloride 10 mEq in 100 mL IVPB (0 mEq Intravenous Stopped 12/09/22 0036)  magnesium sulfate IVPB 2 g 50 mL (0 g Intravenous Stopped 12/09/22 0106)  HYDROcodone-acetaminophen  (NORCO/VICODIN) 5-325 MG per tablet 1 tablet (1 tablet Oral Given 12/09/22 0000)  potassium chloride 10 mEq in 100 mL IVPB (0 mEq Intravenous Stopped 12/09/22 0342)  potassium chloride SA (KLOR-CON M) CR tablet 40 mEq (  40 mEq Oral Given 12/09/22 0757)  potassium chloride 10 mEq in 100 mL IVPB (0 mEq Intravenous Stopped 12/09/22 1436)  metolazone (ZAROXOLYN) tablet 2.5 mg (2.5 mg Oral Given 12/11/22 0816)                                                                                                                                     Procedures .Critical Care  Performed by: Glendora Score, MD Authorized by: Glendora Score, MD   Critical care provider statement:    Critical care time (minutes):  30   Critical care was necessary to treat or prevent imminent or life-threatening deterioration of the following conditions:  Respiratory failure and metabolic crisis   Critical care was time spent personally by me on the following activities:  Development of treatment plan with patient or surrogate, discussions with consultants, evaluation of patient's response to treatment, examination of patient, ordering and review of laboratory studies, ordering and review of radiographic studies, ordering and performing treatments and interventions, pulse oximetry, re-evaluation of patient's condition and review of old charts   (including critical care time)  Medical Decision Making / ED Course   This patient presents to the ED for concern of shortness of breath, fall, this involves an extensive number of treatment options, and is a complaint that carries with it a high risk of complications and morbidity.  The differential diagnosis includes Pe, PTX, Pulmonary Edema, ARDS, COPD/Asthma, ACS, CHF exacerbation, Arrhythmia, Pericardial Effusion/Tamponade, Anemia, Sepsis, Acidosis/Hypercapnia, Anxiety, Viral URI  MDM: Patient seen emergency room for evaluation of a fall and hypoxia.  Physical exam with faint rales at  the bases bilaterally, abrasions to bilateral forearms but is otherwise unremarkable.  Neurologic exam unremarkable.  Laboratory evaluation with a hemoglobin of 9.2, initial potassium 2.7 with chloride 84, albumin 3.1.  High-sensitivity troponin is normal, BNP is 168.  Chest x-ray with vascular congestion and pulmonary infiltrates.  Patient initially refusing CT scan but after walking to the bathroom patient became significant more dyspneic and is requiring more oxygen than normal.  Requiring 6 L nasal cannula to maintain oxygen saturations.  Patient then agreed to advanced imaging.  CTs pending at time of signout.  Please see provider signout for continuation of workup.  Potassium repleted orally and IV upon reevaluation potassium only minimally improved to 2.5.  Ultimately will require admission for worsening oxygen requirement.   Additional history obtained: -Additional history obtained from wife -External records from outside source obtained and reviewed including: Chart review including previous notes, labs, imaging, consultation notes   Lab Tests: -I ordered, reviewed, and interpreted labs.   The pertinent results include:   Labs Reviewed  COMPREHENSIVE METABOLIC PANEL - Abnormal; Notable for the following components:      Result Value   Potassium 2.2 (*)    Chloride 79 (*)    CO2 42 (*)    Glucose, Bld 129 (*)    Calcium 8.5 (*)  Total Protein 6.4 (*)    Albumin 3.0 (*)    All other components within normal limits  CBC WITH DIFFERENTIAL/PLATELET - Abnormal; Notable for the following components:   RBC 3.18 (*)    Hemoglobin 9.2 (*)    HCT 29.5 (*)    Eosinophils Absolute 0.6 (*)    All other components within normal limits  BRAIN NATRIURETIC PEPTIDE - Abnormal; Notable for the following components:   B Natriuretic Peptide 168.0 (*)    All other components within normal limits  POTASSIUM - Abnormal; Notable for the following components:   Potassium 2.5 (*)    All other  components within normal limits  BASIC METABOLIC PANEL - Abnormal; Notable for the following components:   Potassium 2.9 (*)    Chloride 82 (*)    CO2 43 (*)    Glucose, Bld 179 (*)    Calcium 8.6 (*)    All other components within normal limits  COMPREHENSIVE METABOLIC PANEL - Abnormal; Notable for the following components:   Potassium 2.7 (*)    Chloride 84 (*)    CO2 >45 (*)    Glucose, Bld 116 (*)    Calcium 8.7 (*)    Albumin 3.1 (*)    All other components within normal limits  CBC - Abnormal; Notable for the following components:   RBC 3.23 (*)    Hemoglobin 9.4 (*)    HCT 29.9 (*)    All other components within normal limits  GLUCOSE, CAPILLARY - Abnormal; Notable for the following components:   Glucose-Capillary 118 (*)    All other components within normal limits  COMPREHENSIVE METABOLIC PANEL - Abnormal; Notable for the following components:   Potassium 3.1 (*)    Chloride 86 (*)    CO2 44 (*)    Glucose, Bld 116 (*)    Calcium 8.7 (*)    Albumin 3.1 (*)    All other components within normal limits  MAGNESIUM - Abnormal; Notable for the following components:   Magnesium 2.5 (*)    All other components within normal limits  CBC - Abnormal; Notable for the following components:   RBC 3.44 (*)    Hemoglobin 10.0 (*)    HCT 33.0 (*)    All other components within normal limits  BASIC METABOLIC PANEL - Abnormal; Notable for the following components:   Potassium 2.9 (*)    Chloride 81 (*)    CO2 >45 (*)    Glucose, Bld 105 (*)    All other components within normal limits  MAGNESIUM  PHOSPHORUS  TROPONIN I (HIGH SENSITIVITY)  TROPONIN I (HIGH SENSITIVITY)      EKG   EKG Interpretation Date/Time:  Monday December 08 2022 17:19:35 EDT Ventricular Rate:  94 PR Interval:    QRS Duration:  86 QT Interval:  370 QTC Calculation: 463 R Axis:   52  Text Interpretation: Atrial fibrillation Abnormal R-wave progression, early transition Borderline repolarization  abnormality Confirmed by Ross Marcus ([id number]) on 12/09/2022 7:32:45 PM         Imaging Studies ordered: I ordered imaging studies including chest x-ray I independently visualized and interpreted imaging. I agree with the radiologist interpretation  CT PE, CT head are pending   Medicines ordered and prescription drug management: Meds ordered this encounter  Medications   potassium chloride SA (KLOR-CON M) CR tablet 40 mEq   magnesium oxide (MAG-OX) tablet 800 mg   potassium chloride 10 mEq in 100 mL IVPB   iohexol (  OMNIPAQUE) 350 MG/ML injection 100 mL   potassium chloride SA (KLOR-CON M) CR tablet 40 mEq   potassium chloride 10 mEq in 100 mL IVPB   magnesium sulfate IVPB 2 g 50 mL   HYDROcodone-acetaminophen (NORCO/VICODIN) 5-325 MG per tablet 1 tablet   potassium chloride 10 mEq in 100 mL IVPB   DISCONTD: furosemide (LASIX) injection 40 mg   DISCONTD: acetaminophen (TYLENOL) tablet 650 mg   DISCONTD: acetaminophen (TYLENOL) suppository 650 mg   DISCONTD: ondansetron (ZOFRAN) tablet 4 mg   DISCONTD: ondansetron (ZOFRAN) injection 4 mg   DISCONTD: atorvastatin (LIPITOR) tablet 10 mg   DISCONTD: apixaban (ELIQUIS) tablet 5 mg   DISCONTD: albuterol (PROVENTIL) (2.5 MG/3ML) 0.083% nebulizer solution 2.5 mg   DISCONTD: metoprolol tartrate (LOPRESSOR) tablet 25 mg   DISCONTD: feeding supplement (ENSURE ENLIVE / ENSURE PLUS) liquid 237 mL   potassium chloride SA (KLOR-CON M) CR tablet 40 mEq   DISCONTD: potassium chloride 10 mEq in 100 mL IVPB   potassium chloride 10 mEq in 100 mL IVPB   DISCONTD: torsemide (DEMADEX) tablet 80 mg   DISCONTD: ALPRAZolam (XANAX) tablet 0.25 mg   perflutren lipid microspheres (DEFINITY) IV suspension   metolazone (ZAROXOLYN) tablet 2.5 mg   DISCONTD: potassium chloride SA (KLOR-CON M) CR tablet 40 mEq   potassium chloride SA (KLOR-CON M) 20 MEQ tablet    Sig: Take 2 tablets (40 mEq total) by mouth daily.    Dispense:  30 tablet    Refill:  1    metolazone (ZAROXOLYN) 2.5 MG tablet    Sig: Take 1 tablet (2.5 mg total) by mouth every Wednesday.    Dispense:  20 tablet    Refill:  0   doxycycline (VIBRA-TABS) 100 MG tablet    Sig: Take 1 tablet (100 mg total) by mouth daily.    Dispense:  30 tablet    Refill:  0    -I have reviewed the patients home medicines and have made adjustments as needed  Critical interventions Oxygen supplementation, potassium repletion   Cardiac Monitoring: The patient was maintained on a cardiac monitor.  I personally viewed and interpreted the cardiac monitored which showed an underlying rhythm of: NSR  Social Determinants of Health:  Factors impacting patients care include: none   Reevaluation: After the interventions noted above, I reevaluated the patient and found that they have :improved  Co morbidities that complicate the patient evaluation  Past Medical History:  Diagnosis Date   Anxiety 02/13/2016   Atrial flutter (HCC) 02/13/2016   s/p CTI ablation at [facility]   Chronic a-fib [facility])    DDD (degenerative disc disease), lumbar 02/13/2016   Essential hypertension 02/13/2016   Insomnia 02/13/2016   Mixed hyperlipidemia 02/13/2016   Osteoarthritis of both hands    Persistent atrial fibrillation (HCC)    RA (rheumatoid arthritis) (HCC) 02/13/2016   Sero Negative   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

## 2022-12-30 ENCOUNTER — Ambulatory Visit: Payer: Medicare Other | Admitting: Nurse Practitioner

## 2023-01-03 ENCOUNTER — Other Ambulatory Visit: Payer: Self-pay | Admitting: Cardiology

## 2023-01-05 ENCOUNTER — Other Ambulatory Visit: Payer: Self-pay | Admitting: Cardiology

## 2023-01-10 ENCOUNTER — Encounter: Payer: Self-pay | Admitting: Cardiology

## 2023-01-12 ENCOUNTER — Other Ambulatory Visit: Payer: Self-pay

## 2023-01-12 MED ORDER — TORSEMIDE 20 MG PO TABS: 80.0000 mg | ORAL_TABLET | Freq: Two times a day (BID) | ORAL | 3 refills | Status: DC

## 2023-01-16 ENCOUNTER — Telehealth: Payer: Self-pay | Admitting: Internal Medicine

## 2023-01-16 NOTE — Telephone Encounter (Signed)
Morrie Sheldon would like to know if patient is using oxygen, Morrie Sheldon phone number is [phone].

## 2023-01-21 NOTE — Telephone Encounter (Signed)
Spoke to [place] with [facility]. He stated that Jesus Rodriguez was calling to verify oxygen usage, however Rx has been received from our office.  Nothing further needed.

## 2023-01-22 ENCOUNTER — Ambulatory Visit: Payer: Medicare Other | Admitting: Nurse Practitioner

## 2023-02-01 ENCOUNTER — Other Ambulatory Visit: Payer: Self-pay | Admitting: Cardiology

## 2023-02-25 ENCOUNTER — Other Ambulatory Visit: Payer: Self-pay | Admitting: Cardiology

## 2023-03-02 ENCOUNTER — Encounter: Payer: Self-pay | Admitting: Nurse Practitioner

## 2023-03-02 ENCOUNTER — Ambulatory Visit: Payer: Medicare Other | Attending: Cardiology | Admitting: Nurse Practitioner

## 2023-03-02 VITALS — BP 120/72 | HR 60 | Ht 74.0 in | Wt 333.0 lb

## 2023-03-02 DIAGNOSIS — I5032 Chronic diastolic (congestive) heart failure: Secondary | ICD-10-CM

## 2023-03-02 DIAGNOSIS — I10 Essential (primary) hypertension: Secondary | ICD-10-CM

## 2023-03-02 DIAGNOSIS — I5033 Acute on chronic diastolic (congestive) heart failure: Secondary | ICD-10-CM

## 2023-03-02 DIAGNOSIS — I4892 Unspecified atrial flutter: Secondary | ICD-10-CM | POA: Diagnosis not present

## 2023-03-02 DIAGNOSIS — I4891 Unspecified atrial fibrillation: Secondary | ICD-10-CM | POA: Diagnosis not present

## 2023-03-02 DIAGNOSIS — E876 Hypokalemia: Secondary | ICD-10-CM

## 2023-03-02 DIAGNOSIS — J449 Chronic obstructive pulmonary disease, unspecified: Secondary | ICD-10-CM

## 2023-03-02 DIAGNOSIS — E785 Hyperlipidemia, unspecified: Secondary | ICD-10-CM

## 2023-03-02 MED ORDER — METOLAZONE 2.5 MG PO TABS: 2.5000 mg | ORAL_TABLET | ORAL | 0 refills | Status: DC

## 2023-03-02 MED ORDER — TORSEMIDE 20 MG PO TABS: 80.0000 mg | ORAL_TABLET | Freq: Two times a day (BID) | ORAL | 3 refills | Status: AC

## 2023-03-02 MED ORDER — METOPROLOL TARTRATE 25 MG PO TABS: 25.0000 mg | ORAL_TABLET | Freq: Two times a day (BID) | ORAL | 1 refills | Status: DC

## 2023-03-02 MED ORDER — APIXABAN 5 MG PO TABS: 5.0000 mg | ORAL_TABLET | Freq: Two times a day (BID) | ORAL | 5 refills | Status: AC

## 2023-03-02 MED ORDER — POTASSIUM CHLORIDE CRYS ER 20 MEQ PO TBCR: 40.0000 meq | EXTENDED_RELEASE_TABLET | Freq: Every day | ORAL | 1 refills | Status: DC

## 2023-03-02 NOTE — Progress Notes (Unsigned)
Cardiology Office Note:  .   Date:  03/02/2023 ID:  Jesus Rodriguez, DOB Jan 27, 1951, MRN [id number] PCP: System, Provider Not In  [facility] Providers Cardiologist:  Dina Rich, MD    History of Present Illness: .   Jesus Rodriguez is a 72 y.o. male with a PMH of chronic diastolic CHF, A-fib and a flutter, s/p CTI ablation in 2017 (previously followed by [facility]), hypertension, hyperlipidemia, OSA on BiPAP, COPD, chronic lymphedema/stasis dermatitis, and chronic anxiety/depression, who presents today for 75-month follow-up appointment.  Last seen by Dr. Dina Rich on November 06, 2022.  Weight was up in office, patient reported increased leg edema.  His torsemide was increased to 80 mg twice daily a few days over the phone prior to office visit.  He noted leg swelling was improving, did not own home scale.  Was recommended continue torsemide 80 mg twice daily with close follow-up of labs.  Recommended to bring back for close follow-up and to consider starting SGLT2 inhibitor in setting of diastolic CHF.  Hospitalized in August 2024 for acute on chronic respiratory failure.  He had sustained a fall at home prior to arriving to the ED.  He was walking with a rollator and home oxygen tank, leg got caught in the oxygen tube and sustained a fall landing on oxygen tank with left side of chest.  It was noted he was down for about 30 to 40 minutes before help arrived.  Denied any head injury.  CT of the head was negative for anything acute.  CT of the chest was negative for PE, findings consistent with pulmonary vascular congestion and interstitial edema, bilateral pleural effusions with right basilar consolidation volume loss, possibly due to atelectasis or pneumonia.  CXR revealed pulmonary vascular congestion/cardiomegaly, possible edema/infiltrate, small to moderate bilateral pleural effusions.  Received IV diuresis.  Echocardiogram revealed preserved EF.  It was also noted he reported multiple  recurrent episodes of lower extremity cellulitis, will start on suppressive therapy with doxycycline by PCP.  Today he presents for scheduled follow-up.  He wears 4 liters of oxygen via nasal cannula. Overall doing well. Breathing is stable. Denies any chest pain, palpitations, syncope, presyncope, dizziness, orthopnea, PND, swelling or significant weight changes, acute bleeding, or claudication.  ROS: Negative. See HPI.   Studies Reviewed: .    Limited Echo 11/2022:  1. Left ventricular ejection fraction, by estimation, is 55 to 60%. The  left ventricle has normal function. The left ventricle has no regional  wall motion abnormalities.   2. Right ventricular systolic function is low normal. The right  ventricular size is moderately enlarged.   3. Limited echo evaluate LV function  Echo complete 09/2022: 1. Left ventricular ejection fraction, by estimation, is 55 to 60%. The  left ventricle has normal function. Left ventricular endocardial border  not optimally defined to evaluate regional wall motion. There is moderate  concentric left ventricular  hypertrophy. Left ventricular diastolic parameters are indeterminate.   2. Right ventricular systolic function is low normal. The right  ventricular size is mildly enlarged. There is moderately elevated  pulmonary artery systolic pressure. The estimated right ventricular  systolic pressure is 56.0 mmHg.   3. Left atrial size was mild to moderately dilated.   4. Right atrial size was moderately dilated.   5. The mitral valve is degenerative. Mild mitral valve regurgitation.   6. The aortic valve was not well visualized. There is mild calcification  of the aortic valve. Aortic valve regurgitation is not  visualized. Aortic  valve sclerosis/calcification is present, without any evidence of aortic  stenosis. Aortic valve mean  gradient measures 4.0 mmHg.   7. Aortic dilatation noted. There is mild dilatation of the aortic root,  measuring 42  mm.   8. The inferior vena cava is dilated in size with <50% respiratory  variability, suggesting right atrial pressure of 15 mmHg.   Comparison(s): Prior images reviewed side by side. LVEF normal range at  55-60%. Mild RV enlargement with low normal contraction and evidence of moderate pulmonary hypertension. Mildly dilated aortic root.  Physical Exam:   VS:  BP 120/72   Pulse 60   Ht 6\' 2"  (1.88 m)   Wt (!) 333 lb (151 kg)   SpO2 98% Comment: 4 litters  BMI 42.75 kg/m    Wt Readings from Last 3 Encounters:  03/02/23 (!) 333 lb (151 kg)  12/11/22 (!) 318 lb 11.2 oz (144.6 kg)  11/06/22 (!) 341 lb (154.7 kg)    GEN: Morbidly obese, 72 y.o. male in no acute distress NECK: No JVD; No carotid bruits CARDIAC: S1/S2, RRR, no murmurs, rubs, gallops RESPIRATORY:  Clear to auscultation without rales, wheezing or rhonchi  EXTREMITIES:  No gross edema, chronic venous stasis dermatitis; No deformity   ASSESSMENT AND PLAN: .    Chronic diastolic CHF Stage C, NYHA class I-II symptoms. EF 55-60% 11/2022. No signs of volume up on exam. Leg edema difficult to evaluate fully d/t chronic venous stasis dermatitis, no significant leg edema noted on exam. Continue current GDMT. Low sodium diet, fluid restriction <2L, and daily weights encouraged. Educated to contact our office for weight gain of 2 lbs overnight or 5 lbs in one week.  A-fib/A-flutter, s/p CTI ablation in 2017 Denies any tachycardia or palpitations. Continue current medication regimen. Continue Eliquis for stroke prevention. Denies any bleeding issues, on appropriate dosage.   3. HTN BP stable. Discussed to monitor BP at home at least 2 hours after medications and sitting for 5-10 minutes.  No medication changes at this time.   4. HLD No recent labs on file. Will request labs from PCP's office. Continue atorvastatin. Heart healthy diet encouraged.   5. COPD Denies any recent worsening SHOB. Breathing is stable. Continue current  medication regimen. Continue to f/u with Dr. [name].   6. Hypokalemia Most recent labs revealed low potassium levels. Instructed patient to restart his potassium supplement. Will obtain BMET in 1-2 weeks.       Dispo: Will provide refills per his request. Follow-up with me/APP in 2 months or sooner if anything changes.   Signed, Sharlene Dory, NP

## 2023-03-02 NOTE — Patient Instructions (Addendum)
Medication Instructions:  Your physician recommends that you continue on your current medications as directed. Please refer to the Current Medication list given to you today.  Labwork: In 2 week at [facility]   Testing/Procedures: None   Follow-Up: Your physician recommends that you schedule a follow-up appointment in: 2 Months   Any Other Special Instructions Will Be Listed Below (If Applicable).  If you need a refill on your cardiac medications before your next appointment, please call your pharmacy.

## 2023-03-03 ENCOUNTER — Other Ambulatory Visit: Payer: Self-pay | Admitting: Nurse Practitioner

## 2023-03-04 ENCOUNTER — Encounter: Payer: Self-pay | Admitting: Nurse Practitioner

## 2023-03-15 NOTE — Progress Notes (Unsigned)
Jesus Rodriguez, male    DOB: [birth year]    MRN: [id number]   Brief patient profile:  72  yowm quit smoking 12/2021 with restrictive changes only on pfts 2015 but placed on 02 /trelegy at that time  then quit and  self referred to pulmonary clinic in [facility]  10/27/2022  for 02 dep resp failure with hypercabia s/p admit with dx of copd  Baseline wt 260   Admit date: 09/28/2022 Discharge date: 10/01/2022   Brief Hospitalization Summary: Please see all hospital notes, images, labs for full details of the hospitalization. ADMISSION PROVIDER HPI:  72 y.o. male with medical history significant of chronic atrial fibrillation on Eliquis, chronic diastolic CHF, OSA on CPAP, morbid obesity, chronic hypoxia on 4 L nasal cannula at baseline, hypertension, hyperlipidemia, chronic anxiety/depression who presents to the emergency department due to 2-day onset of worsening shortness of breath.  He complained of productive cough of grayish-white sputum, he was placed on Levaquin at the nursing facility.  O2 sat was noted to be worse today, so EMS was activated and patient was sent to the ED for further evaluation and management.   r.    HOSPITAL COURSE BY PROBLEM    CAP--cough and dyspnea persist  -Treated with iv Vancomycin and Cefepime/then transitioned to CTX and azithromycin C/n  bronchodilators and mucolytics -DC on oral doxycycline to complete course    acute on chronic hypoxic respiratory failure--at baseline patient usually requires 3-1/2 to 4 L of oxygen -Currently back down to baseline 4 L of oxygen via nasal cannula      Class 2 Obesity- -Low calorie diet, portion control and increase physical activity discussed with patient -Body mass index is 39.91 kg/m.   OSA--- continue BiPAP nightly   PAfib--- continue Eliquis for stroke prophylaxis and metoprolol for rate control   HFpEF--history of chronic diastolic dysfunction CHF with EF in the 55 to 60% range -Bilateral pleural effusions  noted -BNP mildly elevated -Dyspnea on increased oxygen requirement persist -Repeat echo pending at time of discharge, follow up outpatient with PCP and pulmonary  -He was treated with IV Lasix    History of Present Illness  10/27/2022  Pulmonary/ 1st office eval/ Jesus Rodriguez / [place] [facility] re hypoxemia/hypercabia  Chief Complaint  Patient presents with   COPD    Just released from nursing home  Dyspnea:  just got of rehab one week prior to OV  p 22 days - can go  up ramp with 4lpm with rollator   Cough: none  Sleep: on bipap x 2023 per Sood  SABA use: not using hfa/ neb  02: 4lpm 24/7  Rec My office will be contacting you by phone for referral to [employer] for best fit for portable oxygen  Plan A = Automatic = Always=    Trelegy 100 one click each am Work on inhaler technique.   Plan B = Backup (to supplement plan A, not to replace it) Only use your albuterol inhaler as a rescue medication  Plan C = Crisis (instead of Plan B but only if Plan B stops working) - only use your albuterol nebulizer if you first try Plan B and it fails to help > ok to use the nebulizer up to every 4 hours but if start needing it regularly call for immediate appointment Make sure you check your oxygen saturation  AT  your highest level of activity (not after you stop)   to be sure it stays over 90% Please schedule a follow up  office visit in 6 weeks, call sooner if needed - bring 0xygen and rollator     12/08/2022  f/u ov/[place] office/Jesus Rodriguez re: 02 dep Resp failure  maint on trelegy and 4lpm  did  bring 02 (though tank empty)  and rollator Chief Complaint  Patient presents with   Acute on chronic respiratory failure with hypoxia  Dyspnea:  only with activity / comfortable at rest on 02/ not monitoring/titrating 02 with activity  Cough: mucoid/ minimal  Sleeping: Bipap per Sood  s  resp cc  SABA use: neb @ 6 h prior to OV   02: 4lpm  24/7  Rec Please keep your follow up appt with [employer] to determine best  fit for portable 02  The goal is to keep the 02 saturations in the low 90s and wear the bipap as much as you can  If condition worsens go to ER      03/16/2023  3 m f/u ov/[place] office/Jesus Rodriguez re: MO/ combined hypoxemic/hypercarbic Resp failure  maint on ***  No chief complaint on file.   Dyspnea:  *** Cough: *** Sleeping: ***   resp cc  SABA use: *** 02: ***  Lung cancer screening: ***   No obvious day to day or daytime variability or assoc excess/ purulent sputum or mucus plugs or hemoptysis or cp or chest tightness, subjective wheeze or overt sinus or hb symptoms.    Also denies any obvious fluctuation of symptoms with weather or environmental changes or other aggravating or alleviating factors except as outlined above   No unusual exposure hx or h/o childhood pna/ asthma or knowledge of premature birth.  Current Allergies, Complete Past Medical History, Past Surgical History, Family History, and Social History were reviewed in [employer] record.  ROS  The following are not active complaints unless bolded Hoarseness, sore throat, dysphagia, dental problems, itching, sneezing,  nasal congestion or discharge of excess mucus or purulent secretions, ear ache,   fever, chills, sweats, unintended wt loss or wt gain, classically pleuritic or exertional cp,  orthopnea pnd or arm/hand swelling  or leg swelling, presyncope, palpitations, abdominal pain, anorexia, nausea, vomiting, diarrhea  or change in bowel habits or change in bladder habits, change in stools or change in urine, dysuria, hematuria,  rash, arthralgias, visual complaints, headache, numbness, weakness or ataxia or problems with walking or coordination,  change in mood or  memory.        No outpatient medications have been marked as taking for the 03/16/23 encounter (Appointment) with [name], MD.             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 Similar left mid and basilar lung opacities, which could represent atelectasis, aspiration, and/or pneumonia.  2. Similar small bilateral pleural effusions. 3. Similar cardiomegaly.   Labs ordered/ reviewed:   HC03  42/ K 2.8 / hgb 10.1, BNP 101              Assessment

## 2023-03-16 ENCOUNTER — Ambulatory Visit: Payer: Medicare Other | Admitting: Internal Medicine

## 2023-04-24 ENCOUNTER — Ambulatory Visit: Payer: Medicare Other | Admitting: Internal Medicine

## 2023-05-04 ENCOUNTER — Ambulatory Visit: Payer: Medicare Other | Admitting: Nurse Practitioner

## 2023-05-14 ENCOUNTER — Ambulatory Visit: Payer: Medicare Other | Admitting: Internal Medicine

## 2023-05-19 ENCOUNTER — Other Ambulatory Visit (HOSPITAL_COMMUNITY): Payer: Self-pay

## 2023-05-19 ENCOUNTER — Telehealth: Payer: Self-pay | Admitting: Internal Medicine

## 2023-05-19 ENCOUNTER — Other Ambulatory Visit: Payer: Self-pay

## 2023-05-19 DIAGNOSIS — R0609 Other forms of dyspnea: Secondary | ICD-10-CM

## 2023-05-19 DIAGNOSIS — J9621 Acute and chronic respiratory failure with hypoxia: Secondary | ICD-10-CM

## 2023-05-19 MED ORDER — ALBUTEROL SULFATE (2.5 MG/3ML) 0.083% IN NEBU: 2.5000 mg | INHALATION_SOLUTION | RESPIRATORY_TRACT | 0 refills | Status: DC | PRN

## 2023-05-19 NOTE — Telephone Encounter (Signed)
Tried to call pt no answer , pt didn't have a vm  set up

## 2023-05-19 NOTE — Telephone Encounter (Signed)
Sent refill to [facility]

## 2023-05-19 NOTE — Telephone Encounter (Signed)
Patient states [facility] has not heard from the refill request for his   albuterol (PROVENTIL) (2.5 MG/3ML) 0.083% nebulizer solution 2.5 mg, Every 4 hours PRN   Patient call back--[phone]

## 2023-05-20 ENCOUNTER — Other Ambulatory Visit: Payer: Self-pay | Admitting: Cardiology

## 2023-05-22 ENCOUNTER — Other Ambulatory Visit (HOSPITAL_COMMUNITY): Payer: Self-pay

## 2023-05-22 ENCOUNTER — Other Ambulatory Visit: Payer: Self-pay | Admitting: Nurse Practitioner

## 2023-05-22 ENCOUNTER — Telehealth: Payer: Self-pay

## 2023-05-22 DIAGNOSIS — R0609 Other forms of dyspnea: Secondary | ICD-10-CM

## 2023-05-22 DIAGNOSIS — J9621 Acute and chronic respiratory failure with hypoxia: Secondary | ICD-10-CM

## 2023-05-22 NOTE — Telephone Encounter (Signed)
 Pharmacy Patient Advocate Encounter   Received notification from CoverMyMeds that prior authorization for Albuterol  Sulfate (2.5 MG/3ML)0.083% nebulizer solution is required/requested.   Insurance verification completed.   The patient is insured through [facility] .   Per test claim: PA required; PA submitted to above mentioned insurance via CoverMyMeds Key/confirmation #/EOC [id number] Status is pending

## 2023-05-25 MED ORDER — ALBUTEROL SULFATE (2.5 MG/3ML) 0.083% IN NEBU
2.5000 mg | INHALATION_SOLUTION | RESPIRATORY_TRACT | 2 refills | Status: AC | PRN
Start: 2023-05-25 — End: ?

## 2023-05-25 NOTE — Telephone Encounter (Signed)
 Pharmacy Patient Advocate Encounter  Received notification from [employer] [facility] [employer] that Prior Authorization for Albuterol  Sulfate (2.5 MG/3ML)0.083% nebulizer solution has been DENIED.  Full denial letter will be uploaded to the media tab. See denial reason below.  We denied coverage for this drug because: Drugs used in a nebulizer in your home are covered under Medicare Part B. Since you are using this medication with a nebulizer in your home, it is covered under Medicare Part B per Section 186(n) of the Social Security Act. Your Medicare Part D drug plan cannot cover a drug already covered by Medicare Part B.   PA #/Case ID/Reference #: [id number]

## 2023-05-25 NOTE — Addendum Note (Signed)
 Addended by: Katie Parks A on: 05/25/2023 04:57 PM   Modules accepted: Orders

## 2023-05-25 NOTE — Telephone Encounter (Signed)
 Pharmacy should have filed under part B medicare and if they can't or won't then ask [facility] to take over all her nebulizer meds

## 2023-05-25 NOTE — Telephone Encounter (Signed)
 MW, please see below message. Would you like to submit an appeal?

## 2023-05-25 NOTE — Telephone Encounter (Addendum)
 Spoke to [place] with [employer] and made her aware that albuterol  solution needed to be filed under part B.  She stated that Rx on file was expired. New Rx has been sent. Nothing further needed.

## 2023-06-01 ENCOUNTER — Other Ambulatory Visit: Payer: Self-pay | Admitting: Nurse Practitioner

## 2023-06-01 ENCOUNTER — Telehealth: Payer: Self-pay | Admitting: Nurse Practitioner

## 2023-06-01 NOTE — Telephone Encounter (Signed)
ERROR

## 2023-06-02 IMAGING — DX DG CHEST 1V PORT
1 series · 1 of 1 positions shown · non-contrast
Comparison: Chest x-ray dated September 19, 2021

CLINICAL DATA: Shortness of breath

EXAM:
PORTABLE CHEST 1 VIEW

[chest ap grid]
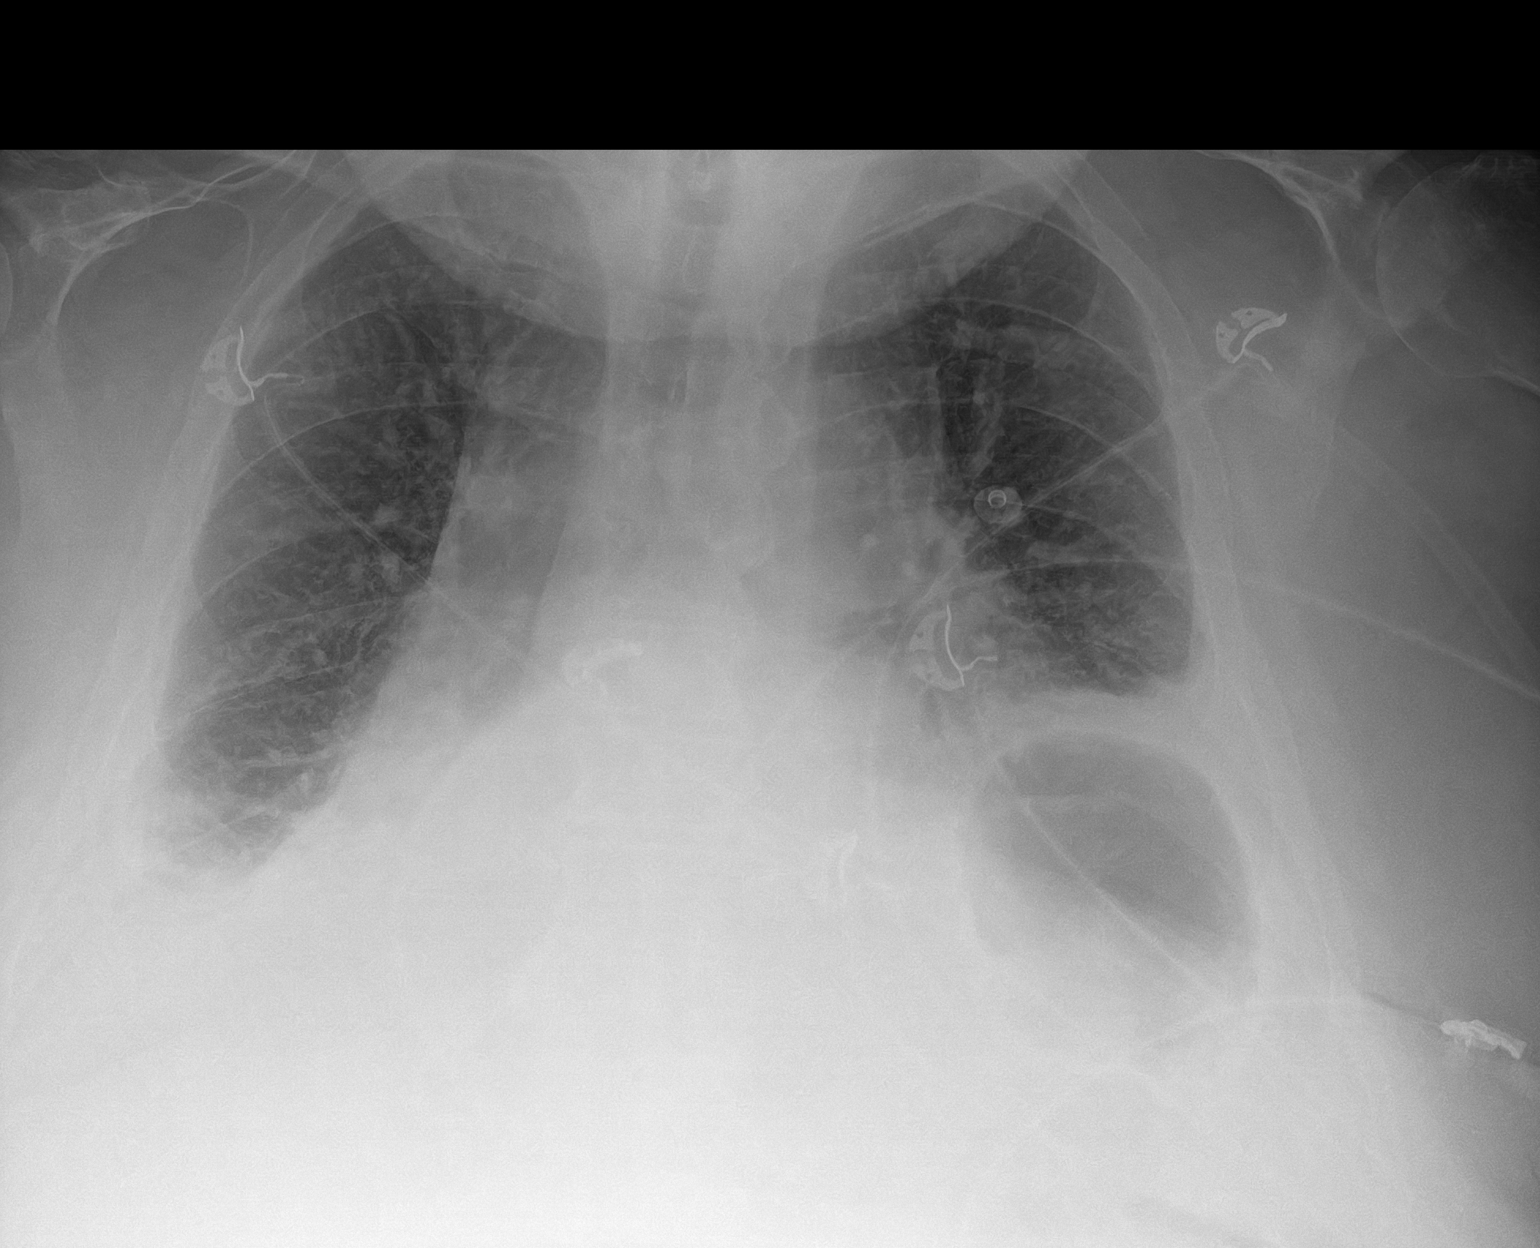

[1 of 1 positions shown; findings below may reference images not displayed]

FINDINGS: Cardiac and mediastinal contours are unchanged. Unchanged bibasilar
consolidations. Possible small bilateral pleural effusions. No
evidence of pneumothorax.
IMPRESSION: Unchanged bibasilar consolidations with possible associated small
pleural effusions.

## 2023-06-09 NOTE — Progress Notes (Deleted)
 Jesus Rodriguez, male    DOB: 1950/07/13    MRN: [id number]   Brief patient profile:  40  yowm quit smoking 12/2021 with restrictive changes only on pfts 2015 but placed on 02 /trelegy at that time  then quit and  self referred to pulmonary clinic in [facility]  10/27/2022  for 02 dep resp failure with hypercabia s/p admit with dx of copd  Baseline wt 260   Admit date: 09/28/2022 Discharge date: 10/01/2022   Brief Hospitalization Summary: Please see all hospital notes, images, labs for full details of the hospitalization. ADMISSION PROVIDER HPI:  73 y.o. male with medical history significant of chronic atrial fibrillation on Eliquis, chronic diastolic CHF, OSA on CPAP, morbid obesity, chronic hypoxia on 4 L nasal cannula at baseline, hypertension, hyperlipidemia, chronic anxiety/depression who presents to the emergency department due to 2-day onset of worsening shortness of breath.  He complained of productive cough of grayish-white sputum, he was placed on Levaquin at the nursing facility.  O2 sat was noted to be worse today, so EMS was activated and patient was sent to the ED for further evaluation and management.   r.    HOSPITAL COURSE BY PROBLEM    CAP--cough and dyspnea persist  -Treated with iv Vancomycin and Cefepime/then transitioned to CTX and azithromycin C/n  bronchodilators and mucolytics -DC on oral doxycycline to complete course    acute on chronic hypoxic respiratory failure--at baseline patient usually requires 3-1/2 to 4 L of oxygen -Currently back down to baseline 4 L of oxygen via nasal cannula      Class 2 Obesity- -Low calorie diet, portion control and increase physical activity discussed with patient -Body mass index is 39.91 kg/m.   OSA--- continue BiPAP nightly   PAfib--- continue Eliquis for stroke prophylaxis and metoprolol for rate control   HFpEF--history of chronic diastolic dysfunction CHF with EF in the 55 to 60% range -Bilateral pleural effusions  noted -BNP mildly elevated -Dyspnea on increased oxygen requirement persist -Repeat echo pending at time of discharge, follow up outpatient with PCP and pulmonary  -He was treated with IV Lasix    History of Present Illness  10/27/2022  Pulmonary/ 1st office eval/ [name] / [name] re hypoxemia/hypercabia  Chief Complaint  Patient presents with   COPD    Just released from nursing home  Dyspnea:  just got of rehab one week prior to OV  p 22 days - can go  up ramp with 4lpm with rollator   Cough: none  Sleep: on bipap x 2023 per Sood  SABA use: not using hfa/ neb  02: 4lpm 24/7  Rec My office will be contacting you by phone for referral to [employer] for best fit for portable oxygen  Plan A = Automatic = Always=    Trelegy 100 one click each am Work on inhaler technique.   Plan B = Backup (to supplement plan A, not to replace it) Only use your albuterol inhaler as a rescue medication  Plan C = Crisis (instead of Plan B but only if Plan B stops working) - only use your albuterol nebulizer if you first try Plan B and it fails to help > ok to use the nebulizer up to every 4 hours but if start needing it regularly call for immediate appointment Make sure you check your oxygen saturation  AT  your highest level of activity (not after you stop)   to be sure it stays over 90% Please schedule a follow up  office visit in 6 weeks, call sooner if needed - bring 0xygen and rollator     12/08/2022  f/u ov/[name]/[name] re: 02 dep Resp failure  maint on trelegy and 4lpm  did  bring 02 (though tank empty)  and rollator Chief Complaint  Patient presents with   Acute on chronic respiratory failure with hypoxia  Dyspnea:  only with activity / comfortable at rest on 02/ not monitoring/titrating 02 with activity  Cough: mucoid/ minimal  Sleeping: Bipap per Sood  s  resp cc  SABA use: neb @ 6 h prior to OV   02: 4lpm  24/7  Rec Please keep your follow up appt with [employer] to determine best  fit for portable 02  The goal is to keep the 02 saturations in the low 90s and wear the bipap as much as you can   Please schedule a follow up visit in 3 months but call sooner if needed      06/10/2023  f/u ov/[name]/[name] re: *** maint on ***  No chief complaint on file.   Dyspnea:  *** Cough: *** Sleeping: ***   resp cc  SABA use: *** 02: ***  Lung cancer screening: ***   No obvious day to day or daytime variability or assoc excess/ purulent sputum or mucus plugs or hemoptysis or cp or chest tightness, subjective wheeze or overt sinus or hb symptoms.    Also denies any obvious fluctuation of symptoms with weather or environmental changes or other aggravating or alleviating factors except as outlined above   No unusual exposure hx or h/o childhood pna/ asthma or knowledge of premature birth.  Current Allergies, Complete Past Medical History, Past Surgical History, Family History, and Social History were reviewed in [employer] record.  ROS  The following are not active complaints unless bolded Hoarseness, sore throat, dysphagia, dental problems, itching, sneezing,  nasal congestion or discharge of excess mucus or purulent secretions, ear ache,   fever, chills, sweats, unintended wt loss or wt gain, classically pleuritic or exertional cp,  orthopnea pnd or arm/hand swelling  or leg swelling, presyncope, palpitations, abdominal pain, anorexia, nausea, vomiting, diarrhea  or change in bowel habits or change in bladder habits, change in stools or change in urine, dysuria, hematuria,  rash, arthralgias, visual complaints, headache, numbness, weakness or ataxia or problems with walking or coordination,  change in mood or  memory.        No outpatient medications have been marked as taking for the 06/10/23 encounter (Appointment) with [name], MD.                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

## 2023-06-10 ENCOUNTER — Ambulatory Visit: Payer: Medicare Other | Admitting: Internal Medicine

## 2023-06-18 NOTE — Progress Notes (Deleted)
 Cardiology Office Note:  .   Date:  06/18/2023  ID:  Jesus Rodriguez, DOB 04-Dec-1950, MRN [id number] PCP: System, Provider Not In  [facility] Providers Cardiologist:  Dina Rich, MD { Click to update primary MD,subspecialty MD or APP then REFRESH:1}   History of Present Illness: .   Jesus Rodriguez is a 73 y.o. male with a PMH of chronic diastolic CHF, A-fib and a flutter, s/p CTI ablation in 2017 (previously followed by [facility]), hypertension, hyperlipidemia, OSA on BiPAP, COPD, chronic lymphedema/stasis dermatitis, and chronic anxiety/depression.  Patient went to [facility] with SOB 06/19/22 and Hbg 6.5 s/p transfusion and diuresis. Transferred to [facility] with acute on chronic resp failure with hypoxia and hypercapnia, s/p thoracentesis 1L removed. Was too unstable from resp standpoint to do endo/colon. Echo EF 55-60% severly elevated RVSP   ROS: ***  Studies Reviewed: Marland Kitchen         Prior CV Studies: {Select studies to display:26339}   06/22/2023 10:50 AM EDT [facility] This result has an attachment that is not available.   Left Ventricle Left ventricle size is normal. There is mild hypertrophy. Systolic function is normal. EF: 55-60%. Wall motion is normal. Doppler parameters are indeterminate for diastolic function.  Right Ventricle Right ventricle is mildly dilated. Systolic function is normal. Normal tricuspid annular plane systolic excursion (TAPSE) >1.7 cm.  Left Atrium Left atrium volume index is mildly increased (35-41 mL/m2).  Right Atrium Right atrium is mildly dilated.  IVC/SVC The inferior vena cava demonstrates a diameter of >2.1 cm and collapses <50%; therefore, the right atrial pressure is estimated at 15 mmHg.  Mitral Valve Mitral valve structure is normal. The leaflets are mildly thickened. There is mild regurgitation.  Tricuspid Valve Tricuspid valve structure is normal. There is trace regurgitation. The right ventricular systolic pressure is severely  elevated (>60 mmHg).  Aortic Valve The aortic valve is tricuspid. The leaflets are moderately thickened and exhibit normal excursion. There is no regurgitation or stenosis.  Pulmonic Valve The pulmonic valve was not well visualized. Trace regurgitation. There is no evidence of pulmonic valve stenosis.  Ascending Aorta The aortic root is normal in size. The ascending aorta is normal in size.  Pericardium There is no pericardial effusion.  Study Details A complete echo was performed using complete 2D, color flow Doppler and spectral Doppler. During the study the apical, parasternal, subcostal and suprasternal views were captured. Definity contrast was injected during the study. The study was technically difficult. The study was difficult due to patient's body habitus. The imaging is on file and stored in a permanent location.      Limited Echo 11/2022:  1. Left ventricular ejection fraction, by estimation, is 55 to 60%. The  left ventricle has normal function. The left ventricle has no regional  wall motion abnormalities.   2. Right ventricular systolic function is low normal. The right  ventricular size is moderately enlarged.   3. Limited echo evaluate LV function   Echo complete 09/2022: 1. Left ventricular ejection fraction, by estimation, is 55 to 60%. The  left ventricle has normal function. Left ventricular endocardial border  not optimally defined to evaluate regional wall motion. There is moderate  concentric left ventricular  hypertrophy. Left ventricular diastolic parameters are indeterminate.   2. Right ventricular systolic function is low normal. The right  ventricular size is mildly enlarged. There is moderately elevated  pulmonary artery systolic pressure. The estimated right ventricular  systolic pressure is 56.0 mmHg.   3. Left  atrial size was mild to moderately dilated.   4. Right atrial size was moderately dilated.   5. The mitral valve is degenerative. Mild  mitral valve regurgitation.   6. The aortic valve was not well visualized. There is mild calcification  of the aortic valve. Aortic valve regurgitation is not visualized. Aortic  valve sclerosis/calcification is present, without any evidence of aortic  stenosis. Aortic valve mean  gradient measures 4.0 mmHg.   7. Aortic dilatation noted. There is mild dilatation of the aortic root,  measuring 42 mm.   8. The inferior vena cava is dilated in size with <50% respiratory  variability, suggesting right atrial pressure of 15 mmHg.   Comparison(s): Prior images reviewed side by side. LVEF normal range at  55-60%. Mild RV enlargement with low normal contraction and evidence of moderate pulmonary hypertension. Mildly dilated aortic root.  Risk Assessment/Calculations:   {Does this patient have ATRIAL FIBRILLATION?:[id number]} No BP recorded.  {Refresh Note OR Click here to enter BP  :1}***       Physical Exam:   VS:  There were no vitals taken for this visit.   Wt Readings from Last 3 Encounters:  03/02/23 (!) 333 lb (151 kg)  12/11/22 (!) 318 lb 11.2 oz (144.6 kg)  11/06/22 (!) 341 lb (154.7 kg)    GEN: Well nourished, well developed in no acute distress NECK: No JVD; No carotid bruits CARDIAC: ***RRR, no murmurs, rubs, gallops RESPIRATORY:  Clear to auscultation without rales, wheezing or rhonchi  ABDOMEN: Soft, non-tender, non-distended EXTREMITIES:  No edema; No deformity   ASSESSMENT AND PLAN: .    Chronic diastolic CHF Stage C, NYHA class I-II symptoms. EF 55-60% 11/2022. No signs of volume up on exam. Leg edema difficult to evaluate fully d/t chronic venous stasis dermatitis, no significant leg edema noted on exam. Continue current GDMT. Low sodium diet, fluid restriction <2L, and daily weights encouraged. Educated to contact our office for weight gain of 2 lbs overnight or 5 lbs in one week.   A-fib/A-flutter, s/p CTI ablation in 2017 Denies any tachycardia or palpitations.  Continue current medication regimen. Continue Eliquis for stroke prevention. Denies any bleeding issues, on appropriate dosage.     HTN BP stable. Discussed to monitor BP at home at least 2 hours after medications and sitting for 5-10 minutes.  No medication changes at this time.     HLD No recent labs on file. Will request labs from PCP's office. Continue atorvastatin. Heart healthy diet encouraged.     COPD Denies any recent worsening SHOB. Breathing is stable. Continue current medication regimen. Continue to f/u with Dr. [name].    Hypokalemia Most recent labs revealed low potassium levels. Instructed patient to restart his potassium supplement. Will obtain BMET in 1-2 weeks.           {Are you ordering a CV Procedure (e.g. stress test, cath, DCCV, TEE, etc)?   Press F2        :[id number]}  Dispo: ***  Signed, Jacolyn Reedy, PA-C

## 2023-06-23 NOTE — Progress Notes (Deleted)
 Jesus Rodriguez, male    DOB: 13-Jun-1950    MRN: [id number]   Brief patient profile:  26  yowm quit smoking 12/2021 with restrictive changes only on pfts 2015 but placed on 02 /trelegy at that time  then quit and  self referred to pulmonary clinic in [facility]  10/27/2022  for 02 dep resp failure with hypercabia s/p admit with dx of copd  Baseline wt 260   Admit date: 09/28/2022 Discharge date: 10/01/2022   Brief Hospitalization Summary: Please see all hospital notes, images, labs for full details of the hospitalization. ADMISSION PROVIDER HPI:  73 y.o. male with medical history significant of chronic atrial fibrillation on Eliquis, chronic diastolic CHF, OSA on CPAP, morbid obesity, chronic hypoxia on 4 L nasal cannula at baseline, hypertension, hyperlipidemia, chronic anxiety/depression who presents to the emergency department due to 2-day onset of worsening shortness of breath.  He complained of productive cough of grayish-white sputum, he was placed on Levaquin at the nursing facility.  O2 sat was noted to be worse today, so EMS was activated and patient was sent to the ED for further evaluation and management.   r.    HOSPITAL COURSE BY PROBLEM    CAP--cough and dyspnea persist  -Treated with iv Vancomycin and Cefepime/then transitioned to CTX and azithromycin C/n  bronchodilators and mucolytics -DC on oral doxycycline to complete course    acute on chronic hypoxic respiratory failure--at baseline patient usually requires 3-1/2 to 4 L of oxygen -Currently back down to baseline 4 L of oxygen via nasal cannula      Class 2 Obesity- -Low calorie diet, portion control and increase physical activity discussed with patient -Body mass index is 39.91 kg/m.   OSA--- continue BiPAP nightly   PAfib--- continue Eliquis for stroke prophylaxis and metoprolol for rate control   HFpEF--history of chronic diastolic dysfunction CHF with EF in the 55 to 60% range -Bilateral pleural effusions  noted -BNP mildly elevated -Dyspnea on increased oxygen requirement persist -Repeat echo pending at time of discharge, follow up outpatient with PCP and pulmonary  -He was treated with IV Lasix    History of Present Illness  10/27/2022  Pulmonary/ 1st office eval/ Jesus Rodriguez / [place] [facility] re hypoxemia/hypercabia  Chief Complaint  Patient presents with   COPD    Just released from nursing home  Dyspnea:  just got of rehab one week prior to OV  p 22 days - can go  up ramp with 4lpm with rollator   Cough: none  Sleep: on bipap x 2023 per Sood  SABA use: not using hfa/ neb  02: 4lpm 24/7  Rec My office will be contacting you by phone for referral to [employer] for best fit for portable oxygen  Plan A = Automatic = Always=    Trelegy 100 one click each am Work on inhaler technique.   Plan B = Backup (to supplement plan A, not to replace it) Only use your albuterol inhaler as a rescue medication  Plan C = Crisis (instead of Plan B but only if Plan B stops working) - only use your albuterol nebulizer if you first try Plan B and it fails to help > ok to use the nebulizer up to every 4 hours but if start needing it regularly call for immediate appointment Make sure you check your oxygen saturation  AT  your highest level of activity (not after you stop)   to be sure it stays over 90% Please schedule a follow up  office visit in 6 weeks, call sooner if needed - bring 0xygen and rollator     12/08/2022  f/u ov/[place] office/Jesus Rodriguez re: 02 dep Resp failure  maint on trelegy and 4lpm  did  bring 02 (though tank empty)  and rollator Chief Complaint  Patient presents with   Acute on chronic respiratory failure with hypoxia  Dyspnea:  only with activity / comfortable at rest on 02/ not monitoring/titrating 02 with activity  Cough: mucoid/ minimal  Sleeping: Bipap per Sood  s  resp cc  SABA use: neb @ 6 h prior to OV   02: 4lpm  24/7  Rec Please keep your follow up appt with [employer] to determine best  fit for portable 02  The goal is to keep the 02 saturations in the low 90s and wear the bipap as much as you can   Please schedule a follow up visit in 3 months but call sooner if needed      06/24/2023  f/u ov/[place] office/Jesus Rodriguez re: *** maint on ***  No chief complaint on file.   Dyspnea:  *** Cough: *** Sleeping: ***   resp cc  SABA use: *** 02: ***  Lung cancer screening: ***   No obvious day to day or daytime variability or assoc excess/ purulent sputum or mucus plugs or hemoptysis or cp or chest tightness, subjective wheeze or overt sinus or hb symptoms.    Also denies any obvious fluctuation of symptoms with weather or environmental changes or other aggravating or alleviating factors except as outlined above   No unusual exposure hx or h/o childhood pna/ asthma or knowledge of premature birth.  Current Allergies, Complete Past Medical History, Past Surgical History, Family History, and Social History were reviewed in [employer] record.  ROS  The following are not active complaints unless bolded Hoarseness, sore throat, dysphagia, dental problems, itching, sneezing,  nasal congestion or discharge of excess mucus or purulent secretions, ear ache,   fever, chills, sweats, unintended wt loss or wt gain, classically pleuritic or exertional cp,  orthopnea pnd or arm/hand swelling  or leg swelling, presyncope, palpitations, abdominal pain, anorexia, nausea, vomiting, diarrhea  or change in bowel habits or change in bladder habits, change in stools or change in urine, dysuria, hematuria,  rash, arthralgias, visual complaints, headache, numbness, weakness or ataxia or problems with walking or coordination,  change in mood or  memory.        No outpatient medications have been marked as taking for the 06/24/23 encounter (Appointment) with [name], MD.                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

## 2023-06-24 ENCOUNTER — Ambulatory Visit: Payer: Medicare Other | Admitting: Internal Medicine

## 2023-06-24 ENCOUNTER — Encounter: Payer: Self-pay | Admitting: Internal Medicine

## 2023-06-29 ENCOUNTER — Encounter: Payer: Self-pay | Admitting: Physician Assistant

## 2023-06-29 ENCOUNTER — Ambulatory Visit: Payer: Medicare Other | Attending: Physician Assistant | Admitting: Physician Assistant

## 2023-07-15 ENCOUNTER — Telehealth: Payer: Self-pay | Admitting: *Deleted

## 2023-07-15 NOTE — Telephone Encounter (Signed)
 PATIENT HAS BEEN DISMISSED FROM [facility]    Copied from CRM [id number]. Topic: Appointments - Scheduling Inquiry for Clinic >> Jul 15, 2023  2:05 PM Nyra Capes wrote: Reason for CRM:  [facility]  phone # [phone] scheduling appt for hospital follow up from short term memory care discharge date 08/03/23

## 2023-07-16 ENCOUNTER — Ambulatory Visit: Payer: Medicare Other | Admitting: Nurse Practitioner

## 2023-09-06 ENCOUNTER — Other Ambulatory Visit: Payer: Self-pay | Admitting: Nurse Practitioner

## 2023-09-08 ENCOUNTER — Ambulatory Visit: Admitting: Nurse Practitioner

## 2023-09-15 ENCOUNTER — Ambulatory Visit: Attending: Nurse Practitioner | Admitting: Nurse Practitioner

## 2023-09-15 NOTE — Progress Notes (Deleted)
 Cardiology Office Note:  .   Date:  03/02/2023 ID:  [name], DOB March 18, 1951, MRN [id number] PCP: System, Provider Not In  [facility] Providers Cardiologist:  Jesus Lander, MD    History of Present Illness: .   Jesus Rodriguez is a 73 y.o. male with a PMH of chronic diastolic CHF, A-fib and a flutter, s/p CTI ablation in 2017 (previously followed by [facility]), hypertension, hyperlipidemia, OSA on BiPAP, COPD, chronic lymphedema/stasis dermatitis, and chronic anxiety/depression, who presents today for 3-month follow-up appointment.  Last seen by Dr. Armida Rodriguez on November 06, 2022.  Weight was up in office, patient reported increased leg edema.  His torsemide  was increased to 80 mg twice daily a few days over the phone prior to office visit.  He noted leg swelling was improving, did not own home scale.  Was recommended continue torsemide  80 mg twice daily with close follow-up of labs.  Recommended to bring back for close follow-up and to consider starting SGLT2 inhibitor in setting of diastolic CHF.  Hospitalized in August 2024 for acute on chronic respiratory failure.  He had sustained a fall at home prior to arriving to the ED.  He was walking with a rollator and home oxygen tank, leg got caught in the oxygen tube and sustained a fall landing on oxygen tank with left side of chest.  It was noted he was down for about 30 to 40 minutes before help arrived.  Denied any head injury.  CT of the head was negative for anything acute.  CT of the chest was negative for PE, findings consistent with pulmonary vascular congestion and interstitial edema, bilateral pleural effusions with right basilar consolidation volume loss, possibly due to atelectasis or pneumonia.  CXR revealed pulmonary vascular congestion/cardiomegaly, possible edema/infiltrate, small to moderate bilateral pleural effusions.  Received IV diuresis.  Echocardiogram revealed preserved EF.  It was also noted he reported multiple  recurrent episodes of lower extremity cellulitis, will start on suppressive therapy with doxycycline  by PCP.  Today he presents for scheduled follow-up.  He wears 4 liters of oxygen via nasal cannula. Overall doing well. Breathing is stable. Denies any chest pain, palpitations, syncope, presyncope, dizziness, orthopnea, PND, swelling or significant weight changes, acute bleeding, or claudication.  ROS: Negative. See HPI.   Studies Reviewed: .    Limited Echo 11/2022:  1. Left ventricular ejection fraction, by estimation, is 55 to 60%. The  left ventricle has normal function. The left ventricle has no regional  wall motion abnormalities.   2. Right ventricular systolic function is low normal. The right  ventricular size is moderately enlarged.   3. Limited echo evaluate LV function  Echo complete 09/2022: 1. Left ventricular ejection fraction, by estimation, is 55 to 60%. The  left ventricle has normal function. Left ventricular endocardial border  not optimally defined to evaluate regional wall motion. There is moderate  concentric left ventricular  hypertrophy. Left ventricular diastolic parameters are indeterminate.   2. Right ventricular systolic function is low normal. The right  ventricular size is mildly enlarged. There is moderately elevated  pulmonary artery systolic pressure. The estimated right ventricular  systolic pressure is 56.0 mmHg.   3. Left atrial size was mild to moderately dilated.   4. Right atrial size was moderately dilated.   5. The mitral valve is degenerative. Mild mitral valve regurgitation.   6. The aortic valve was not well visualized. There is mild calcification  of the aortic valve. Aortic valve regurgitation is not  visualized. Aortic  valve sclerosis/calcification is present, without any evidence of aortic  stenosis. Aortic valve mean  gradient measures 4.0 mmHg.   7. Aortic dilatation noted. There is mild dilatation of the aortic root,  measuring 42  mm.   8. The inferior vena cava is dilated in size with <50% respiratory  variability, suggesting right atrial pressure of 15 mmHg.   Comparison(s): Prior images reviewed side by side. LVEF normal range at  55-60%. Mild RV enlargement with low normal contraction and evidence of moderate pulmonary hypertension. Mildly dilated aortic root.  Physical Exam:   VS:  There were no vitals taken for this visit.   Wt Readings from Last 3 Encounters:  03/02/23 (!) 333 lb (151 kg)  12/11/22 (!) 318 lb 11.2 oz (144.6 kg)  11/06/22 (!) 341 lb (154.7 kg)    GEN: Morbidly obese, 73 y.o. male in no acute distress NECK: No JVD; No carotid bruits CARDIAC: S1/S2, RRR, no murmurs, rubs, gallops RESPIRATORY:  Clear to auscultation without rales, wheezing or rhonchi  EXTREMITIES:  No gross edema, chronic venous stasis dermatitis; No deformity   ASSESSMENT AND PLAN: .    Chronic diastolic CHF Stage C, NYHA class I-II symptoms. EF 55-60% 11/2022. No signs of volume up on exam. Leg edema difficult to evaluate fully d/t chronic venous stasis dermatitis, no significant leg edema noted on exam. Continue current GDMT. Low sodium diet, fluid restriction <2L, and daily weights encouraged. Educated to contact our office for weight gain of 2 lbs overnight or 5 lbs in one week.  A-fib/A-flutter, s/p CTI ablation in 2017 Denies any tachycardia or palpitations. Continue current medication regimen. Continue Eliquis  for stroke prevention. Denies any bleeding issues, on appropriate dosage.   3. HTN BP stable. Discussed to monitor BP at home at least 2 hours after medications and sitting for 5-10 minutes.  No medication changes at this time.   4. HLD No recent labs on file. Will request labs from PCP's office. Continue atorvastatin . Heart healthy diet encouraged.   5. COPD Denies any recent worsening SHOB. Breathing is stable. Continue current medication regimen. Continue to f/u with Dr. Waymond Hailey.   6. Hypokalemia Most  recent labs revealed low potassium levels. Instructed patient to restart his potassium supplement. Will obtain BMET in 1-2 weeks.       Dispo: Will provide refills per his request. Follow-up with me/APP in 2 months or sooner if anything changes.   Signed, Lasalle Pointer, NP

## 2023-10-13 DEATH — deceased
